# Patient Record
Sex: Female | Born: 1935 | Race: White | Hispanic: No | Smoking: Never smoker
Health system: Southern US, Community
[De-identification: ages and names within clinical notes are randomized; demographics above are authoritative.]

## PROBLEM LIST (undated history)

## (undated) DIAGNOSIS — K219 Gastro-esophageal reflux disease without esophagitis: Secondary | ICD-10-CM

## (undated) DIAGNOSIS — H35039 Hypertensive retinopathy, unspecified eye: Secondary | ICD-10-CM

## (undated) DIAGNOSIS — I639 Cerebral infarction, unspecified: Secondary | ICD-10-CM

## (undated) DIAGNOSIS — S329XXA Fracture of unspecified parts of lumbosacral spine and pelvis, initial encounter for closed fracture: Secondary | ICD-10-CM

## (undated) DIAGNOSIS — D649 Anemia, unspecified: Secondary | ICD-10-CM

## (undated) DIAGNOSIS — M419 Scoliosis, unspecified: Secondary | ICD-10-CM

## (undated) DIAGNOSIS — I341 Nonrheumatic mitral (valve) prolapse: Secondary | ICD-10-CM

## (undated) DIAGNOSIS — M810 Age-related osteoporosis without current pathological fracture: Secondary | ICD-10-CM

## (undated) DIAGNOSIS — E785 Hyperlipidemia, unspecified: Secondary | ICD-10-CM

## (undated) DIAGNOSIS — I10 Essential (primary) hypertension: Secondary | ICD-10-CM

## (undated) DIAGNOSIS — K589 Irritable bowel syndrome without diarrhea: Secondary | ICD-10-CM

## (undated) HISTORY — DX: Nonrheumatic mitral (valve) prolapse: I34.1

## (undated) HISTORY — DX: Hyperlipidemia, unspecified: E78.5

## (undated) HISTORY — DX: Gastro-esophageal reflux disease without esophagitis: K21.9

## (undated) HISTORY — PX: ROTATOR CUFF REPAIR: SHX139

## (undated) HISTORY — DX: Irritable bowel syndrome, unspecified: K58.9

## (undated) HISTORY — PX: EYE SURGERY: SHX253

## (undated) HISTORY — DX: Age-related osteoporosis without current pathological fracture: M81.0

## (undated) HISTORY — DX: Scoliosis, unspecified: M41.9

## (undated) HISTORY — PX: APPENDECTOMY: SHX54

---

## 1963-11-14 HISTORY — PX: ABDOMINAL HYSTERECTOMY: SHX81

## 1979-11-14 HISTORY — PX: BILATERAL OOPHORECTOMY: SHX1221

## 1999-08-29 ENCOUNTER — Other Ambulatory Visit: Admission: RE | Admit: 1999-08-29 | Discharge: 1999-08-29 | Payer: Self-pay | Admitting: Cardiology

## 2004-05-17 ENCOUNTER — Other Ambulatory Visit: Admission: RE | Admit: 2004-05-17 | Discharge: 2004-05-17 | Payer: Self-pay | Admitting: *Deleted

## 2010-09-14 ENCOUNTER — Ambulatory Visit: Payer: Self-pay | Admitting: Cardiology

## 2011-04-17 ENCOUNTER — Other Ambulatory Visit: Payer: Self-pay | Admitting: Cardiology

## 2011-04-17 DIAGNOSIS — E785 Hyperlipidemia, unspecified: Secondary | ICD-10-CM

## 2011-04-17 DIAGNOSIS — I119 Hypertensive heart disease without heart failure: Secondary | ICD-10-CM

## 2011-04-17 DIAGNOSIS — Z79899 Other long term (current) drug therapy: Secondary | ICD-10-CM

## 2011-04-17 MED ORDER — SIMVASTATIN 20 MG PO TABS: 20.0000 mg | ORAL_TABLET | Freq: Every day | ORAL | Status: DC

## 2011-04-17 MED ORDER — NADOLOL 80 MG PO TABS: 80.0000 mg | ORAL_TABLET | Freq: Every day | ORAL | Status: DC

## 2011-04-17 NOTE — Telephone Encounter (Signed)
NEEDS SCRIPT FOR ZORCOR AND CORGARD - GENERIC FOR BOTH- WANTS TO PICK UP SCRIPTS. CALL WHEN READY

## 2011-05-15 ENCOUNTER — Encounter: Payer: Self-pay | Admitting: Cardiology

## 2011-05-24 ENCOUNTER — Encounter: Payer: Self-pay | Admitting: Cardiology

## 2011-05-24 ENCOUNTER — Other Ambulatory Visit (INDEPENDENT_AMBULATORY_CARE_PROVIDER_SITE_OTHER): Payer: Medicare Other | Admitting: *Deleted

## 2011-05-24 ENCOUNTER — Ambulatory Visit (INDEPENDENT_AMBULATORY_CARE_PROVIDER_SITE_OTHER): Payer: Medicare Other | Admitting: Cardiology

## 2011-05-24 DIAGNOSIS — E785 Hyperlipidemia, unspecified: Secondary | ICD-10-CM

## 2011-05-24 DIAGNOSIS — R5381 Other malaise: Secondary | ICD-10-CM

## 2011-05-24 DIAGNOSIS — I059 Rheumatic mitral valve disease, unspecified: Secondary | ICD-10-CM

## 2011-05-24 DIAGNOSIS — I341 Nonrheumatic mitral (valve) prolapse: Secondary | ICD-10-CM | POA: Insufficient documentation

## 2011-05-24 DIAGNOSIS — Z79899 Other long term (current) drug therapy: Secondary | ICD-10-CM

## 2011-05-24 DIAGNOSIS — E7800 Pure hypercholesterolemia, unspecified: Secondary | ICD-10-CM

## 2011-05-24 LAB — BASIC METABOLIC PANEL
Calcium: 9.2 mg/dL (ref 8.4–10.5)
GFR: 107.68 mL/min (ref >60.00)
Glucose, Bld: 101 mg/dL — ABNORMAL HIGH (ref 70–99)
Sodium: 140 mEq/L (ref 135–145)

## 2011-05-24 LAB — LIPID PANEL
HDL: 87.5 mg/dL (ref >39.00)
Total CHOL/HDL Ratio: 2
VLDL: 11.2 mg/dL (ref 0.0–40.0)

## 2011-05-24 LAB — HEPATIC FUNCTION PANEL
Albumin: 4.6 g/dL (ref 3.5–5.2)
Alkaline Phosphatase: 61 U/L (ref 39–117)

## 2011-05-24 LAB — CBC WITH DIFFERENTIAL/PLATELET
Eosinophils Relative: 0.6 % (ref 0.0–5.0)
HCT: 39.6 % (ref 36.0–46.0)
Hemoglobin: 13.5 g/dL (ref 12.0–15.0)
Lymphs Abs: 2 10*3/uL (ref 0.7–4.0)
Monocytes Relative: 7.1 % (ref 3.0–12.0)
Neutro Abs: 4.6 10*3/uL (ref 1.4–7.7)
RDW: 14.6 % (ref 11.5–14.6)
WBC: 7.2 10*3/uL (ref 4.5–10.5)

## 2011-05-24 NOTE — Assessment & Plan Note (Signed)
The patient has a history of hypercholesterolemia.  She is on low dose simvastatin20 mg daily.  She is not having any problems or myalgias or other side effects from the statins.  She is chronically thin and does not over eat.

## 2011-05-24 NOTE — Progress Notes (Signed)
Akayla A Savin Date of Birth:  01/26/1934 [facility] [address]   [address] [place], [place]  [address] [phone]           Fax   [phone]  HPI: This pleasant 34 woman is seen for a followup office visit.  She has a history of mitral valve prolapse and history of hypercholesterolemia.  She also has a history of osteoporosis.  She previously took Fosamax but not recently.  She had a fall in December 2011 and unfortunately did not break any bones.  From a cardiac standpoint she's having no chest pain or shortness of breath or palpitations.  She does not have any history of ischemic heart disease.  She has not been experiencing any side effects from her statin therapy.  Current Outpatient Prescriptions  Medication Sig Dispense Refill  . aspirin 81 MG EC tablet Take 81 mg by mouth daily.        . Calcium Carb-Cholecalciferol (CALCIUM 500 +D PO) Take by mouth 2 (two) times daily.        . nadolol (CORGARD) 80 MG tablet Take 1 tablet (80 mg total) by mouth daily.  90 tablet  3  . simvastatin (ZOCOR) 20 MG tablet Take 1 tablet (20 mg total) by mouth daily.  90 tablet  3    Allergies  Allergen Reactions  . Corgard (Nadolol)   . Penicillins     Yeast infections  . Phenothiazines   . Tetracyclines & Related     Patient Active Problem List  Diagnoses  . Mitral valve prolapse  . Hypercholesterolemia    History  Smoking status  . Never Smoker   Smokeless tobacco  . Not on file    History  Alcohol Use No    Family History  Problem Relation Age of Onset  . Heart attack Mother   . Heart disease Father     Review of Systems: The patient denies any heat or cold intolerance.  No weight gain or weight loss.  The patient denies headaches or blurry vision.  There is no cough or sputum production.  The patient denies dizziness.  There is no hematuria or hematochezia.  The patient denies any muscle aches or arthritis.  The patient denies any rash.  The  patient denies frequent falling or instability.  There is no history of depression or anxiety.  All other systems were reviewed and are negative.   Physical Exam: Filed Vitals:   05/24/11 1025  BP: 110/72  Pulse: 64  The general appearance reveals a thin woman in no acute distress.  She does have scoliosis.  Pupils equal and reactive.   Extraocular Movements are full.  There is no scleral icterus.  The mouth and pharynx are normal.  The neck is supple.  The carotids reveal no bruits.  The jugular venous pressure is normal.  The thyroid is not enlarged.  There is no lymphadenopathy.The chest is clear to percussion and auscultation. There are no rales or rhonchi. Expansion of the chest is symmetrical.The heart reveals a sharp midsystolic click.The abdomen is soft and nontender. Bowel sounds are normal. The liver and spleen are not enlarged. There Are no abdominal masses. There are no bruits.  Normal extremity without phlebitis or edema.  Pedal pulses are good.Strength is normal and symmetrical in all extremities.  There is no lateralizing weakness.  There are no sensory deficits.The skin is warm and dry.  There is no rash.    Assessment /  Plan:  Blood work was drawn today, results pending.  Continue same medication.  Recheck in 6 months for followup office Visit and fasting lab work including a CBC

## 2011-05-24 NOTE — Assessment & Plan Note (Signed)
The patient has mitral valve prolapse with a loud click.  She is on Nadolol 80 mg daily.  She has not been expressing any chest pain or shortness of breath or palpitations.

## 2011-05-25 ENCOUNTER — Telehealth: Payer: Self-pay | Admitting: *Deleted

## 2011-05-25 NOTE — Telephone Encounter (Signed)
Advised patient of labs 

## 2011-12-06 ENCOUNTER — Encounter: Payer: Self-pay | Admitting: Cardiology

## 2011-12-06 ENCOUNTER — Other Ambulatory Visit (INDEPENDENT_AMBULATORY_CARE_PROVIDER_SITE_OTHER): Payer: Medicare Other | Admitting: *Deleted

## 2011-12-06 ENCOUNTER — Ambulatory Visit (INDEPENDENT_AMBULATORY_CARE_PROVIDER_SITE_OTHER): Payer: Medicare Other | Admitting: Cardiology

## 2011-12-06 VITALS — BP 120/70 | HR 50 | Ht 60.0 in | Wt 81.0 lb

## 2011-12-06 DIAGNOSIS — E7800 Pure hypercholesterolemia, unspecified: Secondary | ICD-10-CM

## 2011-12-06 DIAGNOSIS — I341 Nonrheumatic mitral (valve) prolapse: Secondary | ICD-10-CM

## 2011-12-06 DIAGNOSIS — I059 Rheumatic mitral valve disease, unspecified: Secondary | ICD-10-CM

## 2011-12-06 DIAGNOSIS — R5383 Other fatigue: Secondary | ICD-10-CM

## 2011-12-06 DIAGNOSIS — Z79899 Other long term (current) drug therapy: Secondary | ICD-10-CM

## 2011-12-06 DIAGNOSIS — R5381 Other malaise: Secondary | ICD-10-CM

## 2011-12-06 LAB — CBC WITH DIFFERENTIAL/PLATELET
Basophils Relative: 0.4 % (ref 0.0–3.0)
Eosinophils Relative: 1.9 % (ref 0.0–5.0)
HCT: 40.3 % (ref 36.0–46.0)
Hemoglobin: 13.5 g/dL (ref 12.0–15.0)
Lymphs Abs: 2.1 10*3/uL (ref 0.7–4.0)
MCV: 92.4 fl (ref 78.0–100.0)
Monocytes Absolute: 0.6 10*3/uL (ref 0.1–1.0)
Monocytes Relative: 9.3 % (ref 3.0–12.0)
Neutro Abs: 4 10*3/uL (ref 1.4–7.7)
Platelets: 215 10*3/uL (ref 150.0–400.0)
WBC: 6.9 10*3/uL (ref 4.5–10.5)

## 2011-12-06 LAB — BASIC METABOLIC PANEL
BUN: 21 mg/dL (ref 6–23)
CO2: 28 mEq/L (ref 19–32)
Calcium: 9.3 mg/dL (ref 8.4–10.5)
Creatinine, Ser: 0.7 mg/dL (ref 0.4–1.2)
GFR: 92.63 mL/min (ref >60.00)
Glucose, Bld: 90 mg/dL (ref 70–99)

## 2011-12-06 LAB — HEPATIC FUNCTION PANEL
ALT: 18 U/L (ref 0–35)
AST: 25 U/L (ref 0–37)
Bilirubin, Direct: 0 mg/dL (ref 0.0–0.3)
Total Bilirubin: 1 mg/dL (ref 0.3–1.2)

## 2011-12-06 LAB — LIPID PANEL: Total CHOL/HDL Ratio: 2

## 2011-12-06 NOTE — Progress Notes (Signed)
  Madison Odom Date of Birth:  07/13/1934 [facility] [address] [place], [place]  [address] [phone]  Fax   [phone]  HPI: This pleasant 78 year old woman is seen for a scheduled followup office visit.  His history of mitral valve prolapse and history of hypercholesterolemia and history of osteoporosis.  His last visit she's been doing well.  She's had no new cardiac symptoms.  She has developed a retinal problem and has seen her retinal specialist Dr. Allyne Gee.  He does not anticipate any need for surgery at this point.  Current Outpatient Prescriptions  Medication Sig Dispense Refill  . aspirin 81 MG EC tablet Take 81 mg by mouth daily.        . Calcium Carb-Cholecalciferol (CALCIUM 500 +D PO) Take by mouth daily.       . nadolol (CORGARD) 80 MG tablet Take 1 tablet (80 mg total) by mouth daily.  90 tablet  3  . simvastatin (ZOCOR) 20 MG tablet Take 1 tablet (20 mg total) by mouth daily.  90 tablet  3    Allergies  Allergen Reactions  . Corgard (Nadolol)   . Penicillins     Yeast infections  . Phenothiazines   . Tetracyclines & Related     Patient Active Problem List  Diagnoses  . Mitral valve prolapse  . Hypercholesterolemia    History  Smoking status  . Never Smoker   Smokeless tobacco  . Not on file    History  Alcohol Use No    Family History  Problem Relation Age of Onset  . Heart attack Mother   . Heart disease Father     Review of Systems: The patient denies any heat or cold intolerance.  No weight gain or weight loss.  The patient denies headaches or blurry vision.  There is no cough or sputum production.  The patient denies dizziness.  There is no hematuria or hematochezia.  The patient denies any muscle aches or arthritis.  The patient denies any rash.  The patient denies frequent falling or instability.  There is no history of depression or anxiety.  All other systems were reviewed and are negative.   Physical  Exam: Filed Vitals:   12/06/11 0901  BP: 120/70  Pulse: 50   the general appearance reveals a well-developed well-nourished woman in no distress.Pupils equal and reactive.   Extraocular Movements are full.  There is no scleral icterus.  The mouth and pharynx are normal.  The neck is supple.  The carotids reveal no bruits.  The jugular venous pressure is normal.  The thyroid is not enlarged.  There is no lymphadenopathy.  The chest is clear to percussion and auscultation. There are no rales or rhonchi. Expansion of the chest is symmetrical.  There is significant kyphoscoliosis from osteoporosis.  Cardiac exam reveals a sharp midsystolic click.The abdomen is soft and nontender. Bowel sounds are normal. The liver and spleen are not enlarged. There Are no abdominal masses. There are no bruits.  The pedal pulses are good.  There is no phlebitis or edema.  There is no cyanosis or clubbing. Strength is normal and symmetrical in all extremities.  There is no lateralizing weakness.  There are no sensory deficits.      Assessment / Plan: 8 today's lab.  Recheck in 6 months for followup office visit EKG and fasting lab work.  Continue same medications for now.

## 2011-12-06 NOTE — Patient Instructions (Signed)
Will obtain labs today and call you with the results (cbc,lp,bmet,hfp) Your physician recommends that you continue on your current medications as directed. Please refer to the Current Medication list given to you today. Your physician wants you to follow-up in: 6 months You will receive a reminder letter in the mail two months in advance. If you don't receive a letter, please call our office to schedule the follow-up appointment.

## 2011-12-06 NOTE — Assessment & Plan Note (Signed)
The patient is on simvastatin which she is tolerating without side effects.  Blood work today is pending.

## 2011-12-06 NOTE — Assessment & Plan Note (Signed)
No chest pain or shortness of breath.  No palpitations.   No chest pain or shortness of breath.  No palpitations

## 2011-12-08 ENCOUNTER — Telehealth: Payer: Self-pay | Admitting: *Deleted

## 2011-12-08 NOTE — Telephone Encounter (Signed)
Mailed copy of labs and left message to call if any questions  

## 2011-12-08 NOTE — Telephone Encounter (Signed)
Message copied by Burnell Blanks on Fri Dec 08, 2011  9:54 AM ------      Message from: Cassell Clement      Created: Thu Dec 07, 2011  3:09 PM       Please report.  The labs are stable.  Continue same meds.  Continue careful diet.

## 2012-04-16 ENCOUNTER — Other Ambulatory Visit: Payer: Self-pay | Admitting: Cardiology

## 2012-04-17 NOTE — Telephone Encounter (Signed)
Refilled nadolol

## 2012-04-30 DIAGNOSIS — H35379 Puckering of macula, unspecified eye: Secondary | ICD-10-CM | POA: Insufficient documentation

## 2012-04-30 DIAGNOSIS — H35359 Cystoid macular degeneration, unspecified eye: Secondary | ICD-10-CM | POA: Insufficient documentation

## 2012-05-20 ENCOUNTER — Other Ambulatory Visit: Payer: Self-pay | Admitting: Cardiology

## 2012-05-20 MED ORDER — SIMVASTATIN 20 MG PO TABS: 20.0000 mg | ORAL_TABLET | Freq: Every day | ORAL | Status: DC

## 2012-05-20 NOTE — Telephone Encounter (Signed)
Fax Received. Refill Completed. Madison Odom (R.M.A)   

## 2012-09-18 ENCOUNTER — Encounter: Payer: Self-pay | Admitting: Cardiology

## 2012-09-18 ENCOUNTER — Ambulatory Visit (INDEPENDENT_AMBULATORY_CARE_PROVIDER_SITE_OTHER): Payer: Medicare Other | Admitting: Cardiology

## 2012-09-18 ENCOUNTER — Telehealth: Payer: Self-pay | Admitting: *Deleted

## 2012-09-18 VITALS — BP 116/70 | HR 60 | Ht 60.0 in | Wt 78.0 lb

## 2012-09-18 DIAGNOSIS — I059 Rheumatic mitral valve disease, unspecified: Secondary | ICD-10-CM

## 2012-09-18 DIAGNOSIS — I341 Nonrheumatic mitral (valve) prolapse: Secondary | ICD-10-CM

## 2012-09-18 DIAGNOSIS — E7800 Pure hypercholesterolemia, unspecified: Secondary | ICD-10-CM

## 2012-09-18 LAB — LIPID PANEL
Cholesterol: 169 mg/dL (ref 0–200)
LDL Cholesterol: 83 mg/dL (ref 0–99)
Triglycerides: 72 mg/dL (ref 0.0–149.0)

## 2012-09-18 LAB — BASIC METABOLIC PANEL
BUN: 18 mg/dL (ref 6–23)
Chloride: 102 mEq/L (ref 96–112)
Creatinine, Ser: 0.6 mg/dL (ref 0.4–1.2)
Glucose, Bld: 89 mg/dL (ref 70–99)
Potassium: 4.5 mEq/L (ref 3.5–5.1)

## 2012-09-18 LAB — HEPATIC FUNCTION PANEL
ALT: 19 U/L (ref 0–35)
AST: 30 U/L (ref 0–37)
Albumin: 4 g/dL (ref 3.5–5.2)
Total Bilirubin: 0.6 mg/dL (ref 0.3–1.2)
Total Protein: 7.1 g/dL (ref 6.0–8.3)

## 2012-09-18 NOTE — Telephone Encounter (Signed)
Advised patient of lab results  

## 2012-09-18 NOTE — Telephone Encounter (Signed)
Message copied by Burnell Blanks on Wed Sep 18, 2012  5:21 PM ------      Message from: Cassell Clement      Created: Wed Sep 18, 2012  4:25 PM       Please report to patient.  The recent labs are stable. Continue same medication and careful diet.

## 2012-09-18 NOTE — Progress Notes (Signed)
Madison Odom Date of Birth:  04-18-1934 [facility] [address] [place], [place]  [address] [phone]  Fax   [phone]  HPI: This pleasant 78 year old woman is seen for a scheduled followup office visit.  She has a history of mitral valve prolapse and history of hypercholesterolemia and history of osteoporosis.  Since her last visit she's been doing well. She's had no new cardiac symptoms. She has developed a retinal problem and has seen her retinal specialist Dr. Allyne Gee. He does not anticipate any need for surgery at this point.   Current Outpatient Prescriptions  Medication Sig Dispense Refill  . aspirin 81 MG EC tablet Take 81 mg by mouth daily.        . Calcium Carb-Cholecalciferol (CALCIUM 500 +D PO) Take by mouth daily.       . nadolol (CORGARD) 80 MG tablet TAKE 1 TABLET DAILY  90 tablet  3  . simvastatin (ZOCOR) 20 MG tablet Take 1 tablet (20 mg total) by mouth daily.  90 tablet  3  . [DISCONTINUED] simvastatin (ZOCOR) 20 MG tablet Take 1 tablet (20 mg total) by mouth daily.  90 tablet  3    Allergies  Allergen Reactions  . Penicillins     Yeast infections  . Phenothiazines   . Tetracyclines & Related     Patient Active Problem List  Diagnosis  . Mitral valve prolapse  . Hypercholesterolemia    History  Smoking status  . Never Smoker   Smokeless tobacco  . Not on file    History  Alcohol Use No    Family History  Problem Relation Age of Onset  . Heart attack Mother   . Heart disease Father     Review of Systems: The patient denies any heat or cold intolerance.  No weight gain or weight loss.  The patient denies headaches or blurry vision.  There is no cough or sputum production.  The patient denies dizziness.  There is no hematuria or hematochezia.  The patient denies any muscle aches or arthritis.  The patient denies any rash.  The patient denies frequent falling or instability.  There is no history of depression or  anxiety.  All other systems were reviewed and are negative.   Physical Exam: Filed Vitals:   09/18/12 1102  BP: 116/70  Pulse: 60   general appearance reveals a thin elderly woman in no distress.  She has marked kyphoscoliosis.The head and neck exam reveals pupils equal and reactive.  Extraocular movements are full.  There is no scleral icterus.  The mouth and pharynx are normal.  The neck is supple.  The carotids reveal no bruits.  The jugular venous pressure is normal.  The  thyroid is not enlarged.  There is no lymphadenopathy.  The chest is clear to percussion and auscultation.  There are no rales or rhonchi.  Expansion of the chest is symmetrical.  The precordium is quiet.  The first heart sound is normal.  The second heart sound is physiologically split.  There is a prominent mid systolic apical click.  There is no abnormal lift or heave.  The abdomen is soft and nontender.  The bowel sounds are normal.  The liver and spleen are not enlarged.  There are no abdominal masses.  There are no abdominal bruits.  Extremities reveal good pedal pulses.  There is no phlebitis or edema.  There is no cyanosis or clubbing.  Strength is normal and symmetrical in all extremities.  There is no lateralizing weakness.  There are no sensory deficits.  The skin is warm and dry.  There is no rash.   EKG shows sinus bradycardia and no ischemic changes.  No old tracings to compare   Assessment / Plan: Continue same medication.  Blood work is pending.  Recheck in 6 months for followup office visit and fasting lab work

## 2012-09-18 NOTE — Patient Instructions (Addendum)
Will obtain labs today and call you with the results (lp/bmet/hfp)  Your physician recommends that you continue on your current medications as directed. Please refer to the Current Medication list given to you today.  Your physician wants you to follow-up in: 6 months with fasting labs (lp/bmet/hfp)  You will receive a reminder letter in the mail two months in advance. If you don't receive a letter, please call our office to schedule the follow-up appointment.  

## 2012-09-18 NOTE — Progress Notes (Signed)
Quick Note:  Please report to patient. The recent labs are stable. Continue same medication and careful diet. ______ 

## 2012-09-18 NOTE — Assessment & Plan Note (Signed)
She continues to tolerate her simvastatin without side effects.  She is on a careful diet.  We do not want her to lose any more weight.  Her weight today is down 3 pounds

## 2012-09-18 NOTE — Assessment & Plan Note (Signed)
Patient has not been having any chest pain or palpitations or dyspnea.  She walks on her treadmill at home for exercise.  She denies dizziness or syncope

## 2012-10-29 DIAGNOSIS — H26499 Other secondary cataract, unspecified eye: Secondary | ICD-10-CM | POA: Insufficient documentation

## 2012-12-28 ENCOUNTER — Other Ambulatory Visit: Payer: Self-pay

## 2013-01-22 DIAGNOSIS — Z961 Presence of intraocular lens: Secondary | ICD-10-CM | POA: Insufficient documentation

## 2013-02-19 DIAGNOSIS — H2640 Unspecified secondary cataract: Secondary | ICD-10-CM | POA: Insufficient documentation

## 2013-03-19 ENCOUNTER — Encounter: Payer: Self-pay | Admitting: Cardiology

## 2013-03-19 ENCOUNTER — Ambulatory Visit (INDEPENDENT_AMBULATORY_CARE_PROVIDER_SITE_OTHER): Payer: Medicare Other | Admitting: Cardiology

## 2013-03-19 VITALS — BP 140/54 | HR 67 | Ht <= 58 in | Wt 79.0 lb

## 2013-03-19 DIAGNOSIS — I341 Nonrheumatic mitral (valve) prolapse: Secondary | ICD-10-CM

## 2013-03-19 DIAGNOSIS — I059 Rheumatic mitral valve disease, unspecified: Secondary | ICD-10-CM

## 2013-03-19 DIAGNOSIS — E7800 Pure hypercholesterolemia, unspecified: Secondary | ICD-10-CM

## 2013-03-19 LAB — LIPID PANEL
HDL: 78.6 mg/dL (ref >39.00)
LDL Cholesterol: 72 mg/dL (ref 0–99)
Total CHOL/HDL Ratio: 2
Triglycerides: 57 mg/dL (ref 0.0–149.0)
VLDL: 11.4 mg/dL (ref 0.0–40.0)

## 2013-03-19 LAB — HEPATIC FUNCTION PANEL
AST: 29 U/L (ref 0–37)
Albumin: 4 g/dL (ref 3.5–5.2)
Total Bilirubin: 0.6 mg/dL (ref 0.3–1.2)

## 2013-03-19 LAB — BASIC METABOLIC PANEL
CO2: 29 mEq/L (ref 19–32)
Calcium: 9.3 mg/dL (ref 8.4–10.5)
Chloride: 103 mEq/L (ref 96–112)
Glucose, Bld: 96 mg/dL (ref 70–99)
Potassium: 5.3 mEq/L — ABNORMAL HIGH (ref 3.5–5.1)
Sodium: 138 mEq/L (ref 135–145)

## 2013-03-19 NOTE — Assessment & Plan Note (Signed)
The patient has a history of hypercholesterolemia.  She is on simvastatin 20 mg daily.  She is not having any myalgias

## 2013-03-19 NOTE — Assessment & Plan Note (Signed)
The patient has been under more stress because of her husband's recent death from a stroke .  Despite her recent situational problems her heart has been doing well with no new cardiac symptoms.  Specifically no chest pain or shortness of breath

## 2013-03-19 NOTE — Progress Notes (Signed)
Quick Note:  Please report to patient. The recent labs are stable. Continue same medication and careful diet. ______ 

## 2013-03-19 NOTE — Progress Notes (Signed)
Madison Odom Date of Birth:  22-Nov-1933 [facility] [address] [place], [place]  [address] [phone]  Fax   [phone]  HPI: This pleasant 79 year old woman is seen for a scheduled followup office visit. She has a history of mitral valve prolapse and history of hypercholesterolemia and history of osteoporosis. Since her last visit she's been doing well. She's had no new cardiac symptoms. She has developed a retinal problem and has seen her retinal specialist Dr. Allyne Gee. He does not anticipate any need for surgery at this point. And since we last saw her, her husband died of a stroke.   Current Outpatient Prescriptions  Medication Sig Dispense Refill  . aspirin 81 MG EC tablet Take 81 mg by mouth daily.        . Calcium Carb-Cholecalciferol (CALCIUM 500 +D PO) Take by mouth daily.       . nadolol (CORGARD) 80 MG tablet TAKE 1 TABLET DAILY  90 tablet  3  . simvastatin (ZOCOR) 20 MG tablet Take 1 tablet (20 mg total) by mouth daily.  90 tablet  3   No current facility-administered medications for this visit.    Allergies  Allergen Reactions  . Penicillins     Yeast infections  . Phenothiazines   . Tetracyclines & Related     Patient Active Problem List   Diagnosis Date Noted  . Mitral valve prolapse 05/24/2011  . Hypercholesterolemia 05/24/2011    History  Smoking status  . Never Smoker   Smokeless tobacco  . Not on file    History  Alcohol Use No    Family History  Problem Relation Age of Onset  . Heart attack Mother   . Heart disease Father     Review of Systems: The patient denies any heat or cold intolerance.  No weight gain or weight loss.  The patient denies headaches or blurry vision.  There is no cough or sputum production.  The patient denies dizziness.  There is no hematuria or hematochezia.  The patient denies any muscle aches or arthritis.  The patient denies any rash.  The patient denies frequent falling or instability.   There is no history of depression or anxiety.  All other systems were reviewed and are negative.   Physical Exam: Filed Vitals:   03/19/13 0956  BP: 140/54  Pulse: 67   the general appearance reveals a well-developed well-nourished woman in no distress.  Blood pressure slightly higher today because of recent stress.The head and neck exam reveals pupils equal and reactive.  Extraocular movements are full.  There is no scleral icterus.  The mouth and pharynx are normal.  The neck is supple.  The carotids reveal no bruits.  The jugular venous pressure is normal.  The  thyroid is not enlarged.  There is no lymphadenopathy.  The chest is clear to percussion and auscultation.  There are no rales or rhonchi.  Expansion of the chest is symmetrical.  The precordium is quiet.  The first heart sound is normal.  The second heart sound is physiologically split.  There is a late systolic click and murmur at apex.  There is no abnormal lift or heave.  The abdomen is soft and nontender.  The bowel sounds are normal.  The liver and spleen are not enlarged.  There are no abdominal masses.  There are no abdominal bruits.  Extremities reveal good pedal pulses.  There is no phlebitis or edema.  There is no cyanosis or clubbing.  Strength is normal and symmetrical in all extremities.  There is no lateralizing weakness.  There are no sensory deficits.  The skin is warm and dry.  There is no rash.      Assessment / Plan:  Continue same medication.  Recheck in 6 months for followup office visit lipid panel and hepatic function panel and basal metabolic panel. Fortunately both of her daughters live nearby and are able to help her during this period of bereavement.

## 2013-03-19 NOTE — Patient Instructions (Addendum)
Will obtain labs today and call you with the results (LP/BMET/HFP)  Your physician recommends that you continue on your current medications as directed. Please refer to the Current Medication list given to you today.  Your physician wants you to follow-up in: 6 months with fasting labs (lp/bmet/hfp)  You will receive a reminder letter in the mail two months in advance. If you don't receive a letter, please call our office to schedule the follow-up appointment.  

## 2013-05-06 ENCOUNTER — Other Ambulatory Visit: Payer: Self-pay | Admitting: Cardiology

## 2013-06-18 ENCOUNTER — Other Ambulatory Visit: Payer: Self-pay

## 2013-07-15 ENCOUNTER — Other Ambulatory Visit: Payer: Self-pay | Admitting: Cardiology

## 2013-09-16 ENCOUNTER — Encounter: Payer: Self-pay | Admitting: Cardiology

## 2013-09-16 ENCOUNTER — Ambulatory Visit (INDEPENDENT_AMBULATORY_CARE_PROVIDER_SITE_OTHER): Payer: Medicare Other | Admitting: Cardiology

## 2013-09-16 VITALS — BP 140/82 | HR 63 | Ht <= 58 in | Wt 81.0 lb

## 2013-09-16 DIAGNOSIS — E7800 Pure hypercholesterolemia, unspecified: Secondary | ICD-10-CM

## 2013-09-16 DIAGNOSIS — I059 Rheumatic mitral valve disease, unspecified: Secondary | ICD-10-CM

## 2013-09-16 DIAGNOSIS — M4120 Other idiopathic scoliosis, site unspecified: Secondary | ICD-10-CM

## 2013-09-16 DIAGNOSIS — M419 Scoliosis, unspecified: Secondary | ICD-10-CM

## 2013-09-16 DIAGNOSIS — I341 Nonrheumatic mitral (valve) prolapse: Secondary | ICD-10-CM

## 2013-09-16 LAB — BASIC METABOLIC PANEL
BUN: 19 mg/dL (ref 6–23)
Calcium: 9.3 mg/dL (ref 8.4–10.5)
Chloride: 101 mEq/L (ref 96–112)
GFR: 97.28 mL/min (ref >60.00)
Glucose, Bld: 95 mg/dL (ref 70–99)
Potassium: 4.7 mEq/L (ref 3.5–5.1)

## 2013-09-16 LAB — LIPID PANEL
Cholesterol: 172 mg/dL (ref 0–200)
LDL Cholesterol: 78 mg/dL (ref 0–99)
Triglycerides: 70 mg/dL (ref 0.0–149.0)

## 2013-09-16 LAB — HEPATIC FUNCTION PANEL
AST: 28 U/L (ref 0–37)
Albumin: 3.9 g/dL (ref 3.5–5.2)
Alkaline Phosphatase: 55 U/L (ref 39–117)
Bilirubin, Direct: 0.1 mg/dL (ref 0.0–0.3)
Total Bilirubin: 0.5 mg/dL (ref 0.3–1.2)
Total Protein: 6.7 g/dL (ref 6.0–8.3)

## 2013-09-16 NOTE — Progress Notes (Signed)
Madison Odom Date of Birth:  [birth year] [address] [place], [place]  [address] [phone]  Fax   [phone]  HPI: This pleasant 79 year old woman is seen for a scheduled followup office visit. She has a history of mitral valve prolapse and history of hypercholesterolemia and history of osteoporosis. Since her last visit she's been doing well. She's had no new cardiac symptoms.  She became dehydrated [place] and had a fainting spell.  The paramedics came to the home and checked her out.  She did not have to go to the hospital.  Since then she has made an effort to avoid dehydration and she is trying to drink more water.  Current Outpatient Prescriptions  Medication Sig Dispense Refill  . aspirin 81 MG EC tablet Take 81 mg by mouth daily.        . Calcium Carb-Cholecalciferol (CALCIUM 500 +D PO) Take by mouth daily.       . nadolol (CORGARD) 80 MG tablet TAKE 1 TABLET DAILY  90 tablet  3  . simvastatin (ZOCOR) 20 MG tablet TAKE 1 TABLET DAILY  90 tablet  3   No current facility-administered medications for this visit.    Allergies  Allergen Reactions  . Penicillins     Yeast infections  . Phenothiazines   . Tetracyclines & Related     Patient Active Problem List   Diagnosis Date Noted  . Mitral valve prolapse 05/24/2011  . Hypercholesterolemia 05/24/2011    History  Smoking status  . Never Smoker   Smokeless tobacco  . Not on file    History  Alcohol Use No    Family History  Problem Relation Age of Onset  . Heart attack Mother   . Heart disease Father     Review of Systems: The patient denies any heat or cold intolerance.  No weight gain or weight loss.  The patient denies headaches or blurry vision.  There is no cough or sputum production.  The patient denies dizziness.  There is no hematuria or hematochezia.  The patient denies any muscle aches or arthritis.  The patient denies any rash.   The patient denies frequent falling or instability.  There is no history of depression or anxiety.  All other systems were reviewed and are negative.   Physical Exam: Filed Vitals:   09/16/13 0856  BP: 140/82  Pulse: 63   the general appearance reveals a well-developed well-nourished woman in no distress.  Blood pressure slightly higher today because of recent stress.The head and neck exam reveals pupils equal and reactive.  Extraocular movements are full.  There is no scleral icterus.  The mouth and pharynx are normal.  The neck is supple.  The carotids reveal no bruits.  The jugular venous pressure is normal.  The  thyroid is not enlarged.  There is no lymphadenopathy.  The chest is clear to percussion and auscultation.  There are no rales or rhonchi.  Expansion of the chest is symmetrical.  The precordium is quiet.  The first heart sound is normal.  The second heart sound is physiologically split.  There is a late systolic click and murmur at apex.  There is no abnormal lift or heave.  The abdomen is soft and nontender.  The bowel sounds are normal.  The liver and spleen are not enlarged.  There are no abdominal masses.  There are no abdominal bruits.  Extremities reveal good pedal  pulses.  There is no phlebitis or edema.  There is no cyanosis or clubbing.  Strength is normal and symmetrical in all extremities.  There is no lateralizing weakness.  There are no sensory deficits.  The skin is warm and dry.  There is no rash.   EKG today shows normal sinus rhythm at 63 per minute and is within normal limits   Assessment / Plan:  Continue same medication.  Recheck in 6 months for followup office visit lipid panel and hepatic function panel and basal metabolic panel.

## 2013-09-16 NOTE — Progress Notes (Signed)
Quick Note:  Please report to patient. The recent labs are stable. Continue same medication and careful diet. ______ 

## 2013-09-16 NOTE — Assessment & Plan Note (Signed)
The patient is taking calcium and vitamin D.  She is trying to walk daily for exercise.

## 2013-09-16 NOTE — Patient Instructions (Signed)
Will obtain labs today and call you with the results (lp/bmet/hfp)  Your physician recommends that you continue on your current medications as directed. Please refer to the Current Medication list given to you today.  Your physician wants you to follow-up in: 6 months with fasting labs (lp/bmet/hfp)  You will receive a reminder letter in the mail two months in advance. If you don't receive a letter, please call our office to schedule the follow-up appointment.  

## 2013-09-16 NOTE — Assessment & Plan Note (Signed)
The patient has a history of hypercholesterolemia and is on simvastatin.  We are checking blood work today.  She is not having any myalgias.

## 2013-09-16 NOTE — Assessment & Plan Note (Signed)
The patient is not having any chest pain or shortness of breath with exertion.  No awareness of palpitations or tachycardia

## 2013-09-16 NOTE — Addendum Note (Signed)
Addended by: Williemae Area on: 09/16/2013 09:28 AM   Modules accepted: Orders

## 2013-09-17 ENCOUNTER — Telehealth: Payer: Self-pay | Admitting: *Deleted

## 2013-09-17 NOTE — Telephone Encounter (Signed)
Message copied by Burnell Blanks on Wed Sep 17, 2013 11:46 AM ------      Message from: Cassell Clement      Created: Tue Sep 16, 2013  4:12 PM       Please report to patient.  The recent labs are stable. Continue same medication and careful diet. ------

## 2013-09-17 NOTE — Telephone Encounter (Signed)
Advised patient of lab results  

## 2014-01-28 DIAGNOSIS — I10 Essential (primary) hypertension: Secondary | ICD-10-CM | POA: Insufficient documentation

## 2014-04-20 ENCOUNTER — Encounter: Payer: Self-pay | Admitting: Cardiology

## 2014-04-20 ENCOUNTER — Other Ambulatory Visit: Payer: Medicare Other

## 2014-04-20 ENCOUNTER — Ambulatory Visit (INDEPENDENT_AMBULATORY_CARE_PROVIDER_SITE_OTHER): Payer: Medicare Other | Admitting: Cardiology

## 2014-04-20 VITALS — BP 122/62 | HR 70 | Ht <= 58 in | Wt 86.0 lb

## 2014-04-20 DIAGNOSIS — M4120 Other idiopathic scoliosis, site unspecified: Secondary | ICD-10-CM

## 2014-04-20 DIAGNOSIS — M419 Scoliosis, unspecified: Secondary | ICD-10-CM

## 2014-04-20 DIAGNOSIS — I059 Rheumatic mitral valve disease, unspecified: Secondary | ICD-10-CM

## 2014-04-20 DIAGNOSIS — I341 Nonrheumatic mitral (valve) prolapse: Secondary | ICD-10-CM

## 2014-04-20 DIAGNOSIS — E7800 Pure hypercholesterolemia, unspecified: Secondary | ICD-10-CM

## 2014-04-20 LAB — LIPID PANEL
CHOL/HDL RATIO: 2
CHOLESTEROL: 148 mg/dL (ref 0–200)
HDL: 62.3 mg/dL (ref >39.00)
LDL Cholesterol: 76 mg/dL (ref 0–99)
NONHDL: 85.7
Triglycerides: 51 mg/dL (ref 0.0–149.0)
VLDL: 10.2 mg/dL (ref 0.0–40.0)

## 2014-04-20 LAB — HEPATIC FUNCTION PANEL
ALT: 15 U/L (ref 0–35)
AST: 23 U/L (ref 0–37)
Albumin: 3.5 g/dL (ref 3.5–5.2)
Alkaline Phosphatase: 53 U/L (ref 39–117)
BILIRUBIN DIRECT: 0.1 mg/dL (ref 0.0–0.3)
Total Bilirubin: 0.4 mg/dL (ref 0.2–1.2)
Total Protein: 5.8 g/dL — ABNORMAL LOW (ref 6.0–8.3)

## 2014-04-20 LAB — BASIC METABOLIC PANEL
BUN: 25 mg/dL — AB (ref 6–23)
CHLORIDE: 104 meq/L (ref 96–112)
CO2: 26 meq/L (ref 19–32)
Calcium: 9 mg/dL (ref 8.4–10.5)
Creatinine, Ser: 0.4 mg/dL (ref 0.4–1.2)
GFR: 164.06 mL/min (ref >60.00)
GLUCOSE: 84 mg/dL (ref 70–99)
POTASSIUM: 4.3 meq/L (ref 3.5–5.1)
Sodium: 136 mEq/L (ref 135–145)

## 2014-04-20 NOTE — Assessment & Plan Note (Signed)
She is on simvastatin.  She is not having any myalgias

## 2014-04-20 NOTE — Patient Instructions (Signed)
Will obtain labs today and call you with the results (LP/BMET/HFP)  Your physician recommends that you continue on your current medications as directed. Please refer to the Current Medication list given to you today.  Your physician wants you to follow-up in: 6 months with fasting labs (lp/bmet/hfp)  You will receive a reminder letter in the mail two months in advance. If you don't receive a letter, please call our office to schedule the follow-up appointment.  

## 2014-04-20 NOTE — Assessment & Plan Note (Signed)
She's not having a palpitations or chest pain

## 2014-04-20 NOTE — Progress Notes (Signed)
Madison Odom Date of Birth:  1934-03-25 [facility] [address] [place], [place]  [address] [phone]  Fax   [phone]  HPI: This pleasant 80 year old woman is seen for a scheduled followup office visit.  She has a history of mitral valve prolapse and history of hypercholesterolemia.  She has a history of scoliosis and osteoporosis.  Since last visit she has been doing well.  She has had no new cardiac symptoms.  As we last saw her she has had retinal surgery.  She got a pneumonia shot but had a bad reaction from it.  Current Outpatient Prescriptions  Medication Sig Dispense Refill  . aspirin 81 MG EC tablet Take 81 mg by mouth daily.        . Calcium Carb-Cholecalciferol (CALCIUM 500 +D PO) Take by mouth daily.       . nadolol (CORGARD) 80 MG tablet TAKE 1 TABLET DAILY  90 tablet  3  . simvastatin (ZOCOR) 20 MG tablet TAKE 1 TABLET DAILY  90 tablet  3   No current facility-administered medications for this visit.    Allergies  Allergen Reactions  . Penicillins     Yeast infections  . Phenothiazines   . Tetracyclines & Related     Patient Active Problem List   Diagnosis Date Noted  . Kyphoscoliosis 09/16/2013  . Mitral valve prolapse 05/24/2011  . Hypercholesterolemia 05/24/2011    History  Smoking status  . Never Smoker   Smokeless tobacco  . Not on file    History  Alcohol Use No    Family History  Problem Relation Age of Onset  . Heart attack Mother   . Heart disease Father     Review of Systems: The patient denies any heat or cold intolerance.  No weight gain or weight loss.  The patient denies headaches or blurry vision.  There is no cough or sputum production.  The patient denies dizziness.  There is no hematuria or hematochezia.  The patient denies any muscle aches or arthritis.  The patient denies any rash.  The patient denies frequent falling or instability.  There is no history of depression or anxiety.  All other  systems were reviewed and are negative.   Physical Exam: Filed Vitals:   04/20/14 1010  BP: 122/62  Pulse: 70   The general appearance reveals a well-developed thin woman in no acute distress.  She has significant scoliosis.The head and neck exam reveals pupils equal and reactive.  Extraocular movements are full.  There is no scleral icterus.  The mouth and pharynx are normal.  The neck is supple.  The carotids reveal no bruits.  The jugular venous pressure is normal.  The  thyroid is not enlarged.  There is no lymphadenopathy.  The chest is clear to percussion and auscultation.  There are no rales or rhonchi.  Expansion of the chest is symmetrical.  The precordium is quiet.  The first heart sound is normal.  The second heart sound is physiologically split.  There is a midsystolic click with late systolic murmur of mitral valve prolapse.  There is no abnormal lift or heave.  The abdomen is soft and nontender.  The bowel sounds are normal.  The liver and spleen are not enlarged.  There are no abdominal masses.  There are no abdominal bruits.  Extremities reveal good pedal pulses.  There is no phlebitis or edema.  There is no cyanosis or clubbing.  Strength is normal  and symmetrical in all extremities.  There is no lateralizing weakness.  There are no sensory deficits.  The skin is warm and dry.  There is no rash.     Assessment / Plan: 1.  Mitral valve prolapse 2.  Hypercholesterolemia 3.  Kyphoscoliosis  Plan: Lab work today pending.  Recheck in 6 months for followup office visit.  Since last visit she has gained 5 pounds which is good for her.  Recheck labs at next office visit and lipid panel hepatic function panel and basal metabolic panel.

## 2014-04-20 NOTE — Progress Notes (Signed)
Quick Note:  Please report to patient. The recent labs are stable. Continue same medication and careful diet. ______ 

## 2014-04-22 ENCOUNTER — Other Ambulatory Visit: Payer: Self-pay | Admitting: Cardiology

## 2014-07-03 ENCOUNTER — Other Ambulatory Visit: Payer: Self-pay | Admitting: *Deleted

## 2014-07-03 MED ORDER — SIMVASTATIN 20 MG PO TABS: ORAL_TABLET | ORAL | Status: DC

## 2014-10-04 ENCOUNTER — Other Ambulatory Visit: Payer: Self-pay

## 2014-10-04 MED ORDER — SIMVASTATIN 20 MG PO TABS: ORAL_TABLET | ORAL | Status: DC

## 2014-10-16 ENCOUNTER — Other Ambulatory Visit: Payer: Self-pay | Admitting: Cardiology

## 2014-10-17 NOTE — Telephone Encounter (Signed)
Rx was sent to pharmacy electronically. OV 11/19/2014

## 2014-10-20 ENCOUNTER — Ambulatory Visit: Payer: Medicare Other | Admitting: Cardiology

## 2014-10-21 ENCOUNTER — Ambulatory Visit: Payer: Medicare Other | Admitting: Cardiology

## 2014-11-19 ENCOUNTER — Ambulatory Visit (INDEPENDENT_AMBULATORY_CARE_PROVIDER_SITE_OTHER): Payer: Medicare Other | Admitting: Cardiology

## 2014-11-19 ENCOUNTER — Encounter: Payer: Self-pay | Admitting: Cardiology

## 2014-11-19 VITALS — BP 130/74 | HR 56 | Ht <= 58 in | Wt 82.8 lb

## 2014-11-19 DIAGNOSIS — E7800 Pure hypercholesterolemia, unspecified: Secondary | ICD-10-CM

## 2014-11-19 DIAGNOSIS — I341 Nonrheumatic mitral (valve) prolapse: Secondary | ICD-10-CM

## 2014-11-19 DIAGNOSIS — M419 Scoliosis, unspecified: Secondary | ICD-10-CM

## 2014-11-19 DIAGNOSIS — I059 Rheumatic mitral valve disease, unspecified: Secondary | ICD-10-CM

## 2014-11-19 LAB — BASIC METABOLIC PANEL WITH GFR
BUN: 27 mg/dL — ABNORMAL HIGH (ref 6–23)
CO2: 26 meq/L (ref 19–32)
Calcium: 9.2 mg/dL (ref 8.4–10.5)
Chloride: 104 meq/L (ref 96–112)
Creatinine, Ser: 0.6 mg/dL (ref 0.4–1.2)
GFR: 102.6 mL/min
Glucose, Bld: 94 mg/dL (ref 70–99)
Potassium: 4 meq/L (ref 3.5–5.1)
Sodium: 136 meq/L (ref 135–145)

## 2014-11-19 LAB — LIPID PANEL
Cholesterol: 167 mg/dL (ref 0–200)
HDL: 68.3 mg/dL
LDL Cholesterol: 86 mg/dL (ref 0–99)
NonHDL: 98.7
Total CHOL/HDL Ratio: 2
Triglycerides: 65 mg/dL (ref 0.0–149.0)
VLDL: 13 mg/dL (ref 0.0–40.0)

## 2014-11-19 LAB — HEPATIC FUNCTION PANEL
ALT: 16 U/L (ref 0–35)
AST: 24 U/L (ref 0–37)
Albumin: 4 g/dL (ref 3.5–5.2)
Alkaline Phosphatase: 83 U/L (ref 39–117)
Bilirubin, Direct: 0.1 mg/dL (ref 0.0–0.3)
Total Bilirubin: 0.7 mg/dL (ref 0.2–1.2)
Total Protein: 6.9 g/dL (ref 6.0–8.3)

## 2014-11-19 NOTE — Progress Notes (Signed)
Quick Note:  Please report to patient. The recent labs are stable. Continue same medication and careful diet. ______ 

## 2014-11-19 NOTE — Progress Notes (Signed)
Madison Odom Date of Birth:  [birth year] [facility] [address] [place], [place]  [address] [phone]  Fax   [phone]  HPI: This pleasant 81 year old woman is seen for a scheduled followup office visit.  She has a history of mitral valve prolapse and history of hypercholesterolemia.  She has a history of scoliosis and osteoporosis.  Since last visit she has been doing well.  She has had no new cardiac symptoms.  She has had some problems with allergies of her eyes over the Christmas season.  She thinks it may have been from the dust of the Christmas tree ornaments. Her family gave her a fit bit to use to count her steps each day.  She is trying to get plenty of exercise. She remains on statin therapy for her cholesterol.  She is not having any myalgias. She remains on a low-cholesterol diet.  Current Outpatient Prescriptions  Medication Sig Dispense Refill  . aspirin 81 MG EC tablet Take 81 mg by mouth daily.      . Calcium Carb-Cholecalciferol (CALCIUM 500 +D PO) Take by mouth daily.     . Multiple Vitamin (MULTIVITAMIN) tablet Take 1 tablet by mouth daily.    . nadolol (CORGARD) 80 MG tablet TAKE 1 TABLET BY MOUTH EVERY DAY 90 tablet 0  . simvastatin (ZOCOR) 20 MG tablet TAKE 1 TABLET DAILY 90 tablet 0   No current facility-administered medications for this visit.    Allergies  Allergen Reactions  . Penicillins     Yeast infections  . Phenothiazines   . Tetracyclines & Related     Patient Active Problem List   Diagnosis Date Noted  . Kyphoscoliosis 09/16/2013  . Mitral valve prolapse 05/24/2011  . Hypercholesterolemia 05/24/2011    History  Smoking status  . Never Smoker   Smokeless tobacco  . Not on file    History  Alcohol Use No    Family History  Problem Relation Age of Onset  . Heart attack Mother   . Heart disease Father     Review of Systems: The patient denies any heat or cold intolerance.  No weight gain or weight  loss.  The patient denies headaches or blurry vision.  There is no cough or sputum production.  The patient denies dizziness.  There is no hematuria or hematochezia.  The patient denies any muscle aches or arthritis.  The patient denies any rash.  The patient denies frequent falling or instability.  There is no history of depression or anxiety.  All other systems were reviewed and are negative.   Physical Exam: Filed Vitals:   11/19/14 0809  BP: 130/74  Pulse: 56   The general appearance reveals a well-developed thin woman in no acute distress.  She has significant scoliosis.The head and neck exam reveals pupils equal and reactive.  Extraocular movements are full.  There is no scleral icterus.  The mouth and pharynx are normal.  The neck is supple.  The carotids reveal no bruits.  The jugular venous pressure is normal.  The  thyroid is not enlarged.  There is no lymphadenopathy.  The chest is clear to percussion and auscultation.  There are no rales or rhonchi.  Expansion of the chest is symmetrical.  The precordium is quiet.  The first heart sound is normal.  The second heart sound is physiologically split.  There is a midsystolic click with late systolic murmur of mitral valve prolapse.  There is no abnormal  lift or heave.  The abdomen is soft and nontender.  The bowel sounds are normal.  The liver and spleen are not enlarged.  There are no abdominal masses.  There are no abdominal bruits.  Extremities reveal good pedal pulses.  There is no phlebitis or edema.  There is no cyanosis or clubbing.  Strength is normal and symmetrical in all extremities.  There is no lateralizing weakness.  There are no sensory deficits.  The skin is warm and dry.  There is no rash.  EKG today shows sinus bradycardia and first-degree AV block and is otherwise within normal limits.   Assessment / Plan: 1.  Mitral valve prolapse 2.  Hypercholesterolemia 3.  Kyphoscoliosis  Plan: Lab work today pending.  Continue  current medication.  I do not want her to lose any more weight.  Recheck in 6 months for office visit lipid panel panel and basal metabolic panel.

## 2014-11-19 NOTE — Patient Instructions (Signed)
Will obtain labs today and call you with the results (lp/bmet/hfp)  Your physician recommends that you continue on your current medications as directed. Please refer to the Current Medication list given to you today.  Your physician wants you to follow-up in: 6 months with fasting labs (lp/bmet/hfp)  You will receive a reminder letter in the mail two months in advance. If you don't receive a letter, please call our office to schedule the follow-up appointment.  

## 2014-12-30 ENCOUNTER — Other Ambulatory Visit: Payer: Self-pay | Admitting: Cardiology

## 2015-01-09 ENCOUNTER — Other Ambulatory Visit: Payer: Self-pay | Admitting: Cardiology

## 2015-02-23 DIAGNOSIS — H00019 Hordeolum externum unspecified eye, unspecified eyelid: Secondary | ICD-10-CM | POA: Insufficient documentation

## 2015-05-10 ENCOUNTER — Other Ambulatory Visit: Payer: Self-pay

## 2015-05-25 ENCOUNTER — Ambulatory Visit (INDEPENDENT_AMBULATORY_CARE_PROVIDER_SITE_OTHER): Payer: Medicare Other | Admitting: Cardiology

## 2015-05-25 ENCOUNTER — Other Ambulatory Visit (INDEPENDENT_AMBULATORY_CARE_PROVIDER_SITE_OTHER): Payer: Medicare Other | Admitting: *Deleted

## 2015-05-25 ENCOUNTER — Encounter: Payer: Self-pay | Admitting: Cardiology

## 2015-05-25 VITALS — BP 130/70 | HR 49 | Ht <= 58 in | Wt 82.8 lb

## 2015-05-25 DIAGNOSIS — E7800 Pure hypercholesterolemia, unspecified: Secondary | ICD-10-CM

## 2015-05-25 DIAGNOSIS — I341 Nonrheumatic mitral (valve) prolapse: Secondary | ICD-10-CM

## 2015-05-25 DIAGNOSIS — M419 Scoliosis, unspecified: Secondary | ICD-10-CM

## 2015-05-25 LAB — LIPID PANEL
CHOL/HDL RATIO: 2
Cholesterol: 166 mg/dL (ref 0–200)
HDL: 72 mg/dL (ref >39.00)
LDL CALC: 80 mg/dL (ref 0–99)
NONHDL: 94
Triglycerides: 72 mg/dL (ref 0.0–149.0)
VLDL: 14.4 mg/dL (ref 0.0–40.0)

## 2015-05-25 LAB — BASIC METABOLIC PANEL
BUN: 23 mg/dL (ref 6–23)
CO2: 30 mEq/L (ref 19–32)
Calcium: 9.6 mg/dL (ref 8.4–10.5)
Chloride: 103 mEq/L (ref 96–112)
Creatinine, Ser: 0.6 mg/dL (ref 0.40–1.20)
GFR: 102.46 mL/min (ref >60.00)
Glucose, Bld: 88 mg/dL (ref 70–99)
Potassium: 3.9 mEq/L (ref 3.5–5.1)
Sodium: 139 mEq/L (ref 135–145)

## 2015-05-25 LAB — HEPATIC FUNCTION PANEL
ALBUMIN: 4 g/dL (ref 3.5–5.2)
ALK PHOS: 60 U/L (ref 39–117)
ALT: 17 U/L (ref 0–35)
AST: 25 U/L (ref 0–37)
BILIRUBIN DIRECT: 0.1 mg/dL (ref 0.0–0.3)
TOTAL PROTEIN: 6.9 g/dL (ref 6.0–8.3)
Total Bilirubin: 0.5 mg/dL (ref 0.2–1.2)

## 2015-05-25 NOTE — Addendum Note (Signed)
Addended by: Tonita PhoenixBOWDEN, Beckett Hickmon K on: 05/25/2015 07:59 AM   Modules accepted: Orders

## 2015-05-25 NOTE — Patient Instructions (Signed)
Medication Instructions:  Your physician recommends that you continue on your current medications as directed. Please refer to the Current Medication list given to you today.  Labwork: Lp/bmet/hfp  Testing/Procedures: none  Follow-Up: Your physician wants you to follow-up in: 6 months with fasting labs (lp/bmet/hfp)  You will receive a reminder letter in the mail two months in advance. If you don't receive a letter, please call our office to schedule the follow-up appointment.    

## 2015-05-25 NOTE — Progress Notes (Signed)
Cardiology Office Note   Date:  05/25/2015   ID:  Madison Odom, DOB 29-Nov-1933, MRN [id number]  PCP:  No primary care provider on file.  Cardiologist: Cassell Clement MD  Chief Complaint  Patient presents with  . Mitral Valve Prolapse      History of Present Illness: Madison Odom is a 81 y.o. female who presents for 6 month follow-up office visit   She has a history of mitral valve prolapse and history of hypercholesterolemia. She has a history of scoliosis and osteoporosis. Since last visit she has been doing well. She has had no new cardiac symptoms. Since we last saw her she had some eye surgery for a wrinkle in her macula in the left eye.  Unfortunately the surgery was not successful.  It bothers her when she reads. Her family gave her a fit bit to use to count her steps each day. She is trying to get plenty of exercise.  She averages walking about 2 miles a day.  She has a treadmill at home. She remains on statin therapy for her cholesterol. She is not having any myalgias. She remains on a low-cholesterol diet.  We were concerned about weight loss.  Her weight is stable since last visit. Her granddaughter is in the healthcare field as a exercise physiologist I believe and just got a job with the [employer] in [place]  Past Medical History  Diagnosis Date  . MVP (mitral valve prolapse)   . Hyperlipidemia   . OP (osteoporosis)   . Spastic colon     Past Surgical History  Procedure Laterality Date  . Appendectomy    . Bilateral oophorectomy  1981     Current Outpatient Prescriptions  Medication Sig Dispense Refill  . aspirin 81 MG EC tablet Take 81 mg by mouth daily.      . Calcium Carb-Cholecalciferol (CALCIUM 500 +D PO) Take by mouth daily.     . Multiple Vitamin (MULTIVITAMIN) tablet Take 1 tablet by mouth daily.    . nadolol (CORGARD) 80 MG tablet TAKE 1 TABLET BY MOUTH EVERY DAY 90 tablet 1  . simvastatin (ZOCOR) 20 MG tablet TAKE 1 TABLET  DAILY 90 tablet 1   No current facility-administered medications for this visit.    Allergies:   Penicillins; Phenothiazines; and Tetracyclines & related    Social History:  The patient  reports that she has never smoked. She does not have any smokeless tobacco history on file. She reports that she does not drink alcohol or use illicit drugs.   Family History:  The patient's family history includes Heart attack in her mother; Heart disease in her father; Hypertension in her father and mother. There is no history of Stroke.    ROS:  Please see the history of present illness.   Otherwise, review of systems are positive for none.   All other systems are reviewed and negative.    PHYSICAL EXAM: VS:  BP 130/70 mmHg  Pulse 49  Ht  (1.448 m)  Wt 82 lb 12.8 oz (37.558 kg)  BMI 17.91 kg/m2  SpO2 99% , BMI Body mass index is 17.91 kg/(m^2). GEN: Well nourished, well developed, in no acute distress HEENT: normal Neck: no JVD, carotid bruits, or masses Cardiac: Regular sinus rhythm.  Midsystolic click.  No gallop. Respiratory:  clear to auscultation bilaterally, normal work of breathing GI: soft, nontender, nondistended, + BS MS: no deformity or atrophy.  There is marked kyphoscoliosis Skin: warm  and dry, no rash Neuro:  Strength and sensation are intact Psych: euthymic mood, full affect   EKG:  EKG is not ordered today.    Recent Labs: 11/19/2014: ALT 16; BUN 27*; Creatinine, Ser 0.6; Potassium 4.0; Sodium 136    Lipid Panel    Component Value Date/Time   CHOL 167 11/19/2014 0834   TRIG 65.0 11/19/2014 0834   HDL 68.30 11/19/2014 0834   CHOLHDL 2 11/19/2014 0834   VLDL 13.0 11/19/2014 0834   LDLCALC 86 11/19/2014 0834      Wt Readings from Last 3 Encounters:  05/25/15 82 lb 12.8 oz (37.558 kg)  11/19/14 82 lb 12.8 oz (37.558 kg)  04/20/14 86 lb (39.009 kg)         ASSESSMENT AND PLAN:  1. Mitral valve prolapse 2. Hypercholesterolemia 3.  Kyphoscoliosis  Plan: Lab work today pending. Continue current medication. I do not want her to lose any more weight. Recheck in 6 months for office visit lipid panel panel and basal metabolic panel.     The following changes have been made:  no change  Labs/ tests ordered today include:   Orders Placed This Encounter  Procedures  . Lipid panel  . Hepatic function panel  . Basic metabolic panel    Disposition: Continue on same medication.  Recheck in 6 months for office visit.  Get lipid panel hepatic function panel and basal metabolic panel in 6 months.  Lab work today is pending.  Karie SchwalbeSigned, Caleah Tortorelli MD 05/25/2015 8:35 AM    [facility] [address], [place], [place]  [address] Phone: [phone]) [phone]; Fax: [phone]

## 2015-05-25 NOTE — Progress Notes (Signed)
Quick Note:  Please report to patient. The recent labs are stable. Continue same medication and careful diet. ______ 

## 2015-06-23 ENCOUNTER — Other Ambulatory Visit: Payer: Self-pay | Admitting: Cardiology

## 2015-07-04 ENCOUNTER — Other Ambulatory Visit: Payer: Self-pay | Admitting: Cardiology

## 2015-08-16 DIAGNOSIS — H35033 Hypertensive retinopathy, bilateral: Secondary | ICD-10-CM | POA: Insufficient documentation

## 2015-09-14 DIAGNOSIS — S329XXA Fracture of unspecified parts of lumbosacral spine and pelvis, initial encounter for closed fracture: Secondary | ICD-10-CM

## 2015-09-14 HISTORY — DX: Fracture of unspecified parts of lumbosacral spine and pelvis, initial encounter for closed fracture: S32.9XXA

## 2015-09-21 ENCOUNTER — Encounter (HOSPITAL_COMMUNITY): Payer: Self-pay

## 2015-09-21 ENCOUNTER — Observation Stay (HOSPITAL_COMMUNITY)
Admission: EM | Admit: 2015-09-21 | Discharge: 2015-09-23 | Disposition: A | Discharge status: Skilled Nursing Facility | Payer: Medicare Other | Attending: Internal Medicine | Admitting: Internal Medicine

## 2015-09-21 ENCOUNTER — Emergency Department (HOSPITAL_COMMUNITY): Payer: Medicare Other

## 2015-09-21 DIAGNOSIS — Z8249 Family history of ischemic heart disease and other diseases of the circulatory system: Secondary | ICD-10-CM | POA: Insufficient documentation

## 2015-09-21 DIAGNOSIS — E785 Hyperlipidemia, unspecified: Secondary | ICD-10-CM | POA: Insufficient documentation

## 2015-09-21 DIAGNOSIS — R339 Retention of urine, unspecified: Secondary | ICD-10-CM | POA: Insufficient documentation

## 2015-09-21 DIAGNOSIS — M25551 Pain in right hip: Secondary | ICD-10-CM | POA: Insufficient documentation

## 2015-09-21 DIAGNOSIS — W010XXA Fall on same level from slipping, tripping and stumbling without subsequent striking against object, initial encounter: Secondary | ICD-10-CM | POA: Insufficient documentation

## 2015-09-21 DIAGNOSIS — R531 Weakness: Secondary | ICD-10-CM | POA: Insufficient documentation

## 2015-09-21 DIAGNOSIS — R262 Difficulty in walking, not elsewhere classified: Secondary | ICD-10-CM | POA: Insufficient documentation

## 2015-09-21 DIAGNOSIS — I341 Nonrheumatic mitral (valve) prolapse: Secondary | ICD-10-CM | POA: Diagnosis not present

## 2015-09-21 DIAGNOSIS — S32592A Other specified fracture of left pubis, initial encounter for closed fracture: Secondary | ICD-10-CM | POA: Insufficient documentation

## 2015-09-21 DIAGNOSIS — Z880 Allergy status to penicillin: Secondary | ICD-10-CM | POA: Diagnosis not present

## 2015-09-21 DIAGNOSIS — S32591A Other specified fracture of right pubis, initial encounter for closed fracture: Secondary | ICD-10-CM | POA: Insufficient documentation

## 2015-09-21 DIAGNOSIS — Z7982 Long term (current) use of aspirin: Secondary | ICD-10-CM | POA: Diagnosis not present

## 2015-09-21 DIAGNOSIS — D72829 Elevated white blood cell count, unspecified: Secondary | ICD-10-CM | POA: Diagnosis not present

## 2015-09-21 DIAGNOSIS — Z79899 Other long term (current) drug therapy: Secondary | ICD-10-CM | POA: Insufficient documentation

## 2015-09-21 DIAGNOSIS — S329XXA Fracture of unspecified parts of lumbosacral spine and pelvis, initial encounter for closed fracture: Secondary | ICD-10-CM | POA: Diagnosis not present

## 2015-09-21 DIAGNOSIS — M419 Scoliosis, unspecified: Secondary | ICD-10-CM | POA: Diagnosis not present

## 2015-09-21 DIAGNOSIS — E7800 Pure hypercholesterolemia, unspecified: Secondary | ICD-10-CM | POA: Diagnosis not present

## 2015-09-21 DIAGNOSIS — S3210XA Unspecified fracture of sacrum, initial encounter for closed fracture: Principal | ICD-10-CM | POA: Insufficient documentation

## 2015-09-21 HISTORY — DX: Fracture of unspecified parts of lumbosacral spine and pelvis, initial encounter for closed fracture: S32.9XXA

## 2015-09-21 LAB — I-STAT CHEM 8, ED
BUN: 27 mg/dL — AB (ref 6–20)
CALCIUM ION: 1.12 mmol/L — AB (ref 1.13–1.30)
Chloride: 102 mmol/L (ref 101–111)
Creatinine, Ser: 0.4 mg/dL — ABNORMAL LOW (ref 0.44–1.00)
Glucose, Bld: 110 mg/dL — ABNORMAL HIGH (ref 65–99)
HEMATOCRIT: 36 % (ref 36.0–46.0)
HEMOGLOBIN: 12.2 g/dL (ref 12.0–15.0)
Potassium: 3.8 mmol/L (ref 3.5–5.1)
SODIUM: 137 mmol/L (ref 135–145)
TCO2: 22 mmol/L (ref 0–100)

## 2015-09-21 LAB — CBC
HCT: 34.2 % — ABNORMAL LOW (ref 36.0–46.0)
Hemoglobin: 10.6 g/dL — ABNORMAL LOW (ref 12.0–15.0)
MCH: 24.7 pg — AB (ref 26.0–34.0)
MCHC: 31 g/dL (ref 30.0–36.0)
MCV: 79.5 fL (ref 78.0–100.0)
PLATELETS: 301 10*3/uL (ref 150–400)
RBC: 4.3 MIL/uL (ref 3.87–5.11)
RDW: 17.3 % — AB (ref 11.5–15.5)
WBC: 14.1 10*3/uL — ABNORMAL HIGH (ref 4.0–10.5)

## 2015-09-21 MED ORDER — ACETAMINOPHEN 650 MG RE SUPP: 650.0000 mg | Freq: Four times a day (QID) | RECTAL | Status: DC | PRN

## 2015-09-21 MED ORDER — OXYCODONE-ACETAMINOPHEN 5-325 MG PO TABS
ORAL_TABLET | ORAL | Status: AC
  Filled 2015-09-21: qty 1

## 2015-09-21 MED ORDER — ENOXAPARIN SODIUM 30 MG/0.3ML ~~LOC~~ SOLN
20.0000 mg | SUBCUTANEOUS | Status: DC
  Administered 2015-09-22 – 2015-09-23 (×2): 20 mg via SUBCUTANEOUS
  Filled 2015-09-21 (×2): qty 0.3

## 2015-09-21 MED ORDER — CALCIUM CARBONATE-VITAMIN D 500-200 MG-UNIT PO TABS
ORAL_TABLET | Freq: Every day | ORAL | Status: DC
  Administered 2015-09-22 – 2015-09-23 (×2): 1 via ORAL
  Filled 2015-09-21 (×2): qty 1

## 2015-09-21 MED ORDER — ASPIRIN EC 81 MG PO TBEC: 81.0000 mg | DELAYED_RELEASE_TABLET | Freq: Every day | ORAL | Status: DC

## 2015-09-21 MED ORDER — ASPIRIN EC 81 MG PO TBEC
81.0000 mg | DELAYED_RELEASE_TABLET | Freq: Every day | ORAL | Status: DC
  Administered 2015-09-22 – 2015-09-23 (×3): 81 mg via ORAL
  Filled 2015-09-21 (×3): qty 1

## 2015-09-21 MED ORDER — ONDANSETRON HCL 4 MG/2ML IJ SOLN: 4.0000 mg | Freq: Three times a day (TID) | INTRAMUSCULAR | Status: DC | PRN

## 2015-09-21 MED ORDER — OXYCODONE-ACETAMINOPHEN 5-325 MG PO TABS
1.0000 | ORAL_TABLET | Freq: Once | ORAL | Status: AC
  Administered 2015-09-21: 1 via ORAL

## 2015-09-21 MED ORDER — ACETAMINOPHEN 325 MG PO TABS
650.0000 mg | ORAL_TABLET | Freq: Four times a day (QID) | ORAL | Status: DC | PRN
  Administered 2015-09-23: 650 mg via ORAL
  Filled 2015-09-21: qty 2

## 2015-09-21 MED ORDER — SODIUM CHLORIDE 0.9 % IV SOLN
INTRAVENOUS | Status: DC
  Administered 2015-09-21 – 2015-09-22 (×2): via INTRAVENOUS

## 2015-09-21 MED ORDER — ONDANSETRON HCL 4 MG/2ML IJ SOLN: 4.0000 mg | Freq: Four times a day (QID) | INTRAMUSCULAR | Status: DC | PRN

## 2015-09-21 MED ORDER — SIMVASTATIN 20 MG PO TABS
20.0000 mg | ORAL_TABLET | Freq: Every day | ORAL | Status: DC
  Administered 2015-09-22 (×2): 20 mg via ORAL
  Filled 2015-09-21 (×3): qty 1

## 2015-09-21 MED ORDER — SIMVASTATIN 20 MG PO TABS: 20.0000 mg | ORAL_TABLET | Freq: Every day | ORAL | Status: DC

## 2015-09-21 MED ORDER — METHOCARBAMOL 1000 MG/10ML IJ SOLN
500.0000 mg | Freq: Three times a day (TID) | INTRAVENOUS | Status: DC | PRN
  Administered 2015-09-22 – 2015-09-23 (×4): 500 mg via INTRAVENOUS
  Filled 2015-09-21 (×9): qty 5

## 2015-09-21 MED ORDER — HYDROMORPHONE HCL 1 MG/ML IJ SOLN
0.5000 mg | INTRAMUSCULAR | Status: DC | PRN
  Administered 2015-09-22: 0.5 mg via INTRAVENOUS
  Filled 2015-09-21: qty 1

## 2015-09-21 MED ORDER — ONDANSETRON HCL 4 MG PO TABS: 4.0000 mg | ORAL_TABLET | Freq: Four times a day (QID) | ORAL | Status: DC | PRN

## 2015-09-21 MED ORDER — OXYCODONE HCL 5 MG PO TABS
5.0000 mg | ORAL_TABLET | ORAL | Status: DC | PRN
  Administered 2015-09-22 (×2): 5 mg via ORAL
  Filled 2015-09-21 (×3): qty 1

## 2015-09-21 MED ORDER — NADOLOL 80 MG PO TABS
80.0000 mg | ORAL_TABLET | Freq: Every day | ORAL | Status: DC
  Administered 2015-09-22 – 2015-09-23 (×3): 80 mg via ORAL
  Filled 2015-09-21 (×3): qty 1

## 2015-09-21 MED ORDER — NADOLOL 80 MG PO TABS: 80.0000 mg | ORAL_TABLET | Freq: Every day | ORAL | Status: DC

## 2015-09-21 MED ORDER — SODIUM CHLORIDE 0.9 % IJ SOLN: 3.0000 mL | Freq: Two times a day (BID) | INTRAMUSCULAR | Status: DC

## 2015-09-21 MED ORDER — ADULT MULTIVITAMIN W/MINERALS CH
1.0000 | ORAL_TABLET | Freq: Every day | ORAL | Status: DC
  Administered 2015-09-22 – 2015-09-23 (×2): 1 via ORAL
  Filled 2015-09-21 (×2): qty 1

## 2015-09-21 MED ORDER — LEVALBUTEROL HCL 0.63 MG/3ML IN NEBU: 0.6300 mg | INHALATION_SOLUTION | Freq: Four times a day (QID) | RESPIRATORY_TRACT | Status: DC | PRN

## 2015-09-21 NOTE — ED Notes (Signed)
Pt had fall from standing appx 2 weeks ago. Went to orthopedic clinic on Saturday, had xrays performed (which were negative) and gave her steroid shot. Pt still has continued pain to right posterior thigh, unable to lift it up when standing. Unable to bear weight. Denies new injury. AO x4.

## 2015-09-21 NOTE — ED Provider Notes (Signed)
CSN: [id number]     Arrival date & time 09/21/15  1809 History   First MD Initiated Contact with Patient 09/21/15 1908     Chief Complaint  Patient presents with  . Hip Pain     (Consider location/radiation/quality/duration/timing/severity/associated sxs/prior Treatment) HPI Comments: The patient is a 81 year old female, she has a history of fall in the last month - she was initially taken care of by family after she fell on her left hip, she then started to develop some pain in her right side and was seen at the orthopedic office is, plain film imaging of her bilateral hips and pelvis showed no abnormalities, she then developed yesterday some severe pain in her right buttock and leg and has had severe pain with walking. This is persistent, worse with walking, better with laying down and flexing the hip, not associated with numbness or weakness of the leg.  Patient is a 81 y.o. female presenting with hip pain. The history is provided by the patient.  Hip Pain    Past Medical History  Diagnosis Date  . MVP (mitral valve prolapse)   . Hyperlipidemia   . OP (osteoporosis)   . Spastic colon    Past Surgical History  Procedure Laterality Date  . Appendectomy    . Bilateral oophorectomy  1981   Family History  Problem Relation Age of Onset  . Heart attack Mother   . Heart disease Father   . Hypertension Mother   . Hypertension Father   . Stroke Neg Hx    Social History  Substance Use Topics  . Smoking status: Never Smoker   . Smokeless tobacco: None  . Alcohol Use: No   OB History    No data available     Review of Systems  All other systems reviewed and are negative.     Allergies  Penicillins; Phenothiazines; and Tetracyclines & related  Home Medications   Prior to Admission medications   Medication Sig Start Date End Date Taking? Authorizing Provider  aspirin 81 MG EC tablet Take 81 mg by mouth daily.     Yes Historical Provider, MD  Calcium  Carb-Cholecalciferol (CALCIUM 500 +D PO) Take 1 tablet by mouth daily.    Yes Historical Provider, MD  Multiple Vitamin (MULTIVITAMIN) tablet Take 1 tablet by mouth daily.   Yes Historical Provider, MD  nadolol (CORGARD) 80 MG tablet TAKE 1 TABLET BY MOUTH EVERY DAY 07/05/15  Yes [name], MD  simvastatin (ZOCOR) 20 MG tablet TAKE 1 TABLET DAILY 06/23/15  Yes [name], MD   BP 152/60 mmHg  Pulse 71  Temp(Src) 98 F (36.7 C)  Resp 18  SpO2 98% Physical Exam  Constitutional: She appears well-developed and well-nourished. No distress.  HENT:  Head: Normocephalic and atraumatic.  Mouth/Throat: Oropharynx is clear and moist. No oropharyngeal exudate.  Eyes: Conjunctivae and EOM are normal. Pupils are equal, round, and reactive to light. Right eye exhibits no discharge. Left eye exhibits no discharge. No scleral icterus.  Neck: Normal range of motion. Neck supple. No JVD present. No thyromegaly present.  Cardiovascular: Normal rate, regular rhythm, normal heart sounds and intact distal pulses.  Exam reveals no gallop and no friction rub.   No murmur heard. Pulmonary/Chest: Effort normal and breath sounds normal. No respiratory distress. She has no wheezes. She has no rales.  Abdominal: Soft. Bowel sounds are normal. She exhibits no distension and no mass. There is no tenderness.  Musculoskeletal: Normal range of motion. She exhibits tenderness (  Tenderness with palpation over the right sacrum). She exhibits no edema.  No edema, normal strength in all 4 extremities, pain with straight leg raise on the right, improved with flexion of the knee  Lymphadenopathy:    She has no cervical adenopathy.  Neurological: She is alert. Coordination normal.  Able to straight leg raise bilaterally, pain with raising the right leg. Normal sensation to the bilateral lower extremities  Skin: Skin is warm and dry. No rash noted. No erythema.  Psychiatric: She has a normal mood and affect. Her  behavior is normal.  Nursing note and vitals reviewed.   ED Course  Procedures (including critical care time) Labs Review Labs Reviewed  CBC - Abnormal; Notable for the following:    WBC 14.1 (*)    Hemoglobin 10.6 (*)    HCT 34.2 (*)    MCH 24.7 (*)    RDW 17.3 (*)    All other components within normal limits  I-STAT CHEM 8, ED - Abnormal; Notable for the following:    BUN 27 (*)    Creatinine, Ser 0.40 (*)    Glucose, Bld 110 (*)    Calcium, Ion 1.12 (*)    All other components within normal limits    Imaging Review Ct Hip Right Wo Contrast  09/21/2015  CLINICAL DATA:  Right hip pain after fall this weekend. EXAM: CT OF THE RIGHT HIP WITHOUT CONTRAST TECHNIQUE: Multidetector CT imaging of the right hip was performed according to the standard protocol. Multiplanar CT image reconstructions were also generated. COMPARISON:  None.  No radiographs available. FINDINGS: Mildly displaced fracture of the right sacrum, lateral to the sacral foramen. Comminuted minimally displaced fractures of the LEFT inferior and superior pubic rami, only partially included in the field of view. No extension of the pubic symphysis. Right pubic rami appear intact. Bony under mineralization. Right femoral head is well seated in the acetabulum, no definite right hip fracture. 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 IMPRESSION: 1. Minimally displaced right sacral fracture lateral to the sacral foramen. 2. Comminuted mildly displaced fractures of the LEFT inferior and superior pubic rami, partially included in the field of view. 3. Right hip joint appears intact in this patient with osteoporosis. Electronically Signed   By: Rubye Oaks M.D.   On: 09/21/2015 19:53   I have personally reviewed and evaluated these images and lab results as part of my medical decision-making.    MDM   Final diagnoses:  Closed fracture of pelvis, unspecified part of pelvis, initial  encounter [facility])    The patient has evidence of sciatic nerve pain, CT scan imaging confirms that the patient has displaced fractures of the inferior and superior pubic rami on the left as well as a right sacral fracture. The right hip joint appears normal. We'll discuss with orthopedics.  Dr. Dion Saucier recommends WB as tolerated - pt can't go home without appropriate assistance as she can't ambulate safely - requires eval for rehab placement.  D/w hospitalist who will admit  Labs normal  Eber Hong, MD 09/21/15 2231

## 2015-09-21 NOTE — H&P (Signed)
[facility] History and Physical  Cheryl Flashatsy A Prichard OZH:[id number] DOB: [birth year] DOA: 09/21/2015  Referring physician: PCP: Ronny FlurryBrackbill, Tom, MD   Chief Complaint: Pelvic pain   HPI:  81 year old female without any significant past medical history other than mitral valve prolapse, dyslipidemia, followed by [name], MD , who presents to the ER because of inability to walk and complains of right sided pelvic pain and groin pain. Patient had a mechanical fall 2 weeks ago when she tripped backwards and fell on cement, to not hit her head completely lose consciousness. Denied any prior chest pain dizziness or palpitations prior to the fall. Patient was seen at orthopedic urgent care on 11/5 and she was treated with a steroid injection in her right buttock after which the pain improved. Patient was able to ambulate over the weekend but started developing worsening pain with walking and unable to lift her right leg off the bed. Patient was brought in for pain control.CT scan imaging confirms that the patient has displaced fractures of the inferior and superior pubic rami on the left as well as a right sacral fracture. The right hip joint appears normal. EDP discussed with Dr. Dion SaucierLandau recommends WB as tolerated - pt can't go home without appropriate assistance as she can't ambulate safely - requires eval for rehab      Review of Systems: negative for the following  Constitutional: Denies fever, chills, diaphoresis, appetite change and fatigue.  HEENT: Denies photophobia, eye pain, redness, hearing loss, ear pain, congestion, sore throat, rhinorrhea, sneezing, mouth sores, trouble swallowing, neck pain, neck stiffness and tinnitus.  Respiratory: Denies SOB, DOE, cough, chest tightness, and wheezing.  Cardiovascular: Denies chest pain, palpitations and leg swelling.  Gastrointestinal: Denies nausea, vomiting, abdominal pain, diarrhea, constipation, blood in stool and abdominal distention.   Genitourinary: Denies dysuria, urgency, frequency, hematuria, flank pain and difficulty urinating.  Musculoskeletal: Right sided pelvic pain, back pain, joint swelling, arthralgias and gait problem.  Skin: Denies pallor, rash and wound.  Neurological: Denies dizziness, seizures, syncope, weakness, light-headedness, numbness and headaches.  Hematological: Denies adenopathy. Easy bruising, personal or family bleeding history  Psychiatric/Behavioral: Denies suicidal ideation, mood changes, confusion, nervousness, sleep disturbance and agitation       Past Medical History  Diagnosis Date  . MVP (mitral valve prolapse)   . Hyperlipidemia   . OP (osteoporosis)   . Spastic colon      Past Surgical History  Procedure Laterality Date  . Appendectomy    . Bilateral oophorectomy  1981      Social History:  reports that she has never smoked. She does not have any smokeless tobacco history on file. She reports that she does not drink alcohol or use illicit drugs.    Allergies  Allergen Reactions  . Penicillins     Yeast infections  . Phenothiazines   . Tetracyclines & Related     Family History  Problem Relation Age of Onset  . Heart attack Mother   . Heart disease Father   . Hypertension Mother   . Hypertension Father   . Stroke Neg Hx          Prior to Admission medications   Medication Sig Start Date End Date Taking? Authorizing Provider  aspirin 81 MG EC tablet Take 81 mg by mouth daily.     Yes Historical Provider, MD  Calcium Carb-Cholecalciferol (CALCIUM 500 +D PO) Take 1 tablet by mouth daily.    Yes Historical Provider, MD  Multiple Vitamin (MULTIVITAMIN) tablet Take  1 tablet by mouth daily.   Yes Historical Provider, MD  nadolol (CORGARD) 80 MG tablet TAKE 1 TABLET BY MOUTH EVERY DAY 07/05/15  Yes Cassell Clement, MD  simvastatin (ZOCOR) 20 MG tablet TAKE 1 TABLET DAILY 06/23/15  Yes Cassell Clement, MD     Physical Exam: Filed Vitals:   09/21/15 2230  09/21/15 2245 09/21/15 2300 09/21/15 2321  BP: 129/50 129/60 131/59 126/49  Pulse: 73 80 82 78  Temp:    98.6 F (37 C)  TempSrc:    Oral  Resp:    17  SpO2: 94%   92%     Constitutional: Vital signs reviewed. Patient is a well-developed and well-nourished in no acute distress and cooperative with exam. Alert and oriented x3.  Head: Normocephalic and atraumatic  Ear: TM normal bilaterally  Mouth: no erythema or exudates, MMM  Eyes: PERRL, EOMI, conjunctivae normal, No scleral icterus.  Neck: Supple, Trachea midline normal ROM, No JVD, mass, thyromegaly, or carotid bruit present.  Cardiovascular: Normal rate, regular rhythm, normal heart sounds and intact distal pulses. Exam reveals no gallop and no friction rub.  No murmur heard. Pulmonary/Chest: Effort normal and breath sounds normal. No respiratory distress. She has no wheezes. She has no rales.  Abdominal: Soft. Bowel sounds are normal. She exhibits no distension and no mass. There is no tenderness.  Musculoskeletal: Normal range of motion. She exhibits tenderness ( Tenderness with palpation over the right sacrum). She exhibits no edema.  No edema, normal strength in all 4 extremities, pain with straight leg raise on the right, improved with flexion of the knee  Lymphadenopathy:   She has no cervical adenopathy.  Neurological: She is alert. Coordination normal.  Able to straight leg raise bilaterally, pain with raising the right leg. Normal sensation to the bilateral lower extremities  Skin: Skin is warm and dry. No rash noted. No erythema.   Skin: Warm, dry and intact. No rash, cyanosis, or clubbing.  Psychiatric: Normal mood and affect. speech and behavior is normal. Judgment and thought content normal. Cognition and memory are normal.      Data Review   Micro Results No results found for this or any previous visit (from the past 240 hour(s)).  Radiology Reports Ct Hip Right Wo Contrast  09/21/2015  CLINICAL DATA:   Right hip pain after fall this weekend. EXAM: CT OF THE RIGHT HIP WITHOUT CONTRAST TECHNIQUE: Multidetector CT imaging of the right hip was performed according to the standard protocol. Multiplanar CT image reconstructions were also generated. COMPARISON:  None.  No radiographs available. FINDINGS: Mildly displaced fracture of the right sacrum, lateral to the sacral foramen. Comminuted minimally displaced fractures of the LEFT inferior and superior pubic rami, only partially included in the field of view. No extension of the pubic symphysis. Right pubic rami appear intact. Bony under mineralization. Right femoral head is well seated in the acetabulum, no definite right hip fracture. 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 IMPRESSION: 1. Minimally displaced right sacral fracture lateral to the sacral foramen. 2. Comminuted mildly displaced fractures of the LEFT inferior and superior pubic rami, partially included in the field of view. 3. Right hip joint appears intact in this patient with osteoporosis. Electronically Signed   By: Rubye Oaks M.D.   On: 09/21/2015 19:53     CBC  Recent Labs Lab 09/21/15 2140 09/21/15 2150  WBC 14.1*  --   HGB 10.6* 12.2  HCT 34.2* 36.0  PLT  301  --   MCV 79.5  --   MCH 24.7*  --   MCHC 31.0  --   RDW 17.3*  --     Chemistries   Recent Labs Lab 09/21/15 2150  NA 137  K 3.8  CL 102  GLUCOSE 110*  BUN 27*  CREATININE 0.40*   ------------------------------------------------------------------------------------------------------------------ CrCl cannot be calculated (Unknown ideal weight.). ------------------------------------------------------------------------------------------------------------------ No results for input(s): HGBA1C in the last 72 hours. ------------------------------------------------------------------------------------------------------------------ No results for input(s): CHOL, HDL,  LDLCALC, TRIG, CHOLHDL, LDLDIRECT in the last 72 hours. ------------------------------------------------------------------------------------------------------------------ No results for input(s): TSH, T4TOTAL, T3FREE, THYROIDAB in the last 72 hours.  Invalid input(s): FREET3 ------------------------------------------------------------------------------------------------------------------ No results for input(s): VITAMINB12, FOLATE, FERRITIN, TIBC, IRON, RETICCTPCT in the last 72 hours.  Coagulation profile No results for input(s): INR, PROTIME in the last 168 hours.  No results for input(s): DDIMER in the last 72 hours.  Cardiac Enzymes No results for input(s): CKMB, TROPONINI, MYOGLOBIN in the last 168 hours.  Invalid input(s): CK ------------------------------------------------------------------------------------------------------------------ Invalid input(s): POCBNP   CBG: No results for input(s): GLUCAP in the last 168 hours.     EKG: Independently reviewed.    Assessment/Plan Closed fracture of the pelvis-EDP discussed with Dr.Landau, he recommends WB as tolerated , will start patient on Robaxin and oxycodone with prn Dilaudid for pain control PT/OT evaluation Telemetry monitoring for 24 hours Patient currently under observation for pain control    Hypercholesterolemia-continue Zocor    Kyphoscoliosis   Mitral valve prolapse-continue nadolol   Code Status:   full Family Communication: bedside Disposition Plan: admit   Total time spent 55 minutes.Greater than 50% of this time was spent in counseling, explanation of diagnosis, planning of further management, and coordination of care  [facility] Pager [phone]  If 7PM-7AM, please contact night-coverage [email or web address] Password [facility] 09/21/2015, 11:28 PM

## 2015-09-22 ENCOUNTER — Observation Stay (HOSPITAL_BASED_OUTPATIENT_CLINIC_OR_DEPARTMENT_OTHER): Payer: Medicare Other

## 2015-09-22 ENCOUNTER — Observation Stay (HOSPITAL_COMMUNITY): Payer: Medicare Other

## 2015-09-22 DIAGNOSIS — S329XXA Fracture of unspecified parts of lumbosacral spine and pelvis, initial encounter for closed fracture: Secondary | ICD-10-CM | POA: Diagnosis not present

## 2015-09-22 DIAGNOSIS — E7800 Pure hypercholesterolemia, unspecified: Secondary | ICD-10-CM | POA: Diagnosis not present

## 2015-09-22 DIAGNOSIS — S3210XA Unspecified fracture of sacrum, initial encounter for closed fracture: Secondary | ICD-10-CM | POA: Diagnosis not present

## 2015-09-22 DIAGNOSIS — R0600 Dyspnea, unspecified: Secondary | ICD-10-CM | POA: Diagnosis not present

## 2015-09-22 DIAGNOSIS — M419 Scoliosis, unspecified: Secondary | ICD-10-CM | POA: Diagnosis not present

## 2015-09-22 LAB — COMPREHENSIVE METABOLIC PANEL
ALBUMIN: 2.5 g/dL — AB (ref 3.5–5.0)
ALT: 23 U/L (ref 14–54)
AST: 30 U/L (ref 15–41)
Alkaline Phosphatase: 108 U/L (ref 38–126)
Anion gap: 7 (ref 5–15)
BILIRUBIN TOTAL: 0.4 mg/dL (ref 0.3–1.2)
BUN: 20 mg/dL (ref 6–20)
CHLORIDE: 105 mmol/L (ref 101–111)
CO2: 26 mmol/L (ref 22–32)
CREATININE: 0.43 mg/dL — AB (ref 0.44–1.00)
Calcium: 8.3 mg/dL — ABNORMAL LOW (ref 8.9–10.3)
GFR calc Af Amer: >60 mL/min (ref >60)
GLUCOSE: 99 mg/dL (ref 65–99)
POTASSIUM: 3.7 mmol/L (ref 3.5–5.1)
Sodium: 138 mmol/L (ref 135–145)
Total Protein: 5.3 g/dL — ABNORMAL LOW (ref 6.5–8.1)

## 2015-09-22 LAB — CBC
HCT: 31.9 % — ABNORMAL LOW (ref 36.0–46.0)
HEMATOCRIT: 31.3 % — AB (ref 36.0–46.0)
Hemoglobin: 9.8 g/dL — ABNORMAL LOW (ref 12.0–15.0)
Hemoglobin: 9.5 g/dL — ABNORMAL LOW (ref 12.0–15.0)
MCH: 24.2 pg — ABNORMAL LOW (ref 26.0–34.0)
MCH: 24.6 pg — AB (ref 26.0–34.0)
MCHC: 30.4 g/dL (ref 30.0–36.0)
MCHC: 30.7 g/dL (ref 30.0–36.0)
MCV: 79.9 fL (ref 78.0–100.0)
MCV: 79.6 fL (ref 78.0–100.0)
PLATELETS: 281 10*3/uL (ref 150–400)
Platelets: 293 10*3/uL (ref 150–400)
RBC: 3.99 MIL/uL (ref 3.87–5.11)
RBC: 3.93 MIL/uL (ref 3.87–5.11)
RDW: 17.4 % — AB (ref 11.5–15.5)
RDW: 17.5 % — AB (ref 11.5–15.5)
WBC: 12.5 10*3/uL — ABNORMAL HIGH (ref 4.0–10.5)
WBC: 10.6 10*3/uL — AB (ref 4.0–10.5)

## 2015-09-22 LAB — URINALYSIS, ROUTINE W REFLEX MICROSCOPIC
Bilirubin Urine: NEGATIVE
GLUCOSE, UA: 100 mg/dL — AB
Hgb urine dipstick: NEGATIVE
Ketones, ur: 15 mg/dL — AB
LEUKOCYTES UA: NEGATIVE
NITRITE: NEGATIVE
PH: 6.5 (ref 5.0–8.0)
Protein, ur: NEGATIVE mg/dL
SPECIFIC GRAVITY, URINE: 1.024 (ref 1.005–1.030)
Urobilinogen, UA: 0.2 mg/dL (ref 0.0–1.0)

## 2015-09-22 LAB — CREATININE, SERUM
CREATININE: 0.47 mg/dL (ref 0.44–1.00)
GFR calc Af Amer: >60 mL/min (ref >60)

## 2015-09-22 LAB — MAGNESIUM: Magnesium: 1.8 mg/dL (ref 1.7–2.4)

## 2015-09-22 LAB — TROPONIN I
TROPONIN I: 0.07 ng/mL — AB (ref <0.031)
Troponin I: 0.06 ng/mL — ABNORMAL HIGH (ref <0.031)
Troponin I: 0.08 ng/mL — ABNORMAL HIGH (ref <0.031)

## 2015-09-22 LAB — TSH: TSH: 0.734 u[IU]/mL (ref 0.350–4.500)

## 2015-09-22 MED ORDER — ASPIRIN 81 MG PO CHEW: 81.0000 mg | CHEWABLE_TABLET | Freq: Every day | ORAL | Status: DC

## 2015-09-22 NOTE — Care Management Obs Status (Signed)
MEDICARE OBSERVATION STATUS NOTIFICATION   Patient Details  Name: Madison Odom MRN: [id number] Date of Birth: 09-11-1934   Medicare Observation Status Notification Given:  Yes    Elliot CousinShavis, Kayleeann Huxford Ellen, RN 09/22/2015, 12:15 PM

## 2015-09-22 NOTE — Evaluation (Signed)
Physical Therapy Evaluation Patient Details Name: KAVYA HAAG MRN: [id number] DOB: 10/07/1934 Today's Date: 09/22/2015   History of Present Illness  81 yo female with onset of fall, struggled to walk then discovered R sacral and L inferior and superior pubic rami fractures.  Also elevated troponin, leukocytosis, scoliosis.  Clinical Impression  Pt is working to move off bed and to chair with slow pace and resting once she gets there.  Safety is an issue as pt is unsteady on her feet and needs dense cues for avoiding obstacles and safe maneuvering.  Will continue with PT regularly until SNF is determined     Follow Up Recommendations SNF    Equipment Recommendations  Rolling walker with 5" wheels    Recommendations for Other Services       Precautions / Restrictions Precautions Precautions: Fall (telemetry) Precaution Comments: pulses normal up to 83 with mobility Restrictions Weight Bearing Restrictions: Yes Other Position/Activity Restrictions: WBAT      Mobility  Bed Mobility Overal bed mobility: Needs Assistance Bed Mobility: Supine to Sit     Supine to sit: Min assist;Mod assist     General bed mobility comments: assist mainly to move LE's and to support trunk  Transfers Overall transfer level: Needs assistance Equipment used: Rolling walker (2 wheeled);1 person hand held assist Transfers: Sit to/from [employer] Sit to Stand: Mod assist Stand pivot transfers: Min assist       General transfer comment: instructed pt to maneuver with chair moved close, fully cued hand placement  Ambulation/Gait Ambulation/Gait assistance: Min assist;Mod assist Ambulation Distance (Feet): 5 Feet Assistive device: Rolling walker (2 wheeled);1 person hand held assist Gait Pattern/deviations: Step-to pattern;Decreased stride length;Decreased dorsiflexion - right;Decreased dorsiflexion - left;Shuffle;Wide base of support;Trunk flexed Gait velocity: reduced Gait  velocity interpretation: Below normal speed for age/gender General Gait Details: halting hesitating steps with pain to WB on LLE  Stairs            Wheelchair Mobility    Modified Rankin (Stroke Patients Only)       Balance Overall balance assessment: Needs assistance   Sitting balance-Leahy Scale: Fair       Standing balance-Leahy Scale: Poor Standing balance comment: used cues and gait belt on RW to maintain balance                             Pertinent Vitals/Pain Pain Assessment: Faces Pain Score: 6  Faces Pain Scale: Hurts even more Pain Location: pain LLE with WBing Pain Descriptors / Indicators: Spasm Pain Intervention(s): Limited activity within patient's tolerance;Monitored during session;Premedicated before session;Repositioned    Home Living Family/patient expects to be discharged to:: Skilled nursing facility                      Prior Function Level of Independence: Independent         Comments: got a walker with wheels since her fall with RLE pain     Hand Dominance        Extremity/Trunk Assessment   Upper Extremity Assessment: Overall WFL for tasks assessed           Lower Extremity Assessment: Generalized weakness      Cervical / Trunk Assessment: Kyphotic  Communication   Communication: No difficulties  Cognition Arousal/Alertness: Awake/alert Behavior During Therapy: WFL for tasks assessed/performed Overall Cognitive Status: Within Functional Limits for tasks assessed  General Comments General comments (skin integrity, edema, etc.): Pt is weak but interested in trying to move.  Will continue to work toward her walking as her orders allow as she is permitted only short trips for now    Exercises        Assessment/Plan    PT Assessment Patient needs continued PT services  PT Diagnosis Difficulty walking;Generalized weakness   PT Problem List Decreased  strength;Decreased range of motion;Decreased activity tolerance;Decreased balance;Decreased mobility;Decreased coordination;Decreased knowledge of use of DME;Decreased safety awareness;Decreased knowledge of precautions;Cardiopulmonary status limiting activity;Pain  PT Treatment Interventions DME instruction;Gait training;Functional mobility training;Therapeutic activities;Therapeutic exercise;Balance training;Neuromuscular re-education;Patient/family education   PT Goals (Current goals can be found in the Care Plan section) Acute Rehab PT Goals Patient Stated Goal: to not fall again and hurt less PT Goal Formulation: With patient Time For Goal Achievement: 10/06/15 Potential to Achieve Goals: Good    Frequency Min 4X/week   Barriers to discharge Inaccessible home environment;Decreased caregiver support needs to be at SNF    Co-evaluation               End of Session Equipment Utilized During Treatment: Gait belt Activity Tolerance: Patient tolerated treatment well;Patient limited by pain Patient left: in chair;with call bell/phone within reach Nurse Communication: Mobility status;Weight bearing status         Time: 1310-1335 PT Time Calculation (min) (ACUTE ONLY): 25 min   Charges:   PT Evaluation $Initial PT Evaluation Tier I: 1 Procedure PT Treatments $Therapeutic Activity: 8-22 mins   PT G Codes:        Ivar DrapeStout, Marialuiza Car E 09/22/2015, 3:15 PM   Samul Dadauth Shelena Castelluccio, PT MS Acute Rehab Dept. Number: [facility] [phone] and [facility] [phone]

## 2015-09-22 NOTE — Care Management Note (Addendum)
Case Management Note  Patient Details  Name: Madison Odom MRN: [id number] Date of Birth: 01-Sep-1934  Subjective/Objective:    Closed fracture of pelvis                 Action/Plan: NCM spoke to pt and gave permission to speak to dtr, Vennie HomansLiz Joyce # [phone] and # [phone]. Spoke to pt about observation status vs IP. States she wants to go to SNF-rehab. Lives at home alone. Discussed payment of services maybe out of pocket due to 3 IP qualifying night stay needed for Medicare. Pt states she has a Long Term Care policy with [employer]. Dtrs will contact rep for LTC policy. Pt prefers [facility]. Dtrs will go over to [facility] to check out facility. Will also check on [facility] and [facility]. CSW referral for SNF. Left message for CSW, Irving Burtonmily to follow up with pt and family.   Expected Discharge Date:  09/23/2015               Expected Discharge Plan:  Skilled Nursing Facility  In-House Referral:  Clinical Social Work  Discharge planning Services  CM Consult   Status of Service:  Completed, signed off  Medicare Important Message Given:    Date Medicare IM Given:    Medicare IM give by:    Date Additional Medicare IM Given:    Additional Medicare Important Message give by:     If discussed at Long Length of Stay Meetings, dates discussed:    Additional Comments:  Elliot CousinShavis, Amethyst Gainer Ellen, RN 09/22/2015, 12:01 PM

## 2015-09-22 NOTE — Progress Notes (Signed)
*  PRELIMINARY RESULTS* Echocardiogram 2D Echocardiogram has been performed.  Jeryl Columbialliott, Aldo Sondgeroth 09/22/2015, 3:49 PM

## 2015-09-22 NOTE — Progress Notes (Addendum)
[facility] PROGRESS NOTE  Madison Odom QMV:[id number] DOB: August 29, 1934 DOA: 09/21/2015 PCP: Madison Odom, Tom, MD  Length of stay:    Assessment/Plan: Active Problems:   Hypercholesterolemia   Kyphoscoliosis   Pelvic fracture (HCC)   Closed fracture of pelvis (HCC)   Minimally displaced right sacral fracture -EDP discussed with Madison Odom, he recommends WB as tolerated , will continue patient on Robaxin and oxycodone with prn Dilaudid for pain control PT/OT evaluation pending Telemetry monitoring for 24 hours, will discontinue telemetry as the patient shows normal sinus rhythm after 2-D echo is completed and resulted Consult social work for possible placement  Abnormal troponin, not sure this is clinically significant in the setting of no chest pain and stable troponins 2 2-D echo to rule out wall motion abnormalities, given age and comorbidities, if 2-D echo is normal, no further cardiac workup indicated. Will start patient on aspirin 81 mg a day and continue nadolol  Leukocytosis-likely stress margination, will check chest x-ray, UA Patient afebrile overnight   Hypercholesterolemia-continue Zocor   Kyphoscoliosis  Mitral valve prolapse-continue nadolol  DVT prophylaxsis Lovenox  Code Status:      Code Status Orders        Start     Ordered   09/21/15 2323  Full code   Continuous     09/21/15 2323     Family Communication: family updated about patient's clinical progress Disposition Plan: PT/OT evaluation pending, social work consulted for possible placement  Brief narrative: 81 year old female without any significant past medical history other than mitral valve prolapse, dyslipidemia, followed by Madison Clementhomas Brackbill, MD , who presents to the ER because of inability to walk and complains of right sided pelvic pain and groin pain. Patient had a mechanical fall 2 weeks ago when she tripped backwards and fell on cement, to not hit her head completely lose  consciousness. Denied any prior chest pain dizziness or palpitations prior to the fall. Patient was seen at orthopedic urgent care on 11/5 and she was treated with a steroid injection in her right buttock after which the pain improved. Patient was able to ambulate over the weekend but started developing worsening pain with walking and unable to lift her right leg off the bed. Patient was brought in for pain control.CT scan imaging confirms that the patient has displaced fractures of the inferior and superior pubic rami on the left as well as a right sacral fracture. The right hip joint appears normal. EDP discussed with Madison Odom recommends WB as tolerated - pt can't go home without appropriate assistance as she can't ambulate safely - requires eval for rehab    Consultants:  None  Procedures:  None  Antibiotics: Anti-infectives    None         HPI/Subjective: Patient seems to be improved hemodynamically stable overnight, denies any chest pain or shortness of breath  Objective: Filed Vitals:   09/21/15 2230 09/21/15 2245 09/21/15 2300 09/21/15 2321  BP: 129/50 129/60 131/59 126/49  Pulse: 73 80 82 78  Temp:    98.6 F (37 C)  TempSrc:    Oral  Resp:    17  SpO2: 94%   92%    Intake/Output Summary (Last 24 hours) at 09/22/15 1050 Last data filed at 09/22/15 0600  Gross per 24 hour  Intake 300.83 ml  Output      0 ml  Net 300.83 ml    Exam:  General: No acute respiratory distress Lungs: Clear to  auscultation bilaterally without wheezes or crackles Cardiovascular: Regular rate and rhythm without murmur gallop or rub normal S1 and S2 Abdomen: Nontender, nondistended, soft, bowel sounds positive, no rebound, no ascites, no appreciable mass Extremities: No significant cyanosis, clubbing, or edema bilateral lower extremities     Data Review   Micro Results No results found for this or any previous visit (from the past 240 hour(s)).  Radiology Reports Ct Hip Right  Wo Contrast  09/21/2015  CLINICAL DATA:  Right hip pain after fall this weekend. EXAM: CT OF THE RIGHT HIP WITHOUT CONTRAST TECHNIQUE: Multidetector CT imaging of the right hip was performed according to the standard protocol. Multiplanar CT image reconstructions were also generated. COMPARISON:  None.  No radiographs available. FINDINGS: Mildly displaced fracture of the right sacrum, lateral to the sacral foramen. Comminuted minimally displaced fractures of the LEFT inferior and superior pubic rami, only partially included in the field of view. No extension of the pubic symphysis. Right pubic rami appear intact. Bony under mineralization. Right femoral head is well seated in the acetabulum, no definite right hip fracture. 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 IMPRESSION: 1. Minimally displaced right sacral fracture lateral to the sacral foramen. 2. Comminuted mildly displaced fractures of the LEFT inferior and superior pubic rami, partially included in the field of view. 3. Right hip joint appears intact in this patient with osteoporosis. Electronically Signed   By: Rubye Oaks M.D.   On: 09/21/2015 19:53     CBC  Recent Labs Lab 09/21/15 2140 09/21/15 2150 09/22/15 0031 09/22/15 0511  WBC 14.1*  --  12.5* 10.6*  HGB 10.6* 12.2 9.8* 9.5*  HCT 34.2* 36.0 31.9* 31.3*  PLT 301  --  293 281  MCV 79.5  --  79.9 79.6  MCH 24.7*  --  24.6* 24.2*  MCHC 31.0  --  30.7 30.4  RDW 17.3*  --  17.5* 17.4*    Chemistries   Recent Labs Lab 09/21/15 2150 09/22/15 0031 09/22/15 0511  NA 137  --  138  K 3.8  --  3.7  CL 102  --  105  CO2  --   --  26  GLUCOSE 110*  --  99  BUN 27*  --  20  CREATININE 0.40* 0.47 0.43*  CALCIUM  --   --  8.3*  MG  --  1.8  --   AST  --   --  30  ALT  --   --  23  ALKPHOS  --   --  108  BILITOT  --   --  0.4    ------------------------------------------------------------------------------------------------------------------ CrCl cannot be calculated (Unknown ideal weight.). ------------------------------------------------------------------------------------------------------------------ No results for input(s): HGBA1C in the last 72 hours. ------------------------------------------------------------------------------------------------------------------ No results for input(s): CHOL, HDL, LDLCALC, TRIG, CHOLHDL, LDLDIRECT in the last 72 hours. ------------------------------------------------------------------------------------------------------------------  Recent Labs  09/22/15 0031  TSH 0.734   ------------------------------------------------------------------------------------------------------------------ No results for input(s): VITAMINB12, FOLATE, FERRITIN, TIBC, IRON, RETICCTPCT in the last 72 hours.  Coagulation profile No results for input(s): INR, PROTIME in the last 168 hours.  No results for input(s): DDIMER in the last 72 hours.  Cardiac Enzymes  Recent Labs Lab 09/22/15 0031 09/22/15 0511  TROPONINI 0.06* 0.08*   ------------------------------------------------------------------------------------------------------------------ Invalid input(s): POCBNP   CBG: No results for input(s): GLUCAP in the last 168 hours.     Studies: Ct Hip Right Wo Contrast  09/21/2015  CLINICAL DATA:  Right hip pain after fall this weekend.  EXAM: CT OF THE RIGHT HIP WITHOUT CONTRAST TECHNIQUE: Multidetector CT imaging of the right hip was performed according to the standard protocol. Multiplanar CT image reconstructions were also generated. COMPARISON:  None.  No radiographs available. FINDINGS: Mildly displaced fracture of the right sacrum, lateral to the sacral foramen. Comminuted minimally displaced fractures of the LEFT inferior and superior pubic rami, only partially included in the  field of view. No extension of the pubic symphysis. Right pubic rami appear intact. Bony under mineralization. Right femoral head is well seated in the acetabulum, no definite right hip fracture. 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 IMPRESSION: 1. Minimally displaced right sacral fracture lateral to the sacral foramen. 2. Comminuted mildly displaced fractures of the LEFT inferior and superior pubic rami, partially included in the field of view. 3. Right hip joint appears intact in this patient with osteoporosis. Electronically Signed   By: Rubye Oaks M.D.   On: 09/21/2015 19:53      No results found for: HGBA1C Lab Results  Component Value Date   LDLCALC 80 05/25/2015   CREATININE 0.43* 09/22/2015       Scheduled Meds: . aspirin EC  81 mg Oral Daily  . calcium-vitamin D   Oral Daily  . enoxaparin (LOVENOX) injection  20 mg Subcutaneous Q24H  . multivitamin with minerals  1 tablet Oral Daily  . nadolol  80 mg Oral Daily  . simvastatin  20 mg Oral Daily  . sodium chloride  3 mL Intravenous Q12H   Continuous Infusions: . sodium chloride 50 mL/hr at 09/21/15 2359    Active Problems:   Hypercholesterolemia   Kyphoscoliosis   Pelvic fracture (HCC)   Closed fracture of pelvis (HCC)    Time spent: 45 minutes   [facility]  [facility] Pager [phone]. If 7PM-7AM, please contact night-coverage at [email or web address], password [facility] 09/22/2015, 10:50 AM          Leukocytosis

## 2015-09-23 ENCOUNTER — Encounter (HOSPITAL_COMMUNITY): Payer: Self-pay | Admitting: General Practice

## 2015-09-23 DIAGNOSIS — S329XXA Fracture of unspecified parts of lumbosacral spine and pelvis, initial encounter for closed fracture: Secondary | ICD-10-CM | POA: Diagnosis not present

## 2015-09-23 DIAGNOSIS — M419 Scoliosis, unspecified: Secondary | ICD-10-CM | POA: Diagnosis not present

## 2015-09-23 DIAGNOSIS — S3210XA Unspecified fracture of sacrum, initial encounter for closed fracture: Secondary | ICD-10-CM | POA: Diagnosis not present

## 2015-09-23 DIAGNOSIS — E7800 Pure hypercholesterolemia, unspecified: Secondary | ICD-10-CM | POA: Diagnosis not present

## 2015-09-23 MED ORDER — OXYCODONE HCL 5 MG PO TABS: 5.0000 mg | ORAL_TABLET | ORAL | Status: DC | PRN

## 2015-09-23 MED ORDER — DOCUSATE SODIUM 100 MG PO CAPS: 100.0000 mg | ORAL_CAPSULE | Freq: Two times a day (BID) | ORAL | Status: DC

## 2015-09-23 MED ORDER — METHOCARBAMOL 500 MG PO TABS: 500.0000 mg | ORAL_TABLET | Freq: Three times a day (TID) | ORAL | Status: DC | PRN

## 2015-09-23 MED ORDER — SENNA 15 MG PO TABS: 1.0000 | ORAL_TABLET | Freq: Every morning | ORAL | Status: DC

## 2015-09-23 NOTE — NC FL2 (Signed)
[place] MEDICAID FL2 LEVEL OF CARE SCREENING TOOL     IDENTIFICATION  Patient Name: Madison Odom Birthdate: 01-Jan-1934 Sex: female Admission Date (Current Location): 09/21/2015  [facility] and [place] Number:     Facility and Address:  [facility] [place], [address] [place], [place] [address]      Provider Number: [id number]  Attending Physician Name and Address:  Richarda Overlie, MD  Relative Name and Phone Number:  Vennie Homans, [phone]    Current Level of Care: Hospital Recommended Level of Care: Skilled Nursing Facility Prior Approval Number:    Date Approved/Denied:   PASRR Number:  [id number]   Discharge Plan: SNF    Current Diagnoses: Patient Active Problem List   Diagnosis Date Noted  . Pelvic fracture (HCC) 09/21/2015  . Closed fracture of pelvis (HCC)   . Kyphoscoliosis 09/16/2013  . Mitral valve prolapse 05/24/2011  . Hypercholesterolemia 05/24/2011    Orientation ACTIVITIES/SOCIAL BLADDER RESPIRATION    Self, Time, Situation, Place  Family supportive Incontinent ([place]) Normal  BEHAVIORAL SYMPTOMS/MOOD NEUROLOGICAL BOWEL NUTRITION STATUS  Other (Comment) (n/a)  (n/a) Continent Diet (Carb modified, please see discharge summary for any changes.)  PHYSICIAN VISITS COMMUNICATION OF NEEDS Height & Weight Skin    Verbally  (Not documented.) 82 lbs. Normal          AMBULATORY STATUS RESPIRATION    Assist independent Normal      Personal Care Assistance Level of Assistance  Feeding, Dressing, Bathing Bathing Assistance: Limited assistance Feeding assistance: Limited assistance Dressing Assistance: Limited assistance      Functional Limitations Info   (n/a)             SPECIAL CARE FACTORS FREQUENCY  PT (By licensed PT), OT (By licensed OT)     PT Frequency: 5 OT Frequency: 5           Additional Factors Info  Code Status, Allergies Code Status Info: FULL Code Allergies Info: Penicillins, Phenothiazines,  Tetracyclines & Related           Current Medications (09/23/2015): Current Facility-Administered Medications  Medication Dose Route Frequency Provider Last Rate Last Dose  . 0.9 %  sodium chloride infusion   Intravenous Continuous Richarda Overlie, MD 50 mL/hr at 09/22/15 2037    . acetaminophen (TYLENOL) tablet 650 mg  650 mg Oral Q6H PRN Richarda Overlie, MD       Or  . acetaminophen (TYLENOL) suppository 650 mg  650 mg Rectal Q6H PRN Richarda Overlie, MD      . aspirin EC tablet 81 mg  81 mg Oral Daily Richarda Overlie, MD   81 mg at 09/23/15 1022  . calcium-vitamin D (OSCAL WITH D) 500-200 MG-UNIT per tablet   Oral Daily Richarda Overlie, MD   1 tablet at 09/23/15 1022  . enoxaparin (LOVENOX) injection 20 mg  20 mg Subcutaneous Q24H Richarda Overlie, MD   20 mg at 09/23/15 1023  . HYDROmorphone (DILAUDID) injection 0.5 mg  0.5 mg Intravenous Q4H PRN Richarda Overlie, MD   0.5 mg at 09/22/15 1149  . levalbuterol (XOPENEX) nebulizer solution 0.63 mg  0.63 mg Nebulization Q6H PRN Richarda Overlie, MD      . methocarbamol (ROBAXIN) 500 mg in dextrose 5 % 50 mL IVPB  500 mg Intravenous Q8H PRN Richarda Overlie, MD   500 mg at 09/23/15 0024  . multivitamin with minerals tablet 1 tablet  1 tablet Oral Daily Richarda Overlie, MD   1 tablet at 09/23/15 1022  .  nadolol (CORGARD) tablet 80 mg  80 mg Oral Daily Richarda OverlieNayana Abrol, MD   80 mg at 09/23/15 1022  . ondansetron (ZOFRAN) tablet 4 mg  4 mg Oral Q6H PRN Richarda OverlieNayana Abrol, MD       Or  . ondansetron (ZOFRAN) injection 4 mg  4 mg Intravenous Q6H PRN Richarda OverlieNayana Abrol, MD      . oxyCODONE (Oxy IR/ROXICODONE) immediate release tablet 5 mg  5 mg Oral Q4H PRN Richarda OverlieNayana Abrol, MD   5 mg at 09/22/15 0658  . simvastatin (ZOCOR) tablet 20 mg  20 mg Oral Daily Richarda OverlieNayana Abrol, MD   20 mg at 09/22/15 1954  . sodium chloride 0.9 % injection 3 mL  3 mL Intravenous Q12H Richarda OverlieNayana Abrol, MD   3 mL at 09/21/15 2359   Do not use this list as official medication orders. Please verify with discharge  summary.  Discharge Medications:   Medication List    TAKE these medications        aspirin 81 MG EC tablet  Take 81 mg by mouth daily.     CALCIUM 500 +D PO  Take 1 tablet by mouth daily.     methocarbamol 500 MG tablet  Commonly known as:  ROBAXIN  Take 1 tablet (500 mg total) by mouth every 8 (eight) hours as needed for muscle spasms.     multivitamin tablet  Take 1 tablet by mouth daily.     nadolol 80 MG tablet  Commonly known as:  CORGARD  TAKE 1 TABLET BY MOUTH EVERY DAY     oxyCODONE 5 MG immediate release tablet  Commonly known as:  Oxy IR/ROXICODONE  Take 1 tablet (5 mg total) by mouth every 4 (four) hours as needed for moderate pain.     simvastatin 20 MG tablet  Commonly known as:  ZOCOR  TAKE 1 TABLET DAILY        Relevant Imaging Results:  Relevant Lab Results:  Recent Labs    Additional Information Social Security #: [id number]  Rojelio BrennerVaughn, Yarlin Breisch S, LCSW [phone]

## 2015-09-23 NOTE — Discharge Planning (Signed)
[facility] to [facility]. [name], Marisue IvanLiz, updated via phone.  [facility] RN report number: [phone] Transportation: EMS  Marcelline Deistmily Maddilynn Esperanza, [place] [phone] Orthopedics: [phone] Surgical: [phone]

## 2015-09-23 NOTE — Progress Notes (Signed)
Called report to [facility].  All belongings with patient's daughter.  IV removed prior to discharge.  PTAR transported to facility.  No complaints of pain at discharge.  Prescription and discharge instructions with patient discharge packet.

## 2015-09-23 NOTE — Evaluation (Signed)
Occupational Therapy Evaluation Patient Details Name: Madison Odom A [place] MRN: [id number] DOB: 18-Aug-1934 Today's Date: 09/23/2015    History of Present Illness 81 yo female with onset of fall, struggled to walk then discovered R sacral and L inferior and superior pubic rami fractures.  Also elevated troponin, leukocytosis, scoliosis.   Clinical Impression   Pt reports she was independent with ADLs PTA. Currently pt is set up for ADLs in sitting, mod A for LB ADLs, and mod +2 for safety with functional mobility. Pt reports that she feels weaker today and was unable to take a step once in standing. Recommending SNF at this time to maximize independence and safety with ADLs and mobility prior to return home. Pt would benefit from continued skilled OT in order to increase independence and safety with LB ADLs, toilet, and walk in shower transfers.     Follow Up Recommendations  SNF (If pt is unable to go SNF, will need HHOT )    Equipment Recommendations  Other (comment) (TBD)    Recommendations for Other Services       Precautions / Restrictions Precautions Precautions: Fall (telemetry) Restrictions Weight Bearing Restrictions: Yes Other Position/Activity Restrictions: WBAT      Mobility Bed Mobility Overal bed mobility: Needs Assistance Bed Mobility: Supine to Sit;Sit to Supine     Supine to sit: Min assist Sit to supine: Min assist   General bed mobility comments: Min A to mange LEs. Pt with good support of trunk. Good hand placement and technique.   Transfers Overall transfer level: Needs assistance Equipment used: Rolling walker (2 wheeled) Transfers: Sit to/from Stand Sit to Stand: Mod assist         General transfer comment: Mod A to boost up from EOB. VC for hand placement. Sit <> stand from EOB x 1. Pt reports she feels weak today and is unable to take a step.    Balance Overall balance assessment: Needs assistance Sitting-balance support: Feet  supported Sitting balance-Leahy Scale: Fair     Standing balance support: Bilateral upper extremity supported Standing balance-Leahy Scale: Poor Standing balance comment: RW for support                            ADL Overall ADL's : Needs assistance/impaired Eating/Feeding: Set up;Sitting   Grooming: Set up;Sitting       Lower Body Bathing: Moderate assistance;Sit to/from stand       Lower Body Dressing: Moderate assistance;Sit to/from stand   Toilet Transfer: Moderate assistance;Stand-pivot;BSC;RW;+2 for safety/equipment Toilet Transfer Details (indicate cue type and reason): Mod A to boost up, +2 would be helpful for safety. Pt reports she feels weaker than yesterday and is having difficutly taking steps this am.          Functional mobility during ADLs: Moderate assistance;+2 for safety/equipment;Rolling walker General ADL Comments: Pts daughter present for OT session. Pt reports she feels weaker than yesterday and is having difficutly taking steps this am.      Vision     Perception     Praxis      Pertinent Vitals/Pain Pain Assessment: Faces Faces Pain Scale: Hurts little more Pain Location: sciatic pain Pain Intervention(s): Limited activity within patient's tolerance;Monitored during session;Repositioned;Heat applied     Hand Dominance     Extremity/Trunk Assessment Upper Extremity Assessment Upper Extremity Assessment: Overall WFL for tasks assessed   Lower Extremity Assessment Lower Extremity Assessment: Defer to PT evaluation   Cervical / Trunk  Assessment Cervical / Trunk Assessment: Kyphotic   Communication Communication Communication: No difficulties   Cognition Arousal/Alertness: Awake/alert Behavior During Therapy: WFL for tasks assessed/performed Overall Cognitive Status: Within Functional Limits for tasks assessed                     General Comments       Exercises       Shoulder Instructions      Home  Living Family/patient expects to be discharged to:: Skilled nursing facility Living Arrangements: Alone Available Help at Discharge: Other (Comment) (none) Type of Home: House       Home Layout: Two level     Bathroom Shower/Tub: Producer, television/film/video: Standard     Home Equipment: Shower seat - built in;Walker - 2 wheels          Prior Functioning/Environment Level of Independence: Independent        Comments: got a walker with wheels since her fall with RLE pain    OT Diagnosis: Generalized weakness;Acute pain   OT Problem List: Decreased activity tolerance;Impaired balance (sitting and/or standing);Decreased knowledge of use of DME or AE;Decreased knowledge of precautions;Pain   OT Treatment/Interventions: Self-care/ADL training;DME and/or AE instruction;Patient/family education    OT Goals(Current goals can be found in the care plan section) Acute Rehab OT Goals Patient Stated Goal: to go to rehab before going home OT Goal Formulation: With patient Time For Goal Achievement: 10/07/15 Potential to Achieve Goals: Good ADL Goals Pt Will Perform Grooming: with min guard assist;standing Pt Will Perform Lower Body Bathing: with min guard assist;sit to/from stand (with or without AE) Pt Will Perform Lower Body Dressing: with min guard assist;sit to/from stand (with or without AE) Pt Will Transfer to Toilet: with min guard assist;ambulating;bedside commode (BSC over toilet) Pt Will Perform Toileting - Clothing Manipulation and hygiene: with min guard assist;sit to/from stand Pt Will Perform Tub/Shower Transfer: Shower transfer;with min guard assist;ambulating;shower seat;rolling walker  OT Frequency: Min 2X/week   Barriers to D/C: Inaccessible home environment;Decreased caregiver support          Co-evaluation              End of Session Equipment Utilized During Treatment: Gait belt;Rolling walker  Activity Tolerance: Patient tolerated treatment  well Patient left: in bed;with call bell/phone within reach;with bed alarm set   Time: 0850-0905 OT Time Calculation (min): 15 min Charges:  OT General Charges $OT Visit: 1 Procedure OT Evaluation $Initial OT Evaluation Tier I: 1 Procedure G-Codes: OT G-codes **NOT FOR INPATIENT CLASS** Functional Assessment Tool Used: Clinical judgement Functional Limitation: Self care Self Care Current Status (Y7829): At least 40 percent but less than 60 percent impaired, limited or restricted Self Care Goal Status (F6213): At least 20 percent but less than 40 percent impaired, limited or restricted   Gaye Alken M.S., OTR/L Pager: [phone]  09/23/2015, 9:27 AM

## 2015-09-23 NOTE — Discharge Summary (Addendum)
Physician Discharge Summary  Madison Odom MRN: [id number] DOB/AGE: 07-16-1934 81 y.o.  PCP: Warren Danes, MD   Admit date: 09/21/2015 Discharge date: 09/23/2015  Discharge Diagnoses:     Active Problems:   Hypercholesterolemia   Kyphoscoliosis   Pelvic fracture (HCC)   Closed fracture of pelvis (HCC)    Follow-up recommendations Follow-up with PCP in 3-5 days , including all  additional recommended appointments as below Follow-up CBC, CMP in 3-5 days Dc 'd with foley due to urinary retention in the setting of pelvic pain, this may be removed in 3-5 days       Medication List    TAKE these medications        aspirin 81 MG EC tablet  Take 81 mg by mouth daily.     CALCIUM 500 +D PO  Take 1 tablet by mouth daily.     docusate sodium 100 MG capsule  Commonly known as:  COLACE  Take 1 capsule (100 mg total) by mouth 2 (two) times daily.     methocarbamol 500 MG tablet  Commonly known as:  ROBAXIN  Take 1 tablet (500 mg total) by mouth every 8 (eight) hours as needed for muscle spasms.     multivitamin tablet  Take 1 tablet by mouth daily.     nadolol 80 MG tablet  Commonly known as:  CORGARD  TAKE 1 TABLET BY MOUTH EVERY DAY     Senna 15 MG Tabs  Take 1 tablet (15 mg total) by mouth every morning.     simvastatin 20 MG tablet  Commonly known as:  ZOCOR  TAKE 1 TABLET DAILY          Discharge Condition: Stable Discharge Instructions       Discharge Instructions    Diet - low sodium heart healthy    Complete by:  As directed      Increase activity slowly    Complete by:  As directed            Allergies  Allergen Reactions  . Penicillins     Yeast infections  . Phenothiazines   . Tetracyclines & Related       Disposition: SNF   Consults:  None     Significant Diagnostic Studies:  Ct Hip Right Wo Contrast  09/21/2015  CLINICAL DATA:  Right hip pain after fall this weekend. EXAM: CT OF THE RIGHT HIP WITHOUT CONTRAST  TECHNIQUE: Multidetector CT imaging of the right hip was performed according to the standard protocol. Multiplanar CT image reconstructions were also generated. COMPARISON:  None.  No radiographs available. FINDINGS: Mildly displaced fracture of the right sacrum, lateral to the sacral foramen. Comminuted minimally displaced fractures of the LEFT inferior and superior pubic rami, only partially included in the field of view. No extension of the pubic symphysis. Right pubic rami appear intact. Bony under mineralization. Right femoral head is well seated in the acetabulum, no definite right hip fracture. 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 IMPRESSION: 1. Minimally displaced right sacral fracture lateral to the sacral foramen. 2. Comminuted mildly displaced fractures of the LEFT inferior and superior pubic rami, partially included in the field of view. 3. Right hip joint appears intact in this patient with osteoporosis. Electronically Signed   By: Jeb Levering M.D.   On: 09/21/2015 19:53   Dg Chest Port 1 View  09/22/2015  CLINICAL DATA:  Leukocytosis EXAM: PORTABLE CHEST 1 VIEW COMPARISON:  None. FINDINGS: Significant  rotation. Allowing for this, lungs appeared clear except for mild right base opacity. Heart size mildly enlarged. Vascular pattern normal. Aortic calcification. IMPRESSION: Mildly increased markings right lower lobe likely exaggerated by patient rotation. Finding is likely related to atelectasis. Developing pneumonia difficult to exclude. Radiographic follow-up suggested. Electronically Signed   By: Skipper Cliche M.D.   On: 09/22/2015 13:04    2-D echo LV EF: 60% -  65%  ------------------------------------------------------------------- Indications:   Dyspnea 786.09.  ------------------------------------------------------------------- History:  PMH: Fall, Mitral Valve Prolapse. Risk  factors: Dyslipidemia.  ------------------------------------------------------------------- Study Conclusions  - Left ventricle: The cavity size was normal. Wall thickness was increased in a pattern of mild LVH. Systolic function was normal. The estimated ejection fraction was in the range of 60% to 65%. Wall motion was normal; there were no regional wall motion abnormalities. Doppler parameters are consistent with abnormal left ventricular relaxation (grade 1 diastolic dysfunction). - Aortic valve: There was trivial regurgitation. - Mitral valve: Prolapse cannot be excluded. - Tricuspid valve: There was moderate regurgitation. - Pulmonary arteries: PA peak pressure: 62 mm Hg (S).    There were no vitals filed for this visit.   Microbiology: No results found for this or any previous visit (from the past 240 hour(s)).     Blood Culture No results found for: SDES, [place], CULT, REPTSTATUS    Labs: Results for orders placed or performed during the hospital encounter of 09/21/15 (from the past 48 hour(s))  CBC     Status: Abnormal   Collection Time: 09/21/15  9:40 PM  Result Value Ref Range   WBC 14.1 (H) 4.0 - 10.5 K/uL   RBC 4.30 3.87 - 5.11 MIL/uL   Hemoglobin 10.6 (L) 12.0 - 15.0 g/dL   HCT 34.2 (L) 36.0 - 46.0 %   MCV 79.5 78.0 - 100.0 fL   MCH 24.7 (L) 26.0 - 34.0 pg   MCHC 31.0 30.0 - 36.0 g/dL   RDW 17.3 (H) 11.5 - 15.5 %   Platelets 301 150 - 400 K/uL  I-stat chem 8, ed     Status: Abnormal   Collection Time: 09/21/15  9:50 PM  Result Value Ref Range   Sodium 137 135 - 145 mmol/L   Potassium 3.8 3.5 - 5.1 mmol/L   Chloride 102 101 - 111 mmol/L   BUN 27 (H) 6 - 20 mg/dL   Creatinine, Ser 0.40 (L) 0.44 - 1.00 mg/dL   Glucose, Bld 110 (H) 65 - 99 mg/dL   Calcium, Ion 1.12 (L) 1.13 - 1.30 mmol/L   TCO2 22 0 - 100 mmol/L   Hemoglobin 12.2 12.0 - 15.0 g/dL   HCT 36.0 36.0 - 46.0 %  CBC     Status: Abnormal   Collection Time: 09/22/15 12:31 AM   Result Value Ref Range   WBC 12.5 (H) 4.0 - 10.5 K/uL   RBC 3.99 3.87 - 5.11 MIL/uL   Hemoglobin 9.8 (L) 12.0 - 15.0 g/dL   HCT 31.9 (L) 36.0 - 46.0 %   MCV 79.9 78.0 - 100.0 fL   MCH 24.6 (L) 26.0 - 34.0 pg   MCHC 30.7 30.0 - 36.0 g/dL   RDW 17.5 (H) 11.5 - 15.5 %   Platelets 293 150 - 400 K/uL  Creatinine, serum     Status: None   Collection Time: 09/22/15 12:31 AM  Result Value Ref Range   Creatinine, Ser 0.47 0.44 - 1.00 mg/dL   GFR calc non Af Amer >60 >60 mL/min   GFR  calc Af Amer >60 >60 mL/min    Comment: (NOTE) The eGFR has been calculated using the CKD EPI equation. This calculation has not been validated in all clinical situations. eGFR's persistently <60 mL/min signify possible Chronic Kidney Disease.   Magnesium     Status: None   Collection Time: 09/22/15 12:31 AM  Result Value Ref Range   Magnesium 1.8 1.7 - 2.4 mg/dL  TSH     Status: None   Collection Time: 09/22/15 12:31 AM  Result Value Ref Range   TSH 0.734 0.350 - 4.500 uIU/mL  Troponin I     Status: Abnormal   Collection Time: 09/22/15 12:31 AM  Result Value Ref Range   Troponin I 0.06 (H) <0.031 ng/mL    Comment:        PERSISTENTLY INCREASED TROPONIN VALUES IN THE RANGE OF 0.04-0.49 ng/mL CAN BE SEEN IN:       -UNSTABLE ANGINA       -CONGESTIVE HEART FAILURE       -MYOCARDITIS       -CHEST TRAUMA       -ARRYHTHMIAS       -LATE PRESENTING MYOCARDIAL INFARCTION       -COPD   CLINICAL FOLLOW-UP RECOMMENDED.   Troponin I     Status: Abnormal   Collection Time: 09/22/15  5:11 AM  Result Value Ref Range   Troponin I 0.08 (H) <0.031 ng/mL    Comment:        PERSISTENTLY INCREASED TROPONIN VALUES IN THE RANGE OF 0.04-0.49 ng/mL CAN BE SEEN IN:       -UNSTABLE ANGINA       -CONGESTIVE HEART FAILURE       -MYOCARDITIS       -CHEST TRAUMA       -ARRYHTHMIAS       -LATE PRESENTING MYOCARDIAL INFARCTION       -COPD   CLINICAL FOLLOW-UP RECOMMENDED.   CBC     Status: Abnormal   Collection  Time: 09/22/15  5:11 AM  Result Value Ref Range   WBC 10.6 (H) 4.0 - 10.5 K/uL   RBC 3.93 3.87 - 5.11 MIL/uL   Hemoglobin 9.5 (L) 12.0 - 15.0 g/dL   HCT 31.3 (L) 36.0 - 46.0 %   MCV 79.6 78.0 - 100.0 fL   MCH 24.2 (L) 26.0 - 34.0 pg   MCHC 30.4 30.0 - 36.0 g/dL   RDW 17.4 (H) 11.5 - 15.5 %   Platelets 281 150 - 400 K/uL  Comprehensive metabolic panel     Status: Abnormal   Collection Time: 09/22/15  5:11 AM  Result Value Ref Range   Sodium 138 135 - 145 mmol/L   Potassium 3.7 3.5 - 5.1 mmol/L   Chloride 105 101 - 111 mmol/L   CO2 26 22 - 32 mmol/L   Glucose, Bld 99 65 - 99 mg/dL   BUN 20 6 - 20 mg/dL   Creatinine, Ser 0.43 (L) 0.44 - 1.00 mg/dL   Calcium 8.3 (L) 8.9 - 10.3 mg/dL   Total Protein 5.3 (L) 6.5 - 8.1 g/dL   Albumin 2.5 (L) 3.5 - 5.0 g/dL   AST 30 15 - 41 U/L   ALT 23 14 - 54 U/L   Alkaline Phosphatase 108 38 - 126 U/L   Total Bilirubin 0.4 0.3 - 1.2 mg/dL   GFR calc non Af Amer >60 >60 mL/min   GFR calc Af Amer >60 >60 mL/min    Comment: (NOTE) The eGFR has  been calculated using the CKD EPI equation. This calculation has not been validated in all clinical situations. eGFR's persistently <60 mL/min signify possible Chronic Kidney Disease.    Anion gap 7 5 - 15  Troponin I     Status: Abnormal   Collection Time: 09/22/15 11:23 AM  Result Value Ref Range   Troponin I 0.07 (H) <0.031 ng/mL    Comment:        PERSISTENTLY INCREASED TROPONIN VALUES IN THE RANGE OF 0.04-0.49 ng/mL CAN BE SEEN IN:       -UNSTABLE ANGINA       -CONGESTIVE HEART FAILURE       -MYOCARDITIS       -CHEST TRAUMA       -ARRYHTHMIAS       -LATE PRESENTING MYOCARDIAL INFARCTION       -COPD   CLINICAL FOLLOW-UP RECOMMENDED.   Urinalysis, Routine w reflex microscopic (not at [facility])     Status: Abnormal   Collection Time: 09/22/15 11:53 AM  Result Value Ref Range   Color, Urine YELLOW YELLOW   APPearance CLEAR CLEAR   Specific Gravity, Urine 1.024 1.005 - 1.030   pH 6.5 5.0 - 8.0    Glucose, UA 100 (A) NEGATIVE mg/dL   Hgb urine dipstick NEGATIVE NEGATIVE   Bilirubin Urine NEGATIVE NEGATIVE   Ketones, ur 15 (A) NEGATIVE mg/dL   Protein, ur NEGATIVE NEGATIVE mg/dL   Urobilinogen, UA 0.2 0.0 - 1.0 mg/dL   Nitrite NEGATIVE NEGATIVE   Leukocytes, UA NEGATIVE NEGATIVE    Comment: MICROSCOPIC NOT DONE ON URINES WITH NEGATIVE PROTEIN, BLOOD, LEUKOCYTES, NITRITE, OR GLUCOSE <1000 mg/dL.     Lipid Panel     Component Value Date/Time   CHOL 166 05/25/2015 0759   TRIG 72.0 05/25/2015 0759   HDL 72.00 05/25/2015 0759   CHOLHDL 2 05/25/2015 0759   VLDL 14.4 05/25/2015 0759   LDLCALC 80 05/25/2015 0759     No results found for: HGBA1C   Lab Results  Component Value Date   LDLCALC 80 05/25/2015   CREATININE 0.43* 09/22/2015     HPI : 82 year old female without any significant past medical history other than mitral valve prolapse, dyslipidemia, followed by Darlin Coco, MD , who presents to the ER because of inability to walk and complains of right sided pelvic pain and groin pain. Patient had a mechanical fall 2 weeks ago when she tripped backwards and fell on cement, to not hit her head completely lose consciousness. Denied any prior chest pain dizziness or palpitations prior to the fall. Patient was seen at orthopedic urgent care on 11/5 and she was treated with a steroid injection in her right buttock after which the pain improved. Patient was able to ambulate over the weekend but started developing worsening pain with walking and unable to lift her right leg off the bed. Patient was brought in for pain control.CT scan imaging confirms that the patient has displaced fractures of the inferior and superior pubic rami on the left as well as a right sacral fracture. The right hip joint appears normal. EDP discussed with Dr. Mardelle Matte recommends WB as tolerated - pt can't go home without appropriate assistance as she can't ambulate safely - requires eval for rehab       HOSPITAL COURSE:  Minimally displaced right sacral fracture -EDP discussed with Dr.Landau, he recommends WB as tolerated , will continue patient on Robaxin and oxycodone, PT/OT evaluation recommended SNF Telemetry monitoring does not show any arrhythmia 2-D echo  with results as above, no regional wall motion abnormalities, EF is normal    Abnormal troponin, not sure this is clinically significant in the setting of no chest pain and stable troponins 3 2-D echo without wall motion abnormalities, given age and comorbidities, because 2-D echo is normal, no further cardiac workup indicated. Continue aspirin 81 mg a day and continue nadolol  Leukocytosis-likely stress margination, chest x-ray, UA negative Patient does not have any cardiopulmonary symptoms, no cough or fever     Hypercholesterolemia-continue Zocor   Kyphoscoliosis  Mitral valve prolapse-continue nadolol  Discharge Exam:    Blood pressure 152/75, pulse 61, temperature 98.2 F (36.8 C), temperature source Oral, resp. rate 16, SpO2 96 %.  General: No acute respiratory distress Lungs: Clear to auscultation bilaterally without wheezes or crackles Cardiovascular: Regular rate and rhythm without murmur gallop or rub normal S1 and S2 Abdomen: Nontender, nondistended, soft, bowel sounds positive, no rebound, no ascites, no appreciable mass Extremities: No significant cyanosis, clubbing, or edema bilateral lower extremities    Follow-up Information    Schedule an appointment as soon as possible for a visit with Warren Danes, MD.   Specialty:  Cardiology   Contact information:   [address] [place] [address] [phone]       Signed: Reyne Dumas 09/23/2015, 10:17 AM        Time spent >45 mins

## 2015-09-23 NOTE — Progress Notes (Signed)
[facility]nd [facility] unsuccessful.  Left message.  Will retry.

## 2015-09-23 NOTE — Clinical Social Work Placement (Signed)
   CLINICAL SOCIAL WORK PLACEMENT  NOTE  Date:  09/23/2015  Patient Details  Name: Madison Odom A [place] MRN: [id number] Date of Birth: Sep 03, 1934  Clinical Social Work is seeking post-discharge placement for this patient at the Skilled  Nursing Facility level of care (*CSW will initial, date and re-position this form in  chart as items are completed):  Yes   Patient/family provided with [facility] Clinical Social Work Department's list of facilities offering this level of care within the geographic area requested by the patient (or if unable, by the patient's family).  Yes   Patient/family informed of their freedom to choose among providers that offer the needed level of care, that participate in Medicare, Medicaid or managed care program needed by the patient, have an available bed and are willing to accept the patient.  Yes   Patient/family informed of [facility]'s ownership interest in [facility] and [facility], as well as of the fact that they are under no obligation to receive care at these facilities.  PASRR submitted to EDS on 09/23/15     PASRR number received on 09/23/15     Existing PASRR number confirmed on       FL2 transmitted to all facilities in geographic area requested by pt/family on 09/23/15     FL2 transmitted to all facilities within larger geographic area on       Patient informed that his/her managed care company has contracts with or will negotiate with certain facilities, including the following:        Yes   Patient/family informed of bed offers received.  [facility]     Physician recommends and patient chooses bed at      [facility].  Patient to be transferred to facility by [facility]     Patient family notified on 09/23/15 of transfer.  Name of family member notified:  Vennie HomansLiz Joyce     PHYSICIAN Please sign FL2     Additional Comment:     _______________________________________________ Rod MaeVaughn, Corrinne Benegas S, LCSW 09/23/2015, 11:09 AM

## 2015-09-23 NOTE — Clinical Social Work Note (Signed)
Clinical Social Work Assessment  Patient Details  Name: Madison Odom A [place] MRN: [id number] Date of Birth: 1934-06-02  Date of referral:  09/23/15               Reason for consult:  Facility Placement, Discharge Planning                Permission sought to share information with:  Facility Medical sales representativeContact Representative, Family Supports Permission granted to share information::  Yes, Verbal Permission Granted  Name::     Vennie HomansLiz Joyce  Agency::  [facility]  Relationship::  Daughter  Contact Information:  [phone]  Housing/Transportation Living arrangements for the past 2 months:  Single Family Home Source of Information:  Adult Children Patient Interpreter Needed:  None Criminal Activity/Legal Involvement Pertinent to Current Situation/Hospitalization:  No - Comment as needed Significant Relationships:  Adult Children Lives with:  Self Do you feel safe going back to the place where you live?  No (High fall risk.) Need for family participation in patient care:  Yes (Comment) ([name] active in patient's care.)  Care giving concerns:  [name] expressed no concerns at this time.   Social Worker assessment / plan:  CSW received referral for possible SNF placement at time of discharge. Patient with Medicare A/B which requires 3 midnight Inpatient status, however, patient is under Observation and does not qualify for SNF placement under Medicare guidelines. [name] informed of need to private pay for SNF placement if unable to have patient discharge home. [name] understanding and agreeable to privately pay for [facility] placement [facility]. CSW to continue to follow and assist with discharge planning needs.  Employment status:  Retired Health and safety inspectornsurance information:  Medicare PT Recommendations:  Skilled Nursing Facility Information / Referral to community resources:  Skilled Nursing Facility  Patient/Family's Response to care:  Patient's family understanding and  agreeable to CSW plan of care.  Patient/Family's Understanding of and Emotional Response to Diagnosis, Current Treatment, and Prognosis:  Patient's family understanding and agreeable to CSW plan of care.  Emotional Assessment Appearance:  Appears stated age Attitude/Demeanor/Rapport:  Other (Pleasant) Affect (typically observed):  Pleasant Orientation:  Oriented to Self, Oriented to Place, Oriented to  Time, Oriented to Situation Alcohol / Substance use:  Not Applicable Psych involvement (Current and /or in the community):  No (Comment) (Not appropriate on this admission.)  Discharge Needs  Concerns to be addressed:  No discharge needs identified Readmission within the last 30 days:  No Current discharge risk:  None Barriers to Discharge:  No Barriers Identified   Rod MaeVaughn, Ercell Perlman S, LCSW 09/23/2015, 11:06 AM [phone]

## 2015-09-27 ENCOUNTER — Encounter: Payer: Self-pay | Admitting: Adult Health

## 2015-09-27 ENCOUNTER — Non-Acute Institutional Stay (SKILLED_NURSING_FACILITY): Payer: Medicare Other | Admitting: Adult Health

## 2015-09-27 DIAGNOSIS — E7800 Pure hypercholesterolemia, unspecified: Secondary | ICD-10-CM | POA: Diagnosis not present

## 2015-09-27 DIAGNOSIS — D62 Acute posthemorrhagic anemia: Secondary | ICD-10-CM | POA: Insufficient documentation

## 2015-09-27 DIAGNOSIS — E43 Unspecified severe protein-calorie malnutrition: Secondary | ICD-10-CM | POA: Diagnosis not present

## 2015-09-27 DIAGNOSIS — I341 Nonrheumatic mitral (valve) prolapse: Secondary | ICD-10-CM | POA: Diagnosis not present

## 2015-09-27 DIAGNOSIS — S3210XS Unspecified fracture of sacrum, sequela: Secondary | ICD-10-CM

## 2015-09-27 NOTE — Progress Notes (Signed)
Patient ID: Madison Odom, female   DOB: [birth year], 81 y.o.   MRN: [id number]    DATE:  09/27/2015   MRN:  [id number]  BIRTHDAY: Dec 22, 1933  Facility:  Nursing Home Location:  [facility]  Nursing Home Room Number: 808-P  LEVEL OF CARE:  SNF (31)  Contact Information    Name Relation Home Work Mobile   [place] Daughter [phone]     [name],[name] Daughter   [phone]       Chief Complaint  Patient presents with  . Hospitalization Follow-up    Right sacral fracture, hypercholesterolemia, mitral valve prolapse, anemia and protein calorie malnutrition    HISTORY OF PRESENT ILLNESS:  This is a 81 year old female who has been admitted to [facility] on 09/22/17 from [facility]. She has PMH of mitral valve prolapse and dyslipidemia. She had a fall sustaining minimally displaced right sacral fracture. She has been admitted for a short-term rehabilitation.   PAST MEDICAL HISTORY:  Past Medical History  Diagnosis Date  . MVP (mitral valve prolapse)   . Hyperlipidemia   . OP (osteoporosis)   . Spastic colon   . Pelvis fracture (HCC) 09/2015    CLOSED      CURRENT MEDICATIONS: Reviewed  Patient's Medications  New Prescriptions   No medications on file  Previous Medications   ASPIRIN 81 MG EC TABLET    Take 81 mg by mouth daily.     CALCIUM CARB-CHOLECALCIFEROL (CALCIUM 500 +D PO)    Take 1 tablet by mouth daily.    DOCUSATE SODIUM (COLACE) 100 MG CAPSULE    Take 1 capsule (100 mg total) by mouth 2 (two) times daily.   METHOCARBAMOL (ROBAXIN) 500 MG TABLET    Take 1 tablet (500 mg total) by mouth every 8 (eight) hours as needed for muscle spasms.   MULTIPLE VITAMIN (MULTIVITAMIN) TABLET    Take 1 tablet by mouth daily.   NADOLOL (CORGARD) 80 MG TABLET    TAKE 1 TABLET BY MOUTH EVERY DAY   SENNOSIDES (SENNA) 15 MG TABS    Take 1 tablet (15 mg total) by mouth every morning.   SIMVASTATIN (ZOCOR) 20 MG TABLET    TAKE 1 TABLET DAILY    Modified Medications   No medications on file  Discontinued Medications   No medications on file     Allergies  Allergen Reactions  . Penicillins     Yeast infections  . Phenothiazines   . Tetracyclines & Related      REVIEW OF SYSTEMS:  GENERAL: no change in appetite, no fatigue, no weight changes, no fever, chills or weakness EYES: Denies change in vision, dry eyes, eye pain, itching or discharge EARS: Denies change in hearing, ringing in ears, or earache NOSE: Denies nasal congestion or epistaxis MOUTH and THROAT: Denies oral discomfort, gingival pain or bleeding, pain from teeth or hoarseness   RESPIRATORY: no cough, SOB, DOE, wheezing, hemoptysis CARDIAC: no chest pain, edema or palpitations GI: no abdominal pain, diarrhea, constipation, heart burn, nausea or vomiting GU: Denies dysuria, frequency, hematuria, incontinence, or discharge PSYCHIATRIC: Denies feeling of depression or anxiety. No report of hallucinations, insomnia, paranoia, or agitation   PHYSICAL EXAMINATION  GENERAL APPEARANCE: Well nourished. In no acute distress. Normal body habitus HEAD: Normal in size and contour. No evidence of trauma EYES: Lids open and close normally. No blepharitis, entropion or ectropion. PERRL. Conjunctivae are clear and sclerae are white. Lenses are without opacity EARS: Pinnae are normal. Patient hears  normal voice tunes of the examiner MOUTH and THROAT: Lips are without lesions. Oral mucosa is moist and without lesions. Tongue is normal in shape, size, and color and without lesions NECK: supple, trachea midline, no neck masses, no thyroid tenderness, no thyromegaly LYMPHATICS: no LAN in the neck, no supraclavicular LAN RESPIRATORY: breathing is even & unlabored, BS CTAB, +scoliosis CARDIAC: RRR, no murmur,no extra heart sounds, no edema GI: abdomen soft, normal BS, no masses, no tenderness, no hepatomegaly, no splenomegaly EXTREMITIES:  Able to move 4  extremities PSYCHIATRIC: Alert and oriented X 3. Affect and behavior are appropriate  LABS/RADIOLOGY: Labs reviewed: Basic Metabolic Panel:  Recent Labs  16/10/96 0834 05/25/15 0759 09/21/15 2150 09/22/15 0031 09/22/15 0511  NA 136 139 137  --  138  K 4.0 3.9 3.8  --  3.7  CL 104 103 102  --  105  CO2 26 30  --   --  26  GLUCOSE 94 88 110*  --  99  BUN 27* 23 27*  --  20  CREATININE 0.6 0.60 0.40* 0.47 0.43*  CALCIUM 9.2 9.6  --   --  8.3*  MG  --   --   --  1.8  --    Liver Function Tests:  Recent Labs  11/19/14 0834 05/25/15 0759 09/22/15 0511  AST ALT ALKPHOS 83 60 108  BILITOT 0.7 0.5 0.4  PROT 6.9 6.9 5.3*  ALBUMIN 4.0 4.0 2.5*   CBC:  Recent Labs  09/21/15 2140 09/21/15 2150 09/22/15 0031 09/22/15 0511  WBC 14.1*  --  12.5* 10.6*  HGB 10.6* 12.2 9.8* 9.5*  HCT 34.2* 36.0 31.9* 31.3*  MCV 79.5  --  79.9 79.6  PLT 301  --  293 281   Lipid Panel:  Recent Labs  11/19/14 0834 05/25/15 0759  HDL 68.30 72.00   Cardiac Enzymes:  Recent Labs  09/22/15 0031 09/22/15 0511 09/22/15 1123  TROPONINI 0.06* 0.08* 0.07*     Ct Hip Right Wo Contrast  09/21/2015  CLINICAL DATA:  Right hip pain after fall this weekend. EXAM: CT OF THE RIGHT HIP WITHOUT CONTRAST TECHNIQUE: Multidetector CT imaging of the right hip was performed according to the standard protocol. Multiplanar CT image reconstructions were also generated. COMPARISON:  None.  No radiographs available. FINDINGS: Mildly displaced fracture of the right sacrum, lateral to the sacral foramen. Comminuted minimally displaced fractures of the LEFT inferior and superior pubic rami, only partially included in the field of view. No extension of the pubic symphysis. Right pubic rami appear intact. Bony under mineralization. Right femoral head is well seated in the acetabulum, no definite right hip fracture. 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  IMPRESSION: 1. Minimally displaced right sacral fracture lateral to the sacral foramen. 2. Comminuted mildly displaced fractures of the LEFT inferior and superior pubic rami, partially included in the field of view. 3. Right hip joint appears intact in this patient with osteoporosis. Electronically Signed   By: Rubye Oaks M.D.   On: 09/21/2015 19:53   Dg Chest Port 1 View  09/22/2015  CLINICAL DATA:  Leukocytosis EXAM: PORTABLE CHEST 1 VIEW COMPARISON:  None. FINDINGS: Significant rotation. Allowing for this, lungs appeared clear except for mild right base opacity. Heart size mildly enlarged. Vascular pattern normal. Aortic calcification. IMPRESSION: Mildly increased markings right lower lobe likely exaggerated by patient rotation. Finding is likely related to atelectasis. Developing pneumonia difficult  to exclude. Radiographic follow-up suggested. Electronically Signed   By: Esperanza Heiraymond  Rubner M.D.   On: 09/22/2015 13:04    ASSESSMENT/PLAN:  Right sacral fracture - for rehabilitation; WBAT; continue Robaxin 500 mg 1 tab by mouth every 8 hours when necessary for muscle spasm; oxycodone 5 mg 1 tab by mouth every 4 hours when necessary for pain  Hypercholesterolemia - continue simvastatin 20 mg 1 tab by mouth daily  Mitral valve prolapse - continue Nadolol 80 mg 1 tab by mouth daily; follow-up with Dr. Patty SermonsBrackbill, cardiology, continue aspirin 81 mg by mouth daily; check BMP  Anemia, acute blood loss - hemoglobin 9.5; check CBC  Protein calorie malnutrition, severe - albumin 2.5; continue Procel 2 scoops by mouth twice a day     Goals of care:  Short-term rehabilitation     [facility]anti[facility] HospitalMEDINA-VARGAS,Starlina Lapre, NP [facility] [phone]

## 2015-09-28 ENCOUNTER — Non-Acute Institutional Stay (SKILLED_NURSING_FACILITY): Payer: Medicare Other | Admitting: Internal Medicine

## 2015-09-28 DIAGNOSIS — D72829 Elevated white blood cell count, unspecified: Secondary | ICD-10-CM

## 2015-09-28 DIAGNOSIS — S32501S Unspecified fracture of right pubis, sequela: Secondary | ICD-10-CM

## 2015-09-28 DIAGNOSIS — R2681 Unsteadiness on feet: Secondary | ICD-10-CM

## 2015-09-28 DIAGNOSIS — E785 Hyperlipidemia, unspecified: Secondary | ICD-10-CM

## 2015-09-28 DIAGNOSIS — E46 Unspecified protein-calorie malnutrition: Secondary | ICD-10-CM

## 2015-09-28 DIAGNOSIS — I341 Nonrheumatic mitral (valve) prolapse: Secondary | ICD-10-CM | POA: Diagnosis not present

## 2015-09-28 DIAGNOSIS — D62 Acute posthemorrhagic anemia: Secondary | ICD-10-CM

## 2015-09-28 DIAGNOSIS — K5900 Constipation, unspecified: Secondary | ICD-10-CM | POA: Diagnosis not present

## 2015-09-28 NOTE — Progress Notes (Signed)
Patient ID: Madison Odom, female   DOB: [birth year], 81 y.o.   MRN: [id number]      [facility]   PCP: Ronny Flurry, MD  Code Status: full code  Allergies  Allergen Reactions  . Penicillins     Yeast infections  . Phenothiazines   . Tetracyclines & Related     Chief Complaint  Patient presents with  . New Admit To SNF     HPI:  81 y.o. patient is here for short term rehabilitation post hospital admission from 09/21/15-09/23/15 post fall with pelvic fracture post fall. She was noted to have minimally displaced right sacral fracture. She was conservatively managed.   She has PMH of HLD, MVP and is seen in her room today. She is alert and oriented and denies any concerns this visit. Her pain is under control with current pain regimen. No falls in the facility. Her foley catheter was removed yesterday and she is voiding well.   Review of Systems:  Constitutional: Negative for fever, chills HENT: Negative for headache, congestion, nasal discharge Eyes: Negative for eye pain, blurred vision, double vision and discharge.  Respiratory: Negative for cough, shortness of breath and wheezing.   Cardiovascular: Negative for chest pain, palpitations, leg swelling.  Gastrointestinal: Negative for heartburn, nausea, vomiting, abdominal pain Genitourinary: Negative for dysuria, flank pain.  Skin: Negative for itching, rash.  Neurological: Negative for dizziness, tingling, focal weakness Psychiatric/Behavioral: Negative for depression.    Past Medical History  Diagnosis Date  . MVP (mitral valve prolapse)   . Hyperlipidemia   . OP (osteoporosis)   . Spastic colon   . Pelvis fracture (HCC) 09/2015    CLOSED    Past Surgical History  Procedure Laterality Date  . Appendectomy    . Bilateral oophorectomy  1981  . Rotator cuff repair Right   . Eye surgery     Social History:   reports that she has never smoked. She has never used smokeless  tobacco. She reports that she does not drink alcohol or use illicit drugs.  Family History  Problem Relation Age of Onset  . Heart attack Mother   . Heart disease Father   . Hypertension Mother   . Hypertension Father   . Stroke Neg Hx     Medications:   Medication List       This list is accurate as of: 09/28/15  5:09 PM.  Always use your most recent med list.               aspirin 81 MG EC tablet  Take 81 mg by mouth daily.     CALCIUM 500 +D PO  Take 1 tablet by mouth daily.     docusate sodium 100 MG capsule  Commonly known as:  COLACE  Take 1 capsule (100 mg total) by mouth 2 (two) times daily.     methocarbamol 500 MG tablet  Commonly known as:  ROBAXIN  Take 1 tablet (500 mg total) by mouth every 8 (eight) hours as needed for muscle spasms.     multivitamin tablet  Take 1 tablet by mouth daily.     nadolol 80 MG tablet  Commonly known as:  CORGARD  TAKE 1 TABLET BY MOUTH EVERY DAY     Senna 15 MG Tabs  Take 1 tablet (15 mg total) by mouth every morning.     simvastatin 20 MG tablet  Commonly known as:  ZOCOR  TAKE 1 TABLET DAILY  Physical Exam: Filed Vitals:   09/28/15 1706  BP: 109/71  Pulse: 61  Temp: 97.9 F (36.6 C)  Resp: 18  Weight: 84 lb 6.4 oz (38.284 kg)  SpO2: 96%    General- elderly female, thin built, in no acute distress Head- normocephalic, atraumatic Nose- normal nasal mucosa, no maxillary or frontal sinus tenderness, no nasal discharge Throat- moist mucus membrane Eyes- PERRLA, EOMI, no pallor, no icterus, no discharge, normal conjunctiva, normal sclera Neck- no cervical lymphadenopathy Cardiovascular- normal s1,s2, no murmurs, no leg edema Respiratory- bilateral clear to auscultation, no wheeze, no rhonchi, no crackles, no use of accessory muscles Abdomen- bowel sounds present, soft, non tender Musculoskeletal- able to move all 4 extremities, right leg ROM limited, on wheelchair, unsteady gait Neurological- no  focal deficit, alert and oriented to person, place and time Skin- warm and dry Psychiatry- normal mood and affect    Labs reviewed: Basic Metabolic Panel:  Recent Labs  40/98/11 0834 05/25/15 0759 09/21/15 2150 09/22/15 0031 09/22/15 0511  NA 136 139 137  --  138  K 4.0 3.9 3.8  --  3.7  CL 104 103 102  --  105  CO2 26 30  --   --  26  GLUCOSE 94 88 110*  --  99  BUN 27* 23 27*  --  20  CREATININE 0.6 0.60 0.40* 0.47 0.43*  CALCIUM 9.2 9.6  --   --  8.3*  MG  --   --   --  1.8  --    Liver Function Tests:  Recent Labs  11/19/14 0834 05/25/15 0759 09/22/15 0511  AST ALT ALKPHOS 83 60 108  BILITOT 0.7 0.5 0.4  PROT 6.9 6.9 5.3*  ALBUMIN 4.0 4.0 2.5*    CBC:  Recent Labs  09/21/15 2140 09/21/15 2150 09/22/15 0031 09/22/15 0511  WBC 14.1*  --  12.5* 10.6*  HGB 10.6* 12.2 9.8* 9.5*  HCT 34.2* 36.0 31.9* 31.3*  MCV 79.5  --  79.9 79.6  PLT 301  --  293 281   09/27/15 wbc 13, hb 10.2, hct 33.6, mcv 81.6, plt 349, na 136, k 4.6, bun 18, cr 0.42   Radiological Exams: Ct Hip Right Wo Contrast  09/21/2015  CLINICAL DATA:  Right hip pain after fall this weekend. EXAM: CT OF THE RIGHT HIP WITHOUT CONTRAST TECHNIQUE: Multidetector CT imaging of the right hip was performed according to the standard protocol. Multiplanar CT image reconstructions were also generated. COMPARISON:  None.  No radiographs available. FINDINGS: Mildly displaced fracture of the right sacrum, lateral to the sacral foramen. Comminuted minimally displaced fractures of the LEFT inferior and superior pubic rami, only partially included in the field of view. No extension of the pubic symphysis. Right pubic rami appear intact. Bony under mineralization. Right femoral head is well seated in the acetabulum, no definite right hip fracture. 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 IMPRESSION: 1. Minimally displaced right sacral fracture lateral to  the sacral foramen. 2. Comminuted mildly displaced fractures of the LEFT inferior and superior pubic rami, partially included in the field of view. 3. Right hip joint appears intact in this patient with osteoporosis. Electronically Signed   By: Rubye Oaks M.D.   On: 09/21/2015 19:53   Dg Chest Port 1 View  09/22/2015  CLINICAL DATA:  Leukocytosis EXAM: PORTABLE CHEST 1 VIEW COMPARISON:  None. FINDINGS: Significant rotation. Allowing for this, lungs appeared  clear except for mild right base opacity. Heart size mildly enlarged. Vascular pattern normal. Aortic calcification. IMPRESSION: Mildly increased markings right lower lobe likely exaggerated by patient rotation. Finding is likely related to atelectasis. Developing pneumonia difficult to exclude. Radiographic follow-up suggested. Electronically Signed   By: Esperanza Heiraymond  Rubner M.D.   On: 09/22/2015 13:04    Assessment/Plan   Right sacral fracture continue Robaxin 500 mg q8h prn muscle spasm and oxyIR 5 mg q4h prn pain for now. Will have patient work with PT/OT as tolerated to regain strength and restore function.  Fall precautions are in place. WBAT for now. Continue ca-vit d  Unsteady gait Post fall and fracture. Will have her work with physical therapy and occupational therapy team to help with gait training and muscle strengthening exercises.fall precautions. Skin care. Encourage to be out of bed. WBAT  Blood loss Anemia Monitor h&h, slightly improved  Protein calorie malnutrition Encourage po intake, monitor weight, continue procel supplement  Leukocytosis No signs of infection, likely reactive leukocytosis, monitor cbc with diff 10/04/15  Hypercholesterolemia continue simvastatin 20 mg daily  Mitral valve prolapse continue Nadolol 80 mg daily and baby aspirin  Constipation Stable, continue senna daily   Goals of care: short term rehabilitation   Labs/tests ordered: cbc with diff 10/04/15  Family/ staff Communication:  reviewed care plan with patient and nursing supervisor. Filled out insurance form for a flight that has to be cancelled given her health condition     Oneal GroutMAHIMA Krystalyn Kubota, MD  [facility] [phone] (Monday-Friday 8 am - 5 pm) [phone] (afterhours)

## 2015-10-05 NOTE — Progress Notes (Signed)
Late entry for missed G-code. Based on review of the evaluation and goals by Samul Dadauth Stout, PT.   09/22/15 1516  PT G-Codes **NOT FOR INPATIENT CLASS**  Functional Assessment Tool Used clinical judgement based on review of medical record  Functional Limitation Mobility: Walking and moving around  Mobility: Walking and Moving Around Current Status ([id number](G8978) CK  Mobility: Walking and Moving Around Goal Status [phone](G8979) CI  Lavona MoundMark Dede Dobesh, [place]PT  [phone] 10/05/2015

## 2015-10-14 ENCOUNTER — Non-Acute Institutional Stay (SKILLED_NURSING_FACILITY): Payer: Medicare Other | Admitting: Adult Health

## 2015-10-14 ENCOUNTER — Encounter: Payer: Self-pay | Admitting: Adult Health

## 2015-10-14 ENCOUNTER — Telehealth: Payer: Self-pay | Admitting: Cardiology

## 2015-10-14 DIAGNOSIS — S32501S Unspecified fracture of right pubis, sequela: Secondary | ICD-10-CM | POA: Diagnosis not present

## 2015-10-14 DIAGNOSIS — D62 Acute posthemorrhagic anemia: Secondary | ICD-10-CM

## 2015-10-14 DIAGNOSIS — E7800 Pure hypercholesterolemia, unspecified: Secondary | ICD-10-CM | POA: Diagnosis not present

## 2015-10-14 DIAGNOSIS — I341 Nonrheumatic mitral (valve) prolapse: Secondary | ICD-10-CM | POA: Diagnosis not present

## 2015-10-14 DIAGNOSIS — E43 Unspecified severe protein-calorie malnutrition: Secondary | ICD-10-CM

## 2015-10-14 DIAGNOSIS — D72829 Elevated white blood cell count, unspecified: Secondary | ICD-10-CM

## 2015-10-14 NOTE — Telephone Encounter (Signed)
Agree with plan, I will sign for PT and OT

## 2015-10-14 NOTE — Telephone Encounter (Signed)
Spoke with [name] and advised  Dr. Patty SermonsBrackbill would sign orders since he is her PCP Patient will be d/c tomorrow. Will call her next week and advise she needs to find a PCP since  Dr. Patty SermonsBrackbill is retiring

## 2015-10-14 NOTE — Telephone Encounter (Signed)
New Message    Office calling stating that they are wanting to know if Dr. Patty SermonsBrackbill with sign home health orders for PT and OT for the pt. Please call back and advise.

## 2015-10-14 NOTE — Progress Notes (Signed)
Patient ID: Madison Odom, female   DOB: 12-19-1933, 81 y.o.   MRN: [id number]    DATE:  10/14/2015   MRN:  [id number]  BIRTHDAY: 22-Nov-1933  Facility:  Nursing Home Location:  [facility]  Nursing Home Room Number: 808-P  LEVEL OF CARE:  SNF (31)  Contact Information    Name Relation Home Work Mobile   Drexel Daughter [phone]     [name],[name] Daughter   [phone]       Chief Complaint  Patient presents with  . Discharge Note    Closed displaced fracture of right pubis, hypercholesterolemia, mitral valve prolapse, anemia and protein calorie malnutrition    HISTORY OF PRESENT ILLNESS:  This is a 81 year old female who is for discharge home with home health PT for endurance and OT for ADLs. DME:  3-in-1 bedside commode. She has been admitted to [facility] on 09/22/17 from [facility]. She has PMH of mitral valve prolapse and dyslipidemia. She had a fall sustaining minimally displaced right sacral fracture.   Patient was admitted to this facility for short-term rehabilitation after the patient's recent hospitalization.  Patient has completed SNF rehabilitation and therapy has cleared the patient for discharge.   PAST MEDICAL HISTORY:  Past Medical History  Diagnosis Date  . MVP (mitral valve prolapse)   . Hyperlipidemia   . OP (osteoporosis)   . Spastic colon   . Pelvis fracture (HCC) 09/2015    CLOSED      CURRENT MEDICATIONS: Reviewed  Patient's Medications  New Prescriptions   No medications on file  Previous Medications   ASPIRIN 81 MG EC TABLET    Take 81 mg by mouth daily.     CALCIUM CARB-CHOLECALCIFEROL (CALCIUM 500 +D PO)    Take 1 tablet by mouth daily.    DOCUSATE SODIUM (COLACE) 100 MG CAPSULE    Take 1 capsule (100 mg total) by mouth 2 (two) times daily.   METHOCARBAMOL (ROBAXIN) 500 MG TABLET    Take 500 mg by mouth every 6 (six) hours as needed for muscle spasms.   MULTIPLE VITAMIN (MULTIVITAMIN) TABLET    Take 1  tablet by mouth daily.   NADOLOL (CORGARD) 80 MG TABLET    TAKE 1 TABLET BY MOUTH EVERY DAY   PROTEIN (PROCEL PO)    Take 2 scoop by mouth 2 (two) times daily.   SENNOSIDES (SENNA) 15 MG TABS    Take 1 tablet (15 mg total) by mouth every morning.   SIMVASTATIN (ZOCOR) 20 MG TABLET    TAKE 1 TABLET DAILY  Modified Medications   No medications on file  Discontinued Medications   METHOCARBAMOL (ROBAXIN) 500 MG TABLET    Take 1 tablet (500 mg total) by mouth every 8 (eight) hours as needed for muscle spasms.     Allergies  Allergen Reactions  . Penicillins     Yeast infections  . Phenothiazines   . Tetracyclines & Related      REVIEW OF SYSTEMS:  GENERAL: no change in appetite, no fatigue, no weight changes, no fever, chills or weakness EYES: Denies change in vision, dry eyes, eye pain, itching or discharge EARS: Denies change in hearing, ringing in ears, or earache NOSE: Denies nasal congestion or epistaxis MOUTH and THROAT: Denies oral discomfort, gingival pain or bleeding, pain from teeth or hoarseness   RESPIRATORY: no cough, SOB, DOE, wheezing, hemoptysis CARDIAC: no chest pain, edema or palpitations GI: no abdominal pain, diarrhea, constipation, heart burn, nausea or  vomiting GU: Denies dysuria, frequency, hematuria, incontinence, or discharge PSYCHIATRIC: Denies feeling of depression or anxiety. No report of hallucinations, insomnia, paranoia, or agitation   PHYSICAL EXAMINATION  GENERAL APPEARANCE: Well nourished. In no acute distress. Normal body habitus HEAD: Normal in size and contour. No evidence of trauma EYES: Lids open and close normally. No blepharitis, entropion or ectropion. PERRL. Conjunctivae are clear and sclerae are white. Lenses are without opacity EARS: Pinnae are normal. Patient hears normal voice tunes of the examiner MOUTH and THROAT: Lips are without lesions. Oral mucosa is moist and without lesions. Tongue is normal in shape, size, and color and  without lesions NECK: supple, trachea midline, no neck masses, no thyroid tenderness, no thyromegaly LYMPHATICS: no LAN in the neck, no supraclavicular LAN RESPIRATORY: breathing is even & unlabored, BS CTAB, +scoliosis CARDIAC: RRR, no murmur,no extra heart sounds, no edema GI: abdomen soft, normal BS, no masses, no tenderness, no hepatomegaly, no splenomegaly EXTREMITIES:  Able to move 4 extremities PSYCHIATRIC: Alert and oriented X 3. Affect and behavior are appropriate  LABS/RADIOLOGY: Labs reviewed: 10/04/15  WBC 12.9 hemoglobin 10.2 hematocrit 33.9 MCV 82.7 platelet 463 09/27/15  WBC 13.0 hemoglobin 10.2 hematocrit 33.6 MCV 81.6 platelet 349 sodium 136 potassium 4.6 glucose 81 BUN 18 creatinine 0.42 calcium 8.8 Basic Metabolic Panel:  Recent Labs  16/10/96 0834 05/25/15 0759 09/21/15 2150 09/22/15 0031 09/22/15 0511  NA 136 139 137  --  138  K 4.0 3.9 3.8  --  3.7  CL 104 103 102  --  105  CO2 26 30  --   --  26  GLUCOSE 94 88 110*  --  99  BUN 27* 23 27*  --  20  CREATININE 0.6 0.60 0.40* 0.47 0.43*  CALCIUM 9.2 9.6  --   --  8.3*  MG  --   --   --  1.8  --    Liver Function Tests:  Recent Labs  11/19/14 0834 05/25/15 0759 09/22/15 0511  AST ALT ALKPHOS 83 60 108  BILITOT 0.7 0.5 0.4  PROT 6.9 6.9 5.3*  ALBUMIN 4.0 4.0 2.5*   CBC:  Recent Labs  09/21/15 2140 09/21/15 2150 09/22/15 0031 09/22/15 0511  WBC 14.1*  --  12.5* 10.6*  HGB 10.6* 12.2 9.8* 9.5*  HCT 34.2* 36.0 31.9* 31.3*  MCV 79.5  --  79.9 79.6  PLT 301  --  293 281   Lipid Panel:  Recent Labs  11/19/14 0834 05/25/15 0759  HDL 68.30 72.00   Cardiac Enzymes:  Recent Labs  09/22/15 0031 09/22/15 0511 09/22/15 1123  TROPONINI 0.06* 0.08* 0.07*     Ct Hip Right Wo Contrast  09/21/2015  CLINICAL DATA:  Right hip pain after fall this weekend. EXAM: CT OF THE RIGHT HIP WITHOUT CONTRAST TECHNIQUE: Multidetector CT imaging of the right hip was performed  according to the standard protocol. Multiplanar CT image reconstructions were also generated. COMPARISON:  None.  No radiographs available. FINDINGS: Mildly displaced fracture of the right sacrum, lateral to the sacral foramen. Comminuted minimally displaced fractures of the LEFT inferior and superior pubic rami, only partially included in the field of view. No extension of the pubic symphysis. Right pubic rami appear intact. Bony under mineralization. Right femoral head is well seated in the acetabulum, no definite right hip fracture. 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 IMPRESSION: 1. Minimally displaced right sacral fracture lateral to  the sacral foramen. 2. Comminuted mildly displaced fractures of the LEFT inferior and superior pubic rami, partially included in the field of view. 3. Right hip joint appears intact in this patient with osteoporosis. Electronically Signed   By: Rubye OaksMelanie  Ehinger M.D.   On: 09/21/2015 19:53   Dg Chest Port 1 View  09/22/2015  CLINICAL DATA:  Leukocytosis EXAM: PORTABLE CHEST 1 VIEW COMPARISON:  None. FINDINGS: Significant rotation. Allowing for this, lungs appeared clear except for mild right base opacity. Heart size mildly enlarged. Vascular pattern normal. Aortic calcification. IMPRESSION: Mildly increased markings right lower lobe likely exaggerated by patient rotation. Finding is likely related to atelectasis. Developing pneumonia difficult to exclude. Radiographic follow-up suggested. Electronically Signed   By: Esperanza Heiraymond  Rubner M.D.   On: 09/22/2015 13:04    ASSESSMENT/PLAN:  Closed displaced fracture of right pubis, sequela  - for home health PT and OT; WBAT; continue Robaxin 500 mg 1 tab by mouth every 6 hours when necessary for muscle spasm; oxycodone 5 mg 1 tab by mouth every 4 hours when necessary for pain  Hypercholesterolemia - continue simvastatin 20 mg 1 tab by mouth daily  Mitral valve prolapse - continue Nadolol  80 mg 1 tab by mouth daily; follow-up with Dr. Patty SermonsBrackbill, cardiology, continue aspirin 81 mg by mouth daily  Anemia, acute blood loss - re-check hemoglobin 10.2; improved from 9.5  Protein calorie malnutrition, severe - albumin 2.5; continue Procel 2 scoops by mouth twice a day  Leukocytosis - recheck wbc 12.9 ; improved from 13.0; no signs of infection; check CBC in 1 week; follow-up with PCP    I have filled out patient's discharge paperwork and written prescriptions.  Patient will receive home health PT and OT.  DME provided: 3-in-1 bedside commode  Total discharge time: Greater than 30 minutes  Discharge time involved coordination of the discharge process with social worker, nursing staff and therapy department. Medical justification for home health services/DME verified.    [facility],Lamara Brecht, NP [facility] [phone]

## 2015-10-19 ENCOUNTER — Telehealth: Payer: Self-pay | Admitting: Cardiology

## 2015-10-19 NOTE — Telephone Encounter (Signed)
1) Patient would like Rx for Stair lift - discuss with  Dr. Patty SermonsBrackbill Monday when he is back in office  2) Does patient need follow up before January for insurance to cover OT and PT. Will call  [facility] [phone]     And follow up on appointments

## 2015-10-19 NOTE — Telephone Encounter (Signed)
Advised patient needs to get PCP, verbalized understanding

## 2015-10-19 NOTE — Telephone Encounter (Signed)
New Message   Pt calling to have rn return the call

## 2015-10-20 NOTE — Telephone Encounter (Signed)
New Message  Liji with [facility] Verbal orders for PT.  To work on Insurance account managergate and balance. Please call back to discuss

## 2015-10-20 NOTE — Telephone Encounter (Signed)
Verbal order given to Liji with [facility]. She will also confirm with home health agency no visit needed before January 18 ov She will call back if patient needs to be seen prior to that

## 2015-10-25 ENCOUNTER — Ambulatory Visit (INDEPENDENT_AMBULATORY_CARE_PROVIDER_SITE_OTHER): Payer: Medicare Other | Admitting: Physician Assistant

## 2015-10-25 ENCOUNTER — Encounter: Payer: Self-pay | Admitting: Physician Assistant

## 2015-10-25 VITALS — BP 140/78 | HR 74 | Ht 62.0 in | Wt 83.0 lb

## 2015-10-25 DIAGNOSIS — I341 Nonrheumatic mitral (valve) prolapse: Secondary | ICD-10-CM | POA: Diagnosis not present

## 2015-10-25 DIAGNOSIS — E7800 Pure hypercholesterolemia, unspecified: Secondary | ICD-10-CM | POA: Diagnosis not present

## 2015-10-25 NOTE — Patient Instructions (Signed)
Medication Instructions:  Your physician recommends that you continue on your current medications as directed. Please refer to the Current Medication list given to you today.    Labwork: NONE ORDERED  Testing/Procedures: NONE ORDERED  Follow-Up: Your physician recommends that you keep your scheduled follow-up appointment in January with Dr. Patty SermonsBrackbill   Any Other Special Instructions Will Be Listed Below (If Applicable).   If you need a refill on your cardiac medications before your next appointment, please call your pharmacy.

## 2015-10-25 NOTE — Assessment & Plan Note (Signed)
Stable without cardiac complaints. Follow-up with Dr. Patty SermonsBrackbill next month.

## 2015-10-25 NOTE — Assessment & Plan Note (Signed)
Have written prescriptions stating that the patient can perform physical therapy at home from a cardiac standpoint and also a prescription for home stair climber because she cannot walk stairs at this time.

## 2015-10-25 NOTE — Progress Notes (Signed)
Cardiology Office Note   Date:  10/25/2015   ID:  BEXLEY LAUBACH, DOB 1934/05/14, MRN [id number]  PCP:  No PCP Per Patient  Cardiologist: Dr. Patty Sermons  Chief Complaint: Clearance for PT    History of Present Illness: Madison Odom is a 81 y.o. female who presents for follow up and clearance for physical therapy in her home. She sees Dr. Patty Sermons for mitral valve prolapse and hyperlipidemia. She has terrible osteoarthritis and had a fall causing 2 fractures in her right hip that did not require surgery. She went to a rehabilitation facility but is now home. She needs a note stating she can perform physical therapy at home as well as a prescription for home stair climber. She has no primary M.D.  Patient has had no cardiac complaints. She denies chest pain, palpitations, dyspnea, dyspnea on exertion, dizziness or presyncope. She has mild edema of her left lower leg since all this happened. The fracture was on the right. It goes down when she keeps it elevated. 2-D echo in the hospital showed normal LV function EF 60-65% grade 1 diastolic dysfunction.    Past Medical History  Diagnosis Date  . MVP (mitral valve prolapse)   . Hyperlipidemia   . OP (osteoporosis)   . Spastic colon   . Pelvis fracture (HCC) 09/2015    CLOSED     Past Surgical History  Procedure Laterality Date  . Appendectomy    . Bilateral oophorectomy  1981  . Rotator cuff repair Right   . Eye surgery       Current Outpatient Prescriptions  Medication Sig Dispense Refill  . aspirin 81 MG EC tablet Take 81 mg by mouth daily.      . Calcium Carb-Cholecalciferol (CALCIUM 500 +D PO) Take 1 tablet by mouth daily.     Marland Kitchen FLUZONE HIGH-DOSE 0.5 ML SUSY TO BE ADMINISTERED BY PHARMACIST FOR IMMUNIZATION  0  . methocarbamol (ROBAXIN) 500 MG tablet Take 500 mg by mouth every 6 (six) hours as needed for muscle spasms.    . Multiple Vitamin (MULTIVITAMIN) tablet Take 1 tablet by mouth daily.    . nadolol (CORGARD) 80 MG  tablet TAKE 1 TABLET BY MOUTH EVERY DAY 90 tablet 1  . Protein (PROCEL PO) Take 2 scoop by mouth 2 (two) times daily.    . simvastatin (ZOCOR) 20 MG tablet TAKE 1 TABLET DAILY 90 tablet 1   No current facility-administered medications for this visit.    Allergies:   Penicillins; Phenothiazines; and Tetracyclines & related    Social History:  The patient  reports that she has never smoked. She has never used smokeless tobacco. She reports that she does not drink alcohol or use illicit drugs.   Family History:  The patient's    family history includes Heart attack in her mother; Heart disease in her father; Hypertension in her father and mother. There is no history of Stroke.    ROS:  Please see the history of present illness.   Otherwise, review of systems are positive for walks with a walker.   All other systems are reviewed and negative.    PHYSICAL EXAM: VS:  BP 140/78 mmHg  Pulse 74  Ht  (1.575 m)  Wt 83 lb (37.649 kg)  BMI 15.18 kg/m2  SpO2 95% , BMI Body mass index is 15.18 kg/(m^2). GEN: Well nourished, well developed, in no acute distress Neck: no JVD, HJR, carotid bruits, or masses Cardiac: RRR; mitral valve prolapse and 1/6  systolic murmur apex, no gallop, rubs, thrill or heave,  Respiratory:  clear to auscultation bilaterally, normal work of breathing GI: soft, nontender, nondistended, + BS MS: no deformity or atrophy Extremities: Mild left ankle edema without cyanosis, clubbing, good distal pulses bilaterally.  Skin: warm and dry, no rash Neuro:  Strength and sensation are intact    EKG:  EKG is not ordered today.    Recent Labs: 09/22/2015: ALT 23; BUN 20; Creatinine, Ser 0.43*; Hemoglobin 9.5*; Magnesium 1.8; Platelets 281; Potassium 3.7; Sodium 138; TSH 0.734    Lipid Panel    Component Value Date/Time   CHOL 166 05/25/2015 0759   TRIG 72.0 05/25/2015 0759   HDL 72.00 05/25/2015 0759   CHOLHDL 2 05/25/2015 0759   VLDL 14.4 05/25/2015 0759    LDLCALC 80 05/25/2015 0759      Wt Readings from Last 3 Encounters:  10/25/15 83 lb (37.649 kg)  10/14/15 84 lb 6.4 oz (38.284 kg)  09/28/15 84 lb 6.4 oz (38.284 kg)      Other studies Reviewed: Additional studies/ records that were reviewed today include and review of the records demonstrates: Echo 09/2015 Study Conclusions  - Left ventricle: The cavity size was normal. Wall thickness was   increased in a pattern of mild LVH. Systolic function was normal.   The estimated ejection fraction was in the range of 60% to 65%.   Wall motion was normal; there were no regional wall motion   abnormalities. Doppler parameters are consistent with abnormal   left ventricular relaxation (grade 1 diastolic dysfunction). - Aortic valve: There was trivial regurgitation. - Mitral valve: Prolapse cannot be excluded. - Tricuspid valve: There was moderate regurgitation. - Pulmonary arteries: PA peak pressure: 62 mm Hg (S).   ASSESSMENT AND PLAN:  Mitral valve prolapse Stable without cardiac complaints. Follow-up with Dr. Patty SermonsBrackbill next month.  Hypercholesterolemia [facility]  Closed fracture of pelvis [facility](HCC) Have written prescriptions stating that the patient can perform physical therapy at home from a cardiac standpoint and also a prescription for home stair climber because she cannot walk stairs at this time.    Elson ClanSigned, Daleyza Gadomski, PA-C  10/25/2015 2:33 PM    [facility] [address], [place], [place]  [address] Phone: [phone]; Fax: [phone]

## 2015-10-25 NOTE — Assessment & Plan Note (Signed)
[facility]

## 2015-12-01 ENCOUNTER — Ambulatory Visit (INDEPENDENT_AMBULATORY_CARE_PROVIDER_SITE_OTHER): Payer: Medicare Other | Admitting: Cardiology

## 2015-12-01 ENCOUNTER — Encounter: Payer: Self-pay | Admitting: Cardiology

## 2015-12-01 VITALS — BP 160/70 | HR 64 | Ht <= 58 in | Wt 82.8 lb

## 2015-12-01 DIAGNOSIS — D508 Other iron deficiency anemias: Secondary | ICD-10-CM | POA: Diagnosis not present

## 2015-12-01 DIAGNOSIS — E7800 Pure hypercholesterolemia, unspecified: Secondary | ICD-10-CM

## 2015-12-01 DIAGNOSIS — M419 Scoliosis, unspecified: Secondary | ICD-10-CM

## 2015-12-01 DIAGNOSIS — I341 Nonrheumatic mitral (valve) prolapse: Secondary | ICD-10-CM

## 2015-12-01 LAB — CBC WITH DIFFERENTIAL/PLATELET
Basophils Absolute: 0 10*3/uL (ref 0.0–0.1)
Basophils Relative: 0 % (ref 0–1)
EOS ABS: 0.1 10*3/uL (ref 0.0–0.7)
EOS PCT: 1 % (ref 0–5)
HEMATOCRIT: 31.7 % — AB (ref 36.0–46.0)
Hemoglobin: 9.5 g/dL — ABNORMAL LOW (ref 12.0–15.0)
LYMPHS ABS: 2 10*3/uL (ref 0.7–4.0)
LYMPHS PCT: 20 % (ref 12–46)
MCH: 23.2 pg — AB (ref 26.0–34.0)
MCHC: 30 g/dL (ref 30.0–36.0)
MCV: 77.3 fL — AB (ref 78.0–100.0)
MONOS PCT: 10 % (ref 3–12)
MPV: 9.4 fL (ref 8.6–12.4)
Monocytes Absolute: 1 10*3/uL (ref 0.1–1.0)
Neutro Abs: 6.8 10*3/uL (ref 1.7–7.7)
Neutrophils Relative %: 69 % (ref 43–77)
PLATELETS: 448 10*3/uL — AB (ref 150–400)
RBC: 4.1 MIL/uL (ref 3.87–5.11)
RDW: 16.2 % — AB (ref 11.5–15.5)
WBC: 9.8 10*3/uL (ref 4.0–10.5)

## 2015-12-01 LAB — BASIC METABOLIC PANEL
BUN: 24 mg/dL (ref 7–25)
CHLORIDE: 102 mmol/L (ref 98–110)
CO2: 26 mmol/L (ref 20–31)
CREATININE: 0.58 mg/dL — AB (ref 0.60–0.93)
Calcium: 9.5 mg/dL (ref 8.6–10.4)
Glucose, Bld: 103 mg/dL — ABNORMAL HIGH (ref 65–99)
Potassium: 4.1 mmol/L (ref 3.5–5.3)
Sodium: 136 mmol/L (ref 135–146)

## 2015-12-01 LAB — HEPATIC FUNCTION PANEL
ALK PHOS: 100 U/L (ref 33–130)
ALT: 14 U/L (ref 6–29)
AST: 21 U/L (ref 10–35)
Albumin: 3.7 g/dL (ref 3.6–5.1)
BILIRUBIN DIRECT: 0.1 mg/dL (ref <=0.2)
BILIRUBIN INDIRECT: 0.3 mg/dL (ref 0.2–1.2)
BILIRUBIN TOTAL: 0.4 mg/dL (ref 0.2–1.2)
Total Protein: 6.6 g/dL (ref 6.1–8.1)

## 2015-12-01 LAB — LIPID PANEL
Cholesterol: 153 mg/dL (ref 125–200)
HDL: 73 mg/dL (ref >=46)
LDL CALC: 60 mg/dL (ref <130)
Total CHOL/HDL Ratio: 2.1 Ratio (ref <=5.0)
Triglycerides: 101 mg/dL (ref <150)
VLDL: 20 mg/dL (ref <30)

## 2015-12-01 NOTE — Progress Notes (Signed)
Quick Note:  Please report to patient. The recent labs are stable. Continue same medication and careful diet. She is still anemic following her pelvic fracture. Start ferrous sulfate 325 mg once daily to help build up Hgb. Recheck CBC 1 month. ______

## 2015-12-01 NOTE — Progress Notes (Signed)
Cardiology Office Note   Date:  12/01/2015   ID:  Madison Odom, DOB [birth year], MRN [id number]  PCP:  No PCP Per Patient  Cardiologist: Cassell Clement MD  Chief Complaint  Patient presents with  . Follow-up    Patient denies chest pain, shortness of breath, or le edema      History of Present Illness: Madison Odom is a 82 y.o. female who presents for a six-month follow-up visit.   She has a history of mitral valve prolapse and history of hypercholesterolemia. She has a history of scoliosis and osteoporosis.She has terrible osteoarthritis and had a fall on 09/21/15 causing closed fracture of pelvis that did not require surgery. She went to a rehabilitation facility but is now home.  She had physical therapy at home.  She is doing well.  She is walking with a rolling walker.  She is not having any cardiac symptoms from her mitral valve prolapse.  She does complain of fatigue which may be from her anemia which was noted in the hospital.  It was felt to be acute blood loss anemia at that time.  We are rechecking a CBC today.  Past Medical History  Diagnosis Date  . MVP (mitral valve prolapse)   . Hyperlipidemia   . OP (osteoporosis)   . Spastic colon   . Pelvis fracture (HCC) 09/2015    CLOSED     Past Surgical History  Procedure Laterality Date  . Appendectomy    . Bilateral oophorectomy  1981  . Rotator cuff repair Right   . Eye surgery       Current Outpatient Prescriptions  Medication Sig Dispense Refill  . aspirin 81 MG EC tablet Take 81 mg by mouth daily.      . Calcium Carb-Cholecalciferol (CALCIUM 500 +D PO) Take 1 tablet by mouth daily.     . methocarbamol (ROBAXIN) 500 MG tablet Take 500 mg by mouth every 6 (six) hours as needed for muscle spasms.    . Multiple Vitamin (MULTIVITAMIN) tablet Take 1 tablet by mouth daily.    . nadolol (CORGARD) 80 MG tablet TAKE 1 TABLET BY MOUTH EVERY DAY 90 tablet 1  . Protein (PROCEL PO) Take 2 scoop by mouth 2 (two)  times daily.    . simvastatin (ZOCOR) 20 MG tablet Take 20 mg by mouth daily.     No current facility-administered medications for this visit.    Allergies:   Penicillins; Phenothiazines; and Tetracyclines & related    Social History:  The patient  reports that she has never smoked. She has never used smokeless tobacco. She reports that she does not drink alcohol or use illicit drugs.   Family History:  The patient's family history includes Heart attack in her mother; Heart disease in her father; Hypertension in her father and mother. There is no history of Stroke.    ROS:  Please see the history of present illness.   Otherwise, review of systems are positive for none.   All other systems are reviewed and negative.    PHYSICAL EXAM: VS:  BP 160/70 mmHg  Pulse 64  Ht  (1.473 m)  Wt 82 lb 12.8 oz (37.558 kg)  BMI 17.31 kg/m2 , BMI Body mass index is 17.31 kg/(m^2). GEN: Well nourished, well developed, in no acute distress HEENT: normal Neck: no JVD, carotid bruits, or masses Cardiac: RRR; no murmurs, rubs, or gallops,no edema .  There is a short midsystolic click Respiratory:  clear  to auscultation bilaterally, normal work of breathing GI: soft, nontender, nondistended, + BS MS: no deformity or atrophy.  There is scoliosis Skin: warm and dry, no rash Neuro:  Strength and sensation are intact Psych: euthymic mood, full affect   EKG:  EKG is ordered today. The ekg ordered today demonstrates normal sinus rhythm.  Within normal limits.   Recent Labs: 09/22/2015: ALT 23; BUN 20; Creatinine, Ser 0.43*; Hemoglobin 9.5*; Magnesium 1.8; Platelets 281; Potassium 3.7; Sodium 138; TSH 0.734    Lipid Panel    Component Value Date/Time   CHOL 166 05/25/2015 0759   TRIG 72.0 05/25/2015 0759   HDL 72.00 05/25/2015 0759   CHOLHDL 2 05/25/2015 0759   VLDL 14.4 05/25/2015 0759   LDLCALC 80 05/25/2015 0759      Wt Readings from Last 3 Encounters:  12/01/15 82 lb 12.8 oz (37.558  kg)  10/25/15 83 lb (37.649 kg)  10/14/15 84 lb 6.4 oz (38.284 kg)        ASSESSMENT AND PLAN:  1. Mitral valve prolapse 2. Hypercholesterolemia 3. Kyphoscoliosis 4. Closed fracture of pelvis from mechanical fall 09/21/15, conservative treatment. 5.  History of blood loss anemia   Current medicines are reviewed at length with the patient today.  The patient does not have concerns regarding medicines.  The following changes have been made:  no change  Labs/ tests ordered today include:  No orders of the defined types were placed in this encounter.    Disposition: Continue current medication.  Await today's lab work.  She will be getting a primary care provider.  She will call the office at [facility]. Return in 6 months for cardiology follow-up with Dr. Delton See regarding her mitral valve prolapse   Signed, Cassell Clement MD 12/01/2015 8:22 AM    [facility] [address] [place], [place], [place]  [address] Phone: [phone]; Fax: [phone]

## 2015-12-01 NOTE — Patient Instructions (Signed)
Medication Instructions:  Your physician recommends that you continue on your current medications as directed. Please refer to the Current Medication list given to you today.  Labwork: Lp/bmet/hfp/cbc  Testing/Procedures: none  Follow-Up: Your physician wants you to follow-up in: 6 month ov with Dr Johnell Comings will receive a reminder letter in the mail two months in advance. If you don't receive a letter, please call our office to schedule the follow-up appointment.  If you need a refill on your cardiac medications before your next appointment, please call your pharmacy.

## 2015-12-03 ENCOUNTER — Telehealth: Payer: Self-pay | Admitting: *Deleted

## 2015-12-03 DIAGNOSIS — D649 Anemia, unspecified: Secondary | ICD-10-CM

## 2015-12-03 MED ORDER — FERROUS SULFATE 325 (65 FE) MG PO TABS: 325.0000 mg | ORAL_TABLET | Freq: Every day | ORAL | Status: DC

## 2015-12-03 NOTE — Telephone Encounter (Signed)
Advised patient of lab results and scheduled follow up labs 

## 2015-12-03 NOTE — Telephone Encounter (Signed)
-----   Message from Cassell Clement, MD sent at 12/01/2015  8:41 PM EST ----- Please report to patient.  The recent labs are stable. Continue same medication and careful diet. She is still anemic following her pelvic fracture. Start ferrous sulfate 325 mg once daily to help build up Hgb.  Recheck CBC 1 month.

## 2015-12-10 ENCOUNTER — Telehealth: Payer: Self-pay

## 2015-12-10 NOTE — Telephone Encounter (Signed)
HH Certificate of Addendum faxed with updated start date [facility] Attn: Joni Reining [phone] fax [phone] phone

## 2015-12-14 ENCOUNTER — Other Ambulatory Visit: Payer: Self-pay | Admitting: Cardiology

## 2015-12-28 ENCOUNTER — Other Ambulatory Visit: Payer: Self-pay | Admitting: Cardiology

## 2016-01-04 ENCOUNTER — Other Ambulatory Visit (INDEPENDENT_AMBULATORY_CARE_PROVIDER_SITE_OTHER): Payer: Medicare Other | Admitting: *Deleted

## 2016-01-04 DIAGNOSIS — D649 Anemia, unspecified: Secondary | ICD-10-CM

## 2016-01-04 LAB — CBC WITH DIFFERENTIAL/PLATELET
BASOS PCT: 0 % (ref 0–1)
Basophils Absolute: 0 10*3/uL (ref 0.0–0.1)
EOS ABS: 0.1 10*3/uL (ref 0.0–0.7)
Eosinophils Relative: 1 % (ref 0–5)
HCT: 38.5 % (ref 36.0–46.0)
Hemoglobin: 11.9 g/dL — ABNORMAL LOW (ref 12.0–15.0)
Lymphocytes Relative: 18 % (ref 12–46)
Lymphs Abs: 1.7 10*3/uL (ref 0.7–4.0)
MCH: 25.8 pg — AB (ref 26.0–34.0)
MCHC: 30.9 g/dL (ref 30.0–36.0)
MCV: 83.3 fL (ref 78.0–100.0)
MONO ABS: 1.3 10*3/uL — AB (ref 0.1–1.0)
MONOS PCT: 14 % — AB (ref 3–12)
MPV: 10 fL (ref 8.6–12.4)
NEUTROS PCT: 67 % (ref 43–77)
Neutro Abs: 6.2 10*3/uL (ref 1.7–7.7)
PLATELETS: 318 10*3/uL (ref 150–400)
RBC: 4.62 MIL/uL (ref 3.87–5.11)
RDW: 22.7 % — ABNORMAL HIGH (ref 11.5–15.5)
WBC: 9.2 10*3/uL (ref 4.0–10.5)

## 2016-01-06 ENCOUNTER — Telehealth: Payer: Self-pay | Admitting: *Deleted

## 2016-01-06 NOTE — Telephone Encounter (Signed)
Advised patient   Notes Recorded by Cassell Clement, MD on 01/05/2016 at 6:14 PM Since the ferrous sulfate is making her feel bad, we can stop it now. She should try to eat plenty of iron rich food Notes Recorded by Burnell Blanks on 01/05/2016 at 2:09 PM Advised patient of lab results  Patient is c/o the Ferrous Sulfate making her feel really bad, makes her nauseated and shaky at times Patient is taking with food but does not seem to help.  Will forward to Dr. Patty Sermons for review Notes Recorded by Cassell Clement, MD on 01/05/2016 at 8:02 AM Please report. The hemoglobin has improved significantly since January. Continue current medication.

## 2016-04-20 ENCOUNTER — Encounter: Payer: Self-pay | Admitting: Adult Health

## 2016-04-20 ENCOUNTER — Ambulatory Visit (INDEPENDENT_AMBULATORY_CARE_PROVIDER_SITE_OTHER): Payer: Medicare Other | Admitting: Adult Health

## 2016-04-20 VITALS — BP 126/80 | Temp 97.6°F | Wt 85.0 lb

## 2016-04-20 DIAGNOSIS — R29818 Other symptoms and signs involving the nervous system: Secondary | ICD-10-CM | POA: Diagnosis not present

## 2016-04-20 DIAGNOSIS — D509 Iron deficiency anemia, unspecified: Secondary | ICD-10-CM

## 2016-04-20 DIAGNOSIS — Z7189 Other specified counseling: Secondary | ICD-10-CM | POA: Diagnosis not present

## 2016-04-20 DIAGNOSIS — Z7689 Persons encountering health services in other specified circumstances: Secondary | ICD-10-CM

## 2016-04-20 DIAGNOSIS — R2689 Other abnormalities of gait and mobility: Secondary | ICD-10-CM

## 2016-04-20 LAB — CBC WITH DIFFERENTIAL/PLATELET
BASOS PCT: 0.7 % (ref 0.0–3.0)
Basophils Absolute: 0.1 10*3/uL (ref 0.0–0.1)
EOS PCT: 0.9 % (ref 0.0–5.0)
Eosinophils Absolute: 0.1 10*3/uL (ref 0.0–0.7)
HCT: 36.4 % (ref 36.0–46.0)
HEMOGLOBIN: 11.6 g/dL — AB (ref 12.0–15.0)
Lymphocytes Relative: 20.5 % (ref 12.0–46.0)
Lymphs Abs: 2.3 10*3/uL (ref 0.7–4.0)
MCHC: 31.8 g/dL (ref 30.0–36.0)
MCV: 79.1 fl (ref 78.0–100.0)
MONO ABS: 1.1 10*3/uL — AB (ref 0.1–1.0)
MONOS PCT: 10 % (ref 3.0–12.0)
Neutro Abs: 7.8 10*3/uL — ABNORMAL HIGH (ref 1.4–7.7)
Neutrophils Relative %: 67.9 % (ref 43.0–77.0)
Platelets: 381 10*3/uL (ref 150.0–400.0)
RBC: 4.6 Mil/uL (ref 3.87–5.11)
RDW: 15.7 % — AB (ref 11.5–15.5)
WBC: 11.4 10*3/uL — AB (ref 4.0–10.5)

## 2016-04-20 NOTE — Patient Instructions (Addendum)
It was great meeting you today!  I will follow up with you regarding your blood work.   Someone will call you to schedule your physical therapy   Make an appointment with me in January for your next physical. If you need anything in the meantime, please let me know.

## 2016-04-20 NOTE — Progress Notes (Signed)
Patient presents to clinic today to establish care.She is a pleasant, independent and active 82 year old female who  has a past medical history of MVP (mitral valve prolapse); Hyperlipidemia; OP (osteoporosis); Spastic colon; Pelvis fracture (HCC) (09/2015); GERD (gastroesophageal reflux disease); and Scoliosis.   She has last had a physical in January 2017 with Dr. Patty Sermons.   Acute Concerns: Establish Care  Chronic Issues: Anemia - She was recently taking iron supplements but was unable to continue them due to nausea. She would like to make sure she is no longer anemic.   Mitral Valve Prolapse - Is followed by Cardiology for this issue  Balance Issues - She had a pelvic fracture in 2016 from a fall. She denies having a fall recently but feels off balance and is having to walk with a single prong cain when outside of her home.  She would like to see physical therapy for her balance issues.    Health Maintenance: Dental --Twice a year Vision -- Yearly  Immunizations -- Does not want pneumonia  Colonoscopy - Does not need Mammogram -- No longer needed PAP -- No longer needed Bone Density -- Unsure   Followed by: Cardiology - twice a year for MVP.     Past Medical History  Diagnosis Date  . MVP (mitral valve prolapse)   . Hyperlipidemia   . OP (osteoporosis)   . Spastic colon   . Pelvis fracture (HCC) 09/2015    CLOSED     Past Surgical History  Procedure Laterality Date  . Appendectomy    . Bilateral oophorectomy  1981  . Rotator cuff repair Right   . Eye surgery      Current Outpatient Prescriptions on File Prior to Visit  Medication Sig Dispense Refill  . aspirin 81 MG EC tablet Take 81 mg by mouth daily.      . Calcium Carb-Cholecalciferol (CALCIUM 500 +D PO) Take 1 tablet by mouth daily.     . Multiple Vitamin (MULTIVITAMIN) tablet Take 1 tablet by mouth daily.    . nadolol (CORGARD) 80 MG tablet TAKE 1 TABLET BY MOUTH EVERY DAY 90 tablet 1  . Protein  (PROCEL PO) Take 2 scoop by mouth 2 (two) times daily.    . simvastatin (ZOCOR) 20 MG tablet Take 20 mg by mouth daily.     No current facility-administered medications on file prior to visit.    Allergies  Allergen Reactions  . Penicillins     Yeast infections  . Phenothiazines   . Tetracyclines & Related     Family History  Problem Relation Age of Onset  . Heart attack Mother   . Heart disease Father   . Hypertension Mother   . Hypertension Father   . Stroke Neg Hx     Social History   Social History  . Marital Status: Married    Spouse Name: N/A  . Number of Children: N/A  . Years of Education: N/A   Occupational History  . Not on file.   Social History Main Topics  . Smoking status: Never Smoker   . Smokeless tobacco: Never Used  . Alcohol Use: No  . Drug Use: No  . Sexual Activity: Not on file   Other Topics Concern  . Not on file   Social History Narrative    Review of Systems  Constitutional: Negative.   HENT: Negative.   Eyes: Negative.   Respiratory: Negative.   Cardiovascular: Negative.   Gastrointestinal: Negative.  Genitourinary: Negative.   Musculoskeletal: Negative.   Skin: Negative.   Neurological: Negative.   Endo/Heme/Allergies: Negative.   Psychiatric/Behavioral: Negative.     BP 126/80 mmHg  Temp(Src) 97.6 F (36.4 C) (Oral)  Wt 85 lb (38.556 kg)  Physical Exam  Constitutional: She is oriented to person, place, and time and well-developed, well-nourished, and in no distress. No distress.  HENT:  Head: Normocephalic.  Right Ear: External ear normal.  Left Ear: External ear normal.  Nose: Nose normal.  Mouth/Throat: Oropharynx is clear and moist. No oropharyngeal exudate.  Eyes: Conjunctivae and EOM are normal. Pupils are equal, round, and reactive to light. Right eye exhibits no discharge. Left eye exhibits no discharge. No scleral icterus.  Neck: Normal range of motion. Neck supple.  Cardiovascular: Normal rate, regular  rhythm, normal heart sounds and intact distal pulses.  Exam reveals no gallop.   No murmur heard. Pulmonary/Chest: Effort normal and breath sounds normal. No respiratory distress. She has no wheezes. She has no rales. She exhibits no tenderness.  Abdominal: Soft. Bowel sounds are normal. She exhibits no distension and no mass. There is no tenderness. There is no rebound.  Musculoskeletal: Normal range of motion.  Scoliosis  Walks with a single prong cane   Lymphadenopathy:    She has no cervical adenopathy.  Neurological: She is alert and oriented to person, place, and time. She has normal reflexes. She displays normal reflexes. No cranial nerve deficit. She exhibits normal muscle tone. Gait normal. Coordination normal. GCS score is 15.  Skin: Skin is warm and dry. No rash noted. She is not diaphoretic. No erythema.  Psychiatric: Mood, memory, affect and judgment normal.  Nursing note and vitals reviewed.    Assessment/Plan: 1. Encounter to establish care - Follow up in January for CPE  - Follow up sooner if needed - Continue to eat healthy   2. Anemia, iron deficiency - CBC with Differential/Platelet  3. Balance disorder - Ambulatory referral to Physical Therapy - Continue to use cane with ambulation   Shirline Freesory Gean Laursen, NP

## 2016-04-24 ENCOUNTER — Encounter: Payer: Self-pay | Admitting: Physical Therapy

## 2016-04-24 ENCOUNTER — Ambulatory Visit: Payer: Medicare Other | Attending: Adult Health | Admitting: Physical Therapy

## 2016-04-24 DIAGNOSIS — R2689 Other abnormalities of gait and mobility: Secondary | ICD-10-CM

## 2016-04-24 DIAGNOSIS — M6281 Muscle weakness (generalized): Secondary | ICD-10-CM | POA: Insufficient documentation

## 2016-04-24 NOTE — Patient Instructions (Signed)
Fall Prevention and Home Safety Falls cause injuries and can affect all age groups. It is possible to use preventive measures to significantly decrease the likelihood of falls. There are many simple measures which can make your home safer and prevent falls. OUTDOORS  Repair cracks and edges of walkways and driveways.  Remove high doorway thresholds.  Trim shrubbery on the main path into your home.  Have good outside lighting.  Clear walkways of tools, rocks, debris, and clutter.  Check that handrails are not broken and are securely fastened. Both sides of steps should have handrails.  Have leaves, snow, and ice cleared regularly.  Use sand or salt on walkways during winter months.  In the garage, clean up grease or oil spills. BATHROOM  Install night lights.  Install grab bars by the toilet and in the tub and shower.  Use non-skid mats or decals in the tub or shower.  Place a plastic non-slip stool in the shower to sit on, if needed.  Keep floors dry and clean up all water on the floor immediately.  Remove soap buildup in the tub or shower on a regular basis.  Secure bath mats with non-slip, double-sided rug tape.  Remove throw rugs and tripping hazards from the floors. BEDROOMS  Install night lights.  Make sure a bedside light is easy to reach.  Do not use oversized bedding.  Keep a telephone by your bedside.  Have a firm chair with side arms to use for getting dressed.  Remove throw rugs and tripping hazards from the floor. KITCHEN  Keep handles on pots and pans turned toward the center of the stove. Use back burners when possible.  Clean up spills quickly and allow time for drying.  Avoid walking on wet floors.  Avoid hot utensils and knives.  Position shelves so they are not too high or low.  Place commonly used objects within easy reach.  If necessary, use a sturdy step stool with a grab bar when reaching.  Keep electrical cables out of the  way.  Do not use floor polish or wax that makes floors slippery. If you must use wax, use non-skid floor wax.  Remove throw rugs and tripping hazards from the floor. STAIRWAYS  Never leave objects on stairs.  Place handrails on both sides of stairways and use them. Fix any loose handrails. Make sure handrails on both sides of the stairways are as long as the stairs.  Check carpeting to make sure it is firmly attached along stairs. Make repairs to worn or loose carpet promptly.  Avoid placing throw rugs at the top or bottom of stairways, or properly secure the rug with carpet tape to prevent slippage. Get rid of throw rugs, if possible.  Have an electrician put in a light switch at the top and bottom of the stairs. OTHER FALL PREVENTION TIPS  Wear low-heel or rubber-soled shoes that are supportive and fit well. Wear closed toe shoes.  When using a stepladder, make sure it is fully opened and both spreaders are firmly locked. Do not climb a closed stepladder.  Add color or contrast paint or tape to grab bars and handrails in your home. Place contrasting color strips on first and last steps.  Learn and use mobility aids as needed. Install an electrical emergency response system.  Turn on lights to avoid dark areas. Replace light bulbs that burn out immediately. Get light switches that glow.  Arrange furniture to create clear pathways. Keep furniture in the same place.    Firmly attach carpet with non-skid or double-sided tape.  Eliminate uneven floor surfaces.  Select a carpet pattern that does not visually hide the edge of steps.  Be aware of all pets. OTHER HOME SAFETY TIPS  Set the water temperature for 120 F (48.8 C).  Keep emergency numbers on or near the telephone.  Keep smoke detectors on every level of the home and near sleeping areas. Document Released: 10/20/2002 Document Revised: 04/30/2012 Document Reviewed: 01/19/2012 [facility] Patient Information 2015  [employer] [place]. This information is not intended to replace advice given to you by your health care provider. Make sure you discuss any questions you have with your health care provider.   ABDUCTION: Standing (Active)    Stand, feet flat. Lift right leg out to side. Use _0__ lbs. Complete _2__ sets of _10__ repetitions. Perform _1__ sessions per day.  http://gtsc.exer.us/110   Copyright  VHI. All rights reserved.   EXTENSION: Standing (Active)    Stand, both feet flat. Draw right leg behind body as far as possible. Use _0__ lbs. Complete _2__ sets of _10__ repetitions. Perform _1__ sessions per day.  http://gtsc.exer.us/76   Copyright  VHI. All rights reserved.  [facility] [address], [address] [place], [place] [address] Phone # [phone] Fax [phone]

## 2016-04-24 NOTE — Therapy (Signed)
[facility] [address], [address] [place], [place], [address] Phone: [phone]   Fax:  [phone]  Physical Therapy Evaluation  Patient Details  Name: Madison Odom MRN: [id number] Date of Birth: 1934-07-01 Referring Provider: Dr. [name]  Encounter Date: 04/24/2016      PT End of Session - 04/24/16 1143    Visit Number 1   Number of Visits 10   Date for PT Re-Evaluation 06/19/16   Authorization Type medicare; g-soce on 10 visit   PT Start Time 1100   PT Stop Time 1140   PT Time Calculation (min) 40 min   Activity Tolerance Patient tolerated treatment well   Behavior During Therapy [facility] for tasks assessed/performed      Past Medical History  Diagnosis Date  . MVP (mitral valve prolapse)   . Hyperlipidemia   . OP (osteoporosis)   . Spastic colon   . Pelvis fracture (HCC) 09/2015    CLOSED   . GERD (gastroesophageal reflux disease)   . Scoliosis     Past Surgical History  Procedure Laterality Date  . Appendectomy    . Bilateral oophorectomy  1981  . Rotator cuff repair Right   . Eye surgery    . Abdominal hysterectomy  1965    There were no vitals filed for this visit.       Subjective Assessment - 04/24/16 1103    Subjective Patient reports she feels like she is going to fall when walking on different surfaces. Patient feels fine when in her home. No falls since 11/2015.  She did fall on 09/2015 when she broke her pelvis in 2 places.  When patient gets out of the chair she needs to get steady.  Patient does not use cane when in the house.    Patient Stated Goals walk and not feel like she is going to fall.   Currently in Pain? No/denies            [facility] PT Assessment - 04/24/16 0001    Assessment   Medical Diagnosis R29.818 Balance disorder   Referring Provider Dr. [name]   Onset Date/Surgical Date 11/14/15   Prior Therapy Yes, after last fall in 09/2015   Precautions   Precautions Other (comment)   Precaution Comments osteoporosis   Restrictions   Weight Bearing Restrictions No   Balance Screen   Has the patient fallen in the past 6 months No   Has the patient had a decrease in activity level because of a fear of falling?  Yes   Is the patient reluctant to leave their home because of a fear of falling?  No   Home Environment   Living Environment Private residence   Living Arrangements Alone   Type of Home --  townhouse   Home Access Stairs to enter   Entrance Stairs-Number of Steps 1   Entrance Stairs-Rails None   Home Layout Two level   Alternate Level Stairs-Number of Steps --  has a chair lift to go up steps   [employer] - single point;Shower seat - built in;Transport chair   Prior Function   Level of Independence Independent   Vocation Retired   Copy Status Within Functional Limits for tasks assessed   Posture/Postural Control   Posture/Postural Control Postural limitations   Postural Limitations Rounded Shoulders;Forward head;Increased thoracic kyphosis;Flexed trunk   Posture Comments hieght with  SECA system is 4 feet 8 inches   ROM /  Strength   AROM / PROM / Strength Strength   Strength   Right Hip Flexion 4/5   Right Hip Extension 3+/5   Right Hip ABduction 3+/5   Left Hip Flexion 4/5   Left Hip Extension 3+/5   Left Hip ABduction 3+/5   Right Knee Flexion 4-/5   Right Knee Extension 4/5   Left Knee Flexion 4-/5   Left Knee Extension 4/5   Right Ankle Plantar Flexion 3/5   Right Ankle Inversion 4/5   Right Ankle Eversion 4/5   Left Ankle Plantar Flexion 3/5   Left Ankle Inversion 4/5   Left Ankle Eversion 4/5   Ambulation/Gait   Ambulation/Gait Yes   Ambulation/Gait Assistance 6: Modified independent (Device/Increase time)   Assistive device Straight cane   Gait Pattern Decreased trunk rotation;Trunk flexed;Wide base of support   Standardized Balance Assessment   Standardized Balance  Assessment Berg Balance Test   Berg Balance Test   Sit to Stand Able to stand  independently using hands   Standing Unsupported Able to stand safely 2 minutes   Sitting with Back Unsupported but Feet Supported on Floor or Stool Able to sit safely and securely 2 minutes   Stand to Sit Sits safely with minimal use of hands   Transfers Able to transfer safely, definite need of hands   Standing Unsupported with Eyes Closed Able to stand 10 seconds safely   Standing Ubsupported with Feet Together Able to place feet together independently and stand 1 minute safely   From Standing, Reach Forward with Outstretched Arm Can reach forward >12 cm safely (5")   From Standing Position, Pick up Object from Floor Able to pick up shoe, needs supervision   From Standing Position, Turn to Look Behind Over each Shoulder Turn sideways only but maintains balance   Turn 360 Degrees Able to turn 360 degrees safely but slowly   Standing Unsupported, Alternately Place Feet on Step/Stool Able to stand independently and complete 8 steps >20 seconds   Standing Unsupported, One Foot in Front Able to take small step independently and hold 30 seconds   Standing on One Leg Unable to try or needs assist to prevent fall   Total Score 41   Berg comment: 80% chance of falling                           PT Education - 04/24/16 1143    Education provided Yes   Education Details tips to avoid falls, hip abduction and extension in standing   Person(s) Educated Patient   Methods Explanation;Demonstration;Verbal cues;Handout   Comprehension Returned demonstration;Verbalized understanding          PT Short Term Goals - 04/24/16 1504    PT SHORT TERM GOAL #1   Title indepedent with initial HEP   Time 4   Period Weeks   Status New   PT SHORT TERM GOAL #2   Title berg balance score >/= 45/56    Time 4   Period Weeks   Status New   PT SHORT TERM GOAL #3   Title improved confidence with walking outside  increased >/= 25%   Time 4   Period Weeks   Status New           PT Long Term Goals - 04/24/16 1130    PT LONG TERM GOAL #1   Title independent with HEP   Time 8   Period Weeks   Status New  PT LONG TERM GOAL #2   Title Berg balance score >/= 50/56   Time 8   Period Weeks   Status New   PT LONG TERM GOAL #3   Title improved confidence with walking outside >/= 75% due to increased lower extremity strength   Time 8   Period Weeks   Status New   PT LONG TERM GOAL #4   Title improved trunk strength  so she is able to stand >/= 4 feet 9 inches   Time 8   Period Weeks   Status New               Plan - 05-06-2016 1506    Clinical Impression Statement Patient is a 82 year old female with diagnosis of balance disorder for the past 6 months. Patient broke her pelvis last November and had physical therapy.  Patient reports she has been siting alot so her balance is off again.  Patient is scared to walk outside due to fear of falling.  Patient does not use her cane indoors but will use it outdoors.  Berg balance score is 41/56 indicating 80% chance of falling. Patient has weakness in bilateral lower extremites. Posture consists of kyphoscoliosis, flexed posture so her rib cage is sitting on her pelvis, forwared head and rounded shoulders.  Patient is able to striaghten her posture for 1 min.  Height with SECA system is 4 feet 8 inches, patient reports she used to be 5 foot 3 inches.  Patient is of moderate complexity due to having osteoporosis, pelvic flracture, and poor posture.   Patient has balance  deficits, weakness in trunk and legs, and poor posture.  Patient will benefit form physical therapy to improve trunk and lower extremity strength  to improve her balance.    Rehab Potential Good   Clinical Impairments Affecting Rehab Potential None   PT Frequency 2x / week   PT Duration 8 weeks   PT Treatment/Interventions Gait training;Functional mobility training;Therapeutic  activities;Therapeutic exercise;Balance training;Patient/family education;Neuromuscular re-education;Manual techniques;Electrical Stimulation;Moist Heat;Energy conservation;Passive range of motion   PT Next Visit Plan decompression for the spine, trunk strength, bil. LE strength, flexibility exericises   PT Home Exercise Plan stretches   Recommended Other Services None   Consulted and Agree with Plan of Care Patient      Patient will benefit from skilled therapeutic intervention in order to improve the following deficits and impairments:  Abnormal gait, Increased fascial restricitons, Decreased mobility, Postural dysfunction, Decreased activity tolerance, Decreased endurance, Decreased range of motion, Decreased strength, Decreased balance, Difficulty walking  Visit Diagnosis: Muscle weakness (generalized) - Plan: PT plan of care cert/re-cert  Other abnormalities of gait and mobility - Plan: PT plan of care cert/re-cert      G-Codes - 06-May-2016 1141    Functional Assessment Tool Used Berg balance score is 41/56  goal is 49/56   Functional Limitation Mobility: Walking and moving around   Mobility: Walking and Moving Around Current Status [phone]) At least 20 percent but less than 40 percent impaired, limited or restricted   Mobility: Walking and Moving Around Goal Status [phone]) At least 1 percent but less than 20 percent impaired, limited or restricted       Problem List Patient Active Problem List   Diagnosis Date Noted  . Acute blood loss anemia 09/27/2015  . Pelvic fracture (HCC) 09/21/2015  . Closed fracture of pelvis (HCC)   . Kyphoscoliosis 09/16/2013  . Mitral valve prolapse 05/24/2011  . Hypercholesterolemia 05/24/2011  Eulis FosterCheryl Lizette Pazos, PT 04/24/2016 3:24 PM    [facility] [address], [address] [place], [place], [address] Phone: [phone]   Fax:  [phone]  Name: Kristopher Attwood Flashatsy A Laurie MRN: [id number] Date of  Birth: [birth year]

## 2016-04-26 ENCOUNTER — Ambulatory Visit: Payer: Medicare Other | Admitting: Physical Therapy

## 2016-04-26 ENCOUNTER — Encounter: Payer: Self-pay | Admitting: Physical Therapy

## 2016-04-26 DIAGNOSIS — M6281 Muscle weakness (generalized): Secondary | ICD-10-CM

## 2016-04-26 DIAGNOSIS — R2689 Other abnormalities of gait and mobility: Secondary | ICD-10-CM

## 2016-04-26 NOTE — Patient Instructions (Signed)
Heel Raises    Stand with support. Tighten pelvic floor and hold. With knees straight, raise heels off ground. Hold __1-2_ seconds. Relax for _1-2__ seconds. Repeat 10___ times. Do _2__ times a day. If your legs are having a bad day: do this exercise seated.    #2: Lift the Heart exercise: Seated with back support: Inhale, exhale and lift your heart up, gazing to where the wall & ceiling meet. You should feel like you are getting longer, creating space between your ribs and pelvis.  Do 5x, try this throughout the day.   Copyright  VHI. All rights reserved.

## 2016-04-26 NOTE — Therapy (Signed)
[facility] [address] [place], [place], [address] Phone: [phone]   Fax:  [phone]  Physical Therapy Treatment  Patient Details  Name: Madison Odom MRN: [id number] Date of Birth: [birth year] Referring Provider: Dr. [name]  Encounter Date: 04/26/2016      PT End of Session - 04/26/16 1436    Visit Number 2   Number of Visits 10   Date for PT Re-Evaluation 06/19/16   Authorization Type medicare; g-soce on 10 visit   PT Start Time 1431   PT Stop Time 1516   PT Time Calculation (min) 45 min   Activity Tolerance Patient tolerated treatment well   Behavior During Therapy [facility] for tasks assessed/performed      Past Medical History  Diagnosis Date  . MVP (mitral valve prolapse)   . Hyperlipidemia   . OP (osteoporosis)   . Spastic colon   . Pelvis fracture (HCC) 09/2015    CLOSED   . GERD (gastroesophageal reflux disease)   . Scoliosis     Past Surgical History  Procedure Laterality Date  . Appendectomy    . Bilateral oophorectomy  1981  . Rotator cuff repair Right   . Eye surgery    . Abdominal hysterectomy  1965    There were no vitals filed for this visit.      Subjective Assessment - 04/26/16 1436    Subjective No pain, "i'm afraid to walk outside."   Currently in Pain? No/denies   Multiple Pain Sites No                         [facility] Adult PT Treatment/Exercise - 04/26/16 0001    Ambulation/Gait   Assistive device Straight cane   Ambulation Surface Level   Gait Comments Worked on increasing her BOS when she walked, tendancy was to scissor RTLE > LTLE   High Level Balance   High Level Balance Activities --  Standing on Air x 2 min: small weight shiting 10x CGA   Knee/Hip Exercises: Aerobic   Nustep L1 x 6 min   Knee/Hip Exercises: Standing   Heel Raises Both;1 set;10 reps   Hip Abduction Stengthening;Both;1 set;10 reps   Hip Extension Stengthening;1 set;10  reps   Rebounder 3 way weight shift 1 min each way   Knee/Hip Exercises: Seated   Sit to Sand --  Seated ball work to bottoms of feet to increase NM communica   Shoulder Exercises: Stretch   Other Shoulder Stretches Seated thoracic extension stretch 5x   "lift the heart ex"                PT Education - 04/26/16 1449    Education provided Yes   Education Details HEP progessions:  standing heel raises, seated thoracic ext stretches   Person(s) Educated Patient   Methods Explanation   Comprehension Verbalized understanding;Returned demonstration          PT Short Term Goals - 04/26/16 1437    PT SHORT TERM GOAL #1   Title indepedent with initial HEP   Time 4   Period Weeks   Status Achieved           PT Long Term Goals - 04/24/16 1130    PT LONG TERM GOAL #1   Title independent with HEP   Time 8   Period Weeks   Status New   PT LONG TERM GOAL #2   Title Berg balance  score >/= 50/56   Time 8   Period Weeks   Status New   PT LONG TERM GOAL #3   Title improved confidence with walking outside >/= 75% due to increased lower extremity strength   Time 8   Period Weeks   Status New   PT LONG TERM GOAL #4   Title improved trunk strength  so she is able to stand >/= 4 feet 9 inches   Time 8   Period Weeks   Status New               Plan - 04/26/16 1438    Clinical Impression Statement Pt did excellent throughout todays session. She was able to stand more erect and lessen her kyphosis significanlty. Still difficult holding it, but apprears to be improving. Added to HEP today to include standing heel raises and thoracic  extension stretching .  During ambulation pt  tends to walk with narrow BOS and scissor on the RT > LT. No pain with exercises.    Rehab Potential Good   Clinical Impairments Affecting Rehab Potential None   PT Frequency 2x / week   PT Duration 8 weeks   PT Treatment/Interventions Gait training;Functional mobility training;Therapeutic  activities;Therapeutic exercise;Balance training;Patient/family education;Neuromuscular re-education;Manual techniques;Electrical Stimulation;Moist Heat;Energy conservation;Passive range of motion   PT Next Visit Plan Nustep, hip strength, trunk strength, review new HEP.   Consulted and Agree with Plan of Care Patient      Patient will benefit from skilled therapeutic intervention in order to improve the following deficits and impairments:  Abnormal gait, Increased fascial restricitons, Decreased mobility, Postural dysfunction, Decreased activity tolerance, Decreased endurance, Decreased range of motion, Decreased strength, Decreased balance, Difficulty walking  Visit Diagnosis: Muscle weakness (generalized)  Other abnormalities of gait and mobility     Problem List Patient Active Problem List   Diagnosis Date Noted  . Acute blood loss anemia 09/27/2015  . Pelvic fracture (HCC) 09/21/2015  . Closed fracture of pelvis (HCC)   . Kyphoscoliosis 09/16/2013  . Mitral valve prolapse 05/24/2011  . Hypercholesterolemia 05/24/2011    Madison Odom, PTA 04/26/2016, 3:23 PM  [facility] [address], [address] [place], [place], [address] Phone: [phone]   Fax:  [phone]  Name: Madison Odom MRN: [id number] Date of Birth: [birth year]

## 2016-05-02 ENCOUNTER — Ambulatory Visit: Payer: Medicare Other | Admitting: Physical Therapy

## 2016-05-02 ENCOUNTER — Encounter: Payer: Self-pay | Admitting: Physical Therapy

## 2016-05-02 DIAGNOSIS — R2689 Other abnormalities of gait and mobility: Secondary | ICD-10-CM

## 2016-05-02 DIAGNOSIS — M6281 Muscle weakness (generalized): Secondary | ICD-10-CM | POA: Diagnosis not present

## 2016-05-02 NOTE — Therapy (Signed)
[facility] [address], [address] [place], [place], [address] Phone: [phone]   Fax:  [phone]  Physical Therapy Treatment  Patient Details  Name: Madison Odom MRN: [id number] Date of Birth: 1934-06-20 Referring Provider: Dr. [name]  Encounter Date: 05/02/2016      PT End of Session - 05/02/16 1114    Visit Number 3   Number of Visits 10   Date for PT Re-Evaluation 06/19/16   Authorization Type medicare; g-soce on 10 visit   PT Start Time 1058   PT Stop Time 1143   PT Time Calculation (min) 45 min   Activity Tolerance Patient tolerated treatment well   Behavior During Therapy [facility] for tasks assessed/performed      Past Medical History  Diagnosis Date  . MVP (mitral valve prolapse)   . Hyperlipidemia   . OP (osteoporosis)   . Spastic colon   . Pelvis fracture (HCC) 09/2015    CLOSED   . GERD (gastroesophageal reflux disease)   . Scoliosis     Past Surgical History  Procedure Laterality Date  . Appendectomy    . Bilateral oophorectomy  1981  . Rotator cuff repair Right   . Eye surgery    . Abdominal hysterectomy  1965    There were no vitals filed for this visit.      Subjective Assessment - 05/02/16 1111    Subjective No c/o of pain, pt is using single point cane for outside ambulation, still feels unsteady    Patient Stated Goals walk and not feel like she is going to fall.   Currently in Pain? No/denies                         [facility] Adult PT Treatment/Exercise - 05/02/16 0001    Ambulation/Gait   Ambulation/Gait Yes   Ambulation/Gait Assistance 6: Modified independent (Device/Increase time)   Ambulation Distance (Feet) 60 Feet  x 6   Assistive device Straight cane   Gait Pattern Decreased trunk rotation;Trunk flexed;Wide base of support   Ambulation Surface Level;Indoor   Gait Comments Worked on increasing her BOS when she walked, tendancy was to scissor RTLE > LTLE    High Level Balance   High Level Balance Activities Side stepping  standing x on Airex weightshift Lt/Rt & forward/backw    Knee/Hip Exercises: Aerobic   Nustep L1 x 8 min   Knee/Hip Exercises: Standing   Hip Abduction Stengthening;Both;1 set;10 reps   Hip Extension Stengthening;1 set;10 reps   Rebounder 3 way weight shift 1 min each way  using one UE only   Other Standing Knee Exercises leaning agains red ball with cane into available UE flexion 2 x 5   Shoulder Exercises: Seated   Other Seated Exercises Cane into bil UE flexion 2 x 5 with deep inhalation for trunk extension   Shoulder Exercises: Stretch   Other Shoulder Stretches Seated thoracic extension stretch 5x   "lift the heart ex"                  PT Short Term Goals - 05/02/16 1121    PT SHORT TERM GOAL #1   Title indepedent with initial HEP   Time 4   Period Weeks   Status Achieved   PT SHORT TERM GOAL #2   Title berg balance score >/= 45/56    Time 4   Period Weeks   Status On-going   PT  SHORT TERM GOAL #3   Title improved confidence with walking outside increased >/= 25%   Time 4   Period Weeks   Status On-going           PT Long Term Goals - 04/24/16 1130    PT LONG TERM GOAL #1   Title independent with HEP   Time 8   Period Weeks   Status New   PT LONG TERM GOAL #2   Title Berg balance score >/= 50/56   Time 8   Period Weeks   Status New   PT LONG TERM GOAL #3   Title improved confidence with walking outside >/= 75% due to increased lower extremity strength   Time 8   Period Weeks   Status New   PT LONG TERM GOAL #4   Title improved trunk strength  so she is able to stand >/= 4 feet 9 inches   Time 8   Period Weeks   Status New               Plan - 05/02/16 1114    Clinical Impression Statement Pt with improved trunk extension and able to perform trampoline exercises with only 1 UE support for balance. Pt will continue to benefit from skilled PT   Rehab  Potential Good   PT Frequency 2x / week   PT Duration 8 weeks   PT Treatment/Interventions Gait training;Functional mobility training;Therapeutic activities;Therapeutic exercise;Balance training;Patient/family education;Neuromuscular re-education;Manual techniques;Electrical Stimulation;Moist Heat;Energy conservation;Passive range of motion   PT Next Visit Plan Nustep, hip strength, trunk strength, review new HEP.   PT Home Exercise Plan stretches   Recommended Other Services none   Consulted and Agree with Plan of Care Patient      Patient will benefit from skilled therapeutic intervention in order to improve the following deficits and impairments:  Abnormal gait, Increased fascial restricitons, Decreased mobility, Postural dysfunction, Decreased activity tolerance, Decreased endurance, Decreased range of motion, Decreased strength, Decreased balance, Difficulty walking  Visit Diagnosis: Muscle weakness (generalized)  Other abnormalities of gait and mobility     Problem List Patient Active Problem List   Diagnosis Date Noted  . Acute blood loss anemia 09/27/2015  . Pelvic fracture (HCC) 09/21/2015  . Closed fracture of pelvis (HCC)   . Kyphoscoliosis 09/16/2013  . Mitral valve prolapse 05/24/2011  . Hypercholesterolemia 05/24/2011    NAUMANN-HOUEGNIFIO,Madison Odom PTA 05/02/2016, 11:46 AM  [facility] [address], [address] [place], [place], [address] Phone: [phone]   Fax:  [phone]  Name: Madison Odom MRN: [id number] Date of Birth: 09-Dec-1933

## 2016-05-04 ENCOUNTER — Ambulatory Visit: Payer: Medicare Other

## 2016-05-04 DIAGNOSIS — M6281 Muscle weakness (generalized): Secondary | ICD-10-CM | POA: Diagnosis not present

## 2016-05-04 DIAGNOSIS — R2689 Other abnormalities of gait and mobility: Secondary | ICD-10-CM

## 2016-05-04 NOTE — Therapy (Signed)
[facility] [address], [address] [place], [place], [address] Phone: [phone]   Fax:  [phone]  Physical Therapy Treatment  Patient Details  Name: Madison Odom MRN: [id number] Date of Birth: 1934/01/22 Referring Provider: Dr. Shirline Freesory Nafziger  Encounter Date: 05/04/2016      PT End of Session - 05/04/16 1134    Visit Number 4   Number of Visits 10   Date for PT Re-Evaluation 06/19/16   Authorization Type medicare; g-codes on 10 visit   PT Start Time 1055   PT Stop Time 1139   PT Time Calculation (min) 44 min   Activity Tolerance Patient tolerated treatment well   Behavior [facility] Therapy [facility] for tasks assessed/performed      Past Medical History  Diagnosis Date  . MVP (mitral valve prolapse)   . Hyperlipidemia   . OP (osteoporosis)   . Spastic colon   . Pelvis fracture (HCC) 09/2015    CLOSED   . GERD (gastroesophageal reflux disease)   . Scoliosis     Past Surgical History  Procedure Laterality Date  . Appendectomy    . Bilateral oophorectomy  1981  . Rotator cuff repair Right   . Eye surgery    . Abdominal hysterectomy  1965    There were no vitals filed for this visit.      Subjective Assessment - 05/04/16 1109    Subjective Doing OK today.  Mild LBP because of sleeping wrong.     Currently in Pain? No/denies                         [facility] Adult [facility] Treatment/Exercise - 05/04/16 0001    Knee/Hip Exercises: Aerobic   Nustep L1 x 8 min   Knee/Hip Exercises: Standing   Heel Raises Both;10 reps;2 sets   Hip Abduction Stengthening;Both;10 reps;2 sets   Hip Extension Stengthening;10 reps;2 sets   Forward Step Up Both;2 sets;10 reps;Hand Hold: 2;Step Height: 6"   Rocker Board 3 minutes   Rebounder 3 way weight shift 1 min each way  using one UE only   Shoulder Exercises: ROM/Strengthening   UBE (Upper Arm Bike) Level 0 forward x 5 minutes  seated on blue Airex pad, emphasis on  posture                  PT Short Term Goals - 05/02/16 1121    PT SHORT TERM GOAL #1   Title indepedent with initial HEP   Time 4   Period Weeks   Status Achieved   PT SHORT TERM GOAL #2   Title berg balance score >/= 45/56    Time 4   Period Weeks   Status On-going   PT SHORT TERM GOAL #3   Title improved confidence with walking outside increased >/= 25%   Time 4   Period Weeks   Status On-going           PT Long Term Goals - 04/24/16 1130    PT LONG TERM GOAL #1   Title independent with HEP   Time 8   Period Weeks   Status New   PT LONG TERM GOAL #2   Title Berg balance score >/= 50/56   Time 8   Period Weeks   Status New   PT LONG TERM GOAL #3   Title improved confidence with walking outside >/= 75% due to increased lower extremity strength   Time 8  Period Weeks   Status New   PT LONG TERM GOAL #4   Title improved trunk strength  so she is able to stand >/= 4 feet 9 inches   Time 8   Period Weeks   Status New               Plan - 05/04/16 1110    Clinical Impression Statement Pt with continued chronic balance and strength deficits.  Pt tolerated all exercise well today in the clinic.  Pt feels better since starting therapy.  Pt will continue to benefit from skilled PT for strength, balance and endurance to improve safety and independence in the community.     Rehab Potential Good   PT Frequency 2x / week   PT Duration 8 weeks   PT Treatment/Interventions Gait training;Functional mobility training;Therapeutic activities;Therapeutic exercise;Balance training;Patient/family education;Neuromuscular re-education;Manual techniques;Electrical Stimulation;Moist Heat;Energy conservation;Passive range of motion   PT Next Visit Plan Nustep, hip strength, trunk strength   Consulted and Agree with Plan of Care Patient      Patient will benefit from skilled therapeutic intervention in order to improve the following deficits and impairments:   Abnormal gait, Increased fascial restricitons, Decreased mobility, Postural dysfunction, Decreased activity tolerance, Decreased endurance, Decreased range of motion, Decreased strength, Decreased balance, Difficulty walking  Visit Diagnosis: Muscle weakness (generalized)  Other abnormalities of gait and mobility     Problem List Patient Active Problem List   Diagnosis Date Noted  . Acute blood loss anemia 09/27/2015  . Pelvic fracture (HCC) 09/21/2015  . Closed fracture of pelvis (HCC)   . Kyphoscoliosis 09/16/2013  . Mitral valve prolapse 05/24/2011  . Hypercholesterolemia 05/24/2011     Madison Odom, PT 05/04/2016 11:35 AM  [facility] [address], [address] [place], [place], [address] Phone: [phone]   Fax:  [phone]  Name: Madison Odom MRN: [id number] Date of Birth: [birth year]

## 2016-05-08 ENCOUNTER — Ambulatory Visit: Payer: Medicare Other | Admitting: Physical Therapy

## 2016-05-08 ENCOUNTER — Encounter: Payer: Self-pay | Admitting: Physical Therapy

## 2016-05-08 DIAGNOSIS — R2689 Other abnormalities of gait and mobility: Secondary | ICD-10-CM

## 2016-05-08 DIAGNOSIS — M6281 Muscle weakness (generalized): Secondary | ICD-10-CM

## 2016-05-08 NOTE — Therapy (Signed)
[facility] [address], [address] [place], [place], [address] Phone: [phone]   Fax:  [phone]  Physical Therapy Treatment  Patient Details  Name: Madison Odom MRN: [id number] Date of Birth: September 11, 1934 Referring Provider: Dr. Shirline Freesory Nafziger  Encounter Date: 05/08/2016      PT End of Session - 05/08/16 1106    Visit Number 5   Number of Visits 10   Date for PT Re-Evaluation 06/19/16   Authorization Type medicare; g-codes on 10 visit   PT Start Time 1050   PT Stop Time 1131   PT Time Calculation (min) 41 min   Activity Tolerance Patient tolerated treatment well   Behavior [facility] Therapy [facility] for tasks assessed/performed      Past Medical History  Diagnosis Date  . MVP (mitral valve prolapse)   . Hyperlipidemia   . OP (osteoporosis)   . Spastic colon   . Pelvis fracture (HCC) 09/2015    CLOSED   . GERD (gastroesophageal reflux disease)   . Scoliosis     Past Surgical History  Procedure Laterality Date  . Appendectomy    . Bilateral oophorectomy  1981  . Rotator cuff repair Right   . Eye surgery    . Abdominal hysterectomy  1965    There were no vitals filed for this visit.      Subjective Assessment - 05/08/16 1104    Subjective pt reports her shoulders hurt very much after the arm bike last session. She does not want to do that again. She does report her legs feel stronger.    Currently in Pain? No/denies                         [facility] Treatment/Exercise - 05/08/16 0001    Knee/Hip Exercises: Aerobic   Nustep L1 x 10 min   Knee/Hip Exercises: Standing   Heel Raises Both;20 reps   Hip Abduction Stengthening;Both  2 x15   Hip Extension Stengthening;Both  2 x15   Forward Step Up Both;2 sets;10 reps;Hand Hold: 2;Step Height: 6"   Rebounder 3 way weight shift 1 min each way  using one UE only   Knee/Hip Exercises: Seated   Sit to Sand --  Rockerboard for ankles 3 min                  PT Short Term Goals - 05/08/16 1125    PT SHORT TERM GOAL #2   Title berg balance score >/= 45/56    Time 4   Period Weeks   Status On-going  Will do at end of week.   PT SHORT TERM GOAL #3   Title improved confidence with walking outside increased >/= 25%   Time 4   Period Weeks   Status On-going           PT Long Term Goals - 04/24/16 1130    PT LONG TERM GOAL #1   Title independent with HEP   Time 8   Period Weeks   Status New   PT LONG TERM GOAL #2   Title Berg balance score >/= 50/56   Time 8   Period Weeks   Status New   PT LONG TERM GOAL #3   Title improved confidence with walking outside >/= 75% due to increased lower extremity strength   Time 8   Period Weeks   Status New   PT LONG TERM GOAL #4  Title improved trunk strength  so she is able to stand >/= 4 feet 9 inches   Time 8   Period Weeks   Status New               Plan - 05/08/16 1106    Clinical Impression Statement Pt had a lot of shoulder pain after doing the arm bike so she declined that exercise today. Pt reports her confidence is 25% improved and her legs feels stronger. Increased reps slightly today which she tolerated just fine.   Rehab Potential Good   Clinical Impairments Affecting Rehab Potential None   PT Frequency 2x / week   PT Duration 8 weeks   PT Treatment/Interventions Gait training;Functional mobility training;Therapeutic activities;Therapeutic exercise;Balance training;Patient/family education;Neuromuscular re-education;Manual techniques;Electrical Stimulation;Moist Heat;Energy conservation;Passive range of motion   PT Next Visit Plan BERG   Consulted and Agree with Plan of Care --      Patient will benefit from skilled therapeutic intervention in order to improve the following deficits and impairments:  Abnormal gait, Increased fascial restricitons, Decreased mobility, Postural dysfunction, Decreased activity tolerance, Decreased endurance,  Decreased range of motion, Decreased strength, Decreased balance, Difficulty walking  Visit Diagnosis: Muscle weakness (generalized)  Other abnormalities of gait and mobility     Problem List Patient Active Problem List   Diagnosis Date Noted  . Acute blood loss anemia 09/27/2015  . Pelvic fracture (HCC) 09/21/2015  . Closed fracture of pelvis (HCC)   . Kyphoscoliosis 09/16/2013  . Mitral valve prolapse 05/24/2011  . Hypercholesterolemia 05/24/2011    COCHRAN,JENNIFER, PTA 05/08/2016, 11:34 AM  [facility] [address], [address] [place], [place], [address] Phone: [phone]   Fax:  [phone]  Name: Madison Odom MRN: [id number] Date of Birth: November 03, 1934

## 2016-05-10 ENCOUNTER — Ambulatory Visit: Payer: Medicare Other | Admitting: Physical Therapy

## 2016-05-10 ENCOUNTER — Encounter: Payer: Self-pay | Admitting: Physical Therapy

## 2016-05-10 DIAGNOSIS — R2689 Other abnormalities of gait and mobility: Secondary | ICD-10-CM

## 2016-05-10 DIAGNOSIS — M6281 Muscle weakness (generalized): Secondary | ICD-10-CM | POA: Diagnosis not present

## 2016-05-10 NOTE — Therapy (Signed)
[facility] [address], [place], [place], [address] Phone: [phone]   Fax:  [phone]  Physical Therapy Treatment  Patient Details  Name: Madison Odom MRN: [id number] Date of Birth: 1934/05/01 Referring Provider: Dr. Dorothyann Peng  Encounter Date: 05/10/2016      PT End of Session - 05/10/16 1403    Visit Number 6   Number of Visits 10   Date for PT Re-Evaluation 06/19/16   Authorization Type medicare; g-codes on 10 visit   PT Start Time 1401   PT Stop Time 1440   PT Time Calculation (min) 39 min   Activity Tolerance Patient tolerated treatment well   Behavior During Therapy [facility] for tasks assessed/performed      Past Medical History  Diagnosis Date  . MVP (mitral valve prolapse)   . Hyperlipidemia   . OP (osteoporosis)   . Spastic colon   . Pelvis fracture ([place]) 09/2015    CLOSED   . GERD (gastroesophageal reflux disease)   . Scoliosis     Past Surgical History  Procedure Laterality Date  . Appendectomy    . Bilateral oophorectomy  1981  . Rotator cuff repair Right   . Eye surgery    . Abdominal hysterectomy  1965    There were no vitals filed for this visit.      Subjective Assessment - 05/10/16 1407    Subjective Shoulders feel much better. My confidence in my steadiness has improved.   Currently in Pain? No/denies            [facility] PT Assessment - 05/10/16 0001    Berg Balance Test   Sit to Stand Able to stand without using hands and stabilize independently   Standing Unsupported Able to stand safely 2 minutes   Sitting with Back Unsupported but Feet Supported on Floor or Stool Able to sit safely and securely 2 minutes   Stand to Sit Sits safely with minimal use of hands   Transfers Able to transfer safely, minor use of hands   Standing Unsupported with Eyes Closed Able to stand 10 seconds safely   Standing Ubsupported with Feet Together Able to place feet together  independently and stand 1 minute safely   From Standing, Reach Forward with Outstretched Arm Can reach confidently >25 cm (10")  10 inches   From Standing Position, Pick up Object from Floor Able to pick up shoe safely and easily   From Standing Position, Turn to Look Behind Over each Shoulder Looks behind from both sides and weight shifts well   Turn 360 Degrees Able to turn 360 degrees safely one side only in 4 seconds or less   Standing Unsupported, Alternately Place Feet on Step/Stool Able to stand independently and safely and complete 8 steps in 20 seconds   Standing Unsupported, One Foot in Front Able to place foot tandem independently and hold 30 seconds   Standing on One Leg Able to lift leg independently and hold > 10 seconds   Total Score 55   Berg comment: 25% chance of falling                     [facility] Adult PT Treatment/Exercise - 05/10/16 0001    Knee/Hip Exercises: Standing   Heel Raises Both;20 reps   Hip Abduction Stengthening;Both  2 x15   Hip Extension Stengthening;Both  2 x15   Forward Step Up Both;2 sets;10 reps;Hand Hold: 2;Step Height: 6"  PT Short Term Goals - 05/10/16 1427    PT SHORT TERM GOAL #2   Title berg balance score >/= 45/56    Time 4   Period Weeks   Status Achieved  55/56   PT SHORT TERM GOAL #3   Title improved confidence with walking outside increased >/= 25%   Time 4   Period Weeks   Status Achieved  25%           PT Long Term Goals - 04/24/16 1130    PT LONG TERM GOAL #1   Title independent with HEP   Time 8   Period Weeks   Status New   PT LONG TERM GOAL #2   Title Berg balance score >/= 50/56   Time 8   Period Weeks   Status New   PT LONG TERM GOAL #3   Title improved confidence with walking outside >/= 75% due to increased lower extremity strength   Time 8   Period Weeks   Status New   PT LONG TERM GOAL #4   Title improved trunk strength  so she is able to stand >/= 4 feet 9  inches   Time 8   Period Weeks   Status New               Plan - 05/10/16 1406    Clinical Impression Statement Pt scored 55/56 pts on BERG.meeting goals. All STG met , confidence 25% improved.   Rehab Potential Good   PT Frequency 2x / week   PT Duration 8 weeks   PT Treatment/Interventions Gait training;Functional mobility training;Therapeutic activities;Therapeutic exercise;Balance training;Patient/family education;Neuromuscular re-education;Manual techniques;Electrical Stimulation;Moist Heat;Energy conservation;Passive range of motion   PT Next Visit Plan Add 1# to standing exercises   Consulted and Agree with Plan of Care Patient      Patient will benefit from skilled therapeutic intervention in order to improve the following deficits and impairments:  Abnormal gait, Increased fascial restricitons, Decreased mobility, Postural dysfunction, Decreased activity tolerance, Decreased endurance, Decreased range of motion, Decreased strength, Decreased balance, Difficulty walking  Visit Diagnosis: Muscle weakness (generalized)  Other abnormalities of gait and mobility     Problem List Patient Active Problem List   Diagnosis Date Noted  . Acute blood loss anemia 09/27/2015  . Pelvic fracture ([place]) 09/21/2015  . Closed fracture of pelvis ([place])   . Kyphoscoliosis 09/16/2013  . Mitral valve prolapse 05/24/2011  . Hypercholesterolemia 05/24/2011    Madison Odom, PTA 05/10/2016, 2:39 PM  [facility] [address] [place], [place], [address] Phone: [phone]   Fax:  [phone]  Name: Madison Odom MRN: [id number] Date of Birth: 1934-03-22

## 2016-05-15 ENCOUNTER — Ambulatory Visit: Payer: Medicare Other | Attending: Adult Health | Admitting: Physical Therapy

## 2016-05-15 ENCOUNTER — Encounter: Payer: Self-pay | Admitting: Physical Therapy

## 2016-05-15 DIAGNOSIS — M6281 Muscle weakness (generalized): Secondary | ICD-10-CM

## 2016-05-15 DIAGNOSIS — R2689 Other abnormalities of gait and mobility: Secondary | ICD-10-CM | POA: Insufficient documentation

## 2016-05-15 NOTE — Therapy (Signed)
[facility] [address], [address] [place], [place], [address] Phone: [phone]   Fax:  [phone]  Physical Therapy Treatment  Patient Details  Name: Madison Odom MRN: [id number] Date of Birth: Mar 06, 1934 Referring Provider: Dr. Shirline Freesory Nafziger  Encounter Date: 05/15/2016      PT End of Session - 05/15/16 1128    Visit Number 7   Number of Visits 10   Date for PT Re-Evaluation 06/19/16   Authorization Type medicare; g-codes on 10 visit   PT Start Time 1052   PT Stop Time 1130   PT Time Calculation (min) 38 min   Activity Tolerance Patient tolerated treatment well   Behavior During Therapy [facility] for tasks assessed/performed      Past Medical History  Diagnosis Date  . MVP (mitral valve prolapse)   . Hyperlipidemia   . OP (osteoporosis)   . Spastic colon   . Pelvis fracture ([facility]) 09/2015    CLOSED   . GERD (gastroesophageal reflux disease)   . Scoliosis     Past Surgical History  Procedure Laterality Date  . Appendectomy    . Bilateral oophorectomy  1981  . Rotator cuff repair Right   . Eye surgery    . Abdominal hysterectomy  1965    There were no vitals filed for this visit.      Subjective Assessment - 05/15/16 1057    Subjective feels like balance is better, still a little nervous about walking outside   Currently in Pain? No/denies                         [facility] Adult PT Treatment/Exercise - 05/15/16 0001    Knee/Hip Exercises: Standing   Heel Raises Both;2 sets;10 reps   Hip Abduction Stengthening;Both;2 sets;15 reps  1#   Hip Extension Stengthening;Both;2 sets;15 reps  1#   Lateral Step Up Both;2 sets;10 reps   Forward Step Up Both;2 sets;10 reps;Hand Hold: 2   Other Standing Knee Exercises on blue foam: mini squats, wt shifts, heel raises all x 20   Other Standing Knee Exercises step ups x 10 each LE onto bosu with min A   Knee/Hip Exercises: Seated   Other Seated Knee/Hip  Exercises seated on theraball wt shifts lateral/ap/circles with focus on core strengthening and control   Shoulder Exercises: Seated   Retraction Both;20 reps  tactile cues   Horizontal ABduction Strengthening;20 reps   Other Seated Exercises D2 red theraband 2 x 10                PT Education - 05/15/16 1128    Education provided No          PT Short Term Goals - 05/10/16 1427    PT SHORT TERM GOAL #2   Title berg balance score >/= 45/56    Time 4   Period Weeks   Status Achieved  55/56   PT SHORT TERM GOAL #3   Title improved confidence with walking outside increased >/= 25%   Time 4   Period Weeks   Status Achieved  25%           PT Long Term Goals - 05/15/16 1131    PT LONG TERM GOAL #1   Title independent with HEP   Time 8   Period Weeks   Status On-going   PT LONG TERM GOAL #2   Title Berg balance score >/= 50/56   Status Achieved  PT LONG TERM GOAL #3   Title improved confidence with walking outside >/= 75% due to increased lower extremity strength   Time 8   Period Weeks   Status On-going   PT LONG TERM GOAL #4   Title improved trunk strength  so she is able to stand >/= 4 feet 9 inches   Time 8   Period Weeks   Status On-going               Plan - 05/15/16 1129    Clinical Impression Statement Pt states she still feels more confident with walking and does not fear falling as much, pt continues iwth core weakness and wishes to improve posture   Rehab Potential Good   PT Frequency 2x / week   PT Duration 8 weeks   PT Treatment/Interventions Gait training;Functional mobility training;Therapeutic activities;Therapeutic exercise;Balance training;Patient/family education;Neuromuscular re-education;Manual techniques;Electrical Stimulation;Moist Heat;Energy conservation;Passive range of motion   PT Next Visit Plan progress balance on oneven surfaces   Consulted and Agree with Plan of Care Patient      Patient will benefit from  skilled therapeutic intervention in order to improve the following deficits and impairments:  Abnormal gait, Increased fascial restricitons, Decreased mobility, Postural dysfunction, Decreased activity tolerance, Decreased endurance, Decreased range of motion, Decreased strength, Decreased balance, Difficulty walking  Visit Diagnosis: Muscle weakness (generalized)  Other abnormalities of gait and mobility     Problem List Patient Active Problem List   Diagnosis Date Noted  . Acute blood loss anemia 09/27/2015  . Pelvic fracture ([facility]) 09/21/2015  . Closed fracture of pelvis ([facility])   . Kyphoscoliosis 09/16/2013  . Mitral valve prolapse 05/24/2011  . Hypercholesterolemia 05/24/2011    Reggy EyeKaren Donawerth, PT, DPT  05/15/2016, 11:32 AM  [facility] [address], [address] [place], [place], [address] Phone: [phone]   Fax:  [phone]  Name: Madison Odom MRN: [id number] Date of Birth: September 15, 1934

## 2016-05-17 ENCOUNTER — Ambulatory Visit: Payer: Medicare Other | Admitting: Physical Therapy

## 2016-05-17 ENCOUNTER — Encounter: Payer: Self-pay | Admitting: Physical Therapy

## 2016-05-17 DIAGNOSIS — M6281 Muscle weakness (generalized): Secondary | ICD-10-CM | POA: Diagnosis not present

## 2016-05-17 DIAGNOSIS — R2689 Other abnormalities of gait and mobility: Secondary | ICD-10-CM

## 2016-05-17 NOTE — Therapy (Signed)
[facility] [address], [address] [place], [place], [address] Phone: [phone]   Fax:  [phone]  Physical Therapy Treatment  Patient Details  Name: Madison Odom MRN: [id number] Date of Birth: 08/19/1934 Referring Provider: Dr. Shirline Freesory Nafziger  Encounter Date: 05/17/2016      PT End of Session - 05/17/16 1104    Visit Number 8   Number of Visits 10   Date for PT Re-Evaluation 06/19/16   Authorization Type medicare; g-codes on 10 visit   PT Start Time 1053   PT Stop Time 1131   PT Time Calculation (min) 38 min   Activity Tolerance Patient tolerated treatment well   Behavior During Therapy [facility] for tasks assessed/performed      Past Medical History  Diagnosis Date  . MVP (mitral valve prolapse)   . Hyperlipidemia   . OP (osteoporosis)   . Spastic colon   . Pelvis fracture ([facility]) 09/2015    CLOSED   . GERD (gastroesophageal reflux disease)   . Scoliosis     Past Surgical History  Procedure Laterality Date  . Appendectomy    . Bilateral oophorectomy  1981  . Rotator cuff repair Right   . Eye surgery    . Abdominal hysterectomy  1965    There were no vitals filed for this visit.      Subjective Assessment - 05/17/16 1104    Subjective I am doing the step exercises at home on my stairs.    Currently in Pain? No/denies   Multiple Pain Sites No                         [facility] Adult PT Treatment/Exercise - 05/17/16 0001    Knee/Hip Exercises: Aerobic   Nustep L2 x 10 min   Knee/Hip Exercises: Standing   Heel Raises Both;2 sets;10 reps  Done on Airex   Hip Abduction Stengthening;Both;2 sets;20 reps;Knee straight  1#   Hip Extension Stengthening;Left;2 sets;20 reps;Knee straight  1#   Lateral Step Up Both;2 sets;10 reps  VC to contract at [employer]>   Forward Step Up Both;2 sets;10 reps;Hand Hold: 2   Rebounder 3 way weight shift 1 min each way  using one UE only and higer knees   Other  Standing Knee Exercises Mini squats on Airex 10x   Knee/Hip Exercises: Seated   Long Arc Quad Strengthening;Both;2 sets;10 reps;Weights   Long Arc Quad Weight 1 lbs.   Ball Squeeze 30x   Other Seated Knee/Hip Exercises seated on theraball wt shifts lateral  with focus on core strengthening and control   Shoulder Exercises: Seated   Horizontal ABduction --  stopped due to pain in Rt shoulder                  PT Short Term Goals - 05/10/16 1427    PT SHORT TERM GOAL #2   Title berg balance score >/= 45/56    Time 4   Period Weeks   Status Achieved  55/56   PT SHORT TERM GOAL #3   Title improved confidence with walking outside increased >/= 25%   Time 4   Period Weeks   Status Achieved  25%           PT Long Term Goals - 05/15/16 1131    PT LONG TERM GOAL #1   Title independent with HEP   Time 8   Period Weeks   Status On-going  PT LONG TERM GOAL #2   Title Berg balance score >/= 50/56   Status Achieved   PT LONG TERM GOAL #3   Title improved confidence with walking outside >/= 75% due to increased lower extremity strength   Time 8   Period Weeks   Status On-going   PT LONG TERM GOAL #4   Title improved trunk strength  so she is able to stand >/= 4 feet 9 inches   Time 8   Period Weeks   Status On-going               Plan - 05/17/16 1105    Clinical Impression Statement Pt doing well with more work during the PT session, still remains limited in her RT shoulder due to pain.    Rehab Potential Good   Clinical Impairments Affecting Rehab Potential None   PT Frequency 2x / week   PT Duration 8 weeks   PT Treatment/Interventions Gait training;Functional mobility training;Therapeutic activities;Therapeutic exercise;Balance training;Patient/family education;Neuromuscular re-education;Manual techniques;Electrical Stimulation;Moist Heat;Energy conservation;Passive range of motion   Consulted and Agree with Plan of Care Patient      Patient will  benefit from skilled therapeutic intervention in order to improve the following deficits and impairments:  Abnormal gait, Increased fascial restricitons, Decreased mobility, Postural dysfunction, Decreased activity tolerance, Decreased endurance, Decreased range of motion, Decreased strength, Decreased balance, Difficulty walking  Visit Diagnosis: Muscle weakness (generalized)  Other abnormalities of gait and mobility     Problem List Patient Active Problem List   Diagnosis Date Noted  . Acute blood loss anemia 09/27/2015  . Pelvic fracture ([facility]) 09/21/2015  . Closed fracture of pelvis ([facility])   . Kyphoscoliosis 09/16/2013  . Mitral valve prolapse 05/24/2011  . Hypercholesterolemia 05/24/2011    Azari Hasler, PTA 05/17/2016, 11:31 AM  [facility] [address], [address] [place], [place], [address] Phone: [phone]   Fax:  [phone]  Name: Madison Odom MRN: [id number] Date of Birth: Jun 27, 1934

## 2016-05-22 ENCOUNTER — Encounter: Payer: Self-pay | Admitting: Physical Therapy

## 2016-05-22 ENCOUNTER — Ambulatory Visit: Payer: Medicare Other | Admitting: Physical Therapy

## 2016-05-22 DIAGNOSIS — M6281 Muscle weakness (generalized): Secondary | ICD-10-CM | POA: Diagnosis not present

## 2016-05-22 DIAGNOSIS — R2689 Other abnormalities of gait and mobility: Secondary | ICD-10-CM

## 2016-05-22 NOTE — Therapy (Signed)
[facility] [address], [place], [place], [address] Phone: [phone]   Fax:  [phone]  Physical Therapy Treatment  Patient Details  Name: Madison Odom MRN: [id number] Date of Birth: 1934/04/22 Referring Provider: Dr. Dorothyann Peng  Encounter Date: 05/22/2016      PT End of Session - 05/22/16 1104    Visit Number 9   Number of Visits 10   Date for PT Re-Evaluation 06/19/16   Authorization Type medicare; g-codes on 10 visit   PT Start Time 1057   PT Stop Time 1135   PT Time Calculation (min) 38 min   Activity Tolerance Patient tolerated treatment well   Behavior During Therapy [facility] for tasks assessed/performed      Past Medical History  Diagnosis Date  . MVP (mitral valve prolapse)   . Hyperlipidemia   . OP (osteoporosis)   . Spastic colon   . Pelvis fracture ([place]) 09/2015    CLOSED   . GERD (gastroesophageal reflux disease)   . Scoliosis     Past Surgical History  Procedure Laterality Date  . Appendectomy    . Bilateral oophorectomy  1981  . Rotator cuff repair Right   . Eye surgery    . Abdominal hysterectomy  1965    There were no vitals filed for this visit.      Subjective Assessment - 05/22/16 1105    Subjective Continues to be pleased. Is planning on doing a lot of outings with her grand daughter this week.    Currently in Pain? No/denies            [facility] PT Assessment - 05/22/16 0001    Posture/Postural Control   Posture Comments 4.9 feet                     [facility] Adult PT Treatment/Exercise - 05/22/16 0001    Knee/Hip Exercises: Aerobic   Nustep L3 x 10 min   Knee/Hip Exercises: Standing   Heel Raises Both;2 sets;10 reps   Hip Abduction Stengthening;Both;2 sets;20 reps;Knee straight  2# added   Hip Extension Stengthening;Left;2 sets;20 reps;Knee straight  2#   Lateral Step Up Both;2 sets;10 reps  VC to contract at [employer];Hand Hold: 2;15 reps   Rebounder 3 way weight shift 1 min each way  using one UE only and higer knees   Knee/Hip Exercises: Seated   Long Arc Quad Strengthening;Both;3 sets;10 reps;Weights   Long Arc Quad Weight 2 lbs.   Ball Squeeze 30x                  PT Short Term Goals - 05/10/16 1427    PT SHORT TERM GOAL #2   Title berg balance score >/= 45/56    Time 4   Period Weeks   Status Achieved  55/56   PT SHORT TERM GOAL #3   Title improved confidence with walking outside increased >/= 25%   Time 4   Period Weeks   Status Achieved  25%           PT Long Term Goals - 05/22/16 1118    PT LONG TERM GOAL #1   Title independent with HEP   Time 8   Period Weeks   Status On-going               Plan - 05/22/16 1105    Clinical Impression Statement Continues to do well, confidence  increasing still, legs feeling stronger and stronger, able to do all LE strength exercises with 2# today. Pt met goal of being able to stand 4.9 feet tall .    Rehab Potential Good   Clinical Impairments Affecting Rehab Potential None   PT Frequency 2x / week   PT Treatment/Interventions Gait training;Functional mobility training;Therapeutic activities;Therapeutic exercise;Balance training;Patient/family education;Neuromuscular re-education;Manual techniques;Electrical Stimulation;Moist Heat;Energy conservation;Passive range of motion   PT Next Visit Plan  FOTO & G codes next, progress balance on oneven surfaces   Consulted and Agree with Plan of Care Patient      Patient will benefit from skilled therapeutic intervention in order to improve the following deficits and impairments:  Abnormal gait, Increased fascial restricitons, Decreased mobility, Postural dysfunction, Decreased activity tolerance, Decreased endurance, Decreased range of motion, Decreased strength, Decreased balance, Difficulty walking  Visit Diagnosis: Muscle weakness (generalized)  Other abnormalities of  gait and mobility     Problem List Patient Active Problem List   Diagnosis Date Noted  . Acute blood loss anemia 09/27/2015  . Pelvic fracture ([place]) 09/21/2015  . Closed fracture of pelvis ([place])   . Kyphoscoliosis 09/16/2013  . Mitral valve prolapse 05/24/2011  . Hypercholesterolemia 05/24/2011    COCHRAN,JENNIFER, PTA 05/22/2016, 11:31 AM  [facility] [address] [place], [place], [address] Phone: [phone]   Fax:  [phone]  Name: Madison Odom MRN: [id number] Date of Birth: [birth year]

## 2016-05-24 ENCOUNTER — Encounter: Payer: Self-pay | Admitting: Physical Therapy

## 2016-05-24 ENCOUNTER — Ambulatory Visit: Payer: Medicare Other | Admitting: Physical Therapy

## 2016-05-24 DIAGNOSIS — M6281 Muscle weakness (generalized): Secondary | ICD-10-CM | POA: Diagnosis not present

## 2016-05-24 DIAGNOSIS — R2689 Other abnormalities of gait and mobility: Secondary | ICD-10-CM

## 2016-05-24 NOTE — Therapy (Signed)
[facility] [address], [address] [place], [place], [address] Phone: [phone]   Fax:  [phone]  Physical Therapy Treatment  Patient Details  Name: Madison Odom MRN: [id number] Date of Birth: [birth year] Referring Provider: Dr. Shirline Freesory Nafziger  Encounter Date: 05/24/2016      PT End of Session - 05/24/16 1109    Visit Number 10   Number of Visits 10   Date for PT Re-Evaluation 06/19/16   Authorization Type medicare; g-codes on 10 visit   PT Start Time 1058   PT Stop Time 1136   PT Time Calculation (min) 38 min   Activity Tolerance Patient tolerated treatment well   Behavior [facility] for tasks assessed/performed      Past Medical History  Diagnosis Date  . MVP (mitral valve prolapse)   . Hyperlipidemia   . OP (osteoporosis)   . Spastic colon   . Pelvis fracture (HCC) 09/2015    CLOSED   . GERD (gastroesophageal reflux disease)   . Scoliosis     Past Surgical History  Procedure Laterality Date  . Appendectomy    . Bilateral oophorectomy  1981  . Rotator cuff repair Right   . Eye surgery    . Abdominal hysterectomy  1965    There were no vitals filed for this visit.      Subjective Assessment - 05/24/16 1109    Subjective No new complaints, felt ok after last tx, tolerating the wts just fine.   Currently in Pain? No/denies                         [facility] Adult PT Treatment/Exercise - 05/24/16 0001    Knee/Hip Exercises: Aerobic   Nustep L3 x 10 min   Knee/Hip Exercises: Standing   Heel Raises Both;2 sets;10 reps   Hip Abduction Stengthening;Both;2 sets;20 reps;Knee straight  2# added   Hip Extension Stengthening;Left;2 sets;20 reps;Knee straight  2#   Lateral Step Up Both;2 sets;10 reps  VC to contract at [employer]>   Forward Step Up [employer]Both;2 sets;Hand Hold: 2;15 reps   Rebounder 3 way weight shift 1 min each way  using one UE only and higer knees   Knee/Hip Exercises:  Seated   Long Arc Quad Strengthening;Both;3 sets;10 reps;Weights   Long Arc Quad Weight 2 lbs.   Ball Squeeze 30x                  PT Short Term Goals - 05/10/16 1427    PT SHORT TERM GOAL #2   Title berg balance score >/= 45/56    Time 4   Period Weeks   Status Achieved  55/56   PT SHORT TERM GOAL #3   Title improved confidence with walking outside increased >/= 25%   Time 4   Period Weeks   Status Achieved  25%           PT Long Term Goals - 05/22/16 1118    PT LONG TERM GOAL #1   Title independent with HEP   Time 8   Period Weeks   Status On-going               Plan - 05/24/16 1110    Clinical Impression Statement Pt doing excellent, reports confidence 50% improved. She feels good going to the store now and can self correct her posture.    Rehab Potential Good   Clinical Impairments Affecting Rehab Potential None  PT Frequency 2x / week   PT Duration 8 weeks   PT Treatment/Interventions Gait training;Functional mobility training;Therapeutic activities;Therapeutic exercise;Balance training;Patient/family education;Neuromuscular re-education;Manual techniques;Electrical Stimulation;Moist Heat;Energy conservation;Passive range of motion   PT Next Visit Plan Balance/curbs outside if not too hot.    Consulted and Agree with Plan of Care Patient      Patient will benefit from skilled therapeutic intervention in order to improve the following deficits and impairments:  Abnormal gait, Increased fascial restricitons, Decreased mobility, Postural dysfunction, Decreased activity tolerance, Decreased endurance, Decreased range of motion, Decreased strength, Decreased balance, Difficulty walking  Visit Diagnosis: Muscle weakness (generalized)  Other abnormalities of gait and mobility       G-Codes - 2016/06/17 1115    Functional Assessment Tool Used BERG balance is now 55/56: goal was 49/56.   Functional Limitation Mobility: Walking and moving around    Mobility: Walking and Moving Around Current Status [phone]) At least 1 percent but less than 20 percent impaired, limited or restricted   Mobility: Walking and Moving Around Goal Status [phone]) At least 1 percent but less than 20 percent impaired, limited or restricted      Problem List Patient Active Problem List   Diagnosis Date Noted  . Acute blood loss anemia 09/27/2015  . Pelvic fracture (HCC) 09/21/2015  . Closed fracture of pelvis (HCC)   . Kyphoscoliosis 09/16/2013  . Mitral valve prolapse 05/24/2011  . Hypercholesterolemia 05/24/2011    Ane Payment, PTA 2016/06/17 11:38 AM   [facility] [address] [place], [place], [address] Phone: [phone]   Fax:  [phone]  Name: Madison Odom MRN: [id number] Date of Birth: 1934-02-16

## 2016-05-29 ENCOUNTER — Ambulatory Visit: Payer: Medicare Other

## 2016-05-29 ENCOUNTER — Ambulatory Visit (INDEPENDENT_AMBULATORY_CARE_PROVIDER_SITE_OTHER): Payer: Medicare Other | Admitting: Cardiology

## 2016-05-29 VITALS — BP 138/80 | HR 76 | Ht <= 58 in | Wt 85.4 lb

## 2016-05-29 DIAGNOSIS — D649 Anemia, unspecified: Secondary | ICD-10-CM | POA: Diagnosis not present

## 2016-05-29 DIAGNOSIS — E7800 Pure hypercholesterolemia, unspecified: Secondary | ICD-10-CM | POA: Diagnosis not present

## 2016-05-29 DIAGNOSIS — R2689 Other abnormalities of gait and mobility: Secondary | ICD-10-CM

## 2016-05-29 DIAGNOSIS — D508 Other iron deficiency anemias: Secondary | ICD-10-CM

## 2016-05-29 DIAGNOSIS — M419 Scoliosis, unspecified: Secondary | ICD-10-CM | POA: Diagnosis not present

## 2016-05-29 DIAGNOSIS — E785 Hyperlipidemia, unspecified: Secondary | ICD-10-CM

## 2016-05-29 DIAGNOSIS — M6281 Muscle weakness (generalized): Secondary | ICD-10-CM

## 2016-05-29 DIAGNOSIS — I10 Essential (primary) hypertension: Secondary | ICD-10-CM

## 2016-05-29 DIAGNOSIS — I341 Nonrheumatic mitral (valve) prolapse: Secondary | ICD-10-CM

## 2016-05-29 MED ORDER — OMEPRAZOLE 20 MG PO CPDR: 20.0000 mg | DELAYED_RELEASE_CAPSULE | Freq: Every day | ORAL | Status: DC

## 2016-05-29 MED ORDER — NADOLOL 80 MG PO TABS: 80.0000 mg | ORAL_TABLET | Freq: Every day | ORAL | Status: DC

## 2016-05-29 NOTE — Patient Instructions (Addendum)
Medication Instructions:  Your physician recommends that you continue on your current medications as directed. Please refer to the Current Medication list given to you today.   Labwork:NONE   Testing/Procedures: NONE Follow-Up: Your physician recommends that you schedule a follow-up appointment in:  3 MONTHS  WITH  DR Delton SeeNELSON  Any Other Special Instructions Will Be Listed Below (If Applicable).     If you need a refill on your cardiac medications before your next appointment, please call your pharmacy.

## 2016-05-29 NOTE — Therapy (Signed)
[facility] [address], [address] [place], [place], [address] Phone: [phone]   Fax:  [phone]  Physical Therapy Treatment  Patient Details  Name: Madison Odom MRN: [id number] Date of Birth: Jan 28, 1934 Referring Provider: Dr. Shirline Freesory Nafziger  Encounter Date: 05/29/2016      PT End of Session - 05/29/16 1431    Visit Number 11   Number of Visits 20   Date for PT Re-Evaluation 06/19/16   Authorization Type medicare; g-codes at visit 20   PT Start Time 1400   PT Stop Time 1440   PT Time Calculation (min) 40 min   Activity Tolerance Patient tolerated treatment well   Behavior During Therapy [facility] for tasks assessed/performed      Past Medical History  Diagnosis Date  . MVP (mitral valve prolapse)   . Hyperlipidemia   . OP (osteoporosis)   . Spastic colon   . Pelvis fracture (HCC) 09/2015    CLOSED   . GERD (gastroesophageal reflux disease)   . Scoliosis     Past Surgical History  Procedure Laterality Date  . Appendectomy    . Bilateral oophorectomy  1981  . Rotator cuff repair Right   . Eye surgery    . Abdominal hysterectomy  1965    There were no vitals filed for this visit.      Subjective Assessment - 05/29/16 1403    Subjective I've been busy today.     Currently in Pain? No/denies                         [facility] Adult PT Treatment/Exercise - 05/29/16 0001    Knee/Hip Exercises: Aerobic   Nustep L3 x 10 min  arms only   Knee/Hip Exercises: Standing   Heel Raises Both;2 sets;10 reps   Hip Abduction Stengthening;Both;2 sets;20 reps;Knee straight  2# added   Hip Extension Stengthening;Left;2 sets;20 reps;Knee straight  2#   Lateral Step Up Both;2 sets;10 reps  VC to contract at [employer]>   Forward Step Up [employer]Both;2 sets;Hand Hold: 2;15 reps   Rebounder 3 way weight shift 1 min each way  using one UE only and higer knees   Knee/Hip Exercises: Seated   Long Arc Quad  Strengthening;Both;3 sets;10 reps;Weights   Long Arc Quad Weight 2 lbs.   Ball Squeeze 30x                  PT Short Term Goals - 05/10/16 1427    PT SHORT TERM GOAL #2   Title berg balance score >/= 45/56    Time 4   Period Weeks   Status Achieved  55/56   PT SHORT TERM GOAL #3   Title improved confidence with walking outside increased >/= 25%   Time 4   Period Weeks   Status Achieved  25%           PT Long Term Goals - 05/29/16 1405    PT LONG TERM GOAL #1   Title independent with HEP   Time 8   Period Weeks   Status On-going   PT LONG TERM GOAL #2   Title Berg balance score >/= 50/56   Status Achieved   PT LONG TERM GOAL #3   Title improved confidence with walking outside >/= 75% due to increased lower extremity strength   Time 8   Period Weeks   Status On-going   PT LONG TERM GOAL #4  Title improved trunk strength  so she is able to stand >/= 4 feet 9 inches   Time 8   Period Weeks   Status On-going               Plan - 05/29/16 1406    Clinical Impression Statement Pt reports 50% overall improved confidence with gait since the start of care.  Berg balance score is improved to 55/56 which indicated reduced falls risk.  Pt is working to correct her posture with all standing and is limited by scoliotic/kyhpotic curve.  Pt will continue to benefit from skilled PT for endurance and strength progression.     Rehab Potential Good   PT Frequency 2x / week   PT Duration 8 weeks   PT Treatment/Interventions Gait training;Functional mobility training;Therapeutic activities;Therapeutic exercise;Balance training;Patient/family education;Neuromuscular re-education;Manual techniques;Electrical Stimulation;Moist Heat;Energy conservation;Passive range of motion   PT Next Visit Plan Balance, endurance, strength progression   Consulted and Agree with Plan of Care Patient      Patient will benefit from skilled therapeutic intervention in order to improve  the following deficits and impairments:  Abnormal gait, Increased fascial restricitons, Decreased mobility, Postural dysfunction, Decreased activity tolerance, Decreased endurance, Decreased range of motion, Decreased strength, Decreased balance, Difficulty walking  Visit Diagnosis: Muscle weakness (generalized)  Other abnormalities of gait and mobility     Problem List Patient Active Problem List   Diagnosis Date Noted  . Acute blood loss anemia 09/27/2015  . Pelvic fracture (HCC) 09/21/2015  . Closed fracture of pelvis (HCC)   . Kyphoscoliosis 09/16/2013  . Mitral valve prolapse 05/24/2011  . Hypercholesterolemia 05/24/2011     Lorrene Reid, PT 05/29/2016 2:41 PM  [facility] [address], [address] [place], [place], [address] Phone: [phone]   Fax:  [phone]  Name: Madison Odom MRN: [id number] Date of Birth: 11/23/1933

## 2016-05-29 NOTE — Progress Notes (Signed)
Cardiology Office Note    Date:  05/30/2016   ID:  Madison Odom, DOB [birth year], MRN [id number]  PCP:  Shirline Freesory Nafziger, NP  Cardiologist:   Tobias AlexanderKatarina Zaylen Susman, MD   Chief complain: 6 months follow-up  History of Present Illness:  Madison Odom is a 82 y.o. femalewith h/o mitral valve prolapse and history of hypercholesterolemia. She has a history of scoliosis and osteoporosis.She has terrible osteoarthritis and had a fall on 09/21/15 causing closed fracture of pelvis that did not require surgery. She went to a rehabilitation facility but is now home. She had physical therapy at home. She is doing well. She is walking with a rolling walker. She is not having any cardiac symptoms from her mitral valve prolapse.   This is a first-time visit after transitioning from Dr. Patty SermonsBrackbill to me, she denies any chest pain or shortness of breath no orthopnea, paroxysmal nocturnal dyspnea, no lower extremity edema palpitations or syncope. She is compliant with her meds and has no side effects. She developed anemia after her fracture and was taking iron pills that she didn't tolerate well.   Past Medical History  Diagnosis Date  . MVP (mitral valve prolapse)   . Hyperlipidemia   . OP (osteoporosis)   . Spastic colon   . Pelvis fracture (HCC) 09/2015    CLOSED   . GERD (gastroesophageal reflux disease)   . Scoliosis     Past Surgical History  Procedure Laterality Date  . Appendectomy    . Bilateral oophorectomy  1981  . Rotator cuff repair Right   . Eye surgery    . Abdominal hysterectomy  1965   Current Medications: Outpatient Prescriptions Prior to Visit  Medication Sig Dispense Refill  . aspirin 81 MG EC tablet Take 81 mg by mouth daily.      . Calcium Carb-Cholecalciferol (CALCIUM 500 +D PO) Take 1 tablet by mouth daily.     . Multiple Vitamin (MULTIVITAMIN) tablet Take 1 tablet by mouth daily.    . simvastatin (ZOCOR) 20 MG tablet Take 20 mg by mouth daily.    . nadolol (CORGARD)  80 MG tablet TAKE 1 TABLET BY MOUTH EVERY DAY 90 tablet 1  . Protein (PROCEL PO) Take 2 scoop by mouth 2 (two) times daily. Reported on 04/24/2016     No facility-administered medications prior to visit.   Allergies:   Penicillins; Phenothiazines; and Tetracyclines & related   Social History   Social History  . Marital Status: Married    Spouse Name: N/A  . Number of Children: N/A  . Years of Education: N/A   Social History Main Topics  . Smoking status: Never Smoker   . Smokeless tobacco: Never Used  . Alcohol Use: No  . Drug Use: No  . Sexual Activity: Not on file   Other Topics Concern  . Not on file   Social History Narrative   1 year of college    Widow   Two daughters        Family History:  The patient's family history includes Heart attack in her mother; Heart disease in her father; Hypertension in her father and mother. There is no history of Stroke.   ROS:   Please see the history of present illness.    ROS All other systems reviewed and are negative.  PHYSICAL EXAM:   VS:  BP 138/80 mmHg  Pulse 76  Ht 4\' 10"  (1.473 m)  Wt 85 lb 6.4 oz (38.737 kg)  BMI  17.85 kg/m2   GEN: Well nourished, well developed, in no acute distress HEENT: normal Neck: no JVD, carotid bruits, or masses Cardiac: RRR; no murmurs, rubs, or gallops,no edema  Respiratory:  clear to auscultation bilaterally, normal work of breathing GI: soft, nontender, nondistended, + BS MS: no deformity or atrophy Skin: warm and dry, no rash Neuro:  Alert and Oriented x 3, Strength and sensation are intact Psych: euthymic mood, full affect  Wt Readings from Last 3 Encounters:  05/29/16 85 lb 6.4 oz (38.737 kg)  04/20/16 85 lb (38.556 kg)  12/01/15 82 lb 12.8 oz (37.558 kg)    Studies/Labs Reviewed:   Recent Labs: 09/22/2015: Magnesium 1.8; TSH 0.734 12/01/2015: ALT 14; BUN 24; Creat 0.58*; Potassium 4.1; Sodium 136 04/20/2016: Hemoglobin 11.6*; Platelets 381.0   Lipid Panel    Component  Value Date/Time   CHOL 153 12/01/2015 0838   TRIG 101 12/01/2015 0838   HDL 73 12/01/2015 0838   CHOLHDL 2.1 12/01/2015 0838   VLDL 20 12/01/2015 0838   LDLCALC 60 12/01/2015 0838   TTE: 09/2015 - Left ventricle: The cavity size was normal. Wall thickness was  increased in a pattern of mild LVH. Systolic function was normal.  The estimated ejection fraction was in the range of 60% to 65%.  Wall motion was normal; there were no regional wall motion  abnormalities. Doppler parameters are consistent with abnormal  left ventricular relaxation (grade 1 diastolic dysfunction). - Aortic valve: There was trivial regurgitation. - Mitral valve: Prolapse cannot be excluded. - Tricuspid valve: There was moderate regurgitation. - Pulmonary arteries: PA peak pressure: 62 mm Hg (S).    ASSESSMENT:    1. Low hemoglobin   2. Hypercholesterolemia   3. Kyphoscoliosis   4. Other iron deficiency anemias   5. Mitral valve prolapse   6. HLD (hyperlipidemia)   7. Essential hypertension      PLAN:  In order of problems listed above:  1. The patient is doing well, her most recent echo shows mitral valve prolapse and moderate regurgitation, she also has severe pulmonary hypertension however appears to be minimally symptomatic. No need to repeat echocardiogram at this point. 2. Her Hb is now improved 9.5 --> 11.6.  3. Lipids controlled well on simvastatin 20 and is well tolerated. 4. Her blood pressures well controlled.  Medication Adjustments/Labs and Tests Ordered: Current medicines are reviewed at length with the patient today.  Concerns regarding medicines are outlined above.  Medication changes, Labs and Tests ordered today are listed in the Patient Instructions below. Patient Instructions  Medication Instructions:  Your physician recommends that you continue on your current medications as directed. Please refer to the Current Medication list given to you  today.   Labwork:NONE   Testing/Procedures: NONE Follow-Up: Your physician recommends that you schedule a follow-up appointment in:  3 MONTHS  WITH  DR Delton See  Any Other Special Instructions Will Be Listed Below (If Applicable).     If you need a refill on your cardiac medications before your next appointment, please call your pharmacy.     Signed, Tobias Alexander, MD  05/30/2016 8:09 AM    [facility] [address] [place], [place], [place]  [address] Phone: [phone]; Fax: [phone]

## 2016-05-31 ENCOUNTER — Ambulatory Visit: Payer: Medicare Other

## 2016-05-31 DIAGNOSIS — R2689 Other abnormalities of gait and mobility: Secondary | ICD-10-CM

## 2016-05-31 DIAGNOSIS — M6281 Muscle weakness (generalized): Secondary | ICD-10-CM

## 2016-05-31 NOTE — Therapy (Signed)
[facility] [address], [address] [place], [place], [address] Phone: [phone]   Fax:  [phone]  Physical Therapy Treatment  Patient Details  Name: Madison Odom MRN: [id number] Date of Birth: 04/05/1934 Referring Provider: Dr. [name]  Encounter Date: 05/31/2016      PT End of Session - 05/31/16 1435    Visit Number 12   Number of Visits 20   Date for PT Re-Evaluation 06/19/16   Authorization Type medicare; g-codes at visit 20, KX at 15   PT Start Time 1400   PT Stop Time 1440   PT Time Calculation (min) 40 min   Activity Tolerance Patient tolerated treatment well   Behavior During Therapy [facility] for tasks assessed/performed      Past Medical History  Diagnosis Date  . MVP (mitral valve prolapse)   . Hyperlipidemia   . OP (osteoporosis)   . Spastic colon   . Pelvis fracture (HCC) 09/2015    CLOSED   . GERD (gastroesophageal reflux disease)   . Scoliosis     Past Surgical History  Procedure Laterality Date  . Appendectomy    . Bilateral oophorectomy  1981  . Rotator cuff repair Right   . Eye surgery    . Abdominal hysterectomy  1965    There were no vitals filed for this visit.      Subjective Assessment - 05/31/16 1410    Subjective Felt good after last session.     Currently in Pain? No/denies                         [facility] Adult PT Treatment/Exercise - 05/31/16 0001    Knee/Hip Exercises: Aerobic   Nustep L3 x 10 min  legs only   Knee/Hip Exercises: Standing   Heel Raises Both;2 sets;10 reps   Hip Abduction Stengthening;Both;2 sets;20 reps;Knee straight  2# added   Hip Extension Stengthening;Left;2 sets;20 reps;Knee straight  2#   Lateral Step Up --   Forward Step Up Both;2 sets;20 reps;Hand Hold: 2   Rebounder 3 way weight shift 1 min each way  using one UE only and higer knees   Other Standing Knee Exercises heel raises 2x10   Knee/Hip Exercises: Seated   Long  Arc Quad Strengthening;Both;3 sets;10 reps;Weights   Long Arc Quad Weight 2 lbs.   Ball Squeeze 40x                  PT Short Term Goals - 05/10/16 1427    PT SHORT TERM GOAL #2   Title berg balance score >/= 45/56    Time 4   Period Weeks   Status Achieved  55/56   PT SHORT TERM GOAL #3   Title improved confidence with walking outside increased >/= 25%   Time 4   Period Weeks   Status Achieved  25%           PT Long Term Goals - 05/29/16 1405    PT LONG TERM GOAL #1   Title independent with HEP   Time 8   Period Weeks   Status On-going   PT LONG TERM GOAL #2   Title Berg balance score >/= 50/56   Status Achieved   PT LONG TERM GOAL #3   Title improved confidence with walking outside >/= 75% due to increased lower extremity strength   Time 8   Period Weeks   Status On-going   PT LONG  TERM GOAL #4   Title improved trunk strength  so she is able to stand >/= 4 feet 9 inches   Time 8   Period Weeks   Status On-going               Plan - 05/31/16 1410    Clinical Impression Statement Pt reports 50% overall improved confidence with gait since the start of care.  Berg Balance score is improved to 55/56 which indicates reduced falls risk.  Pt with chronic weakness and balance deficits and is working to correct her posture with all standing and is limited by scoliotic/kyphotic curve.  Pt will continue to benefit from skilled PT for endurance and strength progression.     Rehab Potential Good   PT Frequency 2x / week   PT Duration 8 weeks   PT Treatment/Interventions Gait training;Functional mobility training;Therapeutic activities;Therapeutic exercise;Balance training;Patient/family education;Neuromuscular re-education;Manual techniques;Electrical Stimulation;Moist Heat;Energy conservation;Passive range of motion   PT Next Visit Plan Balance, endurance, strength progression   Consulted and Agree with Plan of Care Patient      Patient will benefit  from skilled therapeutic intervention in order to improve the following deficits and impairments:  Abnormal gait, Increased fascial restricitons, Decreased mobility, Postural dysfunction, Decreased activity tolerance, Decreased endurance, Decreased range of motion, Decreased strength, Decreased balance, Difficulty walking  Visit Diagnosis: Muscle weakness (generalized)  Other abnormalities of gait and mobility     Problem List Patient Active Problem List   Diagnosis Date Noted  . Acute blood loss anemia 09/27/2015  . Pelvic fracture (HCC) 09/21/2015  . Closed fracture of pelvis (HCC)   . Kyphoscoliosis 09/16/2013  . Mitral valve prolapse 05/24/2011  . Hypercholesterolemia 05/24/2011   Lorrene ReidKelly Takacs, PT 05/31/2016 2:47 PM  [facility] [address], [address] [place], [place], [address] Phone: [phone]   Fax:  [phone]  Name: Cheryl Flashatsy A Watchman MRN: [id number] Date of Birth: Jan 15, 1934

## 2016-06-05 ENCOUNTER — Encounter: Payer: Self-pay | Admitting: Physical Therapy

## 2016-06-05 ENCOUNTER — Ambulatory Visit: Payer: Medicare Other | Admitting: Physical Therapy

## 2016-06-05 DIAGNOSIS — R2689 Other abnormalities of gait and mobility: Secondary | ICD-10-CM

## 2016-06-05 DIAGNOSIS — M6281 Muscle weakness (generalized): Secondary | ICD-10-CM | POA: Diagnosis not present

## 2016-06-05 NOTE — Therapy (Signed)
[facility] [address], [address] [place], [place], [address] Phone: [phone]   Fax:  [phone]  Physical Therapy Treatment  Patient Details  Name: Madison Odom MRN: [id number] Date of Birth: 1934-09-06 Referring Provider: Dr. [name]  Encounter Date: 06/05/2016      PT End of Session - 06/05/16 1407    Visit Number 13   Number of Visits 20   Date for PT Re-Evaluation 06/19/16   Authorization Type medicare; g-codes at visit 20, KX at 15   PT Start Time 1401   PT Stop Time 1440   PT Time Calculation (min) 39 min   Activity Tolerance Patient tolerated treatment well   Behavior During Therapy [facility] for tasks assessed/performed      Past Medical History:  Diagnosis Date  . GERD (gastroesophageal reflux disease)   . Hyperlipidemia   . MVP (mitral valve prolapse)   . OP (osteoporosis)   . Pelvis fracture (HCC) 09/2015   CLOSED   . Scoliosis   . Spastic colon     Past Surgical History:  Procedure Laterality Date  . ABDOMINAL HYSTERECTOMY  1965  . APPENDECTOMY    . BILATERAL OOPHORECTOMY  1981  . EYE SURGERY    . ROTATOR CUFF REPAIR Right     There were no vitals filed for this visit.      Subjective Assessment - 06/05/16 1406    Subjective No new complaints.   Currently in Pain? No/denies   Multiple Pain Sites No                         [facility] Adult PT Treatment/Exercise - 06/05/16 0001      Knee/Hip Exercises: Aerobic   Nustep L3 x 10 min  legs only     Knee/Hip Exercises: Standing   Heel Raises Both;2 sets;10 reps   Hip Abduction Stengthening;Both;2 sets;20 reps;Knee straight  2# added   Hip Extension Stengthening;Left;2 sets;20 reps;Knee straight  2#   Forward Step Up Both;2 sets;20 reps;Hand Hold: 2   Rebounder 3 way weight shift 1 min each way  using one UE only and higer knees   Other Standing Knee Exercises heel raises 2x10   Other Standing Knee Exercises Side  stepping at th countertop 2x   VC for posture and LE alignment     Knee/Hip Exercises: Seated   Long Arc Quad Strengthening;Both;3 sets;10 reps;Weights   Long Arc Quad Weight 2 lbs.   Ball Squeeze 40x                PT Education - 06/05/16 1437    Education provided Yes   Education Details HEP progression: side stepping at the counter top   Person(s) Educated Patient   Methods Explanation;Demonstration;Verbal cues   Comprehension Verbalized understanding;Returned demonstration          PT Short Term Goals - 05/10/16 1427      PT SHORT TERM GOAL #2   Title berg balance score >/= 45/56    Time 4   Period Weeks   Status Achieved  55/56     PT SHORT TERM GOAL #3   Title improved confidence with walking outside increased >/= 25%   Time 4   Period Weeks   Status Achieved  25%           PT Long Term Goals - 06/05/16 1415      PT LONG TERM GOAL #1   Title  independent with HEP   Time 8   Period Weeks   Status On-going     PT LONG TERM GOAL #3   Title improved confidence with walking outside >/= 75% due to increased lower extremity strength   Time 8   Period Weeks   Status Achieved               Plan - 06/05/16 1408    Clinical Impression Statement Today pt reports her confidence level has grown to 85%-90%, meeting LTG. Added side stepping for HEP advancement today.    Rehab Potential Good   Clinical Impairments Affecting Rehab Potential None   PT Frequency 2x / week   PT Duration 8 weeks   PT Treatment/Interventions Gait training;Functional mobility training;Therapeutic activities;Therapeutic exercise;Balance training;Patient/family education;Neuromuscular re-education;Manual techniques;Electrical Stimulation;Moist Heat;Energy conservation;Passive range of motion   PT Next Visit Plan Balance, endurance, strength progression   Consulted and Agree with Plan of Care --      Patient will benefit from skilled therapeutic intervention in order to  improve the following deficits and impairments:  Abnormal gait, Increased fascial restricitons, Decreased mobility, Postural dysfunction, Decreased activity tolerance, Decreased endurance, Decreased range of motion, Decreased strength, Decreased balance, Difficulty walking  Visit Diagnosis: Muscle weakness (generalized)  Other abnormalities of gait and mobility     Problem List Patient Active Problem List   Diagnosis Date Noted  . Acute blood loss anemia 09/27/2015  . Pelvic fracture (HCC) 09/21/2015  . Closed fracture of pelvis (HCC)   . Kyphoscoliosis 09/16/2013  . Mitral valve prolapse 05/24/2011  . Hypercholesterolemia 05/24/2011    COCHRAN,JENNIFER, PTA 06/05/2016, 2:41 PM  [facility] [address], [address] [place], [place], [address] Phone: [phone]   Fax:  [phone]  Name: Madison Odom MRN: [id number] Date of Birth: August 17, 1934

## 2016-06-07 ENCOUNTER — Ambulatory Visit: Payer: Medicare Other | Admitting: Physical Therapy

## 2016-06-07 ENCOUNTER — Encounter: Payer: Self-pay | Admitting: Physical Therapy

## 2016-06-07 DIAGNOSIS — M6281 Muscle weakness (generalized): Secondary | ICD-10-CM

## 2016-06-07 DIAGNOSIS — R2689 Other abnormalities of gait and mobility: Secondary | ICD-10-CM

## 2016-06-07 NOTE — Therapy (Signed)
[facility] [address] [place], [place], [address] Phone: [phone]   Fax:  [phone]  Physical Therapy Treatment  Patient Details  Name: Madison Odom MRN: [id number] Date of Birth: [birth year] Referring Provider: Dr. [name]  Encounter Date: 06/07/2016      PT End of Session - 06/07/16 1117    Visit Number 14   Number of Visits 20   Date for PT Re-Evaluation 06/19/16   Authorization Type medicare; g-codes at visit 20, KX at 15   PT Start Time 1055   PT Stop Time 1138   PT Time Calculation (min) 43 min   Activity Tolerance Patient tolerated treatment well   Behavior During Therapy [facility] for tasks assessed/performed      Past Medical History:  Diagnosis Date  . GERD (gastroesophageal reflux disease)   . Hyperlipidemia   . MVP (mitral valve prolapse)   . OP (osteoporosis)   . Pelvis fracture (HCC) 09/2015   CLOSED   . Scoliosis   . Spastic colon     Past Surgical History:  Procedure Laterality Date  . ABDOMINAL HYSTERECTOMY  1965  . APPENDECTOMY    . BILATERAL OOPHORECTOMY  1981  . EYE SURGERY    . ROTATOR CUFF REPAIR Right     There were no vitals filed for this visit.      Subjective Assessment - 06/07/16 1119    Subjective I felt fine after last session. Was able to incorporate side stepping exercise at home.    Currently in Pain? No/denies   Multiple Pain Sites No                         [facility] Adult PT Treatment/Exercise - 06/07/16 0001      Knee/Hip Exercises: Aerobic   Nustep L3 x 10 min  legs only     Knee/Hip Exercises: Standing   Heel Raises Both;2 sets;10 reps   Hip Abduction Stengthening;Both;2 sets;20 reps;Knee straight  2# added   Hip Extension Stengthening;Left;2 sets;20 reps;Knee straight  2#   Forward Step Up Both;2 sets;20 reps;Hand Hold: 2   Rebounder 3 way weight shift 1 min each way  using one UE only and higer knees   Other Standing  Knee Exercises marching with walking along the countertop4x   Other Standing Knee Exercises Side stepping at th countertop 4x   VC for posture and LE alignment     Knee/Hip Exercises: Seated   Long Arc Quad Strengthening;Both;3 sets;10 reps;Weights   Long Arc Quad Weight 2 lbs.   Ball Squeeze 50x                  PT Short Term Goals - 05/10/16 1427      PT SHORT TERM GOAL #2   Title berg balance score >/= 45/56    Time 4   Period Weeks   Status Achieved  55/56     PT SHORT TERM GOAL #3   Title improved confidence with walking outside increased >/= 25%   Time 4   Period Weeks   Status Achieved  25%           PT Long Term Goals - 06/05/16 1415      PT LONG TERM GOAL #1   Title independent with HEP   Time 8   Period Weeks   Status On-going     PT LONG TERM GOAL #3   Title improved confidence with  walking outside >/= 75% due to increased lower extremity strength   Time 8   Period Weeks   Status Achieved               Plan - 06/07/16 1118    Clinical Impression Statement Able to do more balance exercises at home this week, advancing HEP. Pt on track for DC next week.    Rehab Potential Good   Clinical Impairments Affecting Rehab Potential None   PT Duration 8 weeks   PT Treatment/Interventions Gait training;Functional mobility training;Therapeutic activities;Therapeutic exercise;Balance training;Patient/family education;Neuromuscular re-education;Manual techniques;Electrical Stimulation;Moist Heat;Energy conservation;Passive range of motion   PT Next Visit Plan KX on next visit. Probable DC next week to HEP. Measure height next week.    Consulted and Agree with Plan of Care Patient      Patient will benefit from skilled therapeutic intervention in order to improve the following deficits and impairments:  Abnormal gait, Increased fascial restricitons, Decreased mobility, Postural dysfunction, Decreased activity tolerance, Decreased endurance,  Decreased range of motion, Decreased strength, Decreased balance, Difficulty walking  Visit Diagnosis: Muscle weakness (generalized)  Other abnormalities of gait and mobility     Problem List Patient Active Problem List   Diagnosis Date Noted  . Acute blood loss anemia 09/27/2015  . Pelvic fracture (HCC) 09/21/2015  . Closed fracture of pelvis (HCC)   . Kyphoscoliosis 09/16/2013  . Mitral valve prolapse 05/24/2011  . Hypercholesterolemia 05/24/2011    Madison Odom, PTA 06/07/2016, 11:41 AM  [facility] [address] [place], [place], [address] Phone: [phone]   Fax:  [phone]  Name: Madison Odom MRN: [id number] Date of Birth: [birth year]

## 2016-06-12 ENCOUNTER — Ambulatory Visit: Payer: Medicare Other | Admitting: Physical Therapy

## 2016-06-12 ENCOUNTER — Encounter: Payer: Self-pay | Admitting: Physical Therapy

## 2016-06-12 DIAGNOSIS — R2689 Other abnormalities of gait and mobility: Secondary | ICD-10-CM

## 2016-06-12 DIAGNOSIS — M6281 Muscle weakness (generalized): Secondary | ICD-10-CM | POA: Diagnosis not present

## 2016-06-12 NOTE — Therapy (Signed)
[facility] [address] [place], [place], [address] Phone: [phone]   Fax:  [phone]  Physical Therapy Treatment  Patient Details  Name: Madison Odom MRN: [id number] Date of Birth: [birth year] Referring Provider: Dr. [name]  Encounter Date: 06/12/2016      PT End of Session - 06/12/16 1142    Visit Number 15   Number of Visits 20   Date for PT Re-Evaluation 06/19/16   Authorization Type medicare; g-codes at visit 20, KX at 15   PT Start Time 1100   PT Stop Time 1142   PT Time Calculation (min) 42 min   Activity Tolerance Patient tolerated treatment well   Behavior During Therapy [facility] for tasks assessed/performed      Past Medical History:  Diagnosis Date  . GERD (gastroesophageal reflux disease)   . Hyperlipidemia   . MVP (mitral valve prolapse)   . OP (osteoporosis)   . Pelvis fracture (HCC) 09/2015   CLOSED   . Scoliosis   . Spastic colon     Past Surgical History:  Procedure Laterality Date  . ABDOMINAL HYSTERECTOMY  1965  . APPENDECTOMY    . BILATERAL OOPHORECTOMY  1981  . EYE SURGERY    . ROTATOR CUFF REPAIR Right     There were no vitals filed for this visit.      Subjective Assessment - 06/12/16 1114    Subjective Everything is getting easier. My walking is getting better.  My daughter says I walk too fast. I feel like I need to be careful when walking outside and fear of falling.    Patient Stated Goals walk and not feel like she is going to fall.   Currently in Pain? No/denies   Multiple Pain Sites No                         [facility] Adult PT Treatment/Exercise - 06/12/16 0001      Ambulation/Gait   Ambulation/Gait Yes   Ambulation/Gait Assistance 6: Modified independent (Device/Increase time);5: Supervision   Ambulation Distance (Feet) 100 Feet   Assistive device Straight cane   Ambulation Surface Outdoor   Gait Comments patient needs verbal cues to  pick up her feet     Knee/Hip Exercises: Aerobic   Nustep L3 x 10 min  legs only     Knee/Hip Exercises: Standing   Heel Raises Both;2 sets;10 reps   Hip Abduction Stengthening;Both;2 sets;20 reps;Knee straight  2# added   Hip Extension Stengthening;Left;2 sets;20 reps;Knee straight  2#   Forward Step Up Both;2 sets;20 reps;Hand Hold: 2   Rebounder 3 way weight shift 1 min each way  only use finger tips   Other Standing Knee Exercises tandem walk along counter 10x holding on with 1 hand.    Other Standing Knee Exercises Side stepping at th countertop 4x   VC for posture and LE alignment; no hands     Knee/Hip Exercises: Seated   Long Arc Quad Strengthening;Both;10 reps;1 set   [employer] Weight 2 lbs.   Ball Squeeze 50x                PT Education - 06/12/16 1142    Education provided No          PT Short Term Goals - 05/10/16 1427      PT SHORT TERM GOAL #2   Title berg balance score >/= 45/56    Time 4  Period Weeks   Status Achieved  55/56     PT SHORT TERM GOAL #3   Title improved confidence with walking outside increased >/= 25%   Time 4   Period Weeks   Status Achieved  25%           PT Long Term Goals - 06/12/16 1116      PT LONG TERM GOAL #1   Title independent with HEP   Time 8   Period Weeks   Status On-going  still learning     PT LONG TERM GOAL #2   Title Berg balance score >/= 50/56   Time 8   Period Weeks   Status Achieved     PT LONG TERM GOAL #3   Title improved confidence with walking outside >/= 75% due to increased lower extremity strength   Time 8   Period Weeks   Status Achieved     PT LONG TERM GOAL #4   Title improved trunk strength  so she is able to stand >/= 4 feet 9 inches   Time 8   Period Weeks   Status On-going               Plan - 06/12/16 1142    Clinical Impression Statement Patient is having difficulty with confidence when she is walking outside.  Patient is standing taller.   Patient has improve balance.  Patient will benefit from skilled therapy on strength and balance.    Rehab Potential Good   Clinical Impairments Affecting Rehab Potential None   PT Frequency 2x / week   PT Duration 8 weeks   PT Treatment/Interventions Gait training;Functional mobility training;Therapeutic activities;Therapeutic exercise;Balance training;Patient/family education;Neuromuscular re-education;Manual techniques;Electrical Stimulation;Moist Heat;Energy conservation;Passive range of motion   PT Next Visit Plan KX on next visit. . Measure height next week. renewal to 1 time per week. new goals   PT Home Exercise Plan progress as needed   Consulted and Agree with Plan of Care Patient      Patient will benefit from skilled therapeutic intervention in order to improve the following deficits and impairments:  Abnormal gait, Increased fascial restricitons, Decreased mobility, Postural dysfunction, Decreased activity tolerance, Decreased endurance, Decreased range of motion, Decreased strength, Decreased balance, Difficulty walking  Visit Diagnosis: Muscle weakness (generalized)  Other abnormalities of gait and mobility     Problem List Patient Active Problem List   Diagnosis Date Noted  . Acute blood loss anemia 09/27/2015  . Pelvic fracture (HCC) 09/21/2015  . Closed fracture of pelvis (HCC)   . Kyphoscoliosis 09/16/2013  . Mitral valve prolapse 05/24/2011  . Hypercholesterolemia 05/24/2011    Eulis Foster, PT 06/12/16 11:45 AM    [facility] [address] [place], [place], [address] Phone: [phone]   Fax:  [phone]  Name: Madison Odom MRN: [id number] Date of Birth: February 01, 1934

## 2016-06-14 ENCOUNTER — Encounter: Payer: Self-pay | Admitting: Physical Therapy

## 2016-06-14 ENCOUNTER — Ambulatory Visit: Payer: Medicare Other | Attending: Adult Health | Admitting: Physical Therapy

## 2016-06-14 DIAGNOSIS — R2689 Other abnormalities of gait and mobility: Secondary | ICD-10-CM | POA: Insufficient documentation

## 2016-06-14 DIAGNOSIS — M6281 Muscle weakness (generalized): Secondary | ICD-10-CM | POA: Diagnosis present

## 2016-06-14 NOTE — Therapy (Signed)
[facility] [address], [address] [place], [place], [address] Phone: [phone]   Fax:  [phone]  Physical Therapy Treatment  Patient Details  Name: Madison Odom MRN: [id number] Date of Birth: 03/27/1934 Referring Provider: Dr. Cory Nafziger  Encounter Date: 06/14/2016      PT End of Session - 06/14/16 1215    Visit Number 16   Number of Visits 20   Date for PT Re-Evaluation 08/14/16   Authorization Type medicare; g-codes at visit 20, KX at 15   PT Start Time 1145   PT Stop Time 1235   PT Time Calculation (min) 50 min   Activity Tolerance Patient tolerated treatment well   Behavior During Therapy [facility] for tasks assessed/performed      Past Medical History:  Diagnosis Date  . GERD (gastroesophageal reflux disease)   . Hyperlipidemia   . MVP (mitral valve prolapse)   . OP (osteoporosis)   . Pelvis fracture (HCC) 09/2015   CLOSED   . Scoliosis   . Spastic colon     Past Surgical History:  Procedure Laterality Date  . ABDOMINAL HYSTERECTOMY  1965  . APPENDECTOMY    . BILATERAL OOPHORECTOMY  1981  . EYE SURGERY    . ROTATOR CUFF REPAIR Right     There were no vitals filed for this visit.      Subjective Assessment - 06/14/16 1157    Subjective I need to be careful to walk outside.  I do not have enough energy that I need to have.    Patient Stated Goals walk and not feel like she is going to fall.   Currently in Pain? No/denies            [facility] PT Assessment - 06/14/16 0001      Assessment   Medical Diagnosis R29.818 Balance disorder   Referring Provider Dr. Cory Nafziger   Onset Date/Surgical Date 11/14/15   Prior Therapy Yes, after last fall in 09/2015     Precautions   Precautions Other (comment)   Precaution Comments osteoporosis     Restrictions   Weight Bearing Restrictions No     Balance Screen   Has the patient fallen in the past 6 months No   Has the patient had a decrease in  activity level because of a fear of falling?  No   Is the patient reluctant to leave their home because of a fear of falling?  No     Home Environment   Living Environment Private residence     Prior Function   Level of Independence Independent     Cognition   Overall Cognitive Status Within Functional Limits for tasks assessed     Observation/Other Assessments   Focus on Therapeutic Outcomes (FOTO)  Berg balance score is 52/56 indicating  25% limitation     Posture/Postural Control   Posture Comments 4feet 9.5 inches     Strength   Right Hip Flexion 4/5   Right Hip Extension 3+/5   Right Hip ABduction 3+/5   Left Hip Flexion 4/5   Left Hip Extension 3+/5   Left Hip ABduction 3+/5   Right Knee Flexion 4/5   Right Knee Extension 5/5   Left Knee Flexion 4/5   Left Knee Extension 5/5   Right Ankle Plantar Flexion 4-/5   Right Ankle Inversion 4+/5   Right Ankle Eversion 4+/5   Left Ankle Plantar Flexion 4-/5   Left Ankle Inversion 4+/5   Left   Ankle Eversion 4+/5     Berg Balance Test   Sit to Stand Able to stand without using hands and stabilize independently   Standing Unsupported Able to stand safely 2 minutes   Sitting with Back Unsupported but Feet Supported on Floor or Stool Able to sit safely and securely 2 minutes   Stand to Sit Sits safely with minimal use of hands   Transfers Able to transfer safely, minor use of hands   Standing Unsupported with Eyes Closed Able to stand 10 seconds safely   Standing Ubsupported with Feet Together Able to place feet together independently and stand 1 minute safely   From Standing, Reach Forward with Outstretched Arm Can reach confidently >25 cm (10")   From Standing Position, Pick up Object from Floor Able to pick up shoe safely and easily   From Standing Position, Turn to Look Behind Over each Shoulder Looks behind from both sides and weight shifts well   Turn 360 Degrees Able to turn 360 degrees safely one side only in 4 seconds  or less   Standing Unsupported, Alternately Place Feet on Step/Stool Able to stand independently and safely and complete 8 steps in 20 seconds   Standing Unsupported, One Foot in Front Able to place foot tandem independently and hold 30 seconds   Standing on One Leg Able to lift leg independently and hold equal to or more than 3 seconds   Total Score 53   Berg comment: 25% chance of falling                     [facility] Adult PT Treatment/Exercise - 06/14/16 0001      Ambulation/Gait   Ambulation/Gait Yes   Ambulation/Gait Assistance 6: Modified independent (Device/Increase time);5: Supervision   Ambulation Distance (Feet) 100 Feet   Assistive device Straight cane   Ambulation Surface Outdoor     Knee/Hip Exercises: Aerobic   Nustep L3 x 10 min  legs only     Knee/Hip Exercises: Standing   Hip Abduction Stengthening;Both;20 reps;Knee straight;1 set  2# added   Hip Extension Stengthening;Left;20 reps;Knee straight;1 set  2#   Rebounder 3 way weight shift 1 min each way  only use finger tips     Knee/Hip Exercises: Seated   Long Arc Quad Strengthening;Both;10 reps;1 set   Long Arc Quad Weight 2 lbs.                PT Education - 06/14/16 1211    Education provided No          PT Short Term Goals - 06/14/16 1158      PT SHORT TERM GOAL #1   Title indepedent with initial HEP   Time 4   Period Weeks   Status Achieved     PT SHORT TERM GOAL #2   Title berg balance score >/= 45/56    Time 4   Period Weeks   Status Achieved     PT SHORT TERM GOAL #3   Title improved confidence with walking outside increased >/= 25%   Time 4   Period Weeks   Status Achieved           PT Long Term Goals - 06/14/16 1158      PT LONG TERM GOAL #1   Title independent with HEP   Time 8   Period Weeks   Status On-going     PT LONG TERM GOAL #2   Title Berg balance score >/= 50/56     Time 8   Period Weeks   Status Achieved     PT LONG TERM GOAL #3    Title improved confidence with walking outside >/= 75% due to increased lower extremity strength   Time 8   Period Weeks   Status Achieved     PT LONG TERM GOAL #4   Title improved trunk strength  so she is able to stand >/= 4 feet 9 inches   Time 8   Period Weeks   Status Achieved     PT LONG TERM GOAL #5   Title go to the grocery store and shop while holding onto a basket by herself due to [employer] >/= 55/56   Time 8   Period Weeks   Status New     Additional Long Term Goals   Additional Long Term Goals Yes     PT LONG TERM GOAL #6   Title pick up a grocery bag and not feel like she is going to loose her balance due to increased strength in legs   Time 8   Period Weeks   Status New     PT LONG TERM GOAL #7   Title walk outside on unlevel surface with spc with increased confidence >/= 90% due to improved balance and strength    Time 8   Period Weeks               Plan - 06/14/16 1215    Clinical Impression Statement Patient has met her prior goals but is not safe with walking on unlevel surfaces outside due to fear of falling.  Patient does not go to the grocery store by herself due to fear of falling.  Patient berg balance score is 52/56 indicating 25% chance of falling. Patient feels like she is going to fall when she picks up a grocery bag due to hip and trunk weakness.  Patient will benefit from physical therapy to improve strength and balance.    Rehab Potential Good   Clinical Impairments Affecting Rehab Potential None   PT Frequency 1x / week   PT Duration 8 weeks   PT Treatment/Interventions Gait training;Functional mobility training;Therapeutic activities;Therapeutic exercise;Balance training;Patient/family education;Neuromuscular re-education;Manual techniques;Electrical Stimulation;Moist Heat;Energy conservation;Passive range of motion   PT Next Visit Plan KX on next visit. work on walking on unlevel surface, ankle and hip strength, walking and changing  directions. standing and lifting things to not lose balance   PT Home Exercise Plan progress as needed   Consulted and Agree with Plan of Care Patient      Patient will benefit from skilled therapeutic intervention in order to improve the following deficits and impairments:  Abnormal gait, Increased fascial restricitons, Decreased mobility, Postural dysfunction, Decreased activity tolerance, Decreased endurance, Decreased range of motion, Decreased strength, Decreased balance, Difficulty walking  Visit Diagnosis: Muscle weakness (generalized) - Plan: PT plan of care cert/re-cert     Problem List Patient Active Problem List   Diagnosis Date Noted  . Acute blood loss anemia 09/27/2015  . Pelvic fracture ([place]) 09/21/2015  . Closed fracture of pelvis ([place])   . Kyphoscoliosis 09/16/2013  . Mitral valve prolapse 05/24/2011  . Hypercholesterolemia 05/24/2011    Earlie Counts, PT 06/14/16 12:29 PM   [facility] [address] [place], [place], [address] Phone: [phone]   Fax:  [phone]  Name: LAKEITHA BASQUES MRN: [id number] Date of Birth: 1934/01/19

## 2016-06-22 ENCOUNTER — Encounter: Payer: Self-pay | Admitting: Physical Therapy

## 2016-06-22 ENCOUNTER — Ambulatory Visit: Payer: Medicare Other | Admitting: Physical Therapy

## 2016-06-22 DIAGNOSIS — R2689 Other abnormalities of gait and mobility: Secondary | ICD-10-CM

## 2016-06-22 DIAGNOSIS — M6281 Muscle weakness (generalized): Secondary | ICD-10-CM

## 2016-06-22 NOTE — Therapy (Signed)
[facility] [address] [place], [place], [address] Phone: [phone]   Fax:  [phone]  Physical Therapy Treatment  Patient Details  Name: Madison Odom MRN: [id number] Date of Birth: 06/19/1934 Referring Provider: Dr. [name]  Encounter Date: 06/22/2016      PT End of Session - 06/22/16 1151    Visit Number 17   Number of Visits 20   Date for PT Re-Evaluation 08/14/16   Authorization Type medicare; g-codes at visit 20, KX at 15   PT Start Time 1143   PT Stop Time 1223   PT Time Calculation (min) 40 min   Activity Tolerance Patient tolerated treatment well   Behavior During Therapy [facility] for tasks assessed/performed      Past Medical History:  Diagnosis Date  . GERD (gastroesophageal reflux disease)   . Hyperlipidemia   . MVP (mitral valve prolapse)   . OP (osteoporosis)   . Pelvis fracture (HCC) 09/2015   CLOSED   . Scoliosis   . Spastic colon     Past Surgical History:  Procedure Laterality Date  . ABDOMINAL HYSTERECTOMY  1965  . APPENDECTOMY    . BILATERAL OOPHORECTOMY  1981  . EYE SURGERY    . ROTATOR CUFF REPAIR Right     There were no vitals filed for this visit.      Subjective Assessment - 06/22/16 1151    Subjective I have trouble opening a door with my cane.    Patient Stated Goals walk and not feel like she is going to fall.   Currently in Pain? No/denies                         [facility] Adult PT Treatment/Exercise - 06/22/16 0001      Ambulation/Gait   Ambulation/Gait Yes   Ambulation/Gait Assistance 6: Modified independent (Device/Increase time);5: Supervision   Ambulation Distance (Feet) 250 Feet   Assistive device Straight cane   Ambulation Surface Outdoor;Paved   Curb 5: Supervision  6x; vc to clear feet   Gait Comments patient needed someone close by     Knee/Hip Exercises: Aerobic   Nustep L3 x 10 min  legs only     Knee/Hip Exercises:  Standing   Hip Abduction Stengthening;Both;20 reps;Knee straight;1 set  2# added   Hip Extension Stengthening;Left;20 reps;Knee straight;1 set  2#   Functional Squat 5 reps  lifting up yellow plyoball off mat   Functional Squat Limitations vc to bend at knees not at waist     Knee/Hip Exercises: Seated   Long Arc Quad Strengthening;Both;20 reps   Long Arc Quad Weight 2 lbs.   Ball Squeeze 50x     Shoulder Exercises: [employer] consultant stand and push pulley forward on 10# 10x                PT Education - 06/22/16 1224    Education provided No          PT Short Term Goals - 06/14/16 1158      PT SHORT TERM GOAL #1   Title indepedent with initial HEP   Time 4   Period Weeks   Status Achieved     PT SHORT TERM GOAL #2   Title berg balance score >/= 45/56    Time 4   Period Weeks   Status Achieved     PT SHORT TERM GOAL #3  Title improved confidence with walking outside increased >/= 25%   Time 4   Period Weeks   Status Achieved           PT Long Term Goals - 06/22/16 1159      PT LONG TERM GOAL #1   Title independent with HEP   Time 8   Status On-going     PT LONG TERM GOAL #2   Title Berg balance score >/= 50/56   Time 8   Period Weeks   Status Achieved     PT LONG TERM GOAL #3   Title improved confidence with walking outside >/= 75% due to increased lower extremity strength   Time 8   Period Weeks   Status Achieved     PT LONG TERM GOAL #4   Title improved trunk strength  so she is able to stand >/= 4 feet 9 inches   Time 8   Period Weeks   Status Achieved     PT LONG TERM GOAL #5   Title go to the grocery store and shop while holding onto a basket by herself due to [employer] >/= 55/56   Time 8   Period Weeks   Status On-going     PT LONG TERM GOAL #6   Title pick up a grocery bag and not feel like she is going to loose her balance due to increased strength in legs   Time 8   Period Weeks   Status  On-going     PT LONG TERM GOAL #7   Title walk outside on unlevel surface with spc with increased confidence >/= 90% due to improved balance and strength    Period Weeks   Status On-going  walked to mailbox on wet surface with increased confidence               Plan - 06/22/16 1201    Clinical Impression Statement Patient was able to walk to the mailbox on wet surface with greater confidence.  Patient is tired today.  Patient has difficulty with pushing a weight forward facilitating opening a door in standing due to trunk weakness.  Patient needs verbal cues to squat with her hips and knees to pick an item up intstead of bending at her back. Patient exercises with increased endurance.    Rehab Potential Good   PT Frequency 1x / week   PT Duration 8 weeks   PT Treatment/Interventions Gait training;Functional mobility training;Therapeutic activities;Therapeutic exercise;Balance training;Patient/family education;Neuromuscular re-education;Manual techniques;Electrical Stimulation;Moist Heat;Energy conservation;Passive range of motion   PT Next Visit Plan KX on  visit. work on walking on unlevel surface, ankle and hip strength, walking and changing directions. standing and lifting things to not lose balance   PT Home Exercise Plan progress as needed   Consulted and Agree with Plan of Care Patient      Patient will benefit from skilled therapeutic intervention in order to improve the following deficits and impairments:  Abnormal gait, Increased fascial restricitons, Decreased mobility, Postural dysfunction, Decreased activity tolerance, Decreased endurance, Decreased range of motion, Decreased strength, Decreased balance, Difficulty walking  Visit Diagnosis: Muscle weakness (generalized)  Other abnormalities of gait and mobility     Problem List Patient Active Problem List   Diagnosis Date Noted  . Acute blood loss anemia 09/27/2015  . Pelvic fracture (HCC) 09/21/2015  . Closed  fracture of pelvis (HCC)   . Kyphoscoliosis 09/16/2013  . Mitral valve prolapse 05/24/2011  . Hypercholesterolemia 05/24/2011  Eulis FosterCheryl Nilda Keathley, PT 06/22/16 12:27 PM    [facility] [address], [address] [place], [place], [address] Phone: [phone]   Fax:  [phone]  Name: Madison Odom MRN: [id number] Date of Birth: [birth year]

## 2016-06-28 ENCOUNTER — Ambulatory Visit: Payer: Medicare Other | Admitting: Physical Therapy

## 2016-06-28 ENCOUNTER — Encounter: Payer: Self-pay | Admitting: Physical Therapy

## 2016-06-28 DIAGNOSIS — M6281 Muscle weakness (generalized): Secondary | ICD-10-CM

## 2016-06-28 NOTE — Therapy (Signed)
[facility] [address], [address] [place], [place], [address] Phone: [phone]   Fax:  [phone]  Physical Therapy Treatment  Patient Details  Name: Madison Odom MRN: [id number] Date of Birth: 03-18-1934 Referring Provider: Dr. Shirline Freesory Nafziger  Encounter Date: 06/28/2016      PT End of Session - 06/28/16 1152    Visit Number 18   Number of Visits 20   Date for PT Re-Evaluation 08/14/16   Authorization Type medicare; g-codes at visit 20, KX at 15   PT Start Time 1146   PT Stop Time 1220   PT Time Calculation (min) 34 min   Activity Tolerance Patient tolerated treatment well   Behavior [facility] for tasks assessed/performed      Past Medical History:  Diagnosis Date  . GERD (gastroesophageal reflux disease)   . Hyperlipidemia   . MVP (mitral valve prolapse)   . OP (osteoporosis)   . Pelvis fracture (HCC) 09/2015   CLOSED   . Scoliosis   . Spastic colon     Past Surgical History:  Procedure Laterality Date  . ABDOMINAL HYSTERECTOMY  1965  . APPENDECTOMY    . BILATERAL OOPHORECTOMY  1981  . EYE SURGERY    . ROTATOR CUFF REPAIR Right     There were no vitals filed for this visit.      Subjective Assessment - 06/28/16 1151    Subjective I bought the 2# weights and sometimes have a hard time gettin gthem on. I went to the bank & [employer] independently this week. Felt confident.    Currently in Pain? No/denies                         [facility] Adult PT Treatment/Exercise - 06/28/16 0001      High Level Balance   High Level Balance Activities Side stepping;Head turns;Marching forwards  Carried cup of water around the clinic 80 feet with cane     Self-Care   Self-Care --  Practiced getting the weights on her ankles and off 5x indep     Knee/Hip Exercises: Aerobic   Nustep L3 x 12 min     Knee/Hip Exercises: Seated   Long Arc Quad Strengthening;Both;20 reps   Long Arc Quad Weight 2  lbs.   Knee/Hip Flexion 2# bil 30x                  PT Short Term Goals - 06/14/16 1158      PT SHORT TERM GOAL #1   Title indepedent with initial HEP   Time 4   Period Weeks   Status Achieved     PT SHORT TERM GOAL #2   Title berg balance score >/= 45/56    Time 4   Period Weeks   Status Achieved     PT SHORT TERM GOAL #3   Title improved confidence with walking outside increased >/= 25%   Time 4   Period Weeks   Status Achieved           PT Long Term Goals - 06/28/16 1211      PT LONG TERM GOAL #1   Title independent with HEP   Time 8   Period Weeks   Status On-going  Purchased weights for home to do the standing & sittin gexercises     PT LONG TERM GOAL #5   Title go to the grocery store and shop while holding onto  a basket by herself due to Berg balance >/= 55/56   Time 8   Period Weeks   Status Achieved               Plan - 06/28/16 1152    Clinical Impression Statement Pt reports being able to go to [employer] and the bank independently this week with reported high levels of confidence. We simulated balance exercises that ivloved scanning around and other functional actiites she may do duriing th e day. Increased time on the Nustep to work on leg endurance.   Rehab Potential Good   Clinical Impairments Affecting Rehab Potential None   PT Frequency 1x / week   PT Duration 8 weeks   PT Treatment/Interventions Gait training;Functional mobility training;Therapeutic activities;Therapeutic exercise;Balance training;Patient/family education;Neuromuscular re-education;Manual techniques;Electrical Stimulation;Moist Heat;Energy conservation;Passive range of motion   PT Next Visit Plan Lifting light items, walk outside if not too hot, balance with head turns   Consulted and Agree with Plan of Care --      Patient will benefit from skilled therapeutic intervention in order to improve the following deficits and impairments:  Abnormal gait, Increased  fascial restricitons, Decreased mobility, Postural dysfunction, Decreased activity tolerance, Decreased endurance, Decreased range of motion, Decreased strength, Decreased balance, Difficulty walking  Visit Diagnosis: Muscle weakness (generalized)     Problem List Patient Active Problem List   Diagnosis Date Noted  . Acute blood loss anemia 09/27/2015  . Pelvic fracture (HCC) 09/21/2015  . Closed fracture of pelvis (HCC)   . Kyphoscoliosis 09/16/2013  . Mitral valve prolapse 05/24/2011  . Hypercholesterolemia 05/24/2011    COCHRAN,JENNIFER, PTA 06/28/2016, 12:15 PM  [facility] [address], [address] [place], [place], [address] Phone: [phone]   Fax:  [phone]  Name: Madison Odom MRN: [id number] Date of Birth: May 04, 1934

## 2016-07-05 ENCOUNTER — Ambulatory Visit: Payer: Medicare Other | Admitting: Physical Therapy

## 2016-07-05 ENCOUNTER — Encounter: Payer: Self-pay | Admitting: Physical Therapy

## 2016-07-05 DIAGNOSIS — R2689 Other abnormalities of gait and mobility: Secondary | ICD-10-CM

## 2016-07-05 DIAGNOSIS — M6281 Muscle weakness (generalized): Secondary | ICD-10-CM

## 2016-07-05 NOTE — Therapy (Signed)
[facility] [address] [place], [place], [address] Phone: [phone]   Fax:  [phone]  Physical Therapy Treatment  Patient Details  Name: Madison Odom MRN: [id number] Date of Birth: 05/12/1934 Referring Provider: Dr. Shirline Freesory Nafziger  Encounter Date: 07/05/2016      PT End of Session - 07/05/16 1104    Visit Number 19   Number of Visits 20   Date for PT Re-Evaluation 08/14/16   Authorization Type medicare; g-codes at visit 20, KX at 15   PT Start Time 1100   PT Stop Time 1138   PT Time Calculation (min) 38 min   Activity Tolerance Patient tolerated treatment well   Behavior During Therapy [facility] for tasks assessed/performed      Past Medical History:  Diagnosis Date  . GERD (gastroesophageal reflux disease)   . Hyperlipidemia   . MVP (mitral valve prolapse)   . OP (osteoporosis)   . Pelvis fracture (HCC) 09/2015   CLOSED   . Scoliosis   . Spastic colon     Past Surgical History:  Procedure Laterality Date  . ABDOMINAL HYSTERECTOMY  1965  . APPENDECTOMY    . BILATERAL OOPHORECTOMY  1981  . EYE SURGERY    . ROTATOR CUFF REPAIR Right     There were no vitals filed for this visit.      Subjective Assessment - 07/05/16 1103    Subjective I am getting my weights on better now, thicker socks are helping. Pt also reports it is easier to stand up straighter and with less sidebend.    Currently in Pain? No/denies   Multiple Pain Sites No                         [facility] Adult PT Treatment/Exercise - 07/05/16 0001      Ambulation/Gait   Ambulation/Gait Yes   Ambulation/Gait Assistance 5: Supervision   Ambulation Distance (Feet) 250 Feet   Assistive device Straight cane   Ambulation Surface Unlevel;Outdoor;Paved;Grass   Curb 5: Supervision     High Level Balance   High Level Balance Activities Sudden stops;Head turns;Tandem walking;Marching forwards   High Level Balance Comments 4  lengths of each with SBA     Knee/Hip Exercises: Aerobic   Nustep L3 x 13 min                  PT Short Term Goals - 06/14/16 1158      PT SHORT TERM GOAL #1   Title indepedent with initial HEP   Time 4   Period Weeks   Status Achieved     PT SHORT TERM GOAL #2   Title berg balance score >/= 45/56    Time 4   Period Weeks   Status Achieved     PT SHORT TERM GOAL #3   Title improved confidence with walking outside increased >/= 25%   Time 4   Period Weeks   Status Achieved           PT Long Term Goals - 07/05/16 1106      PT LONG TERM GOAL #1   Title independent with HEP   Time 8   Period Weeks   Status On-going     PT LONG TERM GOAL #6   Title pick up a grocery bag and not feel like she is going to loose her balance due to increased strength in legs   Time 8  Period Weeks   Status On-going  Pt reports that her daughter typically helps with this so she has not done this.      PT LONG TERM GOAL #7   Title walk outside on unlevel surface with spc with increased confidence >/= 90% due to improved balance and strength    Time 8   Period Weeks   Status On-going  75%-80%               Plan - 07/05/16 1105    Clinical Impression Statement Pt standing more erect and more in her midline. She feels almost 90% more confidence ambulating outdoors per report today.   Rehab Potential Good   Clinical Impairments Affecting Rehab Potential None   PT Frequency 1x / week   PT Duration 8 weeks   PT Treatment/Interventions Gait training;Functional mobility training;Therapeutic activities;Therapeutic exercise;Balance training;Patient/family education;Neuromuscular re-education;Manual techniques;Electrical Stimulation;Moist Heat;Energy conservation;Passive range of motion   PT Next Visit Plan Lifting light items   Consulted and Agree with Plan of Care Patient      Patient will benefit from skilled therapeutic intervention in order to improve the following  deficits and impairments:  Abnormal gait, Increased fascial restricitons, Decreased mobility, Postural dysfunction, Decreased activity tolerance, Decreased endurance, Decreased range of motion, Decreased strength, Decreased balance, Difficulty walking  Visit Diagnosis: Muscle weakness (generalized)  Other abnormalities of gait and mobility     Problem List Patient Active Problem List   Diagnosis Date Noted  . Acute blood loss anemia 09/27/2015  . Pelvic fracture (HCC) 09/21/2015  . Closed fracture of pelvis (HCC)   . Kyphoscoliosis 09/16/2013  . Mitral valve prolapse 05/24/2011  . Hypercholesterolemia 05/24/2011    Ferris Fielden, PTA 07/05/2016, 11:41 AM   [facility] [address] [place], [place], [address] Phone: [phone]   Fax:  [phone]  Name: Madison Odom MRN: [id number] Date of Birth: 06/19/1934

## 2016-07-12 ENCOUNTER — Ambulatory Visit: Payer: Medicare Other | Admitting: Physical Therapy

## 2016-07-12 ENCOUNTER — Encounter: Payer: Self-pay | Admitting: Physical Therapy

## 2016-07-12 DIAGNOSIS — R2689 Other abnormalities of gait and mobility: Secondary | ICD-10-CM

## 2016-07-12 DIAGNOSIS — M6281 Muscle weakness (generalized): Secondary | ICD-10-CM

## 2016-07-12 NOTE — Therapy (Signed)
[facility] [address], [address] [place], [place], [address] Phone: [phone]   Fax:  [phone]  Physical Therapy Treatment  Patient Details  Name: Madison Odom A Gut MRN: [id number] Date of Birth: 10/14/1934 Referring Provider: Dr. Shirline Freesory Nafziger  Encounter Date: 07/12/2016    Past Medical History:  Diagnosis Date  . GERD (gastroesophageal reflux disease)   . Hyperlipidemia   . MVP (mitral valve prolapse)   . OP (osteoporosis)   . Pelvis fracture (HCC) 09/2015   CLOSED   . Scoliosis   . Spastic colon     Past Surgical History:  Procedure Laterality Date  . ABDOMINAL HYSTERECTOMY  1965  . APPENDECTOMY    . BILATERAL OOPHORECTOMY  1981  . EYE SURGERY    . ROTATOR CUFF REPAIR Right     There were no vitals filed for this visit.      Subjective Assessment - 07/12/16 1154    Subjective Getting easier to stand up straight in the last week. Walking outside is easier. When pick up grocery bag I feel steadier. Patient does not use the cane in the house.    Patient Stated Goals walk and not feel like she is going to fall.   Currently in Pain? No/denies            [facility] PT Assessment - 07/12/16 0001      Assessment   Medical Diagnosis R29.818 Balance disorder   Referring Provider Dr. Shirline Freesory Nafziger   Onset Date/Surgical Date 11/14/15     Precautions   Precautions Other (comment)   Precaution Comments osteoporosis     Restrictions   Weight Bearing Restrictions No     Balance Screen   Has the patient fallen in the past 6 months No   Has the patient had a decrease in activity level because of a fear of falling?  No   Is the patient reluctant to leave their home because of a fear of falling?  No     Home [employer]ist information centre managernvironment   Living Environment Private residence     Cognition   Overall Cognitive Status Within Functional Limits for tasks assessed     Strength   Right Hip Flexion 4+/5   Right Hip Extension 4-/5    Right Hip ABduction 4/5   Left Hip Flexion 4/5   Left Hip Extension 4-/5   Left Hip ABduction 4/5   Right Knee Flexion 5/5   Right Knee Extension 5/5   Left Knee Flexion 4+/5   Left Knee Extension 5/5   Right Ankle Plantar Flexion 4-/5   Right Ankle Inversion 4+/5   Right Ankle Eversion 4+/5   Left Ankle Plantar Flexion 4-/5   Left Ankle Inversion 5/5   Left Ankle Eversion 5/5     High Level Balance   High Level Balance Activities Sudden stops;Head turns;Tandem walking;Marching forwards     Berg Balance Test   Sit to Stand Able to stand without using hands and stabilize independently   Standing Unsupported Able to stand safely 2 minutes   Sitting with Back Unsupported but Feet Supported on Floor or Stool Able to sit safely and securely 2 minutes   Stand to Sit Sits safely with minimal use of hands   Transfers Able to transfer safely, minor use of hands   Standing Unsupported with Eyes Closed Able to stand 10 seconds safely   Standing Ubsupported with Feet Together Able to place feet together independently and stand 1 minute safely  From Standing, Reach Forward with Outstretched Arm Can reach confidently >25 cm (10")   From Standing Position, Pick up Object from Floor Able to pick up shoe safely and easily   From Standing Position, Turn to Look Behind Over each Shoulder Looks behind from both sides and weight shifts well   Turn 360 Degrees Able to turn 360 degrees safely in 4 seconds or less   Standing Unsupported, Alternately Place Feet on Step/Stool Able to stand independently and safely and complete 8 steps in 20 seconds   Standing Unsupported, One Foot in Front Able to place foot tandem independently and hold 30 seconds   Standing on One Leg Able to lift leg independently and hold equal to or more than 3 seconds   Total Score 54   Berg comment: 25% chance of falling, trouble with 1 legged stance                     [facility] Adult PT Treatment/Exercise - 07/12/16  0001      Ambulation/Gait   Ambulation/Gait Yes   Ambulation/Gait Assistance 5: Supervision   Ambulation/Gait Assistance Details walked slowy, supervision on grass, fatique at end;   Ambulation Distance (Feet) 250 Feet   Assistive device Straight cane   Ambulation Surface Unlevel;Outdoor;Paved;Other (comment)  walked on the lawn; walked up and down incline                  PT Short Term Goals - 06/14/16 1158      PT SHORT TERM GOAL #1   Title indepedent with initial HEP   Time 4   Period Weeks   Status Achieved     PT SHORT TERM GOAL #2   Title berg balance score >/= 45/56    Time 4   Period Weeks   Status Achieved     PT SHORT TERM GOAL #3   Title improved confidence with walking outside increased >/= 25%   Time 4   Period Weeks   Status Achieved           PT Long Term Goals - 07/12/16 1156      PT LONG TERM GOAL #1   Title independent with HEP   Time 8   Period Weeks   Status On-going     PT LONG TERM GOAL #2   Title Berg balance score >/= 50/56   Time 8   Period Weeks   Status Achieved     PT LONG TERM GOAL #3   Title improved confidence with walking outside >/= 75% due to increased lower extremity strength   Time 8   Period Weeks   Status Achieved     PT LONG TERM GOAL #4   Title improved trunk strength  so she is able to stand >/= 4 feet 9 inches   Time 8   Period Weeks   Status Achieved     PT LONG TERM GOAL #5   Title go to the grocery store and shop while holding onto a basket by herself due to [employer] >/= 55/56   Time 8   Period Weeks   Status Achieved     PT LONG TERM GOAL #6   Title pick up a grocery bag and not feel like she is going to loose her balance due to increased strength in legs   Time 8   Period Weeks   Status On-going  daughter has helped her so has not done it.  PT LONG TERM GOAL #7   Title walk outside on unlevel surface with spc with increased confidence >/= 90% due to improved balance and  strength    Time 8   Period Weeks   Status On-going  80% better               Plan - 06-Aug-2016 1220    Clinical Impression Statement Patient has Sharlene Motts balance score is 54/56 with difficulty on standing on one leg. Patient confidence of walking on unlevel ground is 80% better.  During Tandem walk she has steadier hips.  Patient needs to ick up her feet high when walking on the grass.  Patient was fatiqued after walking outside 250 feet.  Patient has increased bil. lower extremity strength. Patient will benefit from skilled therapy to improve balance on unlevel surface.    Rehab Potential Good   Clinical Impairments Affecting Rehab Potential None   PT Frequency 1x / week   PT Duration 8 weeks   PT Treatment/Interventions Gait training;Functional mobility training;Therapeutic activities;Therapeutic exercise;Balance training;Patient/family education;Neuromuscular re-education;Manual techniques;Electrical Stimulation;Moist Heat;Energy conservation;Passive range of motion   PT Next Visit Plan Lifting light items; patient has 2 more visit then will be discharged; walking on the lawn; go over HEP to be ready for discharge   PT Home Exercise Plan progress as needed   Consulted and Agree with Plan of Care Patient      Patient will benefit from skilled therapeutic intervention in order to improve the following deficits and impairments:  Abnormal gait, Increased fascial restricitons, Decreased mobility, Postural dysfunction, Decreased activity tolerance, Decreased endurance, Decreased range of motion, Decreased strength, Decreased balance, Difficulty walking  Visit Diagnosis: Muscle weakness (generalized)  Other abnormalities of gait and mobility       G-Codes - 2016/08/06 1218    Functional Assessment Tool Used BERG balance is now 54/56: goal was 49/56.   Functional Limitation Mobility: Walking and moving around   Mobility: Walking and Moving Around Current Status [phone]) At least 1 percent  but less than 20 percent impaired, limited or restricted   Mobility: Walking and Moving Around Goal Status [phone]) At least 1 percent but less than 20 percent impaired, limited or restricted      Problem List Patient Active Problem List   Diagnosis Date Noted  . Acute blood loss anemia 09/27/2015  . Pelvic fracture (HCC) 09/21/2015  . Closed fracture of pelvis (HCC)   . Kyphoscoliosis 09/16/2013  . Mitral valve prolapse 05/24/2011  . Hypercholesterolemia 05/24/2011    Eulis Foster, PT 08/06/16 12:27 PM   [facility] [address] [place], [place], [address] Phone: [phone]   Fax:  [phone]  Name: RAMONICA GRIGG MRN: [id number] Date of Birth: 23-Aug-1934

## 2016-07-19 ENCOUNTER — Encounter: Payer: Self-pay | Admitting: Physical Therapy

## 2016-07-19 ENCOUNTER — Ambulatory Visit: Payer: Medicare Other | Attending: Adult Health | Admitting: Physical Therapy

## 2016-07-19 DIAGNOSIS — M6281 Muscle weakness (generalized): Secondary | ICD-10-CM | POA: Diagnosis not present

## 2016-07-19 DIAGNOSIS — R2689 Other abnormalities of gait and mobility: Secondary | ICD-10-CM

## 2016-07-19 NOTE — Therapy (Signed)
[facility] [address] [place], [place], [address] Phone: [phone]   Fax:  [phone]  Physical Therapy Treatment  Patient Details  Name: PALAK TERCERO MRN: [id number] Date of Birth: 1934-08-08 Referring Provider: Dr. [name]  Encounter Date: 07/19/2016      PT End of Session - 07/19/16 1059    Visit Number 20   Number of Visits 20   Date for PT Re-Evaluation 08/14/16   Authorization Type medicare; g-codes at visit 20, KX at 15   PT Start Time 1059   PT Stop Time 1135   PT Time Calculation (min) 36 min   Activity Tolerance Patient tolerated treatment well   Behavior During Therapy [facility] for tasks assessed/performed      Past Medical History:  Diagnosis Date  . GERD (gastroesophageal reflux disease)   . Hyperlipidemia   . MVP (mitral valve prolapse)   . OP (osteoporosis)   . Pelvis fracture (HCC) 09/2015   CLOSED   . Scoliosis   . Spastic colon     Past Surgical History:  Procedure Laterality Date  . ABDOMINAL HYSTERECTOMY  1965  . APPENDECTOMY    . BILATERAL OOPHORECTOMY  1981  . EYE SURGERY    . ROTATOR CUFF REPAIR Right     There were no vitals filed for this visit.      Subjective Assessment - 07/19/16 1108    Subjective It is raining out today so I might not want to walk outside today.   Currently in Pain? No/denies                         [facility] Adult PT Treatment/Exercise - 07/19/16 0001      Ambulation/Gait   Ambulation/Gait Yes   Ambulation/Gait Assistance 5: Supervision   Assistive device Straight cane   Ambulation Surface --  Lawn     Therapeutic Activites    Therapeutic Activities --  Lift and carry grocery bag with 1# in it. Carry 4x     Knee/Hip Exercises: Aerobic   Nustep L3 x 13 min                  PT Short Term Goals - 06/14/16 1158      PT SHORT TERM GOAL #1   Title indepedent with initial HEP   Time 4   Period Weeks   Status Achieved     PT SHORT TERM GOAL #2   Title berg balance score >/= 45/56    Time 4   Period Weeks   Status Achieved     PT SHORT TERM GOAL #3   Title improved confidence with walking outside increased >/= 25%   Time 4   Period Weeks   Status Achieved           PT Long Term Goals - 07/19/16 1109      PT LONG TERM GOAL #1   Title independent with HEP   Time 8   Period Weeks   Status On-going               Plan - 07/19/16 1109    Clinical Impression Statement Pt demonstrated good balance outside in the grass today.  Grass was fairly thick today. Pt was able to demonstrate the ability to lift a grocery bag with 1# in it and carry it 80 feet 4x self correcting her posture each time.    Rehab Potential Good  Clinical Impairments Affecting Rehab Potential None   PT Frequency 1x / week   PT Duration 8 weeks   PT Treatment/Interventions Gait training;Functional mobility training;Therapeutic activities;Therapeutic exercise;Balance training;Patient/family education;Neuromuscular re-education;Manual techniques;Electrical Stimulation;Moist Heat;Energy conservation;Passive range of motion   PT Next Visit Plan Discharge next   Consulted and Agree with Plan of Care Patient      Patient will benefit from skilled therapeutic intervention in order to improve the following deficits and impairments:  Abnormal gait, Increased fascial restricitons, Decreased mobility, Postural dysfunction, Decreased activity tolerance, Decreased endurance, Decreased range of motion, Decreased strength, Decreased balance, Difficulty walking  Visit Diagnosis: Muscle weakness (generalized)  Other abnormalities of gait and mobility     Problem List Patient Active Problem List   Diagnosis Date Noted  . Acute blood loss anemia 09/27/2015  . Pelvic fracture (HCC) 09/21/2015  . Closed fracture of pelvis (HCC)   . Kyphoscoliosis 09/16/2013  . Mitral valve prolapse 05/24/2011  .  Hypercholesterolemia 05/24/2011    Britany Callicott, PTA 07/19/2016, 11:34 AM  [facility] [address], [address] [place], [place], [address] Phone: [phone]   Fax:  [phone]  Name: Cheryl Flashatsy A Cajamarca MRN: [id number] Date of Birth: 02-17-1934

## 2016-07-26 ENCOUNTER — Ambulatory Visit: Payer: Medicare Other | Admitting: Physical Therapy

## 2016-07-26 DIAGNOSIS — R2689 Other abnormalities of gait and mobility: Secondary | ICD-10-CM

## 2016-07-26 DIAGNOSIS — M6281 Muscle weakness (generalized): Secondary | ICD-10-CM | POA: Diagnosis not present

## 2016-07-26 NOTE — Therapy (Signed)
[facility] [address] [place], [place], [address] Phone: [phone]   Fax:  [phone]  Physical Therapy Treatment  Patient Details  Name: Madison Odom MRN: [id number] Date of Birth: 11-22-1933 Referring Provider: Dr. Dorothyann Peng  Encounter Date: 07/26/2016      PT End of Session - 07/26/16 1144    Visit Number 21   Number of Visits 20   Date for PT Re-Evaluation 08/14/16   Authorization Type medicare; g-codes at visit 45, KX at 92   PT Start Time 1100   PT Stop Time 1140   PT Time Calculation (min) 40 min   Activity Tolerance Patient tolerated treatment well   Behavior During Therapy [facility] for tasks assessed/performed      Past Medical History:  Diagnosis Date  . GERD (gastroesophageal reflux disease)   . Hyperlipidemia   . MVP (mitral valve prolapse)   . OP (osteoporosis)   . Pelvis fracture ([place]) 09/2015   CLOSED   . Scoliosis   . Spastic colon     Past Surgical History:  Procedure Laterality Date  . ABDOMINAL HYSTERECTOMY  1965  . APPENDECTOMY    . BILATERAL OOPHORECTOMY  1981  . EYE SURGERY    . ROTATOR CUFF REPAIR Right     There were no vitals filed for this visit.          [facility] PT Assessment - 07/26/16 0001      Assessment   Medical Diagnosis R29.818 Balance disorder   Referring Provider Dr. Dorothyann Peng   Onset Date/Surgical Date 11/14/15     Precautions   Precautions Other (comment)   Precaution Comments osteoporosis     Restrictions   Weight Bearing Restrictions No     Balance Screen   Has the patient fallen in the past 6 months No   Has the patient had a decrease in activity level because of a fear of falling?  No   Is the patient reluctant to leave their home because of a fear of falling?  No     Home Ecologist residence     Cognition   Overall Cognitive Status Within Functional Limits for tasks assessed     Strength    Right Hip Flexion 4+/5   Right Hip ABduction 4+/5   Left Hip Flexion 4+/5   Left Hip Extension 4/5   Left Hip ABduction 4+/5   Right Knee Flexion 5/5   Right Knee Extension 5/5   Left Knee Flexion 5/5   Left Knee Extension 5/5   Right Ankle Inversion 5/5     Berg Balance Test   Sit to Stand Able to stand without using hands and stabilize independently   Standing Unsupported Able to stand safely 2 minutes   Sitting with Back Unsupported but Feet Supported on Floor or Stool Able to sit safely and securely 2 minutes   Stand to Sit Sits safely with minimal use of hands   Transfers Able to transfer safely, minor use of hands   Standing Unsupported with Eyes Closed Able to stand 10 seconds safely   Standing Ubsupported with Feet Together Able to place feet together independently and stand 1 minute safely   From Standing, Reach Forward with Outstretched Arm Can reach confidently >25 cm (10")   From Standing Position, Pick up Object from Floor Able to pick up shoe safely and easily   From Standing Position, Turn to Look Behind Over each Shoulder  Looks behind from both sides and weight shifts well   Turn 360 Degrees Able to turn 360 degrees safely in 4 seconds or less   Standing Unsupported, Alternately Place Feet on Step/Stool Able to stand independently and safely and complete 8 steps in 20 seconds   Standing Unsupported, One Foot in Front Able to place foot tandem independently and hold 30 seconds   Standing on One Leg Able to lift leg independently and hold 5-10 seconds   Total Score 55                     [facility] Adult PT Treatment/Exercise - 07/26/16 0001      Ambulation/Gait   Ambulation/Gait Assistance 5: Supervision   Ambulation Distance (Feet) 250 Feet   Assistive device Straight cane   Ambulation Surface Level;Unlevel;Paved   Curb 5: Supervision     Knee/Hip Exercises: Standing   Hip ADduction Right;Left;Strengthening;10 reps;1 set   Hip Abduction  Stengthening;Right;Left;1 set;10 reps   Hip Extension Stengthening;Right;Left;1 set;10 reps   Other Standing Knee Exercises heel/toe raises 30 times     Knee/Hip Exercises: Seated   Long Arc Quad Strengthening;Right;Left;1 set;10 reps                PT Education - 07/26/16 1132    Education provided Yes   Education Details reviewed past HEP and gave her new copies   Person(s) Educated Patient   Methods Explanation   Comprehension Verbalized understanding          PT Short Term Goals - 06/14/16 1158      PT SHORT TERM GOAL #1   Title indepedent with initial HEP   Time 4   Period Weeks   Status Achieved     PT SHORT TERM GOAL #2   Title berg balance score >/= 45/56    Time 4   Period Weeks   Status Achieved     PT SHORT TERM GOAL #3   Title improved confidence with walking outside increased >/= 25%   Time 4   Period Weeks   Status Achieved           PT Long Term Goals - 07/26/16 1112      PT LONG TERM GOAL #1   Title independent with HEP   Time 8   Period Weeks   Status Achieved     PT LONG TERM GOAL #2   Title Berg balance score >/= 50/56   Time 8   Period Weeks   Status Achieved     PT LONG TERM GOAL #4   Title improved trunk strength  so she is able to stand >/= 4 feet 9 inches   Time 8   Period Weeks   Status Achieved     PT LONG TERM GOAL #6   Title pick up a grocery bag and not feel like she is going to loose her balance due to increased strength in legs   Time 8   Period Weeks   Status Achieved     PT LONG TERM GOAL #7   Title walk outside on unlevel surface with spc with increased confidence >/= 90% due to improved balance and strength    Time 8   Period Weeks   Status Achieved               Plan - 07/26/16 1135    Clinical Impression Statement Patient has met all of her goals.  She is 95% confident with walking  outside using a SPC.  Patient report she has less energy since her fall. Berg balance score has increased  to 55/56.  Bilateral lower extremity strength has improved. Patient is independent with HEP. Patient agrees to discharge.    Rehab Potential Good   Clinical Impairments Affecting Rehab Potential None   PT Treatment/Interventions Gait training;Functional mobility training;Therapeutic activities;Therapeutic exercise;Balance training;Patient/family education;Neuromuscular re-education;Manual techniques;Electrical Stimulation;Moist Heat;Energy conservation;Passive range of motion   PT Next Visit Plan Discharge to HEP   PT Home Exercise Plan Current HEP   Consulted and Agree with Plan of Care Patient      Patient will benefit from skilled therapeutic intervention in order to improve the following deficits and impairments:  Abnormal gait, Increased fascial restricitons, Decreased mobility, Postural dysfunction, Decreased activity tolerance, Decreased endurance, Decreased range of motion, Decreased strength, Decreased balance, Difficulty walking  Visit Diagnosis: Muscle weakness (generalized)  Other abnormalities of gait and mobility       G-Codes - 2016-08-23 1137    Functional Assessment Tool Used BERG balance is now 55/56: goal was 49/56.   Functional Limitation Mobility: Walking and moving around   Mobility: Walking and Moving Around Goal Status [phone]) At least 1 percent but less than 20 percent impaired, limited or restricted   Mobility: Walking and Moving Around Discharge Status [phone]) At least 1 percent but less than 20 percent impaired, limited or restricted      Problem List Patient Active Problem List   Diagnosis Date Noted  . Acute blood loss anemia 09/27/2015  . Pelvic fracture ([facility]) 09/21/2015  . Closed fracture of pelvis ([place])   . Kyphoscoliosis 09/16/2013  . Mitral valve prolapse 05/24/2011  . Hypercholesterolemia 05/24/2011    Earlie Counts, PT 23-Aug-2016 11:45 AM   [facility] [address] [place], [place], [address] Phone: [phone]   Fax:  [phone]  Name: ALESANDRA SMART MRN: [id number] Date of Birth: 1934-05-25  PHYSICAL THERAPY DISCHARGE SUMMARY  Visits from Start of Care: 21  Current functional level related to goals / functional outcomes: See above.    Remaining deficits: See above.    Education / Equipment: HEP Plan: Patient agrees to discharge.  Patient goals were met. Patient is being discharged due to meeting the stated rehab goals.  Thank you for the referral. Earlie Counts, PT 08-23-16 11:41 AM  ?????

## 2016-08-16 ENCOUNTER — Encounter: Payer: Self-pay | Admitting: Cardiology

## 2016-08-30 ENCOUNTER — Ambulatory Visit (INDEPENDENT_AMBULATORY_CARE_PROVIDER_SITE_OTHER): Payer: Medicare Other | Admitting: Cardiology

## 2016-08-30 ENCOUNTER — Encounter: Payer: Self-pay | Admitting: Cardiology

## 2016-08-30 VITALS — BP 152/78 | HR 65 | Ht <= 58 in | Wt 85.2 lb

## 2016-08-30 DIAGNOSIS — I10 Essential (primary) hypertension: Secondary | ICD-10-CM

## 2016-08-30 DIAGNOSIS — I341 Nonrheumatic mitral (valve) prolapse: Secondary | ICD-10-CM

## 2016-08-30 DIAGNOSIS — E7800 Pure hypercholesterolemia, unspecified: Secondary | ICD-10-CM | POA: Diagnosis not present

## 2016-08-30 DIAGNOSIS — I272 Pulmonary hypertension, unspecified: Secondary | ICD-10-CM

## 2016-08-30 NOTE — Progress Notes (Addendum)
Cardiology Office Note    Date:  08/31/2016   ID:  Madison Odom, DOB 1933/12/29, MRN [id number]  PCP:  [name], NP  Cardiologist:  Dr Patty Sermons --> Tobias Alexander, MD   Chief complain: 6 months follow-up  History of Present Illness:  Madison Odom is a 82 y.o. femalewith h/o mitral valve prolapse and history of hypercholesterolemia. She has a history of scoliosis and osteoporosis.She has terrible osteoarthritis and had a fall on 09/21/15 causing closed fracture of pelvis that did not require surgery. She went to a rehabilitation facility but is now home. She had physical therapy at home. She is doing well. She is walking with a rolling walker. She is not having any cardiac symptoms from her mitral valve prolapse.   This is a first-time visit after transitioning from Dr. Patty Sermons to me, she denies any chest pain or shortness of breath no orthopnea, paroxysmal nocturnal dyspnea, no lower extremity edema palpitations or syncope. She is compliant with her meds and has no side effects. She walks up to 4 hours in a mall with her daughter, no falls. She appears very frail. She walks with a cane now, but without a cane at home.   Past Medical History:  Diagnosis Date  . GERD (gastroesophageal reflux disease)   . Hyperlipidemia   . MVP (mitral valve prolapse)   . OP (osteoporosis)   . Pelvis fracture (HCC) 09/2015   CLOSED   . Scoliosis   . Spastic colon     Past Surgical History:  Procedure Laterality Date  . ABDOMINAL HYSTERECTOMY  1965  . APPENDECTOMY    . BILATERAL OOPHORECTOMY  1981  . EYE SURGERY    . ROTATOR CUFF REPAIR Right    Current Medications: Outpatient Medications Prior to Visit  Medication Sig Dispense Refill  . aspirin 81 MG EC tablet Take 81 mg by mouth daily.      . Calcium Carb-Cholecalciferol (CALCIUM 500 +D PO) Take 1 tablet by mouth daily.     . Multiple Vitamin (MULTIVITAMIN) tablet Take 1 tablet by mouth daily.    . nadolol (CORGARD) 80 MG  tablet Take 1 tablet (80 mg total) by mouth daily. 90 tablet 3  . omeprazole (PRILOSEC) 20 MG capsule Take 1 capsule (20 mg total) by mouth daily. 30 capsule 11  . simvastatin (ZOCOR) 20 MG tablet Take 20 mg by mouth daily.     No facility-administered medications prior to visit.    Allergies:   Penicillins; Phenothiazines; and Tetracyclines & related   Social History   Social History  . Marital status: Married    Spouse name: N/A  . Number of children: N/A  . Years of education: N/A   Social History Main Topics  . Smoking status: Never Smoker  . Smokeless tobacco: Never Used  . Alcohol use No  . Drug use: No  . Sexual activity: Not Asked   Other Topics Concern  . None   Social History Narrative   1 year of college    Widow   Two daughters        Family History:  The patient's family history includes Heart attack in her mother; Heart disease in her father; Hypertension in her father and mother.   ROS:   Please see the history of present illness.    ROS All other systems reviewed and are negative.  PHYSICAL EXAM:   VS:  BP (!) 152/78   Pulse 65   Ht 4\' 10"  (1.473 m)  Wt 85 lb 3.2 oz (38.6 kg)   SpO2 94%   BMI 17.81 kg/m    GEN: Well nourished, well developed, in no acute distress  HEENT: normal  Neck: no JVD, carotid bruits, or masses Cardiac: RRR; no murmurs, rubs, or gallops,no edema  Respiratory:  clear to auscultation bilaterally, normal work of breathing GI: soft, nontender, nondistended, + BS MS: no deformity or atrophy  Skin: warm and dry, no rash Neuro:  Alert and Oriented x 3, Strength and sensation are intact Psych: euthymic mood, full affect  Wt Readings from Last 3 Encounters:  08/30/16 85 lb 3.2 oz (38.6 kg)  05/29/16 85 lb 6.4 oz (38.7 kg)  04/20/16 85 lb (38.6 kg)    Studies/Labs Reviewed:   Recent Labs: 09/22/2015: Magnesium 1.8; TSH 0.734 12/01/2015: ALT 14; BUN 24; Creat 0.58; Potassium 4.1; Sodium 136 04/20/2016: Hemoglobin 11.6;  Platelets 381.0   Lipid Panel    Component Value Date/Time   CHOL 153 12/01/2015 0838   TRIG 101 12/01/2015 0838   HDL 73 12/01/2015 0838   CHOLHDL 2.1 12/01/2015 0838   VLDL 20 12/01/2015 0838   LDLCALC 60 12/01/2015 0838   TTE: 09/2015 - Left ventricle: The cavity size was normal. Wall thickness was  increased in a pattern of mild LVH. Systolic function was normal.  The estimated ejection fraction was in the range of 60% to 65%.  Wall motion was normal; there were no regional wall motion  abnormalities. Doppler parameters are consistent with abnormal  left ventricular relaxation (grade 1 diastolic dysfunction). - Aortic valve: There was trivial regurgitation. - Mitral valve: Prolapse cannot be excluded. No MR. - Tricuspid valve: There was moderate regurgitation. - Pulmonary arteries: PA peak pressure: 62 mm Hg (S).    ASSESSMENT:    1. Hypercholesterolemia   2. Mitral valve prolapse   3. Essential hypertension   4. Pulmonary hypertension     PLAN:  In order of problems listed above:  1. Pulmonary HTN - no DOE, remains active 2. Hypertension - white coat syndrome, repeat BP normal 3. HLP - on simvastatin - all lipids at goal in Jan 2017   Medication Adjustments/Labs and Tests Ordered: Current medicines are reviewed at length with the patient today.  Concerns regarding medicines are outlined above.  Medication changes, Labs and Tests ordered today are listed in the Patient Instructions below. Patient Instructions  Medication Instructions:   Your physician recommends that you continue on your current medications as directed. Please refer to the Current Medication list given to you today.   Follow-Up:  Your physician wants you to follow-up in: 6 MONTHS WITH DR Johnell ComingsNELSON You will receive a reminder letter in the mail two months in advance. If you don't receive a letter, please call our office to schedule the follow-up appointment.      If you need a refill on  your cardiac medications before your next appointment, please call your pharmacy.  '  Signed, Tobias AlexanderKatarina Makai Dumond, MD  08/31/2016 7:14 AM    [facility] [address], [place], [place]  [address] Phone: [phone]; Fax: [phone]

## 2016-08-30 NOTE — Patient Instructions (Signed)

## 2016-11-16 ENCOUNTER — Other Ambulatory Visit: Payer: Self-pay | Admitting: Adult Health

## 2016-11-16 ENCOUNTER — Other Ambulatory Visit (INDEPENDENT_AMBULATORY_CARE_PROVIDER_SITE_OTHER): Payer: Medicare Other

## 2016-11-16 DIAGNOSIS — Z0000 Encounter for general adult medical examination without abnormal findings: Secondary | ICD-10-CM | POA: Diagnosis not present

## 2016-11-16 LAB — LIPID PANEL
CHOLESTEROL: 178 mg/dL (ref 0–200)
HDL: 69.2 mg/dL (ref >39.00)
LDL Cholesterol: 89 mg/dL (ref 0–99)
NonHDL: 108.45
TRIGLYCERIDES: 96 mg/dL (ref 0.0–149.0)
Total CHOL/HDL Ratio: 3
VLDL: 19.2 mg/dL (ref 0.0–40.0)

## 2016-11-16 LAB — HEPATIC FUNCTION PANEL
ALBUMIN: 4 g/dL (ref 3.5–5.2)
ALT: 16 U/L (ref 0–35)
AST: 22 U/L (ref 0–37)
Alkaline Phosphatase: 69 U/L (ref 39–117)
Bilirubin, Direct: 0.1 mg/dL (ref 0.0–0.3)
Total Bilirubin: 0.4 mg/dL (ref 0.2–1.2)
Total Protein: 6.5 g/dL (ref 6.0–8.3)

## 2016-11-16 LAB — BASIC METABOLIC PANEL
BUN: 25 mg/dL — ABNORMAL HIGH (ref 6–23)
CALCIUM: 9.4 mg/dL (ref 8.4–10.5)
CO2: 30 meq/L (ref 19–32)
Chloride: 102 mEq/L (ref 96–112)
Creatinine, Ser: 0.53 mg/dL (ref 0.40–1.20)
GFR: 117.79 mL/min (ref >60.00)
Glucose, Bld: 108 mg/dL — ABNORMAL HIGH (ref 70–99)
Potassium: 4.4 mEq/L (ref 3.5–5.1)
SODIUM: 140 meq/L (ref 135–145)

## 2016-11-16 LAB — CBC WITH DIFFERENTIAL/PLATELET
BASOS ABS: 0 10*3/uL (ref 0.0–0.1)
Basophils Relative: 0.5 % (ref 0.0–3.0)
Eosinophils Absolute: 0.1 10*3/uL (ref 0.0–0.7)
Eosinophils Relative: 0.8 % (ref 0.0–5.0)
HCT: 40.3 % (ref 36.0–46.0)
Hemoglobin: 13.5 g/dL (ref 12.0–15.0)
LYMPHS ABS: 2.1 10*3/uL (ref 0.7–4.0)
Lymphocytes Relative: 20.6 % (ref 12.0–46.0)
MCHC: 33.4 g/dL (ref 30.0–36.0)
MCV: 84.1 fl (ref 78.0–100.0)
MONOS PCT: 8 % (ref 3.0–12.0)
Monocytes Absolute: 0.8 10*3/uL (ref 0.1–1.0)
NEUTROS ABS: 7.1 10*3/uL (ref 1.4–7.7)
NEUTROS PCT: 70.1 % (ref 43.0–77.0)
PLATELETS: 268 10*3/uL (ref 150.0–400.0)
RBC: 4.8 Mil/uL (ref 3.87–5.11)
RDW: 23.2 % — AB (ref 11.5–15.5)
WBC: 10.2 10*3/uL (ref 4.0–10.5)

## 2016-11-16 LAB — TSH: TSH: 0.9 u[IU]/mL (ref 0.35–4.50)

## 2016-11-16 LAB — POC URINALSYSI DIPSTICK (AUTOMATED)
BILIRUBIN UA: NEGATIVE
Blood, UA: NEGATIVE
Glucose, UA: NEGATIVE
Ketones, UA: NEGATIVE
NITRITE UA: POSITIVE
Spec Grav, UA: 1.02
Urobilinogen, UA: 0.2
pH, UA: 5.5

## 2016-11-18 LAB — URINE CULTURE

## 2016-11-21 ENCOUNTER — Encounter: Payer: Medicare Other | Admitting: Adult Health

## 2016-11-21 ENCOUNTER — Encounter: Payer: Self-pay | Admitting: Adult Health

## 2016-11-21 ENCOUNTER — Ambulatory Visit (INDEPENDENT_AMBULATORY_CARE_PROVIDER_SITE_OTHER): Payer: Medicare Other | Admitting: Adult Health

## 2016-11-21 VITALS — BP 136/72 | Temp 98.0°F | Ht <= 58 in | Wt 84.6 lb

## 2016-11-21 DIAGNOSIS — Z0000 Encounter for general adult medical examination without abnormal findings: Secondary | ICD-10-CM

## 2016-11-21 DIAGNOSIS — E7800 Pure hypercholesterolemia, unspecified: Secondary | ICD-10-CM

## 2016-11-21 DIAGNOSIS — N3000 Acute cystitis without hematuria: Secondary | ICD-10-CM

## 2016-11-21 MED ORDER — CIPROFLOXACIN HCL 500 MG PO TABS: 500.0000 mg | ORAL_TABLET | Freq: Two times a day (BID) | ORAL | 0 refills | Status: DC

## 2016-11-21 NOTE — Progress Notes (Signed)
Subjective:    Patient ID: Madison Odom, female    DOB: October 09, 1934, 83 y.o.   MRN: [id number]  HPI  Patient presents for yearly preventative medicine examination. She is a pleasant 83 year old female who  has a past medical history of GERD (gastroesophageal reflux disease); Hyperlipidemia; MVP (mitral valve prolapse); OP (osteoporosis); Pelvis fracture (HCC) (09/2015); Scoliosis; and Spastic colon.  All immunizations and health maintenance protocols were reviewed with the patient and needed orders were placed.  Medication reconciliation,  past medical history, social history, problem list and allergies were reviewed in detail with the patient  Goals were established with regard to weight loss, exercise, and  diet in compliance with medications  End of life planning was discussed. She has an advanced directive and living will.   She is complaint with her medications and sees Dr. Delton See with Cardiology twice a year.   She has recovered well s/p hip surgery and is walking with a single prong cane.   She does not have any acute complaints.    Review of Systems  Constitutional: Negative.   HENT: Negative.   Eyes: Negative.   Respiratory: Negative.   Cardiovascular: Negative.   Gastrointestinal: Negative.   Endocrine: Negative.   Genitourinary: Negative.   Musculoskeletal: Positive for arthralgias, back pain and gait problem.  Skin: Negative.   Allergic/Immunologic: Negative.   Psychiatric/Behavioral: Negative.   All other systems reviewed and are negative.  Past Medical History:  Diagnosis Date  . GERD (gastroesophageal reflux disease)   . Hyperlipidemia   . MVP (mitral valve prolapse)   . OP (osteoporosis)   . Pelvis fracture (HCC) 09/2015   CLOSED   . Scoliosis   . Spastic colon     Social History   Social History  . Marital status: Married    Spouse name: N/A  . Number of children: N/A  . Years of education: N/A   Occupational History  . Not on file.    Social History Main Topics  . Smoking status: Never Smoker  . Smokeless tobacco: Never Used  . Alcohol use No  . Drug use: No  . Sexual activity: Not on file   Other Topics Concern  . Not on file   Social History Narrative   1 year of college    Widow   Two daughters        Past Surgical History:  Procedure Laterality Date  . ABDOMINAL HYSTERECTOMY  1965  . APPENDECTOMY    . BILATERAL OOPHORECTOMY  1981  . EYE SURGERY    . ROTATOR CUFF REPAIR Right     Family History  Problem Relation Age of Onset  . Heart attack Mother   . Hypertension Mother   . Heart disease Father   . Hypertension Father   . Stroke Neg Hx     Allergies  Allergen Reactions  . Penicillins     Yeast infections  . Phenothiazines Other (See Comments)    Unknown reaction  . Tetracyclines & Related Other (See Comments)    Unknown reaction    Current Outpatient Prescriptions on File Prior to Visit  Medication Sig Dispense Refill  . aspirin 81 MG EC tablet Take 81 mg by mouth daily.      . Calcium Carb-Cholecalciferol (CALCIUM 500 +D PO) Take 1 tablet by mouth daily.     . Multiple Vitamin (MULTIVITAMIN) tablet Take 1 tablet by mouth daily.    . nadolol (CORGARD) 80 MG tablet Take 1 tablet (80  mg total) by mouth daily. 90 tablet 3  . omeprazole (PRILOSEC) 20 MG capsule Take 1 capsule (20 mg total) by mouth daily. 30 capsule 11  . simvastatin (ZOCOR) 20 MG tablet Take 20 mg by mouth daily.     No current facility-administered medications on file prior to visit.     BP 136/72   Temp 98 F (36.7 C) (Oral)   Ht 4\' 10"  (1.473 m)   Wt 84 lb 9.6 oz (38.4 kg)   BMI 17.68 kg/m       Objective:   Physical Exam  Constitutional: She is oriented to person, place, and time. She appears well-developed and well-nourished. No distress.  HENT:  Head: Normocephalic and atraumatic.  Right Ear: External ear normal.  Left Ear: External ear normal.  Nose: Nose normal.  Mouth/Throat: Oropharynx is  clear and moist. No oropharyngeal exudate.  Eyes: Conjunctivae and EOM are normal. Pupils are equal, round, and reactive to light. Right eye exhibits no discharge. Left eye exhibits no discharge. No scleral icterus.  Neck: Normal range of motion. Neck supple. No JVD present. No tracheal deviation present. No thyromegaly present.  Cardiovascular: Normal rate, regular rhythm, normal heart sounds and intact distal pulses.  Exam reveals no gallop.   No murmur heard. Pulmonary/Chest: Effort normal and breath sounds normal. No stridor. No respiratory distress. She has no wheezes. She has no rales. She exhibits no tenderness.  Abdominal: Soft. Bowel sounds are normal. She exhibits no distension and no mass. There is no tenderness. There is no rebound and no guarding.  Musculoskeletal: Normal range of motion. She exhibits no edema, tenderness or deformity.  Kyphosis   Lymphadenopathy:    She has no cervical adenopathy.  Neurological: She is alert and oriented to person, place, and time. She has normal reflexes. She displays normal reflexes. No cranial nerve deficit. She exhibits normal muscle tone. Coordination normal.  Skin: Skin is warm and dry. No rash noted. No erythema. No pallor.  Psychiatric: She has a normal mood and affect. Her behavior is normal. Judgment and thought content normal.  Nursing note and vitals reviewed.     Assessment & Plan:  1. Routine general medical examination at a health care facility - Reviewed labs in detail with patient. All questions answered - Continue to exercise and eat healthy   2. Hypercholesterolemia - Controlled with Zocor  - No changes at this time   3. Acute cystitis without hematuria - Asymptomatic  - Labs showed UTI  - ciprofloxacin (CIPRO) 500 MG tablet; Take 1 tablet (500 mg total) by mouth 2 (two) times daily.  Dispense: 6 tablet; Refill: 0   Shirline Freesory Shadow Schedler, NP

## 2016-11-21 NOTE — Patient Instructions (Signed)
It was great seeing you today!  Your labs look great.   You do have a urinary tract infection. I have sent in a prescription for Cipro, take this twice a day for three days.   Follow up with me in one year or sooner if needed

## 2016-12-01 ENCOUNTER — Other Ambulatory Visit: Payer: Self-pay | Admitting: *Deleted

## 2016-12-01 MED ORDER — SIMVASTATIN 20 MG PO TABS: 20.0000 mg | ORAL_TABLET | Freq: Every day | ORAL | 6 refills | Status: DC

## 2017-01-01 ENCOUNTER — Telehealth: Payer: Self-pay | Admitting: Adult Health

## 2017-01-01 NOTE — Telephone Encounter (Signed)
Pt fell on 12/28/16 and thinks that she has possible rib fracture and refuse to make appointment with another provider or go to the ER

## 2017-01-02 ENCOUNTER — Ambulatory Visit (INDEPENDENT_AMBULATORY_CARE_PROVIDER_SITE_OTHER)
Admission: RE | Admit: 2017-01-02 | Discharge: 2017-01-02 | Disposition: A | Discharge status: Home / Self Care | Payer: Medicare Other | Source: Ambulatory Visit | Attending: Adult Health | Admitting: Adult Health

## 2017-01-02 ENCOUNTER — Encounter: Payer: Self-pay | Admitting: Adult Health

## 2017-01-02 ENCOUNTER — Ambulatory Visit (INDEPENDENT_AMBULATORY_CARE_PROVIDER_SITE_OTHER): Payer: Medicare Other | Admitting: Adult Health

## 2017-01-02 VITALS — BP 160/78 | Temp 97.6°F | Ht <= 58 in | Wt 85.0 lb

## 2017-01-02 DIAGNOSIS — R109 Unspecified abdominal pain: Secondary | ICD-10-CM

## 2017-01-02 MED ORDER — MELOXICAM 15 MG PO TABS: 15.0000 mg | ORAL_TABLET | Freq: Every day | ORAL | 0 refills | Status: DC

## 2017-01-02 NOTE — Progress Notes (Signed)
Subjective:    Patient ID: Cheryl Flash, female    DOB: [birth year], 83 y.o.   MRN: [id number]  HPI  83 year old female who presents to the office today for pain in her left flank and left mid back. She reports that 5 days ago she was bent over to pick something up off the floor when she lost her balance and fell onto her left side. She denies any bruising but her pain has been getting worse as the days pass. She has been taking Advil without relief. She is worried that she may have a broken rib.   Pain is worse with movement and palpation. She denies any pain with deep breathing.   She did not hit her head and has not had any issues with ambulation.   Review of Systems  Constitutional: Negative.   Respiratory: Negative.   Cardiovascular: Negative.   Musculoskeletal: Positive for arthralgias and back pain. Negative for gait problem and joint swelling.  Skin: Negative.   All other systems reviewed and are negative.  Past Medical History:  Diagnosis Date  . GERD (gastroesophageal reflux disease)   . Hyperlipidemia   . MVP (mitral valve prolapse)   . OP (osteoporosis)   . Pelvis fracture (HCC) 09/2015   CLOSED   . Scoliosis   . Spastic colon     Social History   Social History  . Marital status: Married    Spouse name: N/A  . Number of children: N/A  . Years of education: N/A   Occupational History  . Not on file.   Social History Main Topics  . Smoking status: Never Smoker  . Smokeless tobacco: Never Used  . Alcohol use No  . Drug use: No  . Sexual activity: Not on file   Other Topics Concern  . Not on file   Social History Narrative   1 year of college    Widow   Two daughters        Past Surgical History:  Procedure Laterality Date  . ABDOMINAL HYSTERECTOMY  1965  . APPENDECTOMY    . BILATERAL OOPHORECTOMY  1981  . EYE SURGERY    . ROTATOR CUFF REPAIR Right     Family History  Problem Relation Age of Onset  . Heart attack Mother   .  Hypertension Mother   . Heart disease Father   . Hypertension Father   . Stroke Neg Hx     Allergies  Allergen Reactions  . Penicillins     Yeast infections  . Phenothiazines Other (See Comments)    Unknown reaction  . Tetracyclines & Related Other (See Comments)    Unknown reaction    Current Outpatient Prescriptions on File Prior to Visit  Medication Sig Dispense Refill  . aspirin 81 MG EC tablet Take 81 mg by mouth daily.      . Calcium Carb-Cholecalciferol (CALCIUM 500 +D PO) Take 1 tablet by mouth daily.     . ciprofloxacin (CIPRO) 500 MG tablet Take 1 tablet (500 mg total) by mouth 2 (two) times daily. 6 tablet 0  . Multiple Vitamin (MULTIVITAMIN) tablet Take 1 tablet by mouth daily.    . nadolol (CORGARD) 80 MG tablet Take 1 tablet (80 mg total) by mouth daily. 90 tablet 3  . omeprazole (PRILOSEC) 20 MG capsule Take 1 capsule (20 mg total) by mouth daily. 30 capsule 11  . simvastatin (ZOCOR) 20 MG tablet Take 1 tablet (20 mg total) by mouth daily. 30  tablet 6   No current facility-administered medications on file prior to visit.     BP (!) 160/78   Temp 97.6 F (36.4 C) (Oral)   Ht 4\' 10"  (1.473 m)   Wt 85 lb (38.6 kg)   BMI 17.77 kg/m       Objective:   Physical Exam  Constitutional: She is oriented to person, place, and time. She appears well-developed and well-nourished. No distress.  Cardiovascular: Normal rate, regular rhythm, normal heart sounds and intact distal pulses.  Exam reveals no gallop and no friction rub.   No murmur heard. Pulmonary/Chest: Effort normal and breath sounds normal. No respiratory distress. She has no wheezes. She has no rales. She exhibits tenderness.  Musculoskeletal: She exhibits tenderness.  Tenderness along left flank and left mid back. No bruising noted.   Neurological: She is alert and oriented to person, place, and time.  Skin: Skin is warm and dry. No rash noted. She is not diaphoretic. No erythema. No pallor.    Psychiatric: She has a normal mood and affect. Her behavior is normal. Judgment and thought content normal.  Nursing note and vitals reviewed.      Assessment & Plan:  1. Left flank pain - possible rib fracture. She would like to have an x ray - meloxicam (MOBIC) 15 MG tablet; Take 1 tablet (15 mg total) by mouth daily.  Dispense: 30 tablet; Refill: 0 - DG Chest 2 View; Future - DG Thoracic Spine 2 View; Future - Take Mobic as needed  - Heating pad  - Deep breathing exercises  Shirline Freesory Margareth Kanner, NP

## 2017-01-02 NOTE — Telephone Encounter (Signed)
Spoke with Sheena who [facility] to [facility]. appt has been scheduled.  Nothing further needed.

## 2017-01-03 ENCOUNTER — Other Ambulatory Visit: Payer: Self-pay

## 2017-01-03 ENCOUNTER — Telehealth: Payer: Self-pay | Admitting: Adult Health

## 2017-01-03 MED ORDER — TRAMADOL HCL 50 MG PO TABS: 50.0000 mg | ORAL_TABLET | Freq: Every evening | ORAL | 0 refills | Status: DC | PRN

## 2017-01-03 NOTE — Telephone Encounter (Signed)
Her x rays do not show any rib fractures. She does have some compression fractures in her lower back. It is unknown how long they have been present but do to osteopenia/osteoporis, these are not likely new.   We can try a few days of Tramadol but it can make her sleepy

## 2017-01-03 NOTE — Telephone Encounter (Signed)
Rx has been called in as directed.  

## 2017-01-03 NOTE — Telephone Encounter (Signed)
Pt is calling to check the status of the previous message and state that [facility] really [facility] end of the day b/c she is in a lot of pain.

## 2017-01-03 NOTE — Telephone Encounter (Signed)
Patient notified of Cory's recommendations. Patient states that she would like to try Tramadol.  Please advise on dosing and I will send in. Thanks!

## 2017-01-03 NOTE — Telephone Encounter (Signed)
Pt states she is still in a lot of pain.  Woke up about 5 am this morning and has not been able to rest yet. [facility] to advise on what else she can do.

## 2017-01-03 NOTE — Telephone Encounter (Signed)
50 mg QHS PRN, 10 pills, 0 refills.

## 2017-01-03 NOTE — Telephone Encounter (Signed)
See below

## 2017-01-16 ENCOUNTER — Telehealth: Payer: Self-pay | Admitting: Adult Health

## 2017-01-16 NOTE — Telephone Encounter (Signed)
Pt would like a call back to discuss a med [facility] put her in. Pt states she thinks the  traMADol (ULTRAM) 50 MG tablet  is causing her feet to swell and bp to go up. Pt would like to discuss a possible alternative.

## 2017-01-16 NOTE — Telephone Encounter (Signed)
See below and advise

## 2017-01-16 NOTE — Telephone Encounter (Signed)
In a small population of people, less than 5 %. Tramadol can cause both of her symptoms.   We may need to send her to orthopedics for further evaluation if she continues to have pain.   I would not feel comfortable going up to a narcotic pain medication

## 2017-01-16 NOTE — Telephone Encounter (Signed)
I notified patient of Cory's comments. She states that she has since stopped taking the tramadol and has been propping her feet up and the swelling has went down. She also states that her BP is now regulated and is running about 130/70. She states that she is feeling a little bit better today, and she will call back about orthopedic referral if it does not continue to get better.

## 2017-02-06 ENCOUNTER — Ambulatory Visit (INDEPENDENT_AMBULATORY_CARE_PROVIDER_SITE_OTHER): Payer: Medicare Other | Admitting: Adult Health

## 2017-02-06 ENCOUNTER — Encounter: Payer: Self-pay | Admitting: Adult Health

## 2017-02-06 VITALS — BP 118/72 | Temp 97.4°F

## 2017-02-06 DIAGNOSIS — S8011XA Contusion of right lower leg, initial encounter: Secondary | ICD-10-CM | POA: Diagnosis not present

## 2017-02-06 DIAGNOSIS — W19XXXA Unspecified fall, initial encounter: Secondary | ICD-10-CM | POA: Diagnosis not present

## 2017-02-06 NOTE — Progress Notes (Signed)
Subjective:    Patient ID: Madison Odom, female    DOB: [birth year], 83 y.o.   MRN: [id number]  HPI  83 year old female who  has a past medical history of GERD (gastroesophageal reflux disease); Hyperlipidemia; MVP (mitral valve prolapse); OP (osteoporosis); Pelvis fracture (HCC) (09/2015); Scoliosis; and Spastic colon. She presents to the clinic today for an acute fall three days ago. Her daughters are with her at this visit to help supplement history .   Her daughters reports that on Saturday she was at the grocery store and she felt like " I couldn't lift up my feet." Her daughtter got her a wheelchair to use during the rest of her grocery shopping. When they got out to the car , Madison Odom felt as though she could not get into the car, her daughter went to help her and both lost their balance, resulting in a fall onto pavement. [name] denies hitting her head. She has some mid back pain and a bruise on her right leg. She is walking with a rolling walker without difficulty but feels as though she is off balance.   She has been taking Motrin without any relief of pain   She denies dizziness, lightheadedness, or syncopal episodes   Review of Systems Negative unless mentioned in HPI   Past Medical History:  Diagnosis Date  . GERD (gastroesophageal reflux disease)   . Hyperlipidemia   . MVP (mitral valve prolapse)   . OP (osteoporosis)   . Pelvis fracture (HCC) 09/2015   CLOSED   . Scoliosis   . Spastic colon     Social History   Social History  . Marital status: Married    Spouse name: N/A  . Number of children: N/A  . Years of education: N/A   Occupational History  . Not on file.   Social History Main Topics  . Smoking status: Never Smoker  . Smokeless tobacco: Never Used  . Alcohol use No  . Drug use: No  . Sexual activity: Not on file   Other Topics Concern  . Not on file   Social History Narrative   1 year of college    Widow   Two daughters        Past  Surgical History:  Procedure Laterality Date  . ABDOMINAL HYSTERECTOMY  1965  . APPENDECTOMY    . BILATERAL OOPHORECTOMY  1981  . EYE SURGERY    . ROTATOR CUFF REPAIR Right     Family History  Problem Relation Age of Onset  . Heart attack Mother   . Hypertension Mother   . Heart disease Father   . Hypertension Father   . Stroke Neg Hx     Allergies  Allergen Reactions  . Penicillins     Yeast infections  . Phenothiazines Other (See Comments)    Unknown reaction  . Tetracyclines & Related Other (See Comments)    Unknown reaction    Current Outpatient Prescriptions on File Prior to Visit  Medication Sig Dispense Refill  . aspirin 81 MG EC tablet Take 81 mg by mouth daily.      . Calcium Carb-Cholecalciferol (CALCIUM 500 +D PO) Take 1 tablet by mouth daily.     . Multiple Vitamin (MULTIVITAMIN) tablet Take 1 tablet by mouth daily.    . nadolol (CORGARD) 80 MG tablet Take 1 tablet (80 mg total) by mouth daily. 90 tablet 3  . omeprazole (PRILOSEC) 20 MG capsule Take 1 capsule (20 mg total)  by mouth daily. 30 capsule 11  . simvastatin (ZOCOR) 20 MG tablet Take 1 tablet (20 mg total) by mouth daily. 30 tablet 6   No current facility-administered medications on file prior to visit.     BP 118/72 (BP Location: Left Arm, Patient Position: Sitting, Cuff Size: Normal)   Temp 97.4 F (36.3 C) (Oral)       Objective:   Physical Exam  Constitutional: She is oriented to person, place, and time. She appears well-developed and well-nourished. No distress.  Cardiovascular: Normal rate, regular rhythm, normal heart sounds and intact distal pulses.  Exam reveals no gallop and no friction rub.   No murmur heard. Pulmonary/Chest: Effort normal and breath sounds normal. No respiratory distress. She has no wheezes. She has no rales. She exhibits no tenderness.  Musculoskeletal: Normal range of motion. She exhibits tenderness (right mid back ). She exhibits no edema or deformity.    Neurological: She is alert and oriented to person, place, and time.  Skin: Skin is warm and dry. No rash noted. She is not diaphoretic. No erythema. No pallor.  Large bruise in various stages of healing on right leg. No signs of trauma on back or head  Psychiatric: She has a normal mood and affect. Her behavior is normal. Judgment and thought content normal.  Nursing note and vitals reviewed.     Assessment & Plan:  1. Fall, initial encounter - Safety precautions given to patient and family.  - Encouraged to use her walker wherever she goes  - Ambulatory referral to Home Health - Follow up as needed  [name], NP

## 2017-02-08 ENCOUNTER — Telehealth: Payer: Self-pay | Admitting: Adult Health

## 2017-02-08 NOTE — Telephone Encounter (Signed)
Patient Name: Glenna Graver  DOB: July 29, 1934    Initial Comment Caller's grandmother did at home urinalysis test, may have UTI, number listed is patients    Nurse Assessment  Nurse: Hammonds, RN, Epifanio Lesches Date/Time (Eastern Time): 02/08/2017 3:33:24 PM  Confirm and document reason for call. If symptomatic, describe symptoms. ---Caller's grandmother did at home urinalysis test, may have UTI, number listed is patients. Caller states she has no urninary symptoms. They thought is may have caused my last fall last saturday. I didn't hit my head but my legs where sore. I had follow up visit after fall and xrays performed and all was OK.  Does the patient have any new or worsening symptoms? ---Yes  Will a triage be completed? ---Yes  Related visit to physician within the last 2 weeks? ---Yes  Does the PT have any chronic conditions? (i.e. diabetes, asthma, etc.) ---No  Is this a behavioral health or substance abuse call? ---No     Guidelines    Guideline Title Affirmed Question Affirmed Notes  Urinalysis Results Follow-up Call NEGATIVE urine test (i.e., NI - and LE - and WBC < 10) (all triage questions negative)    Final Disposition User   Home Care Hammonds, RN, Epifanio Lesches    Comments  I use [facility] [phone], No known allergies Was on antibiotic in January 2018 for UTI infection doesn't know name of antibiotic. She took OTC urinary tract infection kit and results was in "the middle" so it could be positive or negative. Caller is wanting an antibiotic called into her pharmacy.   Disagree/Comply: Comply

## 2017-02-08 NOTE — Telephone Encounter (Signed)
I cannot treat her due to home test readings. If she has no symptoms that is a good thing. If she feels like she has symptoms coming on over the weekend then I would advise her to follow up at urgent care or see me Tuesday .

## 2017-02-08 NOTE — Telephone Encounter (Signed)
Pt took home urinalysis with indeterminate results. She does not have any urinary symptoms. She would like abx sent in anyway.   Cory - Please advise. Thanks!

## 2017-02-12 ENCOUNTER — Telehealth: Payer: Self-pay | Admitting: Adult Health

## 2017-02-12 NOTE — Telephone Encounter (Signed)
° °  Kyla Balzarine with [facility] home health call to ask for verbal orders physical 1 time a week for 1 week and 2 times a week for 4 weeks effective 02/09/17  Ot for evalue and treat   [phone]

## 2017-02-13 NOTE — Telephone Encounter (Signed)
I received verbal order from Madison Odom that this is okay. Tatiana notified and verbalized understanding.

## 2017-02-14 NOTE — Telephone Encounter (Signed)
I left message for patient to return phone call.   

## 2017-02-23 DIAGNOSIS — M6281 Muscle weakness (generalized): Secondary | ICD-10-CM | POA: Diagnosis not present

## 2017-03-12 ENCOUNTER — Ambulatory Visit: Payer: Medicare Other | Admitting: Cardiology

## 2017-03-13 ENCOUNTER — Telehealth: Payer: Self-pay | Admitting: Adult Health

## 2017-03-13 ENCOUNTER — Other Ambulatory Visit: Payer: Self-pay | Admitting: Adult Health

## 2017-03-13 DIAGNOSIS — M549 Dorsalgia, unspecified: Principal | ICD-10-CM

## 2017-03-13 DIAGNOSIS — G8929 Other chronic pain: Secondary | ICD-10-CM

## 2017-03-13 NOTE — Telephone Encounter (Signed)
That's fine

## 2017-03-13 NOTE — Telephone Encounter (Signed)
Pt would like cory to return he call concerning her physical therapy. Pt is unable to come in due transportation

## 2017-03-13 NOTE — Telephone Encounter (Signed)
Referral has been placed and patient has been notified 

## 2017-03-13 NOTE — Telephone Encounter (Signed)
Patient states she is not satisfied with [facility] and would like new referral placed for [facility]. Is this okay?

## 2017-03-16 ENCOUNTER — Telehealth: Payer: Self-pay | Admitting: Adult Health

## 2017-03-16 NOTE — Telephone Encounter (Signed)
Madison [facility]dom [facility].  Per Madison Odom requirements, patient needs a face to face visit with Madison Odom in within 30 days to continue with PT.  Madison Odom, would you mind scheduling patient for 30 min follow up visit? Appointment needs to be before 04/16/17. Thank you!

## 2017-03-16 NOTE — Telephone Encounter (Signed)
Madison [facility]ungMonique from [facility] [phone] Needs a verbal okay to begin PT. 2 x week for 4 weeks  strengthening, gate balance training and home safety.

## 2017-03-16 NOTE — Telephone Encounter (Signed)
Noted! Thank you

## 2017-03-16 NOTE — Telephone Encounter (Signed)
Pt has no one to bring her during the day.  Had to schedule on a Tuesday, at 6 pm. (5/29)  I know this is not the ideal time, but pt states her daughter has to work until 5 pm. Please tell Kandee KeenCory I will NOT make a habit of this, and hope this is ok this one time   Thank you!!

## 2017-04-10 ENCOUNTER — Encounter: Payer: Self-pay | Admitting: Adult Health

## 2017-04-10 ENCOUNTER — Ambulatory Visit (INDEPENDENT_AMBULATORY_CARE_PROVIDER_SITE_OTHER): Payer: Medicare Other | Admitting: Adult Health

## 2017-04-10 VITALS — BP 122/72 | Temp 98.3°F | Ht <= 58 in | Wt 83.4 lb

## 2017-04-10 DIAGNOSIS — S81809A Unspecified open wound, unspecified lower leg, initial encounter: Secondary | ICD-10-CM | POA: Diagnosis not present

## 2017-04-10 DIAGNOSIS — R269 Unspecified abnormalities of gait and mobility: Secondary | ICD-10-CM

## 2017-04-10 MED ORDER — FLUCONAZOLE 150 MG PO TABS: 150.0000 mg | ORAL_TABLET | Freq: Once | ORAL | 1 refills | Status: AC

## 2017-04-10 MED ORDER — CEPHALEXIN 500 MG PO CAPS: 500.0000 mg | ORAL_CAPSULE | Freq: Two times a day (BID) | ORAL | 0 refills | Status: DC

## 2017-04-10 NOTE — Progress Notes (Signed)
Subjective:    Patient ID: Madison Odom, female    DOB: Oct 29, 1934, 83 y.o.   MRN: [id number]  HPI  83 year old female who presents to the clinic today for non healing wound on her right mid leg. The wound was sustained about 8 weeks ago during a mechanical fall. This was her only injury. The wound has yellow drainage. She denies any redness or pain. She has been using Neosporin and a bandage to keep the wound clean.   After the fall she started going to physical therapy. She reports that she feels as though PT is helping but continues to feel unsteady when not using her rolling walker. She has two weeks of PT left and is wondering if she can get some more PT      Review of Systems See HPI   Past Medical History:  Diagnosis Date  . GERD (gastroesophageal reflux disease)   . Hyperlipidemia   . MVP (mitral valve prolapse)   . OP (osteoporosis)   . Pelvis fracture (HCC) 09/2015   CLOSED   . Scoliosis   . Spastic colon     Social History   Social History  . Marital status: Married    Spouse name: N/A  . Number of children: N/A  . Years of education: N/A   Occupational History  . Not on file.   Social History Main Topics  . Smoking status: Never Smoker  . Smokeless tobacco: Never Used  . Alcohol use No  . Drug use: No  . Sexual activity: Not on file   Other Topics Concern  . Not on file   Social History Narrative   1 year of college    Widow   Two daughters        Past Surgical History:  Procedure Laterality Date  . ABDOMINAL HYSTERECTOMY  1965  . APPENDECTOMY    . BILATERAL OOPHORECTOMY  1981  . EYE SURGERY    . ROTATOR CUFF REPAIR Right     Family History  Problem Relation Age of Onset  . Heart attack Mother   . Hypertension Mother   . Heart disease Father   . Hypertension Father   . Stroke Neg Hx     Allergies  Allergen Reactions  . Penicillins     Yeast infections  . Phenothiazines Other (See Comments)    Unknown reaction  .  Tetracyclines & Related Other (See Comments)    Unknown reaction    Current Outpatient Prescriptions on File Prior to Visit  Medication Sig Dispense Refill  . aspirin 81 MG EC tablet Take 81 mg by mouth daily.      . Calcium Carb-Cholecalciferol (CALCIUM 500 +D PO) Take 1 tablet by mouth daily.     . Multiple Vitamin (MULTIVITAMIN) tablet Take 1 tablet by mouth daily.    . nadolol (CORGARD) 80 MG tablet Take 1 tablet (80 mg total) by mouth daily. 90 tablet 3  . omeprazole (PRILOSEC) 20 MG capsule Take 1 capsule (20 mg total) by mouth daily. 30 capsule 11  . simvastatin (ZOCOR) 20 MG tablet Take 1 tablet (20 mg total) by mouth daily. 30 tablet 6   No current facility-administered medications on file prior to visit.     BP 122/72 (BP Location: Left Arm, Patient Position: Sitting, Cuff Size: Small)   Temp 98.3 F (36.8 C) (Oral)   Ht 4\' 10"  (1.473 m)   Wt 83 lb 6.4 oz (37.8 kg)   BMI 17.43 kg/m  Objective:   Physical Exam  Constitutional: She is oriented to person, place, and time. She appears well-developed and well-nourished. No distress.  Cardiovascular: Normal rate, regular rhythm, normal heart sounds and intact distal pulses.  Exam reveals no friction rub.   No murmur heard. Pulmonary/Chest: Effort normal and breath sounds normal. No respiratory distress. She has no wheezes. She has no rales. She exhibits no tenderness.  Musculoskeletal:  Walks with a rolling walker  Neurological: She is alert and oriented to person, place, and time.  Skin: Skin is warm and dry. She is not diaphoretic.  Quarter sized non healing wound on right shin. No localized redness. Trace amount of yellow drainage on bandage.   Nursing note and vitals reviewed.      Assessment & Plan:  1. Non-healing wound of lower extremity, initial encounter - Will prescribe Keflex to help with healing process. She is to keep the area clean and covered during the day. She can remove bandage at night  -  cephALEXin (KEFLEX) 500 MG capsule; Take 1 capsule (500 mg total) by mouth 2 (two) times daily.  Dispense: 20 capsule; Refill: 0 - fluconazole (DIFLUCAN) 150 MG tablet; Take 1 tablet (150 mg total) by mouth once.  Dispense: 1 tablet; Refill: 1 - Follow up if no improvement in one week or sooner with any signs of infection   2. Gait disturbance - I will have her complete current PT and if she needs more I will send her back   [name], NP

## 2017-04-10 NOTE — Patient Instructions (Signed)
It was great seeing you today   I have sent in a prescription for Keflex to help your wound heal. Please keep the wound uncovered at night. Cover with a bandage during the day

## 2017-04-12 ENCOUNTER — Telehealth: Payer: Self-pay | Admitting: Adult Health

## 2017-04-12 NOTE — Telephone Encounter (Signed)
[facility] that [facility], this is okay. Thanks!

## 2017-04-12 NOTE — Telephone Encounter (Signed)
Liji would like to extend pt for physical therapy  twice a wk for 2 wks and then once a wk for 1 wk .

## 2017-04-18 ENCOUNTER — Ambulatory Visit (INDEPENDENT_AMBULATORY_CARE_PROVIDER_SITE_OTHER): Payer: Medicare Other | Admitting: Cardiology

## 2017-04-18 ENCOUNTER — Encounter: Payer: Self-pay | Admitting: *Deleted

## 2017-04-18 ENCOUNTER — Encounter: Payer: Self-pay | Admitting: Cardiology

## 2017-04-18 VITALS — BP 126/72 | HR 67 | Ht <= 58 in | Wt 89.0 lb

## 2017-04-18 DIAGNOSIS — I10 Essential (primary) hypertension: Secondary | ICD-10-CM

## 2017-04-18 DIAGNOSIS — E782 Mixed hyperlipidemia: Secondary | ICD-10-CM

## 2017-04-18 DIAGNOSIS — I272 Pulmonary hypertension, unspecified: Secondary | ICD-10-CM

## 2017-04-18 DIAGNOSIS — I951 Orthostatic hypotension: Secondary | ICD-10-CM | POA: Diagnosis not present

## 2017-04-18 DIAGNOSIS — H35372 Puckering of macula, left eye: Secondary | ICD-10-CM | POA: Insufficient documentation

## 2017-04-18 MED ORDER — NADOLOL 40 MG PO TABS: 40.0000 mg | ORAL_TABLET | Freq: Every day | ORAL | 3 refills | Status: DC

## 2017-04-18 NOTE — Progress Notes (Signed)
Cardiology Office Note    Date:  04/18/2017   ID:  Madison Odom, DOB December 19, 1933, MRN [id number]  PCP:  [name], NP  Cardiologist:  Dr Patty Sermons --> Tobias Alexander, MD   Chief complain: 6 months follow-up  History of Present Illness:  Madison Odom is a 83 y.o. femalewith h/o mitral valve prolapse and history of hypercholesterolemia. She has a history of scoliosis and osteoporosis.She has terrible osteoarthritis and had a fall on 09/21/15 causing closed fracture of pelvis that did not require surgery. She went to a rehabilitation facility but is now home. She had physical therapy at home. She is doing well. She is walking with a rolling walker. She is not having any cardiac symptoms from her mitral valve prolapse.   This is a first-time visit after transitioning from Dr. Patty Sermons to me, she denies any chest pain or shortness of breath no orthopnea, paroxysmal nocturnal dyspnea, no lower extremity edema palpitations or syncope. She is compliant with her meds and has no side effects. She walks up to 4 hours in a mall with her daughter, no falls. She appears very frail. She walks with a cane now, but without a cane at home.   04/18/2017 - this is a 6 months follow-up, the patient continues to have shortness of breath on mild distances, and has fallen twice in the last 6 months. On one occasion she bent down to pick up a newspaper and when she was standing up she fell down. On the other occasion she tripped over curb side. No loss of consciousness. She walks with a walker and completed physical therapy that she enjoyed very much but was advised not to drive that lead to dependence on her relatives and depression as she is unable to do things independently. Her functional capacity slowly decline over last couple years but she still lives independently and would like to do more. She denies any chest pain, no lower extremity edema orthopnea or proximal nocturnal dyspnea. No  palpitations.  Past Medical History:  Diagnosis Date  . GERD (gastroesophageal reflux disease)   . Hyperlipidemia   . MVP (mitral valve prolapse)   . OP (osteoporosis)   . Pelvis fracture (HCC) 09/2015   CLOSED   . Scoliosis   . Spastic colon     Past Surgical History:  Procedure Laterality Date  . ABDOMINAL HYSTERECTOMY  1965  . APPENDECTOMY    . BILATERAL OOPHORECTOMY  1981  . EYE SURGERY    . ROTATOR CUFF REPAIR Right    Current Medications: Outpatient Medications Prior to Visit  Medication Sig Dispense Refill  . aspirin 81 MG EC tablet Take 81 mg by mouth daily.      . Calcium Carb-Cholecalciferol (CALCIUM 500 +D PO) Take 1 tablet by mouth daily.     . cephALEXin (KEFLEX) 500 MG capsule Take 1 capsule (500 mg total) by mouth 2 (two) times daily. 20 capsule 0  . Multiple Vitamin (MULTIVITAMIN) tablet Take 1 tablet by mouth daily.    Marland Kitchen omeprazole (PRILOSEC) 20 MG capsule Take 1 capsule (20 mg total) by mouth daily. 30 capsule 11  . simvastatin (ZOCOR) 20 MG tablet Take 1 tablet (20 mg total) by mouth daily. 30 tablet 6  . nadolol (CORGARD) 80 MG tablet Take 1 tablet (80 mg total) by mouth daily. 90 tablet 3   No facility-administered medications prior to visit.    Allergies:   Phenothiazines; Prochlorperazine; Penicillin v; Penicillins; Tetracycline; and Tetracyclines & related  Social History   Social History  . Marital status: Married    Spouse name: N/A  . Number of children: N/A  . Years of education: N/A   Social History Main Topics  . Smoking status: Never Smoker  . Smokeless tobacco: Never Used  . Alcohol use No  . Drug use: No  . Sexual activity: Not Asked   Other Topics Concern  . None   Social History Narrative   1 year of college    Widow   Two daughters        Family History:  The patient's family history includes Heart attack in her mother; Heart disease in her father; Hypertension in her father and mother.   ROS:   Please see the  history of present illness.    ROS All other systems reviewed and are negative.  PHYSICAL EXAM:   VS:  BP 126/72   Pulse 67   Ht 4\' 10"  (1.473 m)   Wt 89 lb (40.4 kg)   SpO2 97%   BMI 18.60 kg/m    GEN: Well nourished, well developed, in no acute distress  HEENT: normal  Neck: JVD + 6 cm  B/L , carotid bruits, or masses Cardiac: RRR; no murmurs, rubs, or gallops,no edema  Respiratory:  clear to auscultation bilaterally, normal work of breathing GI: soft, nontender, nondistended, + BS MS: no deformity or atrophy  Skin: warm and dry, no rash Neuro:  Alert and Oriented x 3, Strength and sensation are intact Psych: euthymic mood, full affect  Wt Readings from Last 3 Encounters:  04/18/17 89 lb (40.4 kg)  04/10/17 83 lb 6.4 oz (37.8 kg)  01/02/17 85 lb (38.6 kg)    Studies/Labs Reviewed:   Recent Labs: 11/16/2016: ALT 16; BUN 25; Creatinine, Ser 0.53; Hemoglobin 13.5; Platelets 268.0; Potassium 4.4; Sodium 140; TSH 0.90   Lipid Panel    Component Value Date/Time   CHOL 178 11/16/2016 0854   TRIG 96.0 11/16/2016 0854   HDL 69.20 11/16/2016 0854   CHOLHDL 3 11/16/2016 0854   VLDL 19.2 11/16/2016 0854   LDLCALC 89 11/16/2016 0854   TTE: 09/2015 - Left ventricle: The cavity size was normal. Wall thickness was  increased in a pattern of mild LVH. Systolic function was normal.  The estimated ejection fraction was in the range of 60% to 65%.  Wall motion was normal; there were no regional wall motion  abnormalities. Doppler parameters are consistent with abnormal  left ventricular relaxation (grade 1 diastolic dysfunction). - Aortic valve: There was trivial regurgitation. - Mitral valve: Prolapse cannot be excluded. No MR. - Tricuspid valve: There was moderate regurgitation. - Pulmonary arteries: PA peak pressure: 62 mm Hg (S).  EKG performed today 04/18/2017 shows sinus rhythm with sinus arrhythmia and first-degree AV block that is new otherwise unchanged  EKG.   ASSESSMENT:    1. Pulmonary hypertension (HCC)   2. Essential hypertension   3. Orthostatic hypotension   4. Mixed hyperlipidemia     PLAN:  In order of problems listed above:  1. Pulmonary HTN - DOE that is worsening, a year ago she is able to walk with a cane and drive now she walks with a walker and is unable to drive. She also feels more tired. We'll repeat echocardiogram to evaluate LV EF and degree of pulmonary hypertension and potentially refer her to heart failure clinic for management of pulmonary hypertension.  2. Hypertension - now with orthostatic hypotension and falls, her falls could also be  attribute it to severe pulmonary hypertension  3. Hyperlipidemia - on simvastatin - all lipids at goal in Jan 2018   Medication Adjustments/Labs and Tests Ordered: Current medicines are reviewed at length with the patient today.  Concerns regarding medicines are outlined above.  Medication changes, Labs and Tests ordered today are listed in the Patient Instructions below. There are no Patient Instructions on file for this visit. Signed, Tobias AlexanderKatarina Ruba Outen, MD  04/18/2017 10:59 AM    [facility] [address], [place], [place]  [address] Phone: [phone]; Fax: [phone]) [phone]

## 2017-04-18 NOTE — Patient Instructions (Signed)
Medication Instructions:   DECREASE YOUR NADOLOL TO 40 MG ONCE DAILY     Testing/Procedures:  Your physician has requested that you have an echocardiogram. Echocardiography is a painless test that uses sound waves to create images of your heart. It provides your doctor with information about the size and shape of your heart and how well your heart's chambers and valves are working. This procedure takes approximately one hour. There are no restrictions for this procedure.     Follow-Up:  Your physician wants you to follow-up in: 6 MONTHS WITH DR Johnell ComingsNELSON You will receive a reminder letter in the mail two months in advance. If you don't receive a letter, please call our office to schedule the follow-up appointment.        If you need a refill on your cardiac medications before your next appointment, please call your pharmacy.

## 2017-04-19 ENCOUNTER — Telehealth: Payer: Self-pay | Admitting: *Deleted

## 2017-04-19 NOTE — Telephone Encounter (Signed)
04/19/17 Bintou Lafata, MRS.Viele SAID SHE WILL CALL BACK LATER TO SCHEDULE HER ECHO.

## 2017-05-03 ENCOUNTER — Telehealth: Payer: Self-pay | Admitting: Adult Health

## 2017-05-03 NOTE — Telephone Encounter (Signed)
Pt's home health PT ended today.  Pt states the nurse advised she needed further PT.  [facility] Pt states she has been there before and would like Cory to place an order please.

## 2017-05-03 NOTE — Telephone Encounter (Signed)
Is this ok?

## 2017-05-03 NOTE — Telephone Encounter (Signed)
That is fine 

## 2017-05-04 ENCOUNTER — Other Ambulatory Visit: Payer: Self-pay | Admitting: Adult Health

## 2017-05-04 DIAGNOSIS — M549 Dorsalgia, unspecified: Principal | ICD-10-CM

## 2017-05-04 DIAGNOSIS — G8929 Other chronic pain: Secondary | ICD-10-CM

## 2017-05-04 NOTE — Telephone Encounter (Signed)
Referral has been placed. Thanks!

## 2017-05-07 ENCOUNTER — Other Ambulatory Visit: Payer: Self-pay | Admitting: Cardiology

## 2017-05-21 ENCOUNTER — Encounter: Payer: Self-pay | Admitting: Rehabilitative and Restorative Service Providers"

## 2017-05-21 ENCOUNTER — Ambulatory Visit: Payer: Medicare Other | Attending: Adult Health | Admitting: Rehabilitative and Restorative Service Providers"

## 2017-05-21 DIAGNOSIS — M545 Low back pain, unspecified: Secondary | ICD-10-CM

## 2017-05-21 DIAGNOSIS — R293 Abnormal posture: Secondary | ICD-10-CM

## 2017-05-21 DIAGNOSIS — R2689 Other abnormalities of gait and mobility: Secondary | ICD-10-CM | POA: Diagnosis present

## 2017-05-21 DIAGNOSIS — G8929 Other chronic pain: Secondary | ICD-10-CM

## 2017-05-21 DIAGNOSIS — M6281 Muscle weakness (generalized): Secondary | ICD-10-CM | POA: Diagnosis present

## 2017-05-21 NOTE — Therapy (Signed)
[facility] [address] [place], [place], [address] Phone: [phone]   Fax:  [phone]  Physical Therapy Evaluation  Patient Details  Name: Madison Odom MRN: [id number] Date of Birth: February 09, 1934 Referring Provider: [name], NP  Encounter Date: 05/21/2017      PT End of Session - 05/21/17 1211    Visit Number 1   Number of Visits 12   Date for PT Re-Evaluation 07/02/17   PT Start Time 1100   PT Stop Time 1151   PT Time Calculation (min) 51 min   Activity Tolerance Patient tolerated treatment well;No increased pain   Behavior During Therapy WFL for tasks assessed/performed      Past Medical History:  Diagnosis Date  . GERD (gastroesophageal reflux disease)   . Hyperlipidemia   . MVP (mitral valve prolapse)   . OP (osteoporosis)   . Pelvis fracture (HCC) 09/2015   CLOSED   . Scoliosis   . Spastic colon     Past Surgical History:  Procedure Laterality Date  . ABDOMINAL HYSTERECTOMY  1965  . APPENDECTOMY    . BILATERAL OOPHORECTOMY  1981  . EYE SURGERY    . ROTATOR CUFF REPAIR Right     There were no vitals filed for this visit.       Subjective Assessment - 05/21/17 1105    Subjective I have back pain when I stand, walk, and sit for too long; it is mainly in the morning when I get out of bed. I have had 2 recent falls in March 2018.    Pertinent History LBP for several years especially standing in the kitchen. Most recent fall March 2018 picking up paper off of the kitchen floor. Felt like she couldn't walk around grocery store after the fall and then fell again onto daughter amb up curb from store. Denies vertigo. Uses the rollator at home. Lives alone. Has driver.    Limitations Sitting;Lifting;Standing;Walking   How long can you sit comfortably? 30 min   How long can you stand comfortably? 30 min   How long can you walk comfortably? 30 min   Diagnostic tests severe scoliosis;  diagnostic testing March 2018   Patient Stated Goals to be able to walk in the house without any help and use the cane outside   Currently in Pain? Yes   Pain Score 5    Pain Location Back   Pain Orientation Mid   Pain Descriptors / Indicators Aching;Tiring   Pain Type Chronic pain   Pain Onset More than a month ago   Pain Frequency Intermittent   Aggravating Factors  standing in one place, walking longer distance   Pain Relieving Factors rest   Effect of Pain on Daily Activities moderate depending on length of activity   Multiple Pain Sites No            [facility] PT Assessment - 05/21/17 0001      Assessment   Medical Diagnosis LBP, balance, ataxic gait   Referring Provider [name], NP   Onset Date/Surgical Date 01/11/17   Hand Dominance Right   Next MD Visit TBD   Prior Therapy Home Health terminated 2 weeks ago     Precautions   Precautions None     Restrictions   Weight Bearing Restrictions No     Balance Screen   Has the patient fallen in the past 6 months Yes   How many times? --  2x   Has  the patient had a decrease in activity level because of a fear of falling?  Yes   Is the patient reluctant to leave their home because of a fear of falling?  Yes     Home Environment   Living Environment Private residence   Living Arrangements --  has chair lift and walk in tub     Prior Function   Level of Independence Independent with basic ADLs     Cognition   Overall Cognitive Status Within Functional Limits for tasks assessed     Coordination   Gross Motor Movements are Fluid and Coordinated Yes   Fine Motor Movements are Fluid and Coordinated Yes     Posture/Postural Control   Posture/Postural Control Postural limitations   Posture Comments scoliosis, kyphosis, static standing discomfort at L4-5 junction     ROM / Strength   AROM / PROM / Strength AROM;Strength     AROM   Overall AROM Comments Lumbar AROM limited: ext 100%, bil rot 75-80%, bil  sidebending 75%; knee flex/ext WNL     Strength   Overall Strength Comments bil hip flex 3+, knee ext 4+ bil, bil knee flex 3+, bil ankle DF 4-;  hip abdct 3+, hip addct 4-/5, bil hip ext 4-/5; lumbar/core strength poor+     Flexibility   Soft Tissue Assessment /Muscle Length yes   Quadriceps --  hip flexor tightness bil present     Palpation   Spinal mobility hypomobile due to fixed posture     Special Tests    Special Tests --  Sharlene Motts 36/56; tinetti 7/12     Transfers   Five time sit to stand comments  able to perform 6 STS in 10 sec without arm rests     Ambulation/Gait   Ambulation/Gait Yes   Ambulation/Gait Assistance 6: Modified independent (Device/Increase time)   Assistive device 4-wheeled walker   Ambulation Surface Level;Unlevel;Indoor;Outdoor;Other (comment)  straw   Gait Comments pt amb with tandem gait with rollator     Balance   Balance Assessed Yes            Objective measurements completed on examination: See above findings.                  PT Education - 05/21/17 1156    Education provided Yes   Education Details HEP: quad sets x 15, tilt x 15, tilt with march x 15 all 2x/day   Person(s) Educated Patient   Methods Explanation;Demonstration;Handout   Comprehension Verbalized understanding;Returned demonstration          PT Short Term Goals - 05/21/17 1200      PT SHORT TERM GOAL #1   Title indepedent with initial HEP   Baseline issued at eval   Time 3   Period Weeks   Status New     PT SHORT TERM GOAL #2   Title berg balance score >/= 45/56    Baseline 35/56   Time 3   Period Weeks   Status New     PT SHORT TERM GOAL #3   Title Pt will report improved lumbar fatigue/LBP with static standing in kitchen x 25% to prepare a meal   Baseline unmet   Time 3   Period Weeks   Status New           PT Long Term Goals - 05/21/17 1202      PT LONG TERM GOAL #1   Title Pt will demo improved bil LE strength >/=  4+/5 all  major muscle groups to assist with decreased falls risk   Baseline bil hip flex 3+, bil knee ext 4+, bil knee flex 3+, bil DF 4-, bilhip abdct 3+/5, bil hip ext 4-/5   Time 6   Period Weeks   Status New     PT LONG TERM GOAL #2   Title Berg balance score >/= 50/56   Baseline 36/56   Time 6   Period Weeks   Status New     PT LONG TERM GOAL #3   Title Pt will report no LBP/lumbar fatigue with WB activities >/= 45 min   Baseline 30 min   Time 6   Period Weeks   Status New     PT LONG TERM GOAL #4   Title pt will demo improved lumbar/core strength to good + to assist with decreased LBP with amb in grocery store   Baseline poor +   Time 6   Period Weeks   Status New                Plan - May 26, 2017 1157    Clinical Impression Statement Pt presents to PT with complaints of LBP/lumbar fatigue with static standing and long duration ambulation, bil hip flexor tightness, decreased balance, decreased bil LE strength all resulting in difficulty in walking and activities in WB. Pt would benefit from PT for lumbar/core/LE strengthening, balance training, and GT to increase function and decrease risk of falling (pt has had 2 recent falls).   History and Personal Factors relevant to plan of care: osteoporosis, R RCR   Clinical Presentation Stable   Clinical Decision Making Low   Rehab Potential Excellent   Clinical Impairments Affecting Rehab Potential osteoporosis, R RCR   PT Frequency 2x / week   PT Duration 6 weeks   PT Treatment/Interventions ADLs/Self Care Home Management;Functional mobility training;Stair training;Gait training;DME Instruction;Moist Heat;Therapeutic activities;Therapeutic exercise;Balance training;Neuromuscular re-education;Patient/family education;Manual techniques   PT Next Visit Plan review HEP, continue to progress core/lumbar strengthening, balance deficit   PT Home Exercise Plan see pt education section   Consulted and Agree with Plan of Care Patient       Patient will benefit from skilled therapeutic intervention in order to improve the following deficits and impairments:  Abnormal gait, Decreased activity tolerance, Decreased balance, Decreased mobility, Decreased endurance, Decreased range of motion, Decreased strength, Difficulty walking, Impaired flexibility, Postural dysfunction, Improper body mechanics, Pain  Visit Diagnosis: Muscle weakness (generalized)  Abnormal posture  Other abnormalities of gait and mobility  Chronic midline low back pain without sciatica      G-Codes - May 26, 2017 1213    Functional Assessment Tool Used (Outpatient Only) Berg 36/56   Functional Limitation Mobility: Walking and moving around   Mobility: Walking and Moving Around Current Status (Z6109) At least 20 percent but less than 40 percent impaired, limited or restricted   Mobility: Walking and Moving Around Goal Status (U0454) At least 1 percent but less than 20 percent impaired, limited or restricted       Problem List Patient Active Problem List   Diagnosis Date Noted  . Epiretinal membrane, left eye 04/18/2017  . Acute blood loss anemia 09/27/2015  . Pelvic fracture (HCC) 09/21/2015  . Closed fracture of pelvis (HCC)   . Hypertensive retinopathy of both eyes 08/16/2015  . Stye external 02/23/2015  . Hypertension 01/28/2014  . Kyphoscoliosis 09/16/2013  . After cataract 02/19/2013  . Pseudophakia of both eyes 01/22/2013  . Posterior capsular opacification 10/29/2012  .  Cystoid macular edema 04/30/2012  . Epiretinal membrane 04/30/2012  . MVP (mitral valve prolapse) 05/24/2011  . Hypercholesterolemia 05/24/2011    ARTIS,Shamari Trostel, PT 05/21/2017, 12:17 PM  [facility] [address] [place], [place], [address] Phone: [phone]   Fax:  [phone]  Name: Madison Odom MRN: [id number] Date of Birth: 1934/11/02

## 2017-05-22 ENCOUNTER — Other Ambulatory Visit: Payer: Self-pay | Admitting: Cardiology

## 2017-05-24 ENCOUNTER — Ambulatory Visit: Payer: Medicare Other | Admitting: Rehabilitative and Restorative Service Providers"

## 2017-05-24 DIAGNOSIS — M6281 Muscle weakness (generalized): Secondary | ICD-10-CM

## 2017-05-24 DIAGNOSIS — R2689 Other abnormalities of gait and mobility: Secondary | ICD-10-CM

## 2017-05-24 DIAGNOSIS — M545 Low back pain, unspecified: Secondary | ICD-10-CM

## 2017-05-24 DIAGNOSIS — G8929 Other chronic pain: Secondary | ICD-10-CM

## 2017-05-24 DIAGNOSIS — R293 Abnormal posture: Secondary | ICD-10-CM

## 2017-05-24 NOTE — Therapy (Signed)
[facility] [address] [place], [place], [address] Phone: [phone]   Fax:  [phone]  Physical Therapy Treatment  Patient Details  Name: Madison Odom MRN: [id number] Date of Birth: July 06, 1934 Referring Provider: [name], NP  Encounter Date: 05/24/2017      PT End of Session - 05/24/17 1424    Visit Number 2   Number of Visits 12   Date for PT Re-Evaluation 07/02/17   PT Start Time 0202   PT Stop Time 0248   PT Time Calculation (min) 46 min   Activity Tolerance Patient tolerated treatment well;No increased pain   Behavior During Therapy WFL for tasks assessed/performed      Past Medical History:  Diagnosis Date  . GERD (gastroesophageal reflux disease)   . Hyperlipidemia   . MVP (mitral valve prolapse)   . OP (osteoporosis)   . Pelvis fracture (HCC) 09/2015   CLOSED   . Scoliosis   . Spastic colon     Past Surgical History:  Procedure Laterality Date  . ABDOMINAL HYSTERECTOMY  1965  . APPENDECTOMY    . BILATERAL OOPHORECTOMY  1981  . EYE SURGERY    . ROTATOR CUFF REPAIR Right     There were no vitals filed for this visit.      Subjective Assessment - 05/24/17 1404    Subjective I walked 1.5 miles yesterday and stood last night for a while in the kitchen. My back didn't hurt. It was just tired.    Pertinent History LBP for several years especially standing in the kitchen. Most recent fall March 2018 picking up paper off of the kitchen floor. Felt like she couldn't walk around grocery store after the fall and then fell again onto daughter amb up curb from store. Denies vertigo. Uses the rollator at home. Lives alone. Has driver.    Limitations Sitting;Lifting;Standing;Walking   How long can you sit comfortably? 30 min   How long can you stand comfortably? 30 min   How long can you walk comfortably? 30 min   Diagnostic tests severe scoliosis; diagnostic testing March 2018   Patient  Stated Goals to be able to walk in the house without any help and use the cane outside   Currently in Pain? No/denies                         [facility] Adult PT Treatment/Exercise - 05/24/17 0001      Lumbar Exercises: Aerobic   Stationary Bike NuStep seat 7 level 1 x 5 min with PT verbal cues for quad contraction.     Lumbar Exercises: Standing   Other Standing Lumbar Exercises at countertop:mini squat x 10, glute set with hip abdt x 15; glute set with hip ext x 15; glute set with hip abdct/circles combo clockwise/counterclockwise x 15 each; sit to stands from edge of mat with and without UEs x 10 with concentration on eccentric descent; standing reaches various directions x 2 min with PT CGA for safety with 2 LOBs; hip flex/abdct combo without UE A x 15 each direction with pt significant difficulty with R SLS and needing CGA to min A to remain standing     Lumbar Exercises: Supine   Other Supine Lumbar Exercises tilt x 15, tilt with march x 15; tilt with clam shell x 15, tilt with ball squeeze x 20;      Knee/Hip Exercises: Stretches   Other Knee/Hip Stretches quad sets  x 15, SLR x 15, SLR/hip abdct combo x 15                  PT Short Term Goals - 05/21/17 1200      PT SHORT TERM GOAL #1   Title indepedent with initial HEP   Baseline issued at eval   Time 3   Period Weeks   Status New     PT SHORT TERM GOAL #2   Title berg balance score >/= 45/56    Baseline 35/56   Time 3   Period Weeks   Status New     PT SHORT TERM GOAL #3   Title Pt will report improved lumbar fatigue/LBP with static standing in kitchen x 25% to prepare a meal   Baseline unmet   Time 3   Period Weeks   Status New           PT Long Term Goals - 05/21/17 1202      PT LONG TERM GOAL #1   Title Pt will demo improved bil LE strength >/= 4+/5 all major muscle groups to assist with decreased falls risk   Baseline bil hip flex 3+, bil knee ext 4+, bil knee flex 3+, bil DF 4-,  bilhip abdct 3+/5, bil hip ext 4-/5   Time 6   Period Weeks   Status New     PT LONG TERM GOAL #2   Title Berg balance score >/= 50/56   Baseline 36/56   Time 6   Period Weeks   Status New     PT LONG TERM GOAL #3   Title Pt will report no LBP/lumbar fatigue with WB activities >/= 45 min   Baseline 30 min   Time 6   Period Weeks   Status New     PT LONG TERM GOAL #4   Title pt will demo improved lumbar/core strength to good + to assist with decreased LBP with amb in grocery store   Baseline poor +   Time 6   Period Weeks   Status New               Plan - 05/24/17 1407    Clinical Impression Statement Pt reports no LBP, only lumbar fatigue since evaluation with long duration ambulation and standing. Pt would continue to benefit from lumbar/core/LE strengthening, balance training, and GT to improve mobility and decrease falls risk.. Pt needs max verbal cues to decrease cadence and control quality of movement with minimal carryover for said instruction.   Rehab Potential Excellent   Clinical Impairments Affecting Rehab Potential osteoporosis, R RCR   PT Frequency 2x / week   PT Duration 6 weeks   PT Treatment/Interventions ADLs/Self Care Home Management;Functional mobility training;Stair training;Gait training;DME Instruction;Moist Heat;Therapeutic activities;Therapeutic exercise;Balance training;Neuromuscular re-education;Patient/family education;Manual techniques   PT Next Visit Plan continue to progress core/lumbar/LE strengthening and balance exercises   Consulted and Agree with Plan of Care Patient      Patient will benefit from skilled therapeutic intervention in order to improve the following deficits and impairments:  Abnormal gait, Decreased activity tolerance, Decreased balance, Decreased mobility, Decreased endurance, Decreased range of motion, Decreased strength, Difficulty walking, Impaired flexibility, Postural dysfunction, Improper body mechanics,  Pain  Visit Diagnosis: Abnormal posture  Muscle weakness (generalized)  Other abnormalities of gait and mobility  Chronic midline low back pain without sciatica     Problem List Patient Active Problem List   Diagnosis Date Noted  . Epiretinal membrane, left  eye 04/18/2017  . Acute blood loss anemia 09/27/2015  . Pelvic fracture (HCC) 09/21/2015  . Closed fracture of pelvis (HCC)   . Hypertensive retinopathy of both eyes 08/16/2015  . Stye external 02/23/2015  . Hypertension 01/28/2014  . Kyphoscoliosis 09/16/2013  . After cataract 02/19/2013  . Pseudophakia of both eyes 01/22/2013  . Posterior capsular opacification 10/29/2012  . Cystoid macular edema 04/30/2012  . Epiretinal membrane 04/30/2012  . MVP (mitral valve prolapse) 05/24/2011  . Hypercholesterolemia 05/24/2011    ARTIS,Rissa Turley, PT 05/24/2017, 2:53 PM  [facility] [address], [address] [place], [place], [address] Phone: [phone]   Fax:  [phone]  Name: Madison Odom MRN: [id number] Date of Birth: 12-25-1933

## 2017-05-29 ENCOUNTER — Encounter: Payer: Self-pay | Admitting: Cardiology

## 2017-05-30 ENCOUNTER — Ambulatory Visit: Payer: Medicare Other

## 2017-05-30 DIAGNOSIS — R293 Abnormal posture: Secondary | ICD-10-CM

## 2017-05-30 DIAGNOSIS — G8929 Other chronic pain: Secondary | ICD-10-CM

## 2017-05-30 DIAGNOSIS — M545 Low back pain, unspecified: Secondary | ICD-10-CM

## 2017-05-30 DIAGNOSIS — M6281 Muscle weakness (generalized): Secondary | ICD-10-CM | POA: Diagnosis not present

## 2017-05-30 DIAGNOSIS — R2689 Other abnormalities of gait and mobility: Secondary | ICD-10-CM

## 2017-05-30 NOTE — Therapy (Signed)
[facility] [address], [address] [place], [place], [address] Phone: [phone]   Fax:  [phone]  Physical Therapy Treatment  Patient Details  Name: Madison Odom MRN: [id number] Date of Birth: Jun 20, 1934 Referring Provider: [name], NP  Encounter Date: 05/30/2017      PT End of Session - 05/30/17 1152    Visit Number 3   Number of Visits 12   Date for PT Re-Evaluation 07/02/17   PT Start Time 1145   PT Stop Time 1230   PT Time Calculation (min) 45 min   Activity Tolerance Patient tolerated treatment well;No increased pain   Behavior During Therapy WFL for tasks assessed/performed      Past Medical History:  Diagnosis Date  . GERD (gastroesophageal reflux disease)   . Hyperlipidemia   . MVP (mitral valve prolapse)   . OP (osteoporosis)   . Pelvis fracture (HCC) 09/2015   CLOSED   . Scoliosis   . Spastic colon     Past Surgical History:  Procedure Laterality Date  . ABDOMINAL HYSTERECTOMY  1965  . APPENDECTOMY    . BILATERAL OOPHORECTOMY  1981  . EYE SURGERY    . ROTATOR CUFF REPAIR Right     There were no vitals filed for this visit.      Subjective Assessment - 05/30/17 1353    Subjective Pt. doing well today noting HEP going well.     Patient Stated Goals to be able to walk in the house without any help and use the cane outside   Currently in Pain? No/denies   Pain Score 0-No pain   Multiple Pain Sites No                         [facility] Adult PT Treatment/Exercise - 05/30/17 1342      Transfers   Transfers Sit to Stand;Stand to [employer] Requiring cueing for hand placement and walker brakes x 1     Neuro Re-ed    Neuro Re-ed Details  side-stepping with therapist 3 x 40 ft     Lumbar Exercises: Supine   Clam 15 reps   Clam Limitations with red TB around knees; cueing for abdom. bracing    Bridge 10 reps;3 seconds   Bridge Limitations with sustained red  TB hip abd/ER    Other Supine Lumbar Exercises Bridge with adduction ball squeeze x 10 reps     Knee/Hip Exercises: Aerobic   Nustep NuStep: lvl 2 , 8 min      Knee/Hip Exercises: Standing   Heel Raises 2 sets;15 reps;3 seconds   Heel Raises Limitations at counter    Hip Abduction Right;Left;10 reps;Knee straight   Functional Squat 15 reps   Functional Squat Limitations mini squats at counter    Other Standing Knee Exercises Alternating forward, lateral toe-clears to 8" step x 15 reps each way; 2 HH support from therapist     Knee/Hip Exercises: Seated   Sit to Sand 10 reps;with UE support                PT Education - 05/30/17 1355    Education provided Yes   Education Details marching with red TB issued to pt., clam shell with red TB, birdge with hip abd/ER iwth red TB, bridge with adduction ball squeeze    Person(s) Educated Patient   Methods Explanation;Demonstration;Verbal cues;Handout   Comprehension Verbalized understanding;Returned demonstration;Verbal cues required;Need further instruction  PT Short Term Goals - 05/30/17 1153      PT SHORT TERM GOAL #1   Title indepedent with initial HEP   Baseline issued at eval   Time 3   Period Weeks   Status On-going     PT SHORT TERM GOAL #2   Title berg balance score >/= 45/56    Baseline 35/56   Time 3   Period Weeks   Status On-going     PT SHORT TERM GOAL #3   Title Pt will report improved lumbar fatigue/LBP with static standing in kitchen x 25% to prepare a meal   Baseline unmet   Time 3   Period Weeks   Status On-going           PT Long Term Goals - 05/30/17 1153      PT LONG TERM GOAL #1   Title Pt will demo improved bil LE strength >/= 4+/5 all major muscle groups to assist with decreased falls risk   Baseline bil hip flex 3+, bil knee ext 4+, bil knee flex 3+, bil DF 4-, bilhip abdct 3+/5, bil hip ext 4-/5   Time 6   Period Weeks   Status On-going     PT LONG TERM GOAL #2    Title Berg balance score >/= 50/56   Baseline 36/56   Time 6   Period Weeks   Status On-going     PT LONG TERM GOAL #3   Title Pt will report no LBP/lumbar fatigue with WB activities >/= 45 min   Baseline 30 min   Time 6   Period Weeks   Status On-going     PT LONG TERM GOAL #4   Title pt will demo improved lumbar/core strength to good + to assist with decreased LBP with amb in grocery store   Baseline poor +   Time 6   Period Weeks   Status On-going               Plan - 05/30/17 1156    Clinical Impression Statement Madison Odom doing well today.  Some cueing for proper hand placement and walker brakes for safe transfers with cueing required for proper technique today.  Focused on standing balance and LE strengthening today upon pt. request.  Pt. stating, "I don't know why we have been working on my back, I need to work on my balance".  HEP updated with hip strengthening activities.  Tolerated all activities today well.     PT Treatment/Interventions ADLs/Self Care Home Management;Functional mobility training;Stair training;Gait training;DME Instruction;Moist Heat;Therapeutic activities;Therapeutic exercise;Balance training;Neuromuscular re-education;Patient/family education;Manual techniques   PT Next Visit Plan continue to progress core/lumbar/LE strengthening and balance exercises      Patient will benefit from skilled therapeutic intervention in order to improve the following deficits and impairments:  Abnormal gait, Decreased activity tolerance, Decreased balance, Decreased mobility, Decreased endurance, Decreased range of motion, Decreased strength, Difficulty walking, Impaired flexibility, Postural dysfunction, Improper body mechanics, Pain  Visit Diagnosis: Abnormal posture  Muscle weakness (generalized)  Other abnormalities of gait and mobility  Chronic midline low back pain without sciatica     Problem List Patient Active Problem List   Diagnosis Date Noted  .  Epiretinal membrane, left eye 04/18/2017  . Acute blood loss anemia 09/27/2015  . Pelvic fracture (HCC) 09/21/2015  . Closed fracture of pelvis (HCC)   . Hypertensive retinopathy of both eyes 08/16/2015  . Stye external 02/23/2015  . Hypertension 01/28/2014  . Kyphoscoliosis 09/16/2013  .  After cataract 02/19/2013  . Pseudophakia of both eyes 01/22/2013  . Posterior capsular opacification 10/29/2012  . Cystoid macular edema 04/30/2012  . Epiretinal membrane 04/30/2012  . MVP (mitral valve prolapse) 05/24/2011  . Hypercholesterolemia 05/24/2011    Kermit BaloMicah Terez Freimark, PTA 05/30/17 2:01 PM  [facility] [address], [address] [place], [place], [address] Phone: [phone]   Fax:  [phone]  Name: Madison Odom MRN: [id number] Date of Birth: 29-Feb-1934

## 2017-06-04 ENCOUNTER — Encounter: Payer: Self-pay | Admitting: Physical Therapy

## 2017-06-04 ENCOUNTER — Ambulatory Visit: Payer: Medicare Other | Admitting: Physical Therapy

## 2017-06-04 DIAGNOSIS — M6281 Muscle weakness (generalized): Secondary | ICD-10-CM | POA: Diagnosis not present

## 2017-06-04 DIAGNOSIS — G8929 Other chronic pain: Secondary | ICD-10-CM

## 2017-06-04 DIAGNOSIS — M545 Low back pain: Secondary | ICD-10-CM

## 2017-06-04 DIAGNOSIS — R293 Abnormal posture: Secondary | ICD-10-CM

## 2017-06-04 DIAGNOSIS — R2689 Other abnormalities of gait and mobility: Secondary | ICD-10-CM

## 2017-06-04 NOTE — Therapy (Signed)
[facility] [address] [place], [place], [address] Phone: [phone]   Fax:  [phone]  Physical Therapy Treatment  Patient Details  Name: Madison Odom MRN: [id number] Date of Birth: 1933-12-17 Referring Provider: [name], NP  Encounter Date: 06/04/2017      PT End of Session - 06/04/17 1235    Visit Number 4   Number of Visits 12   Date for PT Re-Evaluation 07/02/17   PT Start Time 1230   PT Stop Time 1315   PT Time Calculation (min) 45 min   Activity Tolerance Patient tolerated treatment well;No increased pain   Behavior During Therapy WFL for tasks assessed/performed      Past Medical History:  Diagnosis Date  . GERD (gastroesophageal reflux disease)   . Hyperlipidemia   . MVP (mitral valve prolapse)   . OP (osteoporosis)   . Pelvis fracture (HCC) 09/2015   CLOSED   . Scoliosis   . Spastic colon     Past Surgical History:  Procedure Laterality Date  . ABDOMINAL HYSTERECTOMY  1965  . APPENDECTOMY    . BILATERAL OOPHORECTOMY  1981  . EYE SURGERY    . ROTATOR CUFF REPAIR Right     There were no vitals filed for this visit.      Subjective Assessment - 06/04/17 1234    Subjective Pt denies pain.  Pt reports maybe one time that she has lost her balance but didn't fall since previous visit.   Limitations Sitting;Lifting;Standing;Walking   Diagnostic tests severe scoliosis; diagnostic testing March 2018   Patient Stated Goals to be able to walk in the house without any help and use the cane outside   Currently in Pain? No/denies                         [facility] Adult PT Treatment/Exercise - 06/04/17 0001      Neuro Re-ed    Neuro Re-ed Details  standing tandem and narrow base of support with head turns, side stepping at counter 3x down and back     Lumbar Exercises: Supine   Clam 20 reps  bilateral   Clam Limitations with red TB around knees; cueing for abdom.  bracing    Bent Knee Raise 20 reps  red TB aorund knees   Bridge 10 reps;3 seconds   Bridge Limitations with sustained red TB hip abd/ER    Other Supine Lumbar Exercises adduction ball squeeze x 10 reps 3 sec holds     Knee/Hip Exercises: Aerobic   Nustep NuStep: lvl 1 , 6 min      Knee/Hip Exercises: Standing   Heel Raises 2 sets;15 reps;3 seconds   Hip Abduction Right;Left;Knee straight;15 reps   Hip Extension Right;Left;15 reps;Knee straight   Lateral Step Up Right;Left;10 reps;Hand Hold: 1;Step Height: 6"   Functional Squat 15 reps   Functional Squat Limitations mini squats at counter    Other Standing Knee Exercises Alternating forward, lateral toe-clears to 8" step x 15 reps each way; 2 HH support from therapist     Knee/Hip Exercises: Seated   Sit to Sand 3 sets;5 reps  with yellow band abduction                  PT Short Term Goals - 06/04/17 1235      PT SHORT TERM GOAL #1   Title indepedent with initial HEP   Baseline issued at eval  Time 3   Period Weeks   Status Achieved     PT SHORT TERM GOAL #2   Title berg balance score >/= 45/56    Time 3   Period Weeks   Status On-going     PT SHORT TERM GOAL #3   Title Pt will report improved lumbar fatigue/LBP with static standing in kitchen x 25% to prepare a meal   Time 3   Period Weeks   Status Achieved           PT Long Term Goals - 05/30/17 1153      PT LONG TERM GOAL #1   Title Pt will demo improved bil LE strength >/= 4+/5 all major muscle groups to assist with decreased falls risk   Baseline bil hip flex 3+, bil knee ext 4+, bil knee flex 3+, bil DF 4-, bilhip abdct 3+/5, bil hip ext 4-/5   Time 6   Period Weeks   Status On-going     PT LONG TERM GOAL #2   Title Berg balance score >/= 50/56   Baseline 36/56   Time 6   Period Weeks   Status On-going     PT LONG TERM GOAL #3   Title Pt will report no LBP/lumbar fatigue with WB activities >/= 45 min   Baseline 30 min   Time 6    Period Weeks   Status On-going     PT LONG TERM GOAL #4   Title pt will demo improved lumbar/core strength to good + to assist with decreased LBP with amb in grocery store   Baseline poor +   Time 6   Period Weeks   Status On-going               Plan - 06/04/17 1238    Clinical Impression Statement Patient did well without any increased pain today.  She was asble to progress balance exercises with head turns and had more difficulty with that.  Pt needs skilled PT for improved strength and balance.   Clinical Impairments Affecting Rehab Potential osteoporosis, R RCR   PT Treatment/Interventions ADLs/Self Care Home Management;Functional mobility training;Stair training;Gait training;DME Instruction;Moist Heat;Therapeutic activities;Therapeutic exercise;Balance training;Neuromuscular re-education;Patient/family education;Manual techniques   PT Next Visit Plan continue to progress core/lumbar/LE strengthening and balance exercises   Consulted and Agree with Plan of Care Patient      Patient will benefit from skilled therapeutic intervention in order to improve the following deficits and impairments:  Abnormal gait, Decreased activity tolerance, Decreased balance, Decreased mobility, Decreased endurance, Decreased range of motion, Decreased strength, Difficulty walking, Impaired flexibility, Postural dysfunction, Improper body mechanics, Pain  Visit Diagnosis: Abnormal posture  Muscle weakness (generalized)  Other abnormalities of gait and mobility  Chronic midline low back pain without sciatica     Problem List Patient Active Problem List   Diagnosis Date Noted  . Epiretinal membrane, left eye 04/18/2017  . Acute blood loss anemia 09/27/2015  . Pelvic fracture (HCC) 09/21/2015  . Closed fracture of pelvis (HCC)   . Hypertensive retinopathy of both eyes 08/16/2015  . Stye external 02/23/2015  . Hypertension 01/28/2014  . Kyphoscoliosis 09/16/2013  . After cataract  02/19/2013  . Pseudophakia of both eyes 01/22/2013  . Posterior capsular opacification 10/29/2012  . Cystoid macular edema 04/30/2012  . Epiretinal membrane 04/30/2012  . MVP (mitral valve prolapse) 05/24/2011  . Hypercholesterolemia 05/24/2011    Vincente PoliJakki Crosser, PT 06/04/2017, 1:18 PM  [facility] [address], [address] [place], [place], [address] Phone: [phone]   Fax:  [phone]  Name: Madison Odom MRN: [id number] Date of Birth: 1934-01-21

## 2017-06-06 ENCOUNTER — Ambulatory Visit: Payer: Medicare Other

## 2017-06-06 DIAGNOSIS — M545 Low back pain: Secondary | ICD-10-CM

## 2017-06-06 DIAGNOSIS — R2689 Other abnormalities of gait and mobility: Secondary | ICD-10-CM

## 2017-06-06 DIAGNOSIS — M6281 Muscle weakness (generalized): Secondary | ICD-10-CM

## 2017-06-06 DIAGNOSIS — R293 Abnormal posture: Secondary | ICD-10-CM

## 2017-06-06 DIAGNOSIS — G8929 Other chronic pain: Secondary | ICD-10-CM

## 2017-06-06 NOTE — Therapy (Signed)
[facility] [address] [place], [place], [address] Phone: [phone]   Fax:  [phone]  Physical Therapy Treatment  Patient Details  Name: Madison Odom MRN: [id number] Date of Birth: 08/29/1934 Referring Provider: Shirline Freesory Nafziger, NP  Encounter Date: 06/06/2017      PT End of Session - 06/06/17 1146    Visit Number 5   Number of Visits 12   Date for PT Re-Evaluation 07/02/17   PT Start Time 1142   PT Stop Time 1232   PT Time Calculation (min) 50 min   Activity Tolerance Patient tolerated treatment well;No increased pain   Behavior During Therapy WFL for tasks assessed/performed      Past Medical History:  Diagnosis Date  . GERD (gastroesophageal reflux disease)   . Hyperlipidemia   . MVP (mitral valve prolapse)   . OP (osteoporosis)   . Pelvis fracture (HCC) 09/2015   CLOSED   . Scoliosis   . Spastic colon     Past Surgical History:  Procedure Laterality Date  . ABDOMINAL HYSTERECTOMY  1965  . APPENDECTOMY    . BILATERAL OOPHORECTOMY  1981  . EYE SURGERY    . ROTATOR CUFF REPAIR Right     There were no vitals filed for this visit.      Subjective Assessment - 06/06/17 1144    Subjective Pt. doing well with HEP still challanging.     Patient Stated Goals to be able to walk in the house without any help and use the cane outside   Currently in Pain? Yes   Pain Score 3    Pain Location Back   Pain Orientation Mid   Pain Descriptors / Indicators Aching   Pain Type Chronic pain   Multiple Pain Sites No                         [facility] Adult PT Treatment/Exercise - 06/06/17 1157      Ambulation/Gait   Ambulation/Gait Yes   Ambulation/Gait Assistance 6: Modified independent (Device/Increase time)   Ambulation Distance (Feet) 200 Feet   Assistive device 4-wheeled walker   Gait Comments Working on increased step length with red TB on rollator legs for cueing, working on  upright posture and walking spacing from BOS; Some carryover fol     Lumbar Exercises: Standing   Row Strengthening;20 reps;Theraband   Theraband Level (Row) Level 2 (Red)   Row Limitations B staggered stance  CGA from therapist      Knee/Hip Exercises: Aerobic   Nustep NuStep: lvl 3 , 6 min      Knee/Hip Exercises: Standing   Knee Flexion Right;Left;15 reps   Knee Flexion Limitations 1#; at counter    Hip Flexion Right;Left;20 reps;Knee bent   Hip Flexion Limitations standing at counter    Hip Abduction Right;Left;Knee straight;15 reps   Hip Extension Right;Left;15 reps;Knee straight   Functional Squat 15 reps   Functional Squat Limitations mini squats at counter                 PT Education - 06/06/17 1229    Education provided Yes   Education Details row with red band issued to pt., squat at [employer]   Person(s) Educated Patient   Methods Explanation;Demonstration;Verbal cues;Handout   Comprehension Verbalized understanding;Returned demonstration;Verbal cues required;Need further instruction          PT Short Term Goals - 06/04/17 1235      PT  SHORT TERM GOAL #1   Title indepedent with initial HEP   Baseline issued at eval   Time 3   Period Weeks   Status Achieved     PT SHORT TERM GOAL #2   Title berg balance score >/= 45/56    Time 3   Period Weeks   Status On-going     PT SHORT TERM GOAL #3   Title Pt will report improved lumbar fatigue/LBP with static standing in kitchen x 25% to prepare a meal   Time 3   Period Weeks   Status Achieved           PT Long Term Goals - 05/30/17 1153      PT LONG TERM GOAL #1   Title Pt will demo improved bil LE strength >/= 4+/5 all major muscle groups to assist with decreased falls risk   Baseline bil hip flex 3+, bil knee ext 4+, bil knee flex 3+, bil DF 4-, bilhip abdct 3+/5, bil hip ext 4-/5   Time 6   Period Weeks   Status On-going     PT LONG TERM GOAL #2   Title Berg balance score >/= 50/56    Baseline 36/56   Time 6   Period Weeks   Status On-going     PT LONG TERM GOAL #3   Title Pt will report no LBP/lumbar fatigue with WB activities >/= 45 min   Baseline 30 min   Time 6   Period Weeks   Status On-going     PT LONG TERM GOAL #4   Title pt will demo improved lumbar/core strength to good + to assist with decreased LBP with amb in grocery store   Baseline poor +   Time 6   Period Weeks   Status On-going               Plan - 06/06/17 1147    Clinical Impression Statement Pt. requesting to work on more postural activities today thus treatment focusing on scapular strengthening and continued LE strengthening and balance.  Some time spent to improve posture and increase step length with rolator.  Pt. tolerated all activities well.  HEP updated to include functional squat and band row.  Pt. seems to be progressing well at this point.   PT Treatment/Interventions ADLs/Self Care Home Management;Functional mobility training;Stair training;Gait training;DME Instruction;Moist Heat;Therapeutic activities;Therapeutic exercise;Balance training;Neuromuscular re-education;Patient/family education;Manual techniques   PT Next Visit Plan continue to progress core/lumbar/LE strengthening and balance exercises        Patient will benefit from skilled therapeutic intervention in order to improve the following deficits and impairments:  Abnormal gait, Decreased activity tolerance, Decreased balance, Decreased mobility, Decreased endurance, Decreased range of motion, Decreased strength, Difficulty walking, Impaired flexibility, Postural dysfunction, Improper body mechanics, Pain  Visit Diagnosis: Abnormal posture  Muscle weakness (generalized)  Other abnormalities of gait and mobility  Chronic midline low back pain without sciatica     Problem List Patient Active Problem List   Diagnosis Date Noted  . Epiretinal membrane, left eye 04/18/2017  . Acute blood loss anemia  09/27/2015  . Pelvic fracture (HCC) 09/21/2015  . Closed fracture of pelvis (HCC)   . Hypertensive retinopathy of both eyes 08/16/2015  . Stye external 02/23/2015  . Hypertension 01/28/2014  . Kyphoscoliosis 09/16/2013  . After cataract 02/19/2013  . Pseudophakia of both eyes 01/22/2013  . Posterior capsular opacification 10/29/2012  . Cystoid macular edema 04/30/2012  . Epiretinal membrane 04/30/2012  . MVP (mitral  valve prolapse) 05/24/2011  . Hypercholesterolemia 05/24/2011    Kermit Balo, PTA 06/06/17 1:37 PM   [facility] [address], [address] [place], [place], [address] Phone: [phone]   Fax:  [phone]  Name: Madison Odom MRN: [id number] Date of Birth: 1934-07-21

## 2017-06-11 ENCOUNTER — Ambulatory Visit: Payer: Medicare Other | Admitting: Physical Therapy

## 2017-06-11 ENCOUNTER — Encounter: Payer: Self-pay | Admitting: Physical Therapy

## 2017-06-11 DIAGNOSIS — M545 Low back pain, unspecified: Secondary | ICD-10-CM

## 2017-06-11 DIAGNOSIS — G8929 Other chronic pain: Secondary | ICD-10-CM

## 2017-06-11 DIAGNOSIS — M6281 Muscle weakness (generalized): Secondary | ICD-10-CM

## 2017-06-11 DIAGNOSIS — R293 Abnormal posture: Secondary | ICD-10-CM

## 2017-06-11 DIAGNOSIS — R2689 Other abnormalities of gait and mobility: Secondary | ICD-10-CM

## 2017-06-11 NOTE — Therapy (Signed)
[facility] [address], [address] [place], [place], [address] Phone: [phone]   Fax:  [phone]  Physical Therapy Treatment  Patient Details  Name: Madison Odom MRN: [id number] Date of Birth: July 26, 1934 Referring Provider: Shirline Freesory Nafziger, NP  Encounter Date: 06/11/2017      PT End of Session - 06/11/17 1143    Visit Number 6   Number of Visits 12   Date for PT Re-Evaluation 07/02/17   PT Start Time 1143   PT Stop Time 1225   PT Time Calculation (min) 42 min   Activity Tolerance Patient tolerated treatment well;No increased pain   Behavior During Therapy WFL for tasks assessed/performed      Past Medical History:  Diagnosis Date  . GERD (gastroesophageal reflux disease)   . Hyperlipidemia   . MVP (mitral valve prolapse)   . OP (osteoporosis)   . Pelvis fracture (HCC) 09/2015   CLOSED   . Scoliosis   . Spastic colon     Past Surgical History:  Procedure Laterality Date  . ABDOMINAL HYSTERECTOMY  1965  . APPENDECTOMY    . BILATERAL OOPHORECTOMY  1981  . EYE SURGERY    . ROTATOR CUFF REPAIR Right     There were no vitals filed for this visit.      Subjective Assessment - 06/11/17 1146    Subjective I am feeling a little weak and shake this morning.   Pertinent History LBP for several years especially standing in the kitchen. Most recent fall March 2018 picking up paper off of the kitchen floor. Felt like she couldn't walk around grocery store after the fall and then fell again onto daughter amb up curb from store. Denies vertigo. Uses the rollator at home. Lives alone. Has driver.    Limitations Sitting;Lifting;Standing;Walking   Patient Stated Goals to be able to walk in the house without any help and use the cane outside   Currently in Pain? Yes   Pain Location Arm   Pain Orientation Left;Lower   Pain Descriptors / Indicators Sore   Pain Type Acute pain   Pain Onset In the past 7 days   Pain  Frequency Intermittent   Aggravating Factors  nothing, just scraped it reaching into a box   Multiple Pain Sites No                         [facility] Adult PT Treatment/Exercise - 06/11/17 0001      Knee/Hip Exercises: Aerobic   Nustep NuStep: lvl 1-2 , 6 min      Knee/Hip Exercises: Standing   Heel Raises 2 sets;15 reps;3 seconds   Knee Flexion Right;Left;15 reps   Hip Flexion Right;Left;20 reps;Knee bent   Hip Abduction Right;Left;Knee straight;15 reps   Hip Extension Right;Left;15 reps;Knee straight   Functional Squat 15 reps   Functional Squat Limitations mini squats at counter    Rebounder balance 3 ways  standing on blue foam mat   Other Standing Knee Exercises one foot on foam mat, no UE support with one arm flexion - 2 x 15 x each way     Knee/Hip Exercises: Seated   Long Arc Quad Strengthening;Right;Left;2 sets;10 reps   Long Arc Quad Weight 2 lbs.   Sit to Sand 3 sets;5 reps;without UE support                  PT Short Term Goals - 06/04/17 1235  PT SHORT TERM GOAL #1   Title indepedent with initial HEP   Baseline issued at eval   Time 3   Period Weeks   Status Achieved     PT SHORT TERM GOAL #2   Title berg balance score >/= 45/56    Time 3   Period Weeks   Status On-going     PT SHORT TERM GOAL #3   Title Pt will report improved lumbar fatigue/LBP with static standing in kitchen x 25% to prepare a meal   Time 3   Period Weeks   Status Achieved           PT Long Term Goals - 06/11/17 1217      PT LONG TERM GOAL #1   Title Pt will demo improved bil LE strength >/= 4+/5 all major muscle groups to assist with decreased falls risk   Time 6   Period Weeks   Status On-going     PT LONG TERM GOAL #2   Title Berg balance score >/= 50/56   Baseline 36/56   Time 6   Period Weeks   Status On-going     PT LONG TERM GOAL #3   Title Pt will report no LBP/lumbar fatigue with WB activities >/= 45 min   Baseline 30 min    Time 6   Period Weeks   Status On-going     PT LONG TERM GOAL #4   Title pt will demo improved lumbar/core strength to good + to assist with decreased LBP with amb in grocery store   Time 6   Period Weeks   Status On-going               Plan - 06/11/17 1231    Clinical Impression Statement Pt unable to do scapular exercises due to arm bothering her when her blouse rubbed it.  Pt able to progress balance exercises today and did well with exericses using foam mat.  Pt contiues to need strength and demonstrates some fatigue during third set of sit to stand and needed to lean on table more.     PT Treatment/Interventions ADLs/Self Care Home Management;Functional mobility training;Stair training;Gait training;DME Instruction;Moist Heat;Therapeutic activities;Therapeutic exercise;Balance training;Neuromuscular re-education;Patient/family education;Manual techniques   PT Next Visit Plan continue to progress core/lumbar/LE strengthening and balance exercises     Consulted and Agree with Plan of Care Patient      Patient will benefit from skilled therapeutic intervention in order to improve the following deficits and impairments:  Abnormal gait, Decreased activity tolerance, Decreased balance, Decreased mobility, Decreased endurance, Decreased range of motion, Decreased strength, Difficulty walking, Impaired flexibility, Postural dysfunction, Improper body mechanics, Pain  Visit Diagnosis: Abnormal posture  Muscle weakness (generalized)  Other abnormalities of gait and mobility  Chronic midline low back pain without sciatica     Problem List Patient Active Problem List   Diagnosis Date Noted  . Epiretinal membrane, left eye 04/18/2017  . Acute blood loss anemia 09/27/2015  . Pelvic fracture (HCC) 09/21/2015  . Closed fracture of pelvis (HCC)   . Hypertensive retinopathy of both eyes 08/16/2015  . Stye external 02/23/2015  . Hypertension 01/28/2014  . Kyphoscoliosis  09/16/2013  . After cataract 02/19/2013  . Pseudophakia of both eyes 01/22/2013  . Posterior capsular opacification 10/29/2012  . Cystoid macular edema 04/30/2012  . Epiretinal membrane 04/30/2012  . MVP (mitral valve prolapse) 05/24/2011  . Hypercholesterolemia 05/24/2011    Vincente Poli, PT 06/11/2017, 12:36 PM  [facility] [address], [address] [place], [place], [address] Phone: [phone]   Fax:  [phone]  Name: Madison Odom MRN: [id number] Date of Birth: 1934/07/14

## 2017-06-13 ENCOUNTER — Ambulatory Visit: Payer: Medicare Other | Attending: Adult Health | Admitting: Physical Therapy

## 2017-06-13 DIAGNOSIS — R2689 Other abnormalities of gait and mobility: Secondary | ICD-10-CM | POA: Diagnosis present

## 2017-06-13 DIAGNOSIS — G8929 Other chronic pain: Secondary | ICD-10-CM | POA: Diagnosis present

## 2017-06-13 DIAGNOSIS — R293 Abnormal posture: Secondary | ICD-10-CM | POA: Insufficient documentation

## 2017-06-13 DIAGNOSIS — M6281 Muscle weakness (generalized): Secondary | ICD-10-CM | POA: Diagnosis present

## 2017-06-13 DIAGNOSIS — M545 Low back pain: Secondary | ICD-10-CM | POA: Insufficient documentation

## 2017-06-13 NOTE — Therapy (Signed)
[facility] [address], [address] [place], [place], [address] Phone: [phone]   Fax:  [phone]  Physical Therapy Treatment  Patient Details  Name: Madison Odom MRN: [id number] Date of Birth: 31-Oct-1934 Referring Provider: Shirline Freesory Nafziger, NP  Encounter Date: 06/13/2017      PT End of Session - 06/13/17 1155    Visit Number 7   Number of Visits 12   Date for PT Re-Evaluation 07/02/17   PT Start Time 1150   PT Stop Time 1230   PT Time Calculation (min) 40 min   Activity Tolerance Patient tolerated treatment well;No increased pain   Behavior During Therapy WFL for tasks assessed/performed      Past Medical History:  Diagnosis Date  . GERD (gastroesophageal reflux disease)   . Hyperlipidemia   . MVP (mitral valve prolapse)   . OP (osteoporosis)   . Pelvis fracture (HCC) 09/2015   CLOSED   . Scoliosis   . Spastic colon     Past Surgical History:  Procedure Laterality Date  . ABDOMINAL HYSTERECTOMY  1965  . APPENDECTOMY    . BILATERAL OOPHORECTOMY  1981  . EYE SURGERY    . ROTATOR CUFF REPAIR Right     There were no vitals filed for this visit.      Subjective Assessment - 06/13/17 1156    Subjective Patient had LOB at home in the kitchen after previous session due to feeling tired.  She caughter herself on the table and has bruising all over her right forearm today.     Pertinent History LBP for several years especially standing in the kitchen. Most recent fall March 2018 picking up paper off of the kitchen floor. Felt like she couldn't walk around grocery store after the fall and then fell again onto daughter amb up curb from store. Denies vertigo. Uses the rollator at home. Lives alone. Has driver.    Limitations Sitting;Lifting;Standing;Walking   Diagnostic tests severe scoliosis; diagnostic testing March 2018   Patient Stated Goals to be able to walk in the house without any help and use the cane outside    Currently in Pain? Yes   Pain Score 4    Pain Location Arm   Pain Orientation Right;Lower   Pain Descriptors / Indicators Sore   Pain Type Acute pain   Pain Onset In the past 7 days   Pain Frequency Intermittent   Aggravating Factors  nothing   Pain Relieving Factors rest   Multiple Pain Sites No                         [facility] Adult PT Treatment/Exercise - 06/13/17 0001      Neuro Re-ed    Neuro Re-ed Details  standing with UE support stepping to side over small foam roll; side stepping on UE along tall mat, no UE weight shift heel to toe, side to side     Knee/Hip Exercises: Aerobic   Nustep NuStep: lvl 2 , 6 min      Knee/Hip Exercises: Standing   Rebounder balance 3 ways - no UE - with and without head turns - 1 min each  standing on blue foam mat     Knee/Hip Exercises: Seated   Long Arc Quad Strengthening;Right;Left;2 sets;10 reps   Long Arc Quad Weight 2 lbs.   Sit to Sand 3 sets;5 reps;without UE support  PT Short Term Goals - 06/04/17 1235      PT SHORT TERM GOAL #1   Title indepedent with initial HEP   Baseline issued at eval   Time 3   Period Weeks   Status Achieved     PT SHORT TERM GOAL #2   Title berg balance score >/= 45/56    Time 3   Period Weeks   Status On-going     PT SHORT TERM GOAL #3   Title Pt will report improved lumbar fatigue/LBP with static standing in kitchen x 25% to prepare a meal   Time 3   Period Weeks   Status Achieved           PT Long Term Goals - 06/11/17 1217      PT LONG TERM GOAL #1   Title Pt will demo improved bil LE strength >/= 4+/5 all major muscle groups to assist with decreased falls risk   Time 6   Period Weeks   Status On-going     PT LONG TERM GOAL #2   Title Berg balance score >/= 50/56   Baseline 36/56   Time 6   Period Weeks   Status On-going     PT LONG TERM GOAL #3   Title Pt will report no LBP/lumbar fatigue with WB activities >/= 45 min    Baseline 30 min   Time 6   Period Weeks   Status On-going     PT LONG TERM GOAL #4   Title pt will demo improved lumbar/core strength to good + to assist with decreased LBP with amb in grocery store   Time 6   Period Weeks   Status On-going               Plan - 06/13/17 1324    Clinical Impression Statement Patient continues to need UE support with dynamic balance exercises.  She did well with static balance even progressing up to head turns without UE support.  pt continues to have fatigue with standing activities and that limits her balance as well.  Skilled PT needed for balance and LE strength and endurance   Clinical Impairments Affecting Rehab Potential osteoporosis, R RCR   PT Treatment/Interventions ADLs/Self Care Home Management;Functional mobility training;Stair training;Gait training;DME Instruction;Moist Heat;Therapeutic activities;Therapeutic exercise;Balance training;Neuromuscular re-education;Patient/family education;Manual techniques   PT Next Visit Plan continue to progress core/lumbar/LE strengthening and balance exercises     Consulted and Agree with Plan of Care Patient      Patient will benefit from skilled therapeutic intervention in order to improve the following deficits and impairments:  Abnormal gait, Decreased activity tolerance, Decreased balance, Decreased mobility, Decreased endurance, Decreased range of motion, Decreased strength, Difficulty walking, Impaired flexibility, Postural dysfunction, Improper body mechanics, Pain  Visit Diagnosis: Abnormal posture  Muscle weakness (generalized)  Other abnormalities of gait and mobility  Chronic midline low back pain without sciatica     Problem List Patient Active Problem List   Diagnosis Date Noted  . Epiretinal membrane, left eye 04/18/2017  . Acute blood loss anemia 09/27/2015  . Pelvic fracture (HCC) 09/21/2015  . Closed fracture of pelvis (HCC)   . Hypertensive retinopathy of both eyes  08/16/2015  . Stye external 02/23/2015  . Hypertension 01/28/2014  . Kyphoscoliosis 09/16/2013  . After cataract 02/19/2013  . Pseudophakia of both eyes 01/22/2013  . Posterior capsular opacification 10/29/2012  . Cystoid macular edema 04/30/2012  . Epiretinal membrane 04/30/2012  . MVP (mitral valve prolapse) 05/24/2011  .  Hypercholesterolemia 05/24/2011    Madison Odom 06/13/2017, 1:25 PM  [facility] [address] [place], [place], [address] Phone: [phone]   Fax:  [phone]  Name: Madison Odom MRN: [id number] Date of Birth: 1934/06/17

## 2017-06-16 ENCOUNTER — Other Ambulatory Visit: Payer: Self-pay | Admitting: Cardiology

## 2017-06-19 ENCOUNTER — Ambulatory Visit: Payer: Medicare Other | Admitting: Physical Therapy

## 2017-06-19 ENCOUNTER — Encounter: Payer: Self-pay | Admitting: Physical Therapy

## 2017-06-19 DIAGNOSIS — M6281 Muscle weakness (generalized): Secondary | ICD-10-CM

## 2017-06-19 DIAGNOSIS — G8929 Other chronic pain: Secondary | ICD-10-CM

## 2017-06-19 DIAGNOSIS — M545 Low back pain, unspecified: Secondary | ICD-10-CM

## 2017-06-19 DIAGNOSIS — R293 Abnormal posture: Secondary | ICD-10-CM

## 2017-06-19 DIAGNOSIS — R2689 Other abnormalities of gait and mobility: Secondary | ICD-10-CM

## 2017-06-19 NOTE — Therapy (Signed)
[facility] [address], [address] [place], [place], [address] Phone: [phone]   Fax:  [phone]  Physical Therapy Treatment  Patient Details  Name: Madison Odom MRN: [id number] Date of Birth: [birth year] Referring Provider: Shirline Freesory Nafziger, NP  Encounter Date: 06/19/2017      PT End of Session - 06/19/17 1221    Visit Number 8   Number of Visits 12   Date for PT Re-Evaluation 07/02/17   PT Start Time 1147   PT Stop Time 1228   PT Time Calculation (min) 41 min   Activity Tolerance Patient tolerated treatment well;No increased pain   Behavior During Therapy WFL for tasks assessed/performed      Past Medical History:  Diagnosis Date  . GERD (gastroesophageal reflux disease)   . Hyperlipidemia   . MVP (mitral valve prolapse)   . OP (osteoporosis)   . Pelvis fracture (HCC) 09/2015   CLOSED   . Scoliosis   . Spastic colon     Past Surgical History:  Procedure Laterality Date  . ABDOMINAL HYSTERECTOMY  1965  . APPENDECTOMY    . BILATERAL OOPHORECTOMY  1981  . EYE SURGERY    . ROTATOR CUFF REPAIR Right     There were no vitals filed for this visit.      Subjective Assessment - 06/19/17 1202    Subjective Patient denies pain.  Her arm is looking better without any swelling.  She has some buising still present on left hand.  Reports no LOB since last time.  States she has been using rollator a lot.   Pertinent History LBP for several years especially standing in the kitchen. Most recent fall March 2018 picking up paper off of the kitchen floor. Felt like she couldn't walk around grocery store after the fall and then fell again onto daughter amb up curb from store. Denies vertigo. Uses the rollator at home. Lives alone. Has driver.    Limitations Sitting;Lifting;Standing;Walking   Diagnostic tests severe scoliosis; diagnostic testing March 2018   Patient Stated Goals to be able to walk in the house without any help  and use the cane outside   Currently in Pain? No/denies                         [facility]/Exercise - 06/19/17 0001      Knee/Hip Exercises: Standing   Heel Raises 2 sets;3 seconds;10 reps   Knee Flexion Right;Left;10 reps;2 sets  1 lb   Hip Flexion Right;Left;Knee bent;2 sets;10 reps  1 lb   Hip Abduction Right;Left;Knee straight;2 sets;10 reps  1 lb   Hip Extension Right;Left;Knee straight;2 sets;10 reps  1 lb   Functional Squat 2 sets;10 reps   Functional Squat Limitations mini squats at counter    Rebounder balance 3 ways - no UE - with and without head turns - 1 min each  standing on blue foam mat   Other Standing Knee Exercises one foot on foam mat, no UE support with scap squeeze 2 x 15 x each way   Other Standing Knee Exercises modified tandem with head turns, side stepping along treadmill, foot tapping on treadmill - no UE support - CGA/close supervision     Knee/Hip Exercises: Seated   Sit to Sand without UE support;2 sets  8 reps                  PT Short Term Goals - 06/04/17  1235      PT SHORT TERM GOAL #1   Title indepedent with initial HEP   Baseline issued at eval   Time 3   Period Weeks   Status Achieved     PT SHORT TERM GOAL #2   Title berg balance score >/= 45/56    Time 3   Period Weeks   Status On-going     PT SHORT TERM GOAL #3   Title Pt will report improved lumbar fatigue/LBP with static standing in kitchen x 25% to prepare a meal   Time 3   Period Weeks   Status Achieved           PT Long Term Goals - 06/19/17 1204      PT LONG TERM GOAL #3   Title Pt will report no LBP/lumbar fatigue with WB activities >/= 45 min   Baseline reports no back pain with standing, and able to stand as long as she wants   Time 6   Period Weeks   Status Achieved     PT LONG TERM GOAL #4   Title pt will demo improved lumbar/core strength to good + to assist with decreased LBP with amb in grocery store   Time  6   Period Weeks   Status On-going     PT LONG TERM GOAL #5   Title go to the grocery store and shop while holding onto a basket by herself due to Berg balance >/= 55/56   Time 8   Period Weeks   Status Achieved               Plan - 06/19/17 1222    Clinical Impression Statement Patient able to increase resistance with standing exercies and tolerated more sit stand, completing 8x in a row.  Pt able to progress with less hand hold during balance activities such as standing in modified tandem, side stepping, one foot on foam roll.   PT Treatment/Interventions ADLs/Self Care Home Management;Functional mobility training;Stair training;Gait training;DME Instruction;Moist Heat;Therapeutic activities;Therapeutic exercise;Balance training;Neuromuscular re-education;Patient/family education;Manual techniques   PT Next Visit Plan focus on balance, core and LE strength   Consulted and Agree with Plan of Care Patient      Patient will benefit from skilled therapeutic intervention in order to improve the following deficits and impairments:  Abnormal gait, Decreased activity tolerance, Decreased balance, Decreased mobility, Decreased endurance, Decreased range of motion, Decreased strength, Difficulty walking, Impaired flexibility, Postural dysfunction, Improper body mechanics, Pain  Visit Diagnosis: Abnormal posture  Muscle weakness (generalized)  Other abnormalities of gait and mobility  Chronic midline low back pain without sciatica     Problem List Patient Active Problem List   Diagnosis Date Noted  . Epiretinal membrane, left eye 04/18/2017  . Acute blood loss anemia 09/27/2015  . Pelvic fracture (HCC) 09/21/2015  . Closed fracture of pelvis (HCC)   . Hypertensive retinopathy of both eyes 08/16/2015  . Stye external 02/23/2015  . Hypertension 01/28/2014  . Kyphoscoliosis 09/16/2013  . After cataract 02/19/2013  . Pseudophakia of both eyes 01/22/2013  . Posterior capsular  opacification 10/29/2012  . Cystoid macular edema 04/30/2012  . Epiretinal membrane 04/30/2012  . MVP (mitral valve prolapse) 05/24/2011  . Hypercholesterolemia 05/24/2011    Vincente Poli, PT 06/19/2017, 12:32 PM  [facility] [address], [address] [place], [place], [address] Phone: [phone]   Fax:  [phone]  Name: Madison Odom MRN: [id number] Date of Birth: 30-Oct-1934

## 2017-06-21 ENCOUNTER — Encounter: Payer: Self-pay | Admitting: Physical Therapy

## 2017-06-21 ENCOUNTER — Ambulatory Visit: Payer: Medicare Other | Admitting: Physical Therapy

## 2017-06-21 DIAGNOSIS — R293 Abnormal posture: Secondary | ICD-10-CM | POA: Diagnosis not present

## 2017-06-21 DIAGNOSIS — R2689 Other abnormalities of gait and mobility: Secondary | ICD-10-CM

## 2017-06-21 DIAGNOSIS — M545 Low back pain, unspecified: Secondary | ICD-10-CM

## 2017-06-21 DIAGNOSIS — G8929 Other chronic pain: Secondary | ICD-10-CM

## 2017-06-21 DIAGNOSIS — M6281 Muscle weakness (generalized): Secondary | ICD-10-CM

## 2017-06-21 NOTE — Therapy (Signed)
[facility] [address], [address] [place], [place], [address] Phone: [phone]   Fax:  [phone]  Physical Therapy Treatment  Patient Details  Name: Madison Odom MRN: [id number] Date of Birth: [birth year] Referring Provider: [name], NP  Encounter Date: 06/21/2017      PT End of Session - 06/21/17 1056    Visit Number 9   Date for PT Re-Evaluation 07/02/17   PT Start Time 1053   PT Stop Time 1136   PT Time Calculation (min) 43 min   Activity Tolerance Patient tolerated treatment well;No increased pain      Past Medical History:  Diagnosis Date  . GERD (gastroesophageal reflux disease)   . Hyperlipidemia   . MVP (mitral valve prolapse)   . OP (osteoporosis)   . Pelvis fracture (HCC) 09/2015   CLOSED   . Scoliosis   . Spastic colon     Past Surgical History:  Procedure Laterality Date  . ABDOMINAL HYSTERECTOMY  1965  . APPENDECTOMY    . BILATERAL OOPHORECTOMY  1981  . EYE SURGERY    . ROTATOR CUFF REPAIR Right     There were no vitals filed for this visit.                       [facility] Adult PT Treatment/Exercise - 06/21/17 0001      Berg Balance Test   Sit to Stand Able to stand without using hands and stabilize independently   Standing Unsupported Able to stand safely 2 minutes   Sitting with Back Unsupported but Feet Supported on Floor or Stool Able to sit safely and securely 2 minutes   Stand to Sit Sits safely with minimal use of hands   Transfers Able to transfer safely, minor use of hands   Standing Unsupported with Eyes Closed Able to stand 10 seconds safely   Standing Ubsupported with Feet Together Able to place feet together independently and stand 1 minute safely   From Standing, Reach Forward with Outstretched Arm Reaches forward but needs supervision   From Standing Position, Pick up Object from Floor Able to pick up shoe, needs supervision   From Standing Position,  Turn to Look Behind Over each Shoulder Looks behind one side only/other side shows less weight shift   Turn 360 Degrees Able to turn 360 degrees safely but slowly   Standing Unsupported, Alternately Place Feet on Step/Stool Able to stand independently and safely and complete 8 steps in 20 seconds   Standing Unsupported, One Foot in Front Able to plae foot ahead of the other independently and hold 30 seconds   Standing on One Leg Able to lift leg independently and hold equal to or more than 3 seconds  10 sec on left LE; 4 sec on Rt   Total Score 46     Therapeutic Activites    Therapeutic Activities ADL's   ADL's walking close to treadmill and PT close supervision without UE support - walking level surface with turns, walking stepping over pink weights     Neuro Re-ed    Neuro Re-ed Details  standing on blue oval tandem and weigth shift side to side     Knee/Hip Exercises: Aerobic   Nustep NuStep: lvl 2 , 6 min   PT present to discuss treatment                  PT Short Term Goals - 06/21/17 1057  PT SHORT TERM GOAL #1   Title indepedent with initial HEP   Time 3   Period Weeks   Status Achieved     PT SHORT TERM GOAL #2   Title berg balance score >/= 45/56    Baseline 46/56 (06/21/17)   Time 3   Period Weeks   Status Achieved     PT SHORT TERM GOAL #3   Title Pt will report improved lumbar fatigue/LBP with static standing in kitchen x 25% to prepare a meal   Time 3   Period Weeks   Status Achieved           PT Long Term Goals - 06/21/17 1058      PT LONG TERM GOAL #1   Title Pt will demo improved bil LE strength >/= 4+/5 all major muscle groups to assist with decreased falls risk   Time 6   Period Weeks   Status On-going     PT LONG TERM GOAL #4   Title pt will demo improved lumbar/core strength to good + to assist with decreased LBP with amb in grocery store   Baseline no back pain   Time 6   Period Weeks   Status On-going     PT LONG TERM  GOAL #5   Title go to the grocery store and shop while holding onto a basket by herself due to Berg balance >/= 55/56   Baseline hasn't been going lately   Time 8   Period Weeks   Status On-going               Plan - 06/21/17 1101    Clinical Impression Statement Patient demonstrates progress with meeting short term goal for improved berg balance score. Pt was able to do more advanced balance activities today such as walking and stepping over objects. Patient continues to have difficulty with balance and needs close supervision.  She needed some very light CGA when standing on foam mat.  Pt has decreased balance with fatigue and needs strength and endurance as well.    Clinical Impairments Affecting Rehab Potential osteoporosis, R RCR   PT Treatment/Interventions ADLs/Self Care Home Management;Functional mobility training;Stair training;Gait training;DME Instruction;Moist Heat;Therapeutic activities;Therapeutic exercise;Balance training;Neuromuscular re-education;Patient/family education;Manual techniques   PT Next Visit Plan gocodes next, assess goals and strength, focus on balance, core and LE strength   Consulted and Agree with Plan of Care Patient      Patient will benefit from skilled therapeutic intervention in order to improve the following deficits and impairments:  Abnormal gait, Decreased activity tolerance, Decreased balance, Decreased mobility, Decreased endurance, Decreased range of motion, Decreased strength, Difficulty walking, Impaired flexibility, Postural dysfunction, Improper body mechanics, Pain  Visit Diagnosis: Abnormal posture  Muscle weakness (generalized)  Other abnormalities of gait and mobility  Chronic midline low back pain without sciatica     Problem List Patient Active Problem List   Diagnosis Date Noted  . Epiretinal membrane, left eye 04/18/2017  . Acute blood loss anemia 09/27/2015  . Pelvic fracture (HCC) 09/21/2015  . Closed fracture of  pelvis (HCC)   . Hypertensive retinopathy of both eyes 08/16/2015  . Stye external 02/23/2015  . Hypertension 01/28/2014  . Kyphoscoliosis 09/16/2013  . After cataract 02/19/2013  . Pseudophakia of both eyes 01/22/2013  . Posterior capsular opacification 10/29/2012  . Cystoid macular edema 04/30/2012  . Epiretinal membrane 04/30/2012  . MVP (mitral valve prolapse) 05/24/2011  . Hypercholesterolemia 05/24/2011    Vincente Poli, PT 06/21/2017, 11:43 AM  [facility] [address], [address] [place], [place], [address] Phone: [phone]   Fax:  [phone]  Name: Cheryl Flashatsy A Lauricella MRN: [id number] Date of Birth: March 05, 1934

## 2017-06-25 ENCOUNTER — Ambulatory Visit: Payer: Medicare Other

## 2017-06-25 DIAGNOSIS — R293 Abnormal posture: Secondary | ICD-10-CM | POA: Diagnosis not present

## 2017-06-25 DIAGNOSIS — R2689 Other abnormalities of gait and mobility: Secondary | ICD-10-CM

## 2017-06-25 DIAGNOSIS — M545 Low back pain: Secondary | ICD-10-CM

## 2017-06-25 DIAGNOSIS — M6281 Muscle weakness (generalized): Secondary | ICD-10-CM

## 2017-06-25 DIAGNOSIS — G8929 Other chronic pain: Secondary | ICD-10-CM

## 2017-06-25 NOTE — Therapy (Signed)
[facility] [address], [address] [place], [place], [address] Phone: [phone]   Fax:  [phone]  Physical Therapy Treatment  Patient Details  Name: Madison Odom MRN: [id number] Date of Birth: 1934-01-22 Referring Provider: Shirline Freesory Nafziger, NP  Encounter Date: 06/25/2017      PT End of Session - 06/25/17 1133    Visit Number 10   Number of Visits 20   Date for PT Re-Evaluation 07/02/17   PT Start Time 1055   PT Stop Time 1134   PT Time Calculation (min) 39 min   Activity Tolerance Patient tolerated treatment well;No increased pain   Behavior During Therapy WFL for tasks assessed/performed      Past Medical History:  Diagnosis Date  . GERD (gastroesophageal reflux disease)   . Hyperlipidemia   . MVP (mitral valve prolapse)   . OP (osteoporosis)   . Pelvis fracture (HCC) 09/2015   CLOSED   . Scoliosis   . Spastic colon     Past Surgical History:  Procedure Laterality Date  . ABDOMINAL HYSTERECTOMY  1965  . APPENDECTOMY    . BILATERAL OOPHORECTOMY  1981  . EYE SURGERY    . ROTATOR CUFF REPAIR Right     There were no vitals filed for this visit.                       [facility] Adult PT Treatment/Exercise - 06/25/17 0001      Knee/Hip Exercises: Aerobic   Nustep NuStep: lvl 2 , 6 min   PT present to discuss treatment     Knee/Hip Exercises: Standing   Heel Raises 2 sets;3 seconds;10 reps   Knee Flexion Right;Left;10 reps;2 sets  1 lb   Hip Flexion Right;Left;Knee bent;2 sets;10 reps  1 lb   Hip Abduction Right;Left;Knee straight;2 sets;10 reps  1 lb   Hip Extension Right;Left;Knee straight;2 sets;10 reps  1 lb   Functional Squat 2 sets;10 reps   Functional Squat Limitations mini squats at counter    Rebounder weight shifting 3 ways x 1 minute each  standing on blue foam mat   Other Standing Knee Exercises balance on balance pad 2x20 seconds, modified tandem stance 2x20 seconds each  side     Knee/Hip Exercises: Seated   Sit to Sand 3 sets;5 reps;without UE support                  PT Short Term Goals - 06/25/17 1109      PT SHORT TERM GOAL #2   Title berg balance score >/= 45/56    Baseline 46/56 (06/21/17)   Status Achieved     PT SHORT TERM GOAL #3   Title Pt will report improved lumbar fatigue/LBP with static standing in kitchen x 25% to prepare a meal   Status Achieved           PT Long Term Goals - 06/25/17 1109      PT LONG TERM GOAL #1   Title Pt will demo improved bil LE strength >/= 4+/5 all major muscle groups to assist with decreased falls risk   Time 6   Period Weeks   Status On-going     PT LONG TERM GOAL #3   Title Pt will report no LBP/lumbar fatigue with WB activities >/= 45 min   Status Achieved     PT LONG TERM GOAL #4   Title pt will demo improved lumbar/core strength to good +  to assist with decreased LBP with amb in grocery store   Time 6   Period Weeks   Status On-going     PT LONG TERM GOAL #5   Title go to the grocery store and shop while holding onto a basket by herself due to Berg balance >/= 55/56   Baseline hasn't been going lately due to fall   Time 8   Period Weeks   Status On-going               Plan - 19-Jul-2017 1111    Clinical Impression Statement Berg balance score is improved by 10 points since the start of care.  Pt reports 50% overall improvement in lumbar pain and fatigue with standing.  Pt continues to have chronic balance due to postural dysfunction related to spine.  Pt will continue to benefit from skilled PT for balance, core strength, LE strength and gait.     Rehab Potential Excellent   Clinical Impairments Affecting Rehab Potential osteoporosis, R RCR   PT Frequency 2x / week   PT Duration 6 weeks   PT Next Visit Plan focus on balance, core and LE strength   Recommended Other Services initial certification has been signed   Consulted and Agree with Plan of Care Patient       Patient will benefit from skilled therapeutic intervention in order to improve the following deficits and impairments:  Abnormal gait, Decreased activity tolerance, Decreased balance, Decreased mobility, Decreased endurance, Decreased range of motion, Decreased strength, Difficulty walking, Impaired flexibility, Postural dysfunction, Improper body mechanics, Pain  Visit Diagnosis: Abnormal posture  Muscle weakness (generalized)  Other abnormalities of gait and mobility  Chronic midline low back pain without sciatica       G-Codes - 07-19-2017 1052    Functional Assessment Tool Used (Outpatient Only) Berg 46/56   Functional Limitation Mobility: Walking and moving around   Mobility: Walking and Moving Around Current Status (Z6109) At least 20 percent but less than 40 percent impaired, limited or restricted   Mobility: Walking and Moving Around Goal Status (U0454) At least 1 percent but less than 20 percent impaired, limited or restricted      Problem List Patient Active Problem List   Diagnosis Date Noted  . Epiretinal membrane, left eye 04/18/2017  . Acute blood loss anemia 09/27/2015  . Pelvic fracture (HCC) 09/21/2015  . Closed fracture of pelvis (HCC)   . Hypertensive retinopathy of both eyes 08/16/2015  . Stye external 02/23/2015  . Hypertension 01/28/2014  . Kyphoscoliosis 09/16/2013  . After cataract 02/19/2013  . Pseudophakia of both eyes 01/22/2013  . Posterior capsular opacification 10/29/2012  . Cystoid macular edema 04/30/2012  . Epiretinal membrane 04/30/2012  . MVP (mitral valve prolapse) 05/24/2011  . Hypercholesterolemia 05/24/2011    Lorrene Reid, PT 07-19-2017 11:38 AM  [facility] [address], [address] [place], [place], [address] Phone: [phone]   Fax:  [phone]  Name: Madison Odom MRN: [id number] Date of Birth: 06/23/1934

## 2017-06-27 ENCOUNTER — Ambulatory Visit: Payer: Medicare Other

## 2017-06-27 DIAGNOSIS — M545 Low back pain, unspecified: Secondary | ICD-10-CM

## 2017-06-27 DIAGNOSIS — R293 Abnormal posture: Secondary | ICD-10-CM | POA: Diagnosis not present

## 2017-06-27 DIAGNOSIS — M6281 Muscle weakness (generalized): Secondary | ICD-10-CM

## 2017-06-27 DIAGNOSIS — R2689 Other abnormalities of gait and mobility: Secondary | ICD-10-CM

## 2017-06-27 DIAGNOSIS — G8929 Other chronic pain: Secondary | ICD-10-CM

## 2017-06-27 NOTE — Therapy (Signed)
[facility] [address], [address] [place], [place], [address] Phone: [phone]   Fax:  [phone]  Physical Therapy Treatment  Patient Details  Name: Madison Odom MRN: [id number] Date of Birth: 26-Oct-1934 Referring Provider: [name], NP  Encounter Date: 06/27/2017      PT End of Session - 06/27/17 1109    Visit Number 11   Number of Visits 20   Date for PT Re-Evaluation 07/02/17   PT Start Time 1104   PT Stop Time 1142   PT Time Calculation (min) 38 min   Activity Tolerance Patient tolerated treatment well;No increased pain   Behavior During Therapy WFL for tasks assessed/performed      Past Medical History:  Diagnosis Date  . GERD (gastroesophageal reflux disease)   . Hyperlipidemia   . MVP (mitral valve prolapse)   . OP (osteoporosis)   . Pelvis fracture (HCC) 09/2015   CLOSED   . Scoliosis   . Spastic colon     Past Surgical History:  Procedure Laterality Date  . ABDOMINAL HYSTERECTOMY  1965  . APPENDECTOMY    . BILATERAL OOPHORECTOMY  1981  . EYE SURGERY    . ROTATOR CUFF REPAIR Right     There were no vitals filed for this visit.      Subjective Assessment - 06/27/17 1116    Subjective Pt. noting improved hand and arm status following near fall two weeks ago.     Patient Stated Goals to be able to walk in the house without any help and use the cane outside   Currently in Pain? No/denies   Pain Score 0-No pain   Multiple Pain Sites No                         [facility] Adult PT Treatment/Exercise - 06/27/17 1120      Neuro Re-ed    Neuro Re-ed Details  Side stepping at counter x 4 laps down/back      Knee/Hip Exercises: Aerobic   Nustep NuStep: lvl 2 , 10 min      Knee/Hip Exercises: Standing   Heel Raises 3 seconds;15 reps   Hip Abduction Right;Left;15 reps;Knee straight   Abduction Limitations 1#; at counter    Hip Extension Right;Left;Knee straight;15 reps   Extension Limitations 1#; at counter    Functional Squat 15 reps;1 set   Functional Squat Limitations mini squats at counter      Knee/Hip Exercises: Seated   Sit to Sand 3 sets;5 reps;without UE support                  PT Short Term Goals - 06/25/17 1109      PT SHORT TERM GOAL #2   Title berg balance score >/= 45/56    Baseline 46/56 (06/21/17)   Status Achieved     PT SHORT TERM GOAL #3   Title Pt will report improved lumbar fatigue/LBP with static standing in kitchen x 25% to prepare a meal   Status Achieved           PT Long Term Goals - 06/25/17 1109      PT LONG TERM GOAL #1   Title Pt will demo improved bil LE strength >/= 4+/5 all major muscle groups to assist with decreased falls risk   Time 6   Period Weeks   Status On-going     PT LONG TERM GOAL #3   Title Pt will  report no LBP/lumbar fatigue with WB activities >/= 45 min   Status Achieved     PT LONG TERM GOAL #4   Title pt will demo improved lumbar/core strength to good + to assist with decreased LBP with amb in grocery store   Time 6   Period Weeks   Status On-going     PT LONG TERM GOAL #5   Title go to the grocery store and shop while holding onto a basket by herself due to Berg balance >/= 55/56   Baseline hasn't been going lately due to fall   Time 8   Period Weeks   Status On-going               Plan - 06/27/17 1110    Clinical Impression Statement Madison Odom doing well today noting R hand and arm are healing with less bruising from near fall two weeks ago.  Pt. tolerated mild progression in LE strengthening activities well today without issue.  Will continue to progress strengthening and balance activities per pt. in coming visits.   PT Treatment/Interventions ADLs/Self Care Home Management;Functional mobility training;Stair training;Gait training;DME Instruction;Moist Heat;Therapeutic activities;Therapeutic exercise;Balance training;Neuromuscular re-education;Patient/family  education;Manual techniques   PT Next Visit Plan focus on balance, core and LE strength      Patient will benefit from skilled therapeutic intervention in order to improve the following deficits and impairments:  Abnormal gait, Decreased activity tolerance, Decreased balance, Decreased mobility, Decreased endurance, Decreased range of motion, Decreased strength, Difficulty walking, Impaired flexibility, Postural dysfunction, Improper body mechanics, Pain  Visit Diagnosis: Abnormal posture  Muscle weakness (generalized)  Other abnormalities of gait and mobility  Chronic midline low back pain without sciatica     Problem List Patient Active Problem List   Diagnosis Date Noted  . Epiretinal membrane, left eye 04/18/2017  . Acute blood loss anemia 09/27/2015  . Pelvic fracture (HCC) 09/21/2015  . Closed fracture of pelvis (HCC)   . Hypertensive retinopathy of both eyes 08/16/2015  . Stye external 02/23/2015  . Hypertension 01/28/2014  . Kyphoscoliosis 09/16/2013  . After cataract 02/19/2013  . Pseudophakia of both eyes 01/22/2013  . Posterior capsular opacification 10/29/2012  . Cystoid macular edema 04/30/2012  . Epiretinal membrane 04/30/2012  . MVP (mitral valve prolapse) 05/24/2011  . Hypercholesterolemia 05/24/2011    Kermit BaloMicah Tyneisha Hegeman, PTA 06/27/17 1:36 PM  [facility] [address], [address] [place], [place], [address] Phone: [phone]   Fax:  [phone]  Name: Madison Odom MRN: [id number] Date of Birth: [birth year]

## 2017-07-02 ENCOUNTER — Ambulatory Visit: Payer: Medicare Other | Admitting: Physical Therapy

## 2017-07-02 DIAGNOSIS — M6281 Muscle weakness (generalized): Secondary | ICD-10-CM

## 2017-07-02 DIAGNOSIS — R293 Abnormal posture: Secondary | ICD-10-CM

## 2017-07-02 DIAGNOSIS — M545 Low back pain, unspecified: Secondary | ICD-10-CM

## 2017-07-02 DIAGNOSIS — G8929 Other chronic pain: Secondary | ICD-10-CM

## 2017-07-02 DIAGNOSIS — R2689 Other abnormalities of gait and mobility: Secondary | ICD-10-CM

## 2017-07-02 NOTE — Therapy (Signed)
[facility] [address], [address] [place], [place], [address] Phone: [phone]   Fax:  [phone]  Physical Therapy Treatment  Patient Details  Name: Madison Odom MRN: [id number] Date of Birth: 1934-01-31 Referring Provider: [name], NP  Encounter Date: 07/02/2017      PT End of Session - 07/02/17 1151    Visit Number 12   Number of Visits 20   Date for PT Re-Evaluation 08/13/17   Authorization Type gcodes; kx at 15th visit   PT Start Time 1147   PT Stop Time 1228   PT Time Calculation (min) 41 min   Activity Tolerance Patient tolerated treatment well;No increased pain   Behavior During Therapy WFL for tasks assessed/performed      Past Medical History:  Diagnosis Date  . GERD (gastroesophageal reflux disease)   . Hyperlipidemia   . MVP (mitral valve prolapse)   . OP (osteoporosis)   . Pelvis fracture (HCC) 09/2015   CLOSED   . Scoliosis   . Spastic colon     Past Surgical History:  Procedure Laterality Date  . ABDOMINAL HYSTERECTOMY  1965  . APPENDECTOMY    . BILATERAL OOPHORECTOMY  1981  . EYE SURGERY    . ROTATOR CUFF REPAIR Right     There were no vitals filed for this visit.      Subjective Assessment - 07/02/17 1322    Subjective Pt states she has felt improvement but had set back when she fell and hit her arm.  States she has been more nervous and had trouble progressing at home.     Pertinent History LBP for several years especially standing in the kitchen. Most recent fall March 2018 picking up paper off of the kitchen floor. Felt like she couldn't walk around grocery store after the fall and then fell again onto daughter amb up curb from store. Denies vertigo. Uses the rollator at home. Lives alone. Has driver.    Limitations Sitting;Lifting;Standing;Walking   How long can you sit comfortably? 30 min   How long can you stand comfortably? 30 min   How long can you walk comfortably? 30  min   Diagnostic tests severe scoliosis; diagnostic testing March 2018   Patient Stated Goals to be able to walk in the house without any help and use the cane outside   Currently in Pain? No/denies            [facility] PT Assessment - 07/02/17 0001      Strength   Overall Strength Comments bil hip flex 4-, bil knee ext 4+, bil knee flex 4+, bil DF 4-, bilhip abdct 4+/5, bil hip ext 4/5 (07/02/17)                     [facility] Adult PT Treatment/Exercise - 07/02/17 0001      Knee/Hip Exercises: Standing   Heel Raises 20 reps   Knee Flexion Right;Left;10 reps;2 sets  1.5 lb   Hip Flexion Right;Left;Knee bent;2 sets;10 reps  1.5 lb   Hip Abduction Right;Left;15 reps;Knee straight  1.5 lb   Hip Extension Right;Left;Knee straight;15 reps  1.5 lb   Functional Squat 15 reps;1 set   Functional Squat Limitations mini squats at counter                   PT Short Term Goals - 06/25/17 1109      PT SHORT TERM GOAL #2   Title berg balance score >/=  45/56    Baseline 46/56 (06/21/17)   Status Achieved     PT SHORT TERM GOAL #3   Title Pt will report improved lumbar fatigue/LBP with static standing in kitchen x 25% to prepare a meal   Status Achieved           PT Long Term Goals - 07/02/17 1156      PT LONG TERM GOAL #1   Title Pt will demo improved bil LE strength >/= 4+/5 all major muscle groups to assist with decreased falls risk   Baseline bil hip flex 4-, bil knee ext 4+, bil knee flex 4+, bil DF 4-, bilhip abdct 4+/5, bil hip ext 4/5 (07/02/17)   Time 6   Period Weeks   Status On-going     PT LONG TERM GOAL #2   Title Berg balance score >/= 50/56   Baseline 46/56 (06/21/17)   Time 6   Period Weeks   Status On-going     PT LONG TERM GOAL #3   Title Pt will report no LBP/lumbar fatigue with WB activities >/= 45 min   Time 6   Period Weeks   Status Achieved     PT LONG TERM GOAL #4   Title pt will demo improved lumbar/core strength to good + to  assist with decreased LBP with amb in grocery store   Baseline haven't been to the store because daughters have been busy   Time 6   Period Weeks   Status On-going     PT LONG TERM GOAL #5   Title go to the grocery store and shop while holding onto a basket by herself due to [employer] >/= 55/56   Baseline hasn't gone lately   Time 8   Period Weeks   Status On-going               Plan - 07/02/17 1325    Clinical Impression Statement Patient has demonstrated progress with significant increase in Berg score since starting PT.  Pt had near fall which set her back a bit and she is more nervous. Pt demonstrates overall improved strength and balance.  She will benefit from continued PT at this time because she is expected to continue to make progress in her balance and will benefit from therapy from a safety standpoint.    PT Frequency 2x / week   PT Duration 6 weeks   PT Treatment/Interventions ADLs/Self Care Home Management;Functional mobility training;Stair training;Gait training;DME Instruction;Moist Heat;Therapeutic activities;Therapeutic exercise;Balance training;Neuromuscular re-education;Patient/family education;Manual techniques   PT Next Visit Plan focus on balance, core and LE strength   Consulted and Agree with Plan of Care Patient      Patient will benefit from skilled therapeutic intervention in order to improve the following deficits and impairments:  Abnormal gait, Decreased activity tolerance, Decreased balance, Decreased mobility, Decreased endurance, Decreased range of motion, Decreased strength, Difficulty walking, Impaired flexibility, Postural dysfunction, Improper body mechanics, Pain  Visit Diagnosis: Abnormal posture - Plan: PT plan of care cert/re-cert  Muscle weakness (generalized) - Plan: PT plan of care cert/re-cert  Other abnormalities of gait and mobility - Plan: PT plan of care cert/re-cert  Chronic midline low back pain without sciatica - Plan: PT  plan of care cert/re-cert     Problem List Patient Active Problem List   Diagnosis Date Noted  . Epiretinal membrane, left eye 04/18/2017  . Acute blood loss anemia 09/27/2015  . Pelvic fracture (HCC) 09/21/2015  . Closed fracture of pelvis (HCC)   .  Hypertensive retinopathy of both eyes 08/16/2015  . Stye external 02/23/2015  . Hypertension 01/28/2014  . Kyphoscoliosis 09/16/2013  . After cataract 02/19/2013  . Pseudophakia of both eyes 01/22/2013  . Posterior capsular opacification 10/29/2012  . Cystoid macular edema 04/30/2012  . Epiretinal membrane 04/30/2012  . MVP (mitral valve prolapse) 05/24/2011  . Hypercholesterolemia 05/24/2011    Vincente Poli, PT 07/02/2017, 1:31 PM  [facility] [address], [address] [place], [place], [address] Phone: [phone]   Fax:  [phone]  Name: TAMIKI KUBA MRN: [id number] Date of Birth: [birth year]

## 2017-07-09 ENCOUNTER — Encounter: Payer: Self-pay | Admitting: Physical Therapy

## 2017-07-09 ENCOUNTER — Ambulatory Visit: Payer: Medicare Other | Admitting: Physical Therapy

## 2017-07-09 DIAGNOSIS — M6281 Muscle weakness (generalized): Secondary | ICD-10-CM

## 2017-07-09 DIAGNOSIS — R2689 Other abnormalities of gait and mobility: Secondary | ICD-10-CM

## 2017-07-09 DIAGNOSIS — M545 Low back pain: Secondary | ICD-10-CM

## 2017-07-09 DIAGNOSIS — R293 Abnormal posture: Secondary | ICD-10-CM | POA: Diagnosis not present

## 2017-07-09 DIAGNOSIS — G8929 Other chronic pain: Secondary | ICD-10-CM

## 2017-07-09 NOTE — Therapy (Signed)
[facility] [address], [address] [place], [place], [address] Phone: [phone]   Fax:  [phone]  Physical Therapy Treatment  Patient Details  Name: Madison Odom MRN: [id number] Date of Birth: December 29, 1933 Referring Provider: [name], NP  Encounter Date: 07/09/2017      PT End of Session - 07/09/17 1141    Visit Number 13   Number of Visits 20   Date for PT Re-Evaluation 08/13/17   Authorization Type gcodes; kx at 15th visit   PT Start Time 1140   PT Stop Time 1219   PT Time Calculation (min) 39 min   Activity Tolerance Patient tolerated treatment well;No increased pain   Behavior During Therapy WFL for tasks assessed/performed      Past Medical History:  Diagnosis Date  . GERD (gastroesophageal reflux disease)   . Hyperlipidemia   . MVP (mitral valve prolapse)   . OP (osteoporosis)   . Pelvis fracture (HCC) 09/2015   CLOSED   . Scoliosis   . Spastic colon     Past Surgical History:  Procedure Laterality Date  . ABDOMINAL HYSTERECTOMY  1965  . APPENDECTOMY    . BILATERAL OOPHORECTOMY  1981  . EYE SURGERY    . ROTATOR CUFF REPAIR Right     There were no vitals filed for this visit.      Subjective Assessment - 07/09/17 1141    Subjective I put an ace bandage on my leg because it was popping a lot today.  I walking around the countertops without holding on.  I still feel like I need to hold something everywhere I walk.   Pertinent History LBP for several years especially standing in the kitchen. Most recent fall March 2018 picking up paper off of the kitchen floor. Felt like she couldn't walk around grocery store after the fall and then fell again onto daughter amb up curb from store. Denies vertigo. Uses the rollator at home. Lives alone. Has driver.    Patient Stated Goals to be able to walk in the house without any help and use the cane outside   Currently in Pain? No/denies                          [facility] Adult PT Treatment/Exercise - 07/09/17 0001      Therapeutic Activites    Therapeutic Activities ADL's   ADL's walking, stepping over objects fwd/side     Neuro Re-ed    Neuro Re-ed Details  standing exercises performed on foam mat     Knee/Hip Exercises: Aerobic   Nustep NuStep: lvl 2 , 8 min      Knee/Hip Exercises: Standing   Heel Raises 20 reps  on black foam mat   Knee Flexion Right;Left;10 reps  1.5 lb   Hip Flexion Right;Left;Knee bent;10 reps  1.5 lb standing on black foam mat   Hip Abduction Right;Left;Knee straight;10 reps  1.5 lb standing on foam mat   Hip Extension Right;Left;Knee straight;10 reps  1.5 lb standing on black foam   Forward Step Up 15 reps;Hand Hold: 1;Step Height: 2"  step onto foam mat   Functional Squat 15 reps;1 set   Functional Squat Limitations mini squats at counter    Rebounder weight shifting 3 ways x 1 minute each  standing on blue foam mat   Other Standing Knee Exercises standing on foam mat looking behind, narrow base looking side to side  Knee/Hip Exercises: Seated   Sit to Sand 10 reps  standing on foam                  PT Short Term Goals - 06/25/17 1109      PT SHORT TERM GOAL #2   Title berg balance score >/= 45/56    Baseline 46/56 (06/21/17)   Status Achieved     PT SHORT TERM GOAL #3   Title Pt will report improved lumbar fatigue/LBP with static standing in kitchen x 25% to prepare a meal   Status Achieved           PT Long Term Goals - 07/09/17 1145      PT LONG TERM GOAL #1   Title Pt will demo improved bil LE strength >/= 4+/5 all major muscle groups to assist with decreased falls risk   Time 6   Period Weeks   Status On-going     PT LONG TERM GOAL #2   Title Berg balance score >/= 50/56   Time 6   Period Weeks   Status On-going     PT LONG TERM GOAL #4   Title pt will demo improved lumbar/core strength to good + to assist with decreased LBP  with amb in grocery store   Time 6   Period Weeks   Status On-going     PT LONG TERM GOAL #5   Title go to the grocery store and shop while holding onto a basket by herself due to [employer] >/= 55/56   Time 8   Period Weeks   Status On-going               Plan - 07/09/17 1141    Clinical Impression Statement Patient continues to make progress and demonstrates more exercises on unsteady surface.  Continues to be hesitant to let go and needs cues to challenge her balance.  Needs occasional CGA to min assist due to LOB.  Pt benefits from skilled therapy to reduce risk of falls.   Rehab Potential Excellent   Clinical Impairments Affecting Rehab Potential osteoporosis, R RCR   PT Treatment/Interventions ADLs/Self Care Home Management;Functional mobility training;Stair training;Gait training;DME Instruction;Moist Heat;Therapeutic activities;Therapeutic exercise;Balance training;Neuromuscular re-education;Patient/family education;Manual techniques   PT Next Visit Plan focus on balance, core and LE strength   Consulted and Agree with Plan of Care Patient      Patient will benefit from skilled therapeutic intervention in order to improve the following deficits and impairments:  Abnormal gait, Decreased activity tolerance, Decreased balance, Decreased mobility, Decreased endurance, Decreased range of motion, Decreased strength, Difficulty walking, Impaired flexibility, Postural dysfunction, Improper body mechanics, Pain  Visit Diagnosis: Abnormal posture  Muscle weakness (generalized)  Other abnormalities of gait and mobility  Chronic midline low back pain without sciatica     Problem List Patient Active Problem List   Diagnosis Date Noted  . Epiretinal membrane, left eye 04/18/2017  . Acute blood loss anemia 09/27/2015  . Pelvic fracture (HCC) 09/21/2015  . Closed fracture of pelvis (HCC)   . Hypertensive retinopathy of both eyes 08/16/2015  . Stye external 02/23/2015   . Hypertension 01/28/2014  . Kyphoscoliosis 09/16/2013  . After cataract 02/19/2013  . Pseudophakia of both eyes 01/22/2013  . Posterior capsular opacification 10/29/2012  . Cystoid macular edema 04/30/2012  . Epiretinal membrane 04/30/2012  . MVP (mitral valve prolapse) 05/24/2011  . Hypercholesterolemia 05/24/2011    Alphonsa Overall 07/09/2017, 1:38 PM  [facility] [address], [address] [place], [place], [address] Phone: [phone]   Fax:  [phone]  Name: TERRIS BODIN MRN: [id number] Date of Birth: 23-Feb-1934

## 2017-07-11 ENCOUNTER — Ambulatory Visit: Payer: Medicare Other

## 2017-07-11 DIAGNOSIS — M545 Low back pain: Secondary | ICD-10-CM

## 2017-07-11 DIAGNOSIS — G8929 Other chronic pain: Secondary | ICD-10-CM

## 2017-07-11 DIAGNOSIS — R293 Abnormal posture: Secondary | ICD-10-CM | POA: Diagnosis not present

## 2017-07-11 DIAGNOSIS — M6281 Muscle weakness (generalized): Secondary | ICD-10-CM

## 2017-07-11 DIAGNOSIS — R2689 Other abnormalities of gait and mobility: Secondary | ICD-10-CM

## 2017-07-11 NOTE — Therapy (Signed)
[facility] [address], [address] [place], [place], [address] Phone: [phone]   Fax:  [phone]  Physical Therapy Treatment  Patient Details  Name: Madison Odom MRN: [id number] Date of Birth: 09/18/1934 Referring Provider: Shirline Freesory Nafziger, NP  Encounter Date: 07/11/2017      PT End of Session - 07/11/17 1156    Visit Number 14   Number of Visits 20   Date for PT Re-Evaluation 08/13/17   Authorization Type gcodes; kx at 15th visit   PT Start Time 1146   PT Stop Time 1224   PT Time Calculation (min) 38 min   Activity Tolerance Patient tolerated treatment well;No increased pain   Behavior During Therapy WFL for tasks assessed/performed      Past Medical History:  Diagnosis Date  . GERD (gastroesophageal reflux disease)   . Hyperlipidemia   . MVP (mitral valve prolapse)   . OP (osteoporosis)   . Pelvis fracture (HCC) 09/2015   CLOSED   . Scoliosis   . Spastic colon     Past Surgical History:  Procedure Laterality Date  . ABDOMINAL HYSTERECTOMY  1965  . APPENDECTOMY    . BILATERAL OOPHORECTOMY  1981  . EYE SURGERY    . ROTATOR CUFF REPAIR Right     There were no vitals filed for this visit.      Subjective Assessment - 07/11/17 1155    Subjective Doing well today noting she still is afraid of falling needing to hold on wherever she goes.     Patient Stated Goals to be able to walk in the house without any help and use the cane outside   Currently in Pain? No/denies   Pain Score 0-No pain   Multiple Pain Sites No                         [facility] Adult PT Treatment/Exercise - 07/11/17 1213      Knee/Hip Exercises: Aerobic   Nustep NuStep: lvl 2 , 10 min      Knee/Hip Exercises: Standing   Knee Flexion Right;Left;15 reps   Knee Flexion Limitations 1.5#; at counter    Hip Flexion Right;Left;15 reps;Knee bent   Hip Flexion Limitations 1.5#; at counter   Hip Abduction Right;Left;15  reps;Knee straight   Abduction Limitations 1.5#; at counter   Hip Extension Right;Left;15 reps;Knee straight   Extension Limitations 1.5#; at counter   Functional Squat 15 reps;1 set   Functional Squat Limitations mini squats at counter      Shoulder Exercises: Seated   Row 20 reps;Theraband   Theraband Level (Shoulder Row) Level 2 (Red)   Row Limitations seated with red TB in door                PT Education - 07/11/17 1229    Education provided Yes   Education Details hip abduction, extension, flexion (with 2# weights at home pt. has)    Person(s) Educated Patient   Methods Explanation;Demonstration;Verbal cues;Handout   Comprehension Verbalized understanding;Returned demonstration;Verbal cues required;Need further instruction          PT Short Term Goals - 06/25/17 1109      PT SHORT TERM GOAL #2   Title berg balance score >/= 45/56    Baseline 46/56 (06/21/17)   Status Achieved     PT SHORT TERM GOAL #3   Title Pt will report improved lumbar fatigue/LBP with static standing in kitchen x 25% to prepare  a meal   Status Achieved           PT Long Term Goals - 07/09/17 1145      PT LONG TERM GOAL #1   Title Pt will demo improved bil LE strength >/= 4+/5 all major muscle groups to assist with decreased falls risk   Time 6   Period Weeks   Status On-going     PT LONG TERM GOAL #2   Title Berg balance score >/= 50/56   Time 6   Period Weeks   Status On-going     PT LONG TERM GOAL #4   Title pt will demo improved lumbar/core strength to good + to assist with decreased LBP with amb in grocery store   Time 6   Period Weeks   Status On-going     PT LONG TERM GOAL #5   Title go to the grocery store and shop while holding onto a basket by herself due to [employer] >/= 55/56   Time 8   Period Weeks   Status On-going               Plan - 07/11/17 1212    Clinical Impression Statement Pt. doing well today noting she feels her "arm is healing  up" from near fall a few weeks ago.  Tolerated mild progression in reps with all standing activities without issue today.  Seems to be improving self-awareness of upright posture how requiring less cueing.  HEP updated to include standing strength activities today issued to pt. via handout.      PT Treatment/Interventions ADLs/Self Care Home Management;Functional mobility training;Stair training;Gait training;DME Instruction;Moist Heat;Therapeutic activities;Therapeutic exercise;Balance training;Neuromuscular re-education;Patient/family education;Manual techniques   PT Next Visit Plan focus on balance, core and LE strength      Patient will benefit from skilled therapeutic intervention in order to improve the following deficits and impairments:  Abnormal gait, Decreased activity tolerance, Decreased balance, Decreased mobility, Decreased endurance, Decreased range of motion, Decreased strength, Difficulty walking, Impaired flexibility, Postural dysfunction, Improper body mechanics, Pain  Visit Diagnosis: Abnormal posture  Muscle weakness (generalized)  Other abnormalities of gait and mobility  Chronic midline low back pain without sciatica     Problem List Patient Active Problem List   Diagnosis Date Noted  . Epiretinal membrane, left eye 04/18/2017  . Acute blood loss anemia 09/27/2015  . Pelvic fracture (HCC) 09/21/2015  . Closed fracture of pelvis (HCC)   . Hypertensive retinopathy of both eyes 08/16/2015  . Stye external 02/23/2015  . Hypertension 01/28/2014  . Kyphoscoliosis 09/16/2013  . After cataract 02/19/2013  . Pseudophakia of both eyes 01/22/2013  . Posterior capsular opacification 10/29/2012  . Cystoid macular edema 04/30/2012  . Epiretinal membrane 04/30/2012  . MVP (mitral valve prolapse) 05/24/2011  . Hypercholesterolemia 05/24/2011    Kermit Balo, PTA 07/11/17 1:44 PM  [facility] [address] [place], [place], [address] Phone: [phone]   Fax:  [phone]  Name: Madison Odom MRN: [id number] Date of Birth: July 30, 1934

## 2017-07-18 ENCOUNTER — Ambulatory Visit: Payer: Medicare Other | Attending: Adult Health | Admitting: Physical Therapy

## 2017-07-18 ENCOUNTER — Encounter: Payer: Self-pay | Admitting: Physical Therapy

## 2017-07-18 DIAGNOSIS — M545 Low back pain: Secondary | ICD-10-CM | POA: Diagnosis present

## 2017-07-18 DIAGNOSIS — R2689 Other abnormalities of gait and mobility: Secondary | ICD-10-CM

## 2017-07-18 DIAGNOSIS — G8929 Other chronic pain: Secondary | ICD-10-CM

## 2017-07-18 DIAGNOSIS — R293 Abnormal posture: Secondary | ICD-10-CM | POA: Diagnosis not present

## 2017-07-18 DIAGNOSIS — M6281 Muscle weakness (generalized): Secondary | ICD-10-CM | POA: Diagnosis present

## 2017-07-18 NOTE — Therapy (Signed)
[facility] [address] [place], [place], [address] Phone: [phone]   Fax:  [phone]  Physical Therapy Treatment  Patient Details  Name: Madison Odom MRN: [id number] Date of Birth: Mar 12, 1934 Referring Provider: [name], NP  Encounter Date: 07/18/2017      PT End of Session - 07/18/17 1146    Visit Number 15   Number of Visits 20   Date for PT Re-Evaluation 08/13/17   Authorization Type gcodes; kx at 15th visit   PT Start Time 1142   PT Stop Time 1220   PT Time Calculation (min) 38 min   Activity Tolerance Patient tolerated treatment well;No increased pain   Behavior During Therapy WFL for tasks assessed/performed      Past Medical History:  Diagnosis Date  . GERD (gastroesophageal reflux disease)   . Hyperlipidemia   . MVP (mitral valve prolapse)   . OP (osteoporosis)   . Pelvis fracture (HCC) 09/2015   CLOSED   . Scoliosis   . Spastic colon     Past Surgical History:  Procedure Laterality Date  . ABDOMINAL HYSTERECTOMY  1965  . APPENDECTOMY    . BILATERAL OOPHORECTOMY  1981  . EYE SURGERY    . ROTATOR CUFF REPAIR Right     There were no vitals filed for this visit.      Subjective Assessment - 07/18/17 1144    Subjective I am feeling pretty good, just wished I had more energy.    Currently in Pain? No/denies   Multiple Pain Sites No                         [facility] Adult PT Treatment/Exercise - 07/18/17 0001      High Level Balance   High Level Balance Activities Side stepping     Neuro Re-ed    Neuro Re-ed Details  standing exercises performed on foam mat     Knee/Hip Exercises: Aerobic   Nustep L2 x 12 min  PTA present for goal review/status review     Knee/Hip Exercises: Standing   Heel Raises 20 reps  on black foam mat   Knee Flexion Right;Left;15 reps   Knee Flexion Limitations 1.5#; at counter    Hip Flexion Right;Left;15 reps;Knee bent   Hip  Flexion Limitations 1.5#; at counter   Hip Abduction Right;Left;15 reps;Knee straight   Abduction Limitations 1.5#; at counter   Hip Extension Right;Left;15 reps;Knee straight   Extension Limitations 1.5#; at counter   Rebounder weight shifting 3 ways x 1 minute each  standing on black foam mat   Other Standing Knee Exercises standing on foam mat looking behind, narrow base looking side to side   Other Standing Knee Exercises walking 3 laps of ortho gym: focus on tall walking     Knee/Hip Exercises: Seated   Long Arc Quad Strengthening;Both;1 set;20 reps;Weights   Long Arc Quad Weight 2 lbs.   Sit to Sand 10 reps  standing on foam                  PT Short Term Goals - 06/25/17 1109      PT SHORT TERM GOAL #2   Title berg balance score >/= 45/56    Baseline 46/56 (06/21/17)   Status Achieved     PT SHORT TERM GOAL #3   Title Pt will report improved lumbar fatigue/LBP with static standing in kitchen x 25% to prepare a meal  Status Achieved           PT Long Term Goals - 07/09/17 1145      PT LONG TERM GOAL #1   Title Pt will demo improved bil LE strength >/= 4+/5 all major muscle groups to assist with decreased falls risk   Time 6   Period Weeks   Status On-going     PT LONG TERM GOAL #2   Title Berg balance score >/= 50/56   Time 6   Period Weeks   Status On-going     PT LONG TERM GOAL #4   Title pt will demo improved lumbar/core strength to good + to assist with decreased LBP with amb in grocery store   Time 6   Period Weeks   Status On-going     PT LONG TERM GOAL #5   Title go to the grocery store and shop while holding onto a basket by herself due to [employer] balance >/= 55/56   Time 8   Period Weeks   Status On-going               Plan - 07/18/17 1146    Clinical Impression Statement Pt presents today feeling good, no pain complaints, just low energy. Low energy report did not limit pt during her session today. Pt cshows the ability to  correct her posture as best she can independently and is doing well with light resistance for standing activities.  Still has low confidence to do much away from her home due to a fear of falling and breaking something.    Rehab Potential Excellent   Clinical Impairments Affecting Rehab Potential osteoporosis, R RCR   PT Frequency 2x / week   PT Duration 6 weeks   PT Treatment/Interventions ADLs/Self Care Home Management;Functional mobility training;Stair training;Gait training;DME Instruction;Moist Heat;Therapeutic activities;Therapeutic exercise;Balance training;Neuromuscular re-education;Patient/family education;Manual techniques   PT Next Visit Plan focus on balance, core and LE strength   Consulted and Agree with Plan of Care Patient      Patient will benefit from skilled therapeutic intervention in order to improve the following deficits and impairments:  Abnormal gait, Decreased activity tolerance, Decreased balance, Decreased mobility, Decreased endurance, Decreased range of motion, Decreased strength, Difficulty walking, Impaired flexibility, Postural dysfunction, Improper body mechanics, Pain  Visit Diagnosis: Abnormal posture  Muscle weakness (generalized)  Other abnormalities of gait and mobility  Chronic midline low back pain without sciatica     Problem List Patient Active Problem List   Diagnosis Date Noted  . Epiretinal membrane, left eye 04/18/2017  . Acute blood loss anemia 09/27/2015  . Pelvic fracture (HCC) 09/21/2015  . Closed fracture of pelvis (HCC)   . Hypertensive retinopathy of both eyes 08/16/2015  . Stye external 02/23/2015  . Hypertension 01/28/2014  . Kyphoscoliosis 09/16/2013  . After cataract 02/19/2013  . Pseudophakia of both eyes 01/22/2013  . Posterior capsular opacification 10/29/2012  . Cystoid macular edema 04/30/2012  . Epiretinal membrane 04/30/2012  . MVP (mitral valve prolapse) 05/24/2011  . Hypercholesterolemia 05/24/2011     Shamaria Kavan, PTA 07/18/2017, 12:26 PM  [facility] [address], [address] [place], [place], [address] Phone: [phone]   Fax:  [phone]  Name: Madison Odom MRN: [id number] Date of Birth: 11-16-1933

## 2017-07-23 ENCOUNTER — Ambulatory Visit: Payer: Medicare Other | Admitting: Physical Therapy

## 2017-07-23 DIAGNOSIS — R293 Abnormal posture: Secondary | ICD-10-CM | POA: Diagnosis not present

## 2017-07-23 DIAGNOSIS — M6281 Muscle weakness (generalized): Secondary | ICD-10-CM

## 2017-07-23 DIAGNOSIS — G8929 Other chronic pain: Secondary | ICD-10-CM

## 2017-07-23 DIAGNOSIS — R2689 Other abnormalities of gait and mobility: Secondary | ICD-10-CM

## 2017-07-23 DIAGNOSIS — M545 Low back pain: Secondary | ICD-10-CM

## 2017-07-23 NOTE — Therapy (Signed)
[facility] [address], [address] [place], [place], [address] Phone: [phone]   Fax:  [phone]  Physical Therapy Treatment  Patient Details  Name: Cheryl Flashatsy A Berkland MRN: [id number] Date of Birth: [birth year] Referring Provider: Shirline Freesory Nafziger, NP  Encounter Date: 07/23/2017      PT End of Session - 07/23/17 1224    Visit Number 16   Number of Visits 20   Date for PT Re-Evaluation 08/13/17   Authorization Type gcodes; kx at 15th visit   PT Start Time 1148   PT Stop Time 1228   PT Time Calculation (min) 40 min   Activity Tolerance Patient tolerated treatment well;No increased pain   Behavior During Therapy WFL for tasks assessed/performed      Past Medical History:  Diagnosis Date  . GERD (gastroesophageal reflux disease)   . Hyperlipidemia   . MVP (mitral valve prolapse)   . OP (osteoporosis)   . Pelvis fracture (HCC) 09/2015   CLOSED   . Scoliosis   . Spastic colon     Past Surgical History:  Procedure Laterality Date  . ABDOMINAL HYSTERECTOMY  1965  . APPENDECTOMY    . BILATERAL OOPHORECTOMY  1981  . EYE SURGERY    . ROTATOR CUFF REPAIR Right     There were no vitals filed for this visit.      Subjective Assessment - 07/23/17 1226    Subjective I am feeling good today.  I got a pillow to sit on that helps my back and I went to the store and walked about 30 minutes with my daughter this weekned.   Pertinent History LBP for several years especially standing in the kitchen. Most recent fall March 2018 picking up paper off of the kitchen floor. Felt like she couldn't walk around grocery store after the fall and then fell again onto daughter amb up curb from store. Denies vertigo. Uses the rollator at home. Lives alone. Has driver.    Limitations Sitting;Lifting;Standing;Walking   Patient Stated Goals to be able to walk in the house without any help and use the cane outside   Currently in Pain? No/denies                          [facility] - 07/23/17 0001      High Level Balance   High Level Balance Activities Side stepping     Knee/Hip Exercises: Aerobic   Nustep L2 x 12 min  PT present for goal review/status review     Knee/Hip Exercises: Standing   Heel Raises 20 reps  on black foam mat   Knee Flexion Right;Left;15 reps   Knee Flexion Limitations 1.5#; at counter    Hip Flexion Right;Left;15 reps;Knee bent   Hip Flexion Limitations 1.5#; at counter   Hip Abduction Right;Left;15 reps;Knee straight   Abduction Limitations 1.5#; at counter   Hip Extension Right;Left;15 reps;Knee straight   Extension Limitations 1.5#; at counter   Functional Squat 2 sets;10 reps   Functional Squat Limitations mini squats at counter    Rebounder weight shifting 3 ways x 1 minute each   Other Standing Knee Exercises standing on foam mat looking behind, narrow base looking side to side   Other Standing Knee Exercises walking 3 laps of ortho gym: focus on tall walking     Knee/Hip Exercises: Seated   Long Arc Quad Strengthening;Both;1 set;20 reps;Weights   Long Arc Quad Weight 2 lbs.  PT Short Term Goals - 06/25/17 1109      PT SHORT TERM GOAL #2   Title berg balance score >/= 45/56    Baseline 46/56 (06/21/17)   Status Achieved     PT SHORT TERM GOAL #3   Title Pt will report improved lumbar fatigue/LBP with static standing in kitchen x 25% to prepare a meal   Status Achieved           PT Long Term Goals - 07/23/17 1227      PT LONG TERM GOAL #1   Title Pt will demo improved bil LE strength >/= 4+/5 all major muscle groups to assist with decreased falls risk   Time 6   Period Weeks   Status On-going     PT LONG TERM GOAL #2   Title Berg balance score >/= 50/56   Time 6   Period Weeks   Status On-going     PT LONG TERM GOAL #4   Title pt will demo improved lumbar/core strength to good + to assist with decreased  LBP with amb in grocery store   Baseline [employer] this weekend for 30 minutes no back pain   Time 6   Period Weeks   Status Achieved     PT LONG TERM GOAL #5   Title go to the grocery store and shop while holding onto a basket by herself due to [employer] >/= 55/56   Time 8   Period Weeks   Status On-going               Plan - 07/23/17 1224    Clinical Impression Statement Patient went out and walked around the store the other day and is going today after treatment.  She demonstrates making progress towards goal of walking in community.  Pt will benefit from skilled PT to continue working on balance and stability for reduced risk of falls   Rehab Potential Excellent   PT Treatment/Interventions ADLs/Self Care Home Management;Functional mobility training;Stair training;Gait training;DME Instruction;Moist Heat;Therapeutic activities;Therapeutic exercise;Balance training;Neuromuscular re-education;Patient/family education;Manual techniques   PT Next Visit Plan focus on balance, core and LE strength   Consulted and Agree with Plan of Care Patient      Patient will benefit from skilled therapeutic intervention in order to improve the following deficits and impairments:  Abnormal gait, Decreased activity tolerance, Decreased balance, Decreased mobility, Decreased endurance, Decreased range of motion, Decreased strength, Difficulty walking, Impaired flexibility, Postural dysfunction, Improper body mechanics, Pain  Visit Diagnosis: Abnormal posture  Muscle weakness (generalized)  Other abnormalities of gait and mobility  Chronic midline low back pain without sciatica     Problem List Patient Active Problem List   Diagnosis Date Noted  . Epiretinal membrane, left eye 04/18/2017  . Acute blood loss anemia 09/27/2015  . Pelvic fracture (HCC) 09/21/2015  . Closed fracture of pelvis (HCC)   . Hypertensive retinopathy of both eyes 08/16/2015  . Stye external 02/23/2015  .  Hypertension 01/28/2014  . Kyphoscoliosis 09/16/2013  . After cataract 02/19/2013  . Pseudophakia of both eyes 01/22/2013  . Posterior capsular opacification 10/29/2012  . Cystoid macular edema 04/30/2012  . Epiretinal membrane 04/30/2012  . MVP (mitral valve prolapse) 05/24/2011  . Hypercholesterolemia 05/24/2011    Vincente Poli, PT 07/23/2017, 12:31 PM  [facility] [address], [address] [place], [place], [address] Phone: [phone]   Fax:  [phone]  Name: MAGHAN JESSEE MRN: [id number] Date of Birth: August 01, 1934

## 2017-07-25 ENCOUNTER — Ambulatory Visit: Payer: Medicare Other | Admitting: Physical Therapy

## 2017-07-30 ENCOUNTER — Encounter: Payer: Self-pay | Admitting: Physical Therapy

## 2017-07-30 ENCOUNTER — Ambulatory Visit: Payer: Medicare Other | Admitting: Physical Therapy

## 2017-07-30 DIAGNOSIS — M6281 Muscle weakness (generalized): Secondary | ICD-10-CM

## 2017-07-30 DIAGNOSIS — M545 Low back pain: Secondary | ICD-10-CM

## 2017-07-30 DIAGNOSIS — R293 Abnormal posture: Secondary | ICD-10-CM | POA: Diagnosis not present

## 2017-07-30 DIAGNOSIS — G8929 Other chronic pain: Secondary | ICD-10-CM

## 2017-07-30 DIAGNOSIS — R2689 Other abnormalities of gait and mobility: Secondary | ICD-10-CM

## 2017-07-30 NOTE — Therapy (Signed)
[facility] [address] [place], [place], [address] Phone: [phone]   Fax:  [phone]  Physical Therapy Treatment  Patient Details  Name: Madison Odom MRN: [id number] Date of Birth: 09-27-1934 Referring Provider: Dorothyann Peng, NP  Encounter Date: 07/30/2017      PT End of Session - 07/30/17 1137    Visit Number 17   Number of Visits 20   Date for PT Re-Evaluation 08/13/17   Authorization Type gcodes; kx at 15th visit   PT Start Time 1130   PT Stop Time 1210   PT Time Calculation (min) 40 min   Activity Tolerance Patient tolerated treatment well;No increased pain   Behavior During Therapy WFL for tasks assessed/performed      Past Medical History:  Diagnosis Date  . GERD (gastroesophageal reflux disease)   . Hyperlipidemia   . MVP (mitral valve prolapse)   . OP (osteoporosis)   . Pelvis fracture ([place]) 09/2015   CLOSED   . Scoliosis   . Spastic colon     Past Surgical History:  Procedure Laterality Date  . ABDOMINAL HYSTERECTOMY  1965  . APPENDECTOMY    . BILATERAL OOPHORECTOMY  1981  . EYE SURGERY    . ROTATOR CUFF REPAIR Right     There were no vitals filed for this visit.      Subjective Assessment - 07/30/17 1138    Subjective No new complaints.   Pertinent History LBP for several years especially standing in the kitchen. Most recent fall March 2018 picking up paper off of the kitchen floor. Felt like she couldn't walk around grocery store after the fall and then fell again onto daughter amb up curb from store. Denies vertigo. Uses the rollator at home. Lives alone. Has driver.    Currently in Pain? No/denies   Multiple Pain Sites No                         [facility] Adult PT Treatment/Exercise - 07/30/17 0001      Knee/Hip Exercises: Aerobic   Nustep L2 x 12 min  PT present for goal review/status review     Knee/Hip Exercises: Standing   Heel Raises 20 reps  on  black foam mat   Knee Flexion Right;Left;15 reps   Knee Flexion Limitations 2#; at counter    Hip Flexion Right;Left;15 reps;Knee bent   Hip Flexion Limitations 2#; at counter   Hip Abduction Right;Left;15 reps;Knee straight   Abduction Limitations 2#; at counter   Hip Extension Right;Left;15 reps;Knee straight   Extension Limitations 2#; at counter   Other Standing Knee Exercises walking 4 laps of ortho gym: focus on tall walking  Emphasis on tall walking     Knee/Hip Exercises: Seated   Long Arc Quad Strengthening;Both;1 set;20 reps;Weights   Long Arc Quad Weight 2 lbs.   Sit to Sand 10 reps  standing on foam                  PT Short Term Goals - 06/25/17 1109      PT SHORT TERM GOAL #2   Title berg balance score >/= 45/56    Baseline 46/56 (06/21/17)   Status Achieved     PT SHORT TERM GOAL #3   Title Pt will report improved lumbar fatigue/LBP with static standing in kitchen x 25% to prepare a meal   Status Achieved  PT Long Term Goals - 07/30/17 1211      PT LONG TERM GOAL #5   Title go to the grocery store and shop while holding onto a basket by herself due to [employer] balance >/= 55/56   Time 8   Period Weeks   Status Partially Met  Going to store now holding onto cart.                Plan - 07/30/17 1137    Clinical Impression Statement Tried to stand longer for exercises for back strength. Pt could only tolerate one exercise before needing to sit and get a support on her back.  Otherwise we increased with weights on her standing exercises to 2# and tolerated this well. No pain at all, just a very audible "creek" in her knee during sit to stand.   Rehab Potential Excellent   Clinical Impairments Affecting Rehab Potential osteoporosis, R RCR   PT Frequency 2x / week   PT Duration 6 weeks   PT Treatment/Interventions ADLs/Self Care Home Management;Functional mobility training;Stair training;Gait training;DME Instruction;Moist  Heat;Therapeutic activities;Therapeutic exercise;Balance training;Neuromuscular re-education;Patient/family education;Manual techniques   PT Next Visit Plan focus on balance, core and LE strength   Consulted and Agree with Plan of Care Patient      Patient will benefit from skilled therapeutic intervention in order to improve the following deficits and impairments:  Abnormal gait, Decreased activity tolerance, Decreased balance, Decreased mobility, Decreased endurance, Decreased range of motion, Decreased strength, Difficulty walking, Impaired flexibility, Postural dysfunction, Improper body mechanics, Pain  Visit Diagnosis: Abnormal posture  Muscle weakness (generalized)  Other abnormalities of gait and mobility  Chronic midline low back pain without sciatica     Problem List Patient Active Problem List   Diagnosis Date Noted  . Epiretinal membrane, left eye 04/18/2017  . Acute blood loss anemia 09/27/2015  . Pelvic fracture ([place]) 09/21/2015  . Closed fracture of pelvis ([place])   . Hypertensive retinopathy of both eyes 08/16/2015  . Stye external 02/23/2015  . Hypertension 01/28/2014  . Kyphoscoliosis 09/16/2013  . After cataract 02/19/2013  . Pseudophakia of both eyes 01/22/2013  . Posterior capsular opacification 10/29/2012  . Cystoid macular edema 04/30/2012  . Epiretinal membrane 04/30/2012  . MVP (mitral valve prolapse) 05/24/2011  . Hypercholesterolemia 05/24/2011    Abriella Filkins, PTA 07/30/2017, 12:13 PM  [facility] [address], [place], [place], [address] Phone: [phone]   Fax:  [phone]  Name: Madison Odom MRN: [id number] Date of Birth: 1933-12-11

## 2017-07-31 ENCOUNTER — Ambulatory Visit (HOSPITAL_COMMUNITY): Payer: Medicare Other | Attending: Cardiology

## 2017-07-31 ENCOUNTER — Other Ambulatory Visit: Payer: Self-pay

## 2017-07-31 DIAGNOSIS — I10 Essential (primary) hypertension: Secondary | ICD-10-CM

## 2017-07-31 DIAGNOSIS — E785 Hyperlipidemia, unspecified: Secondary | ICD-10-CM | POA: Diagnosis not present

## 2017-07-31 DIAGNOSIS — I083 Combined rheumatic disorders of mitral, aortic and tricuspid valves: Secondary | ICD-10-CM | POA: Diagnosis not present

## 2017-07-31 DIAGNOSIS — I272 Pulmonary hypertension, unspecified: Secondary | ICD-10-CM

## 2017-08-01 ENCOUNTER — Ambulatory Visit: Payer: Medicare Other | Admitting: Physical Therapy

## 2017-08-01 ENCOUNTER — Encounter: Payer: Self-pay | Admitting: Physical Therapy

## 2017-08-01 DIAGNOSIS — G8929 Other chronic pain: Secondary | ICD-10-CM

## 2017-08-01 DIAGNOSIS — R2689 Other abnormalities of gait and mobility: Secondary | ICD-10-CM

## 2017-08-01 DIAGNOSIS — M6281 Muscle weakness (generalized): Secondary | ICD-10-CM

## 2017-08-01 DIAGNOSIS — R293 Abnormal posture: Secondary | ICD-10-CM | POA: Diagnosis not present

## 2017-08-01 DIAGNOSIS — M545 Low back pain: Secondary | ICD-10-CM

## 2017-08-01 NOTE — Therapy (Signed)
[facility] [address] [place], [place], [address] Phone: [phone]   Fax:  [phone]  Physical Therapy Treatment  Patient Details  Name: Madison Odom MRN: [id number] Date of Birth: 07-05-1934 Referring Provider: Dorothyann Peng, NP  Encounter Date: 08/01/2017      PT End of Session - 08/01/17 1147    Visit Number 18   Number of Visits 20   Date for PT Re-Evaluation 08/13/17   Authorization Type gcodes; kx at 15th visit   PT Start Time 1140   PT Stop Time 1224   PT Time Calculation (min) 44 min   Activity Tolerance Patient tolerated treatment well;No increased pain   Behavior During Therapy WFL for tasks assessed/performed      Past Medical History:  Diagnosis Date  . GERD (gastroesophageal reflux disease)   . Hyperlipidemia   . MVP (mitral valve prolapse)   . OP (osteoporosis)   . Pelvis fracture ([place]) 09/2015   CLOSED   . Scoliosis   . Spastic colon     Past Surgical History:  Procedure Laterality Date  . ABDOMINAL HYSTERECTOMY  1965  . APPENDECTOMY    . BILATERAL OOPHORECTOMY  1981  . EYE SURGERY    . ROTATOR CUFF REPAIR Right     There were no vitals filed for this visit.      Subjective Assessment - 08/01/17 1149    Subjective No new complaints. I still have a small fear of falling.    Pertinent History LBP for several years especially standing in the kitchen. Most recent fall March 2018 picking up paper off of the kitchen floor. Felt like she couldn't walk around grocery store after the fall and then fell again onto daughter amb up curb from store. Denies vertigo. Uses the rollator at home. Lives alone. Has driver.    Currently in Pain? No/denies   Multiple Pain Sites No                         [facility] Adult PT Treatment/Exercise - 08/01/17 0001      Knee/Hip Exercises: Aerobic   Nustep L2 x 12 min  PT present for goal review/status review     Knee/Hip Exercises:  Standing   Heel Raises --  heel & toe lift 2x10 Light UE support   Knee Flexion Right;Left;15 reps   Knee Flexion Limitations 2#; at counter    Hip Flexion Right;Left;15 reps;Knee bent   Hip Flexion Limitations 2#; at counter   Hip Abduction Right;Left;15 reps;Knee straight   Abduction Limitations 2#; at counter   Hip Extension Right;Left;15 reps;Knee straight   Extension Limitations 2#; at counter   SLS Alt toe taps on first step. Lt UE on rail light 2x10 SBA   Rebounder weight shifting 3 ways x 1 minute each   Other Standing Knee Exercises walking 4 laps of ortho gym: focus on tall walking  Emphasis on tall walking     Knee/Hip Exercises: Seated   Long Arc Quad Strengthening;Both;1 set;20 reps;Weights   Long Arc Quad Weight 2 lbs.   Sit to Sand 10 reps  sitting on foam                  PT Short Term Goals - 06/25/17 1109      PT SHORT TERM GOAL #2   Title berg balance score >/= 45/56    Baseline 46/56 (06/21/17)   Status Achieved  PT SHORT TERM GOAL #3   Title Pt will report improved lumbar fatigue/LBP with static standing in kitchen x 25% to prepare a meal   Status Achieved           PT Long Term Goals - 07/30/17 1211      PT LONG TERM GOAL #5   Title go to the grocery store and shop while holding onto a basket by herself due to [employer] >/= 55/56   Time 8   Period Weeks   Status Partially Met  Going to store now holding onto cart.                Plan - 08/01/17 1148    Clinical Impression Statement Pt was able to stay standing for the entire standing hip kick series today before needing to sit to rest her back.  We stuck with the 2# weights as they are challenging and pt has a pair of 2# weights at home already. She demonstrates ease and good safety when getting in & out of her chair.     Rehab Potential Excellent   Clinical Impairments Affecting Rehab Potential osteoporosis, R RCR   PT Frequency 2x / week   PT Duration 6 weeks   PT  Treatment/Interventions ADLs/Self Care Home Management;Functional mobility training;Stair training;Gait training;DME Instruction;Moist Heat;Therapeutic activities;Therapeutic exercise;Balance training;Neuromuscular re-education;Patient/family education;Manual techniques   PT Next Visit Plan focus on balance, core and LE strength   Consulted and Agree with Plan of Care Patient      Patient will benefit from skilled therapeutic intervention in order to improve the following deficits and impairments:  Abnormal gait, Decreased activity tolerance, Decreased balance, Decreased mobility, Decreased endurance, Decreased range of motion, Decreased strength, Difficulty walking, Impaired flexibility, Postural dysfunction, Improper body mechanics, Pain  Visit Diagnosis: Abnormal posture  Muscle weakness (generalized)  Other abnormalities of gait and mobility  Chronic midline low back pain without sciatica     Problem List Patient Active Problem List   Diagnosis Date Noted  . Epiretinal membrane, left eye 04/18/2017  . Acute blood loss anemia 09/27/2015  . Pelvic fracture ([place]) 09/21/2015  . Closed fracture of pelvis ([place])   . Hypertensive retinopathy of both eyes 08/16/2015  . Stye external 02/23/2015  . Hypertension 01/28/2014  . Kyphoscoliosis 09/16/2013  . After cataract 02/19/2013  . Pseudophakia of both eyes 01/22/2013  . Posterior capsular opacification 10/29/2012  . Cystoid macular edema 04/30/2012  . Epiretinal membrane 04/30/2012  . MVP (mitral valve prolapse) 05/24/2011  . Hypercholesterolemia 05/24/2011    Fallen Crisostomo, PTA 08/01/2017, 12:38 PM  [facility] [address] [place], [place], [address] Phone: [phone]   Fax:  [phone]  Name: Madison Odom MRN: [id number] Date of Birth: August 19, 1934

## 2017-08-03 ENCOUNTER — Encounter: Payer: Self-pay | Admitting: Adult Health

## 2017-08-03 ENCOUNTER — Telehealth: Payer: Self-pay | Admitting: Cardiology

## 2017-08-03 MED ORDER — FUROSEMIDE 20 MG PO TABS: 20.0000 mg | ORAL_TABLET | Freq: Every day | ORAL | 6 refills | Status: DC | PRN

## 2017-08-03 NOTE — Telephone Encounter (Signed)
Informed the pt that per Dr Delton See, she recommends that she start taking lasix 20 mg po daily PRN on the days she is SOB.  Confirmed the pharmacy of choice with the pt.   Pt verbalized understanding and agrees with this plan.

## 2017-08-03 NOTE — Telephone Encounter (Signed)
Please start lasix 20 mg po daily PRN for days when she is SOB>

## 2017-08-03 NOTE — Telephone Encounter (Signed)
Pt is calling to follow up on echo results endorsed to her from Dr Delton See at the beginning of the week.  Below is what the pt is calling about:  Result Notes   Notes recorded by Loa Socks, LPN on 9/60/4540 at 3:53 PM EDT Notified the pt of her echo results and asked if she having symptoms Dr Delton See mentioned. Pt states that she is still having mild symptoms of sob. Pt reports she is mostly having sob at PT, and early in the morning when she wakes up.  Pt states she has some lower extremity edema only in her right lower extremity, that comes and goes.  Informed the pt that I will send this information to Dr Delton See, and follow-up with her shortly thereafter, once recommendations received.  Pt verbalized understanding and agrees with this plan. ------  Notes recorded by Lars Masson, MD on 08/01/2017 at 1:36 PM EDT Normal LVEF, her pulmonary hypertension has improved and is now just mild. She has elevated filling pressures, please ask her if she feels shortness of breath while walking, orthopnea or personal nocturnal dyspnea or if she has any lower extremity edema.    Informed the pt that I will follow-up with her once further instructions are provided by Dr Delton See.  Pt verbalized understanding and agrees with this plan.

## 2017-08-03 NOTE — Telephone Encounter (Signed)
New Message   pt verbalized that she is returning call for rn  Follow up on ECHO

## 2017-08-06 ENCOUNTER — Ambulatory Visit: Payer: Medicare Other | Admitting: Physical Therapy

## 2017-08-06 ENCOUNTER — Encounter: Payer: Self-pay | Admitting: Physical Therapy

## 2017-08-06 DIAGNOSIS — R293 Abnormal posture: Secondary | ICD-10-CM

## 2017-08-06 DIAGNOSIS — R2689 Other abnormalities of gait and mobility: Secondary | ICD-10-CM

## 2017-08-06 DIAGNOSIS — G8929 Other chronic pain: Secondary | ICD-10-CM

## 2017-08-06 DIAGNOSIS — M6281 Muscle weakness (generalized): Secondary | ICD-10-CM

## 2017-08-06 DIAGNOSIS — M545 Low back pain: Secondary | ICD-10-CM

## 2017-08-06 NOTE — Therapy (Signed)
[facility] [address], [place], [place], [address] Phone: [phone]   Fax:  [phone]  Physical Therapy Treatment  Patient Details  Name: Madison Odom MRN: [id number] Date of Birth: [birth year] Referring Provider: Dorothyann Peng, NP  Encounter Date: 08/06/2017      PT End of Session - 08/06/17 1022    Visit Number 19   Number of Visits 20   Date for PT Re-Evaluation 08/13/17   Authorization Type gcodes; kx at 15th visit   PT Start Time 1015   PT Stop Time 1100   PT Time Calculation (min) 45 min   Activity Tolerance Patient tolerated treatment well;No increased pain   Behavior During Therapy WFL for tasks assessed/performed      Past Medical History:  Diagnosis Date  . GERD (gastroesophageal reflux disease)   . Hyperlipidemia   . MVP (mitral valve prolapse)   . OP (osteoporosis)   . Pelvis fracture ([place]) 09/2015   CLOSED   . Scoliosis   . Spastic colon     Past Surgical History:  Procedure Laterality Date  . ABDOMINAL HYSTERECTOMY  1965  . APPENDECTOMY    . BILATERAL OOPHORECTOMY  1981  . EYE SURGERY    . ROTATOR CUFF REPAIR Right     There were no vitals filed for this visit.      Subjective Assessment - 08/06/17 1024    Subjective I am going to bring my cane next time. I want to walk with it around my house safely.   Currently in Pain? No/denies   Multiple Pain Sites No                         [facility] Adult PT Treatment/Exercise - 08/06/17 0001      Knee/Hip Exercises: Aerobic   Nustep L2 x 12 min  PT present for goal review/status review     Knee/Hip Exercises: Standing   Heel Raises --  heel & toe lift 2x10 Light UE support   Knee Flexion Right;Left;15 reps   Knee Flexion Limitations 2#; at counter    Hip Flexion Right;Left;15 reps;Knee bent   Hip Flexion Limitations 2#; at counter   Hip Abduction Right;Left;15 reps;Knee straight   Abduction Limitations 2#; at  counter   Hip Extension Right;Left;15 reps;Knee straight   Extension Limitations 2#; at counter   SLS Alt toe taps on first step. Lt UE on rail light 2x10 SBA  second set no UE support, PTA CGA light   Rebounder weight shifting 3 ways x 1 minute each   Other Standing Knee Exercises walking 4 laps of ortho gym: focus on tall walking  Emphasis on tall walking     Knee/Hip Exercises: Seated   Long Arc Quad Strengthening;Both;3 sets;10 reps   Long Arc Quad Weight 2 lbs.   Sit to [employer] 10 reps                  PT Short Term Goals - 06/25/17 1109      PT SHORT TERM GOAL #2   Title berg balance score >/= 45/56    Baseline 46/56 (06/21/17)   Status Achieved     PT SHORT TERM GOAL #3   Title Pt will report improved lumbar fatigue/LBP with static standing in kitchen x 25% to prepare a meal   Status Achieved           PT Long Term Goals - 07/30/17 6761  PT LONG TERM GOAL #5   Title go to the grocery store and shop while holding onto a basket by herself due to [employer] >/= 55/56   Time 8   Period Weeks   Status Partially Met  Going to store now holding onto cart.                Plan - 08/06/17 1023    Clinical Impression Statement Pt has verbalized the desire to start working with her cane in PT as she would like to walk in her home with just a cane. She is more comfortable performing standing exercises with less gripping and today used no hands but needed PTA to touch her for confidence.    Rehab Potential Excellent   Clinical Impairments Affecting Rehab Potential osteoporosis, R RCR   PT Frequency 2x / week   PT Duration 6 weeks   PT Treatment/Interventions ADLs/Self Care Home Management;Functional mobility training;Stair training;Gait training;DME Instruction;Moist Heat;Therapeutic activities;Therapeutic exercise;Balance training;Neuromuscular re-education;Patient/family education;Manual techniques   PT Next Visit Plan Renewal? 20th visit G codes: pt  wants to work on working with cane.    Consulted and Agree with Plan of Care --      Patient will benefit from skilled therapeutic intervention in order to improve the following deficits and impairments:  Abnormal gait, Decreased activity tolerance, Decreased balance, Decreased mobility, Decreased endurance, Decreased range of motion, Decreased strength, Difficulty walking, Impaired flexibility, Postural dysfunction, Improper body mechanics, Pain  Visit Diagnosis: Abnormal posture  Muscle weakness (generalized)  Other abnormalities of gait and mobility  Chronic midline low back pain without sciatica     Problem List Patient Active Problem List   Diagnosis Date Noted  . Epiretinal membrane, left eye 04/18/2017  . Acute blood loss anemia 09/27/2015  . Pelvic fracture ([place]) 09/21/2015  . Closed fracture of pelvis ([place])   . Hypertensive retinopathy of both eyes 08/16/2015  . Stye external 02/23/2015  . Hypertension 01/28/2014  . Kyphoscoliosis 09/16/2013  . After cataract 02/19/2013  . Pseudophakia of both eyes 01/22/2013  . Posterior capsular opacification 10/29/2012  . Cystoid macular edema 04/30/2012  . Epiretinal membrane 04/30/2012  . MVP (mitral valve prolapse) 05/24/2011  . Hypercholesterolemia 05/24/2011    Chaneka Trefz, PTA 08/06/2017, 10:54 AM  [facility] [address] [place], [place], [address] Phone: [phone]   Fax:  [phone]  Name: Madison Odom MRN: [id number] Date of Birth: 10-18-1934

## 2017-08-08 ENCOUNTER — Encounter: Payer: Self-pay | Admitting: Physical Therapy

## 2017-08-08 ENCOUNTER — Ambulatory Visit: Payer: Medicare Other | Admitting: Physical Therapy

## 2017-08-08 DIAGNOSIS — G8929 Other chronic pain: Secondary | ICD-10-CM

## 2017-08-08 DIAGNOSIS — R293 Abnormal posture: Secondary | ICD-10-CM

## 2017-08-08 DIAGNOSIS — M6281 Muscle weakness (generalized): Secondary | ICD-10-CM

## 2017-08-08 DIAGNOSIS — M545 Low back pain, unspecified: Secondary | ICD-10-CM

## 2017-08-08 DIAGNOSIS — R2689 Other abnormalities of gait and mobility: Secondary | ICD-10-CM

## 2017-08-08 NOTE — Therapy (Signed)
[facility] [address] [place], [place], [address] Phone: [phone]   Fax:  [phone]  Physical Therapy Treatment  Patient Details  Name: Madison Odom MRN: [id number] Date of Birth: Jul 29, 1934 Referring Provider: Dorothyann Peng, NP  Encounter Date: 08/08/2017      PT End of Session - 08/08/17 1155    Visit Number 20   Number of Visits 30   Date for PT Re-Evaluation 09/05/17   Authorization Type gcodes; kx at 15th visit   PT Start Time 1147   PT Stop Time 1227   PT Time Calculation (min) 40 min   Activity Tolerance Patient tolerated treatment well;No increased pain   Behavior During Therapy WFL for tasks assessed/performed      Past Medical History:  Diagnosis Date  . GERD (gastroesophageal reflux disease)   . Hyperlipidemia   . MVP (mitral valve prolapse)   . OP (osteoporosis)   . Pelvis fracture ([place]) 09/2015   CLOSED   . Scoliosis   . Spastic colon     Past Surgical History:  Procedure Laterality Date  . ABDOMINAL HYSTERECTOMY  1965  . APPENDECTOMY    . BILATERAL OOPHORECTOMY  1981  . EYE SURGERY    . ROTATOR CUFF REPAIR Right     There were no vitals filed for this visit.      Subjective Assessment - 08/08/17 1154    Subjective I want to walk with the cane again, but nervous to try on my own at home.  My back bothers me after standing for a while.  It    Pertinent History LBP for several years especially standing in the kitchen. Most recent fall March 2018 picking up paper off of the kitchen floor. Felt like she couldn't walk around grocery store after the fall and then fell again onto daughter amb up curb from store. Denies vertigo. Uses the rollator at home. Lives alone. Has driver.    Limitations Sitting;Lifting;Standing;Walking   How long can you stand comfortably? 1 hour   Patient Stated Goals to be able to walk in the house without any help and use the cane outside   Currently in  Pain? No/denies                         [facility] Adult PT Treatment/Exercise - 08/08/17 0001      Ambulation/Gait   Ambulation/Gait Yes   Ambulation/Gait Assistance 4: Min guard;5: Supervision   Ambulation/Gait Assistance Details SPC; occasional min assist for LOB   Ambulation Distance (Feet) 100 Feet     Standardized Balance Assessment   Standardized Balance Assessment Timed Up and Go Test     Berg Balance Test   Sit to Stand Able to stand without using hands and stabilize independently   Standing Unsupported Able to stand safely 2 minutes   Sitting with Back Unsupported but Feet Supported on Floor or Stool Able to sit safely and securely 2 minutes   Stand to Sit Sits safely with minimal use of hands   Transfers Able to transfer safely, minor use of hands   Standing Unsupported with Eyes Closed Able to stand 10 seconds safely   Standing Ubsupported with Feet Together Able to place feet together independently and stand 1 minute safely   From Standing, Reach Forward with Outstretched Arm Can reach forward >5 cm safely (2")   From Standing Position, Pick up Object from Floor Able to pick up shoe, needs  supervision   From Standing Position, Turn to Look Behind Over each Shoulder Looks behind from both sides and weight shifts well   Turn 360 Degrees Able to turn 360 degrees safely but slowly   Standing Unsupported, Alternately Place Feet on Step/Stool Able to stand independently and safely and complete 8 steps in 20 seconds   Standing Unsupported, One Foot in Front Able to plae foot ahead of the other independently and hold 30 seconds  tandem on one side, min assistx1 on other side   Standing on One Leg Able to lift leg independently and hold equal to or more than 3 seconds  10 sec Lt LE   Total Score 48     Timed Up and Go Test   TUG Normal TUG   Normal TUG (seconds) 45  straight     Therapeutic Activites    Therapeutic Activities ADL's   ADL's walking with cane -  cues to stand up tall close supervision                  PT Short Term Goals - 06/25/17 1109      PT SHORT TERM GOAL #2   Title berg balance score >/= 45/56    Baseline 46/56 (06/21/17)   Status Achieved     PT SHORT TERM GOAL #3   Title Pt will report improved lumbar fatigue/LBP with static standing in kitchen x 25% to prepare a meal   Status Achieved           PT Long Term Goals - 08/08/17 1158      PT LONG TERM GOAL #1   Title Pt will demo improved bil LE strength >/= 4+/5 all major muscle groups to assist with decreased falls risk   Baseline bil hip flex 4-, bil knee ext 4+, bil knee flex 4+, bil DF 4-, bilhip abdct 4+/5, bil hip ext 4/5 (07/02/17)   Time 4   Period Weeks   Status On-going   Target Date 09/05/17     PT LONG TERM GOAL #2   Title Berg balance score >/= 50/56   Baseline 48/56   Time 4   Period Weeks   Status On-going   Target Date 09/05/17     PT LONG TERM GOAL #3   Title Pt will report no LBP/lumbar fatigue with WB activities >/= 45 min   Status Achieved     PT LONG TERM GOAL #4   Title pt will demo improved lumbar/core strength to good + to assist with decreased LBP with amb in grocery store   Status Achieved     PT LONG TERM GOAL #5   Title go to the grocery store and shop while holding onto a basket by herself due to Berg balance >/= 55/56   Baseline have been shopping and able to stay 45 minutes   Time 4   Period Weeks   Status Partially Met   Target Date 09/05/17     Additional Long Term Goals   Additional Long Term Goals Yes     PT LONG TERM GOAL #6   Title TUG with straight cane </= to 35 sec for reduced risk of falls   Time 4   Period Weeks   Status New   Target Date 09/05/17               Plan - 08/08/17 1157    Clinical Impression Statement Patient is demonstrating good progress with balance.  She is able to  walk with cane with close supervision.  She has some unsteadiness with turns and very slow gait  speed.  TUG is 45 seconds with cane demonstrating high fall risk.  She continues to have some LE weakness.  Patient will benefit from skilled PT to continue to work on balance and gait to reduce risk of falls   PT Treatment/Interventions ADLs/Self Care Home Management;Functional mobility training;Stair training;Gait training;DME Instruction;Moist Heat;Therapeutic activities;Therapeutic exercise;Balance training;Neuromuscular re-education;Patient/family education;Manual techniques   PT Next Visit Plan work on gait with cane, balance and posture    Consulted and Agree with Plan of Care Patient      Patient will benefit from skilled therapeutic intervention in order to improve the following deficits and impairments:  Abnormal gait, Decreased activity tolerance, Decreased balance, Decreased mobility, Decreased endurance, Decreased range of motion, Decreased strength, Difficulty walking, Impaired flexibility, Postural dysfunction, Improper body mechanics, Pain  Visit Diagnosis: Abnormal posture - Plan: PT plan of care cert/re-cert  Muscle weakness (generalized) - Plan: PT plan of care cert/re-cert  Other abnormalities of gait and mobility - Plan: PT plan of care cert/re-cert  Chronic midline low back pain without sciatica - Plan: PT plan of care cert/re-cert       G-Codes - August 28, 2017 1227    Functional Assessment Tool Used (Outpatient Only) Berg 48/56   Functional Limitation Mobility: Walking and moving around   Mobility: Walking and Moving Around Current Status (W8088) At least 20 percent but less than 40 percent impaired, limited or restricted   Mobility: Walking and Moving Around Goal Status (P1031) At least 1 percent but less than 20 percent impaired, limited or restricted      Problem List Patient Active Problem List   Diagnosis Date Noted  . Epiretinal membrane, left eye 04/18/2017  . Acute blood loss anemia 09/27/2015  . Pelvic fracture ([place]) 09/21/2015  . Closed fracture of  pelvis ([place])   . Hypertensive retinopathy of both eyes 08/16/2015  . Stye external 02/23/2015  . Hypertension 01/28/2014  . Kyphoscoliosis 09/16/2013  . After cataract 02/19/2013  . Pseudophakia of both eyes 01/22/2013  . Posterior capsular opacification 10/29/2012  . Cystoid macular edema 04/30/2012  . Epiretinal membrane 04/30/2012  . MVP (mitral valve prolapse) 05/24/2011  . Hypercholesterolemia 05/24/2011    Zannie Cove, PT 28-Aug-2017, 1:19 PM  [facility] [address], [place], [place], [address] Phone: [phone]   Fax:  [phone]  Name: Madison Odom MRN: [id number] Date of Birth: 02/19/1934

## 2017-08-13 ENCOUNTER — Encounter: Payer: Self-pay | Admitting: Physical Therapy

## 2017-08-13 ENCOUNTER — Ambulatory Visit: Payer: Medicare Other | Attending: Adult Health | Admitting: Physical Therapy

## 2017-08-13 DIAGNOSIS — R293 Abnormal posture: Secondary | ICD-10-CM | POA: Diagnosis present

## 2017-08-13 DIAGNOSIS — M6281 Muscle weakness (generalized): Secondary | ICD-10-CM | POA: Diagnosis present

## 2017-08-13 DIAGNOSIS — R2689 Other abnormalities of gait and mobility: Secondary | ICD-10-CM | POA: Diagnosis present

## 2017-08-13 DIAGNOSIS — M545 Low back pain, unspecified: Secondary | ICD-10-CM

## 2017-08-13 DIAGNOSIS — G8929 Other chronic pain: Secondary | ICD-10-CM | POA: Diagnosis present

## 2017-08-13 NOTE — Therapy (Signed)
[facility] [address], [address] [place], [place], [address] Phone: [phone]   Fax:  [phone]  Physical Therapy Treatment  Patient Details  Name: Madison Odom MRN: [id number] Date of Birth: 1934-04-14 Referring Provider: [name], NP  Encounter Date: 08/13/2017      PT End of Session - 08/13/17 1135    Visit Number 21   Number of Visits 30   Date for PT Re-Evaluation 09/05/17   PT Start Time 1135   PT Stop Time 1215   PT Time Calculation (min) 40 min   Activity Tolerance Patient tolerated treatment well;No increased pain   Behavior During Therapy WFL for tasks assessed/performed      Past Medical History:  Diagnosis Date  . GERD (gastroesophageal reflux disease)   . Hyperlipidemia   . MVP (mitral valve prolapse)   . OP (osteoporosis)   . Pelvis fracture ([facility]) 09/2015   CLOSED   . Scoliosis   . Spastic colon     Past Surgical History:  Procedure Laterality Date  . ABDOMINAL HYSTERECTOMY  1965  . APPENDECTOMY    . BILATERAL OOPHORECTOMY  1981  . EYE SURGERY    . ROTATOR CUFF REPAIR Right     There were no vitals filed for this visit.      Subjective Assessment - 08/13/17 1136    Subjective I have decided to not try to walk with the cane.    Currently in Pain? No/denies   Multiple Pain Sites No                         [facility] Adult PT Treatment/Exercise - 08/13/17 0001      Neuro Re-ed    Neuro Re-ed Details  Static and dynamic  Stand on black pad for various balnce activites: SBA     Knee/Hip Exercises: Aerobic   Nustep L2 x 12 min  PT present for goal review/status review     Knee/Hip Exercises: Seated   Long Arc Quad AROM;Strengthening;Both;3 sets;10 reps   Long Arc Quad Weight 3 lbs.   Clamshell with TheraBand Red  3x10                  PT Short Term Goals - 06/25/17 1109      PT SHORT TERM GOAL #2   Title berg balance score >/= 45/56    Baseline  46/56 (06/21/17)   Status Achieved     PT SHORT TERM GOAL #3   Title Pt will report improved lumbar fatigue/LBP with static standing in kitchen x 25% to prepare a meal   Status Achieved           PT Long Term Goals - 08/13/17 1212      PT LONG TERM GOAL #6   Title TUG with straight cane </= to 35 sec for reduced risk of falls   Time 4   Period Weeks   Status Deferred               Plan - 08/13/17 1135    Clinical Impression Statement Pt has decided to not pursue walking with the cane at this time. We worked on longer standing times to incorporate trunk strength and standing balance that will help with longer, more comfortable standing at home.    Rehab Potential Excellent   Clinical Impairments Affecting Rehab Potential osteoporosis, R RCR   PT Frequency 2x / week   PT Duration 6  weeks   PT Treatment/Interventions ADLs/Self Care Home Management;Functional mobility training;Stair training;Gait training;DME Instruction;Moist Heat;Therapeutic activities;Therapeutic exercise;Balance training;Neuromuscular re-education;Patient/family education;Manual techniques   PT Next Visit Plan Balance and posture,    Consulted and Agree with Plan of Care Patient      Patient will benefit from skilled therapeutic intervention in order to improve the following deficits and impairments:  Abnormal gait, Decreased activity tolerance, Decreased balance, Decreased mobility, Decreased endurance, Decreased range of motion, Decreased strength, Difficulty walking, Impaired flexibility, Postural dysfunction, Improper body mechanics, Pain  Visit Diagnosis: Abnormal posture  Muscle weakness (generalized)  Other abnormalities of gait and mobility  Chronic midline low back pain without sciatica     Problem List Patient Active Problem List   Diagnosis Date Noted  . Epiretinal membrane, left eye 04/18/2017  . Acute blood loss anemia 09/27/2015  . Pelvic fracture ([facility]) 09/21/2015  . Closed  fracture of pelvis ([facility])   . Hypertensive retinopathy of both eyes 08/16/2015  . Stye external 02/23/2015  . Hypertension 01/28/2014  . Kyphoscoliosis 09/16/2013  . After cataract 02/19/2013  . Pseudophakia of both eyes 01/22/2013  . Posterior capsular opacification 10/29/2012  . Cystoid macular edema 04/30/2012  . Epiretinal membrane 04/30/2012  . MVP (mitral valve prolapse) 05/24/2011  . Hypercholesterolemia 05/24/2011    Madison Odom 08/13/2017, 12:14 PM  [facility] [address] [place], [place], [address] Phone: [phone]   Fax:  [phone]  Name: Madison Odom MRN: [id number] Date of Birth: 11/20/1933

## 2017-08-15 ENCOUNTER — Ambulatory Visit: Payer: Medicare Other | Admitting: Physical Therapy

## 2017-08-15 ENCOUNTER — Encounter: Payer: Self-pay | Admitting: Physical Therapy

## 2017-08-15 DIAGNOSIS — R2689 Other abnormalities of gait and mobility: Secondary | ICD-10-CM

## 2017-08-15 DIAGNOSIS — M545 Low back pain: Secondary | ICD-10-CM

## 2017-08-15 DIAGNOSIS — R293 Abnormal posture: Secondary | ICD-10-CM | POA: Diagnosis not present

## 2017-08-15 DIAGNOSIS — M6281 Muscle weakness (generalized): Secondary | ICD-10-CM

## 2017-08-15 DIAGNOSIS — G8929 Other chronic pain: Secondary | ICD-10-CM

## 2017-08-15 NOTE — Therapy (Signed)
[facility] [address] [place], [place], [address] Phone: [phone]   Fax:  [phone]  Physical Therapy Treatment  Patient Details  Name: Madison Odom MRN: [id number] Date of Birth: 1934-01-02 Referring Provider: [name], NP  Encounter Date: 08/15/2017      PT End of Session - 08/15/17 1144    Visit Number 22   Number of Visits 30   Date for PT Re-Evaluation 09/05/17   Authorization Type gcodes; kx at 15th visit   PT Start Time 1140   PT Stop Time 1220   PT Time Calculation (min) 40 min   Activity Tolerance Patient tolerated treatment well;No increased pain   Behavior During Therapy WFL for tasks assessed/performed      Past Medical History:  Diagnosis Date  . GERD (gastroesophageal reflux disease)   . Hyperlipidemia   . MVP (mitral valve prolapse)   . OP (osteoporosis)   . Pelvis fracture ([facility]) 09/2015   CLOSED   . Scoliosis   . Spastic colon     Past Surgical History:  Procedure Laterality Date  . ABDOMINAL HYSTERECTOMY  1965  . APPENDECTOMY    . BILATERAL OOPHORECTOMY  1981  . EYE SURGERY    . ROTATOR CUFF REPAIR Right     There were no vitals filed for this visit.      Subjective Assessment - 08/15/17 1145    Subjective No new complaints today.   Currently in Pain? No/denies   Multiple Pain Sites No                         [facility] Adult PT Treatment/Exercise - 08/15/17 0001      Neuro Re-ed    Neuro Re-ed Details  Static and dynamic  Stand on black pad for various balance activites: SBA     Knee/Hip Exercises: Aerobic   Nustep L2 x 12 min  PT present for goal review/status review     Knee/Hip Exercises: Seated   Long Arc Quad AROM;Strengthening;Both;3 sets;10 reps   Long Arc Quad Weight 3 lbs.   Clamshell with [employer]ra[employer] Red  3x10                  PT Short Term Goals - 06/25/17 1109      PT SHORT TERM GOAL #2   Title berg balance score  >/= 45/56    Baseline 46/56 (06/21/17)   Status Achieved     PT SHORT TERM GOAL #3   Title Pt will report improved lumbar fatigue/LBP with static standing in kitchen x 25% to prepare a meal   Status Achieved           PT Long Term Goals - 08/13/17 1212      PT LONG TERM GOAL #6   Title TUG with straight cane </= to 35 sec for reduced risk of falls   Time 4   Period Weeks   Status Deferred               Plan - 08/15/17 1144    Clinical Impression Statement Pt presented with no pain and no complaints of more standing balance exercises pereformed last session. Pt works hard on standing as erect as she can, recorrecting often without prompting. Pt on track for discharge on the 24th when she has her reevaluation.    Rehab Potential Excellent   Clinical Impairments Affecting Rehab Potential osteoporosis, R RCR   PT Frequency  2x / week   PT Duration 6 weeks   PT Treatment/Interventions ADLs/Self Care Home Management;Functional mobility training;Stair training;Gait training;DME Instruction;Moist Heat;Therapeutic activities;Therapeutic exercise;Balance training;Neuromuscular re-education;Patient/family education;Manual techniques   PT Next Visit Plan Balance and posture, Anticipate DC on the 24th.   Consulted and Agree with Plan of Care Patient      Patient will benefit from skilled therapeutic intervention in order to improve the following deficits and impairments:  Abnormal gait, Decreased activity tolerance, Decreased balance, Decreased mobility, Decreased endurance, Decreased range of motion, Decreased strength, Difficulty walking, Impaired flexibility, Postural dysfunction, Improper body mechanics, Pain  Visit Diagnosis: Abnormal posture  Muscle weakness (generalized)  Other abnormalities of gait and mobility  Chronic midline low back pain without sciatica     Problem List Patient Active Problem List   Diagnosis Date Noted  . Epiretinal membrane, left eye  04/18/2017  . Acute blood loss anemia 09/27/2015  . Pelvic fracture ([facility]) 09/21/2015  . Closed fracture of pelvis ([facility])   . Hypertensive retinopathy of both eyes 08/16/2015  . Stye external 02/23/2015  . Hypertension 01/28/2014  . Kyphoscoliosis 09/16/2013  . After cataract 02/19/2013  . Pseudophakia of both eyes 01/22/2013  . Posterior capsular opacification 10/29/2012  . Cystoid macular edema 04/30/2012  . Epiretinal membrane 04/30/2012  . MVP (mitral valve prolapse) 05/24/2011  . Hypercholesterolemia 05/24/2011    Keziah Avis, PTA 08/15/2017, 12:10 PM  [facility] [address] [place], [place], [address] Phone: [phone]   Fax:  [phone]  Name: MONSERRATE BLASCHKE MRN: [id number] Date of Birth: Jul 29, 1934

## 2017-08-20 ENCOUNTER — Encounter: Payer: Self-pay | Admitting: Physical Therapy

## 2017-08-20 ENCOUNTER — Ambulatory Visit: Payer: Medicare Other | Admitting: Physical Therapy

## 2017-08-20 DIAGNOSIS — R293 Abnormal posture: Secondary | ICD-10-CM | POA: Diagnosis not present

## 2017-08-20 DIAGNOSIS — M545 Low back pain: Secondary | ICD-10-CM

## 2017-08-20 DIAGNOSIS — R2689 Other abnormalities of gait and mobility: Secondary | ICD-10-CM

## 2017-08-20 DIAGNOSIS — M6281 Muscle weakness (generalized): Secondary | ICD-10-CM

## 2017-08-20 DIAGNOSIS — G8929 Other chronic pain: Secondary | ICD-10-CM

## 2017-08-20 NOTE — Therapy (Signed)
[facility] [address], [address] [place], [place], [address] Phone: [phone]   Fax:  [phone]  Physical Therapy Treatment  Patient Details  Name: Madison Odom MRN: [id number] Date of Birth: 11/16/1933 Referring Provider: [name], NP  Encounter Date: 08/20/2017      PT End of Session - 08/20/17 1139    Visit Number 23   Number of Visits 30   Date for PT Re-Evaluation 09/05/17   Authorization Type gcodes; kx at 15th visit   PT Start Time 1136   PT Stop Time 1215   PT Time Calculation (min) 39 min   Activity Tolerance Patient tolerated treatment well;No increased pain   Behavior During Therapy WFL for tasks assessed/performed      Past Medical History:  Diagnosis Date  . GERD (gastroesophageal reflux disease)   . Hyperlipidemia   . MVP (mitral valve prolapse)   . OP (osteoporosis)   . Pelvis fracture (HCC) 09/2015   CLOSED   . Scoliosis   . Spastic colon     Past Surgical History:  Procedure Laterality Date  . ABDOMINAL HYSTERECTOMY  1965  . APPENDECTOMY    . BILATERAL OOPHORECTOMY  1981  . EYE SURGERY    . ROTATOR CUFF REPAIR Right     There were no vitals filed for this visit.      Subjective Assessment - 08/20/17 1140    Subjective No new complaints today.   Currently in Pain? No/denies   Multiple Pain Sites No                         [facility] Adult PT Treatment/Exercise - 08/20/17 0001      Ambulation/Gait   Ambulation/Gait Yes   Ambulation/Gait Assistance 6: Modified independent (Device/Increase time)   Ambulation Distance (Feet) 150 Feet   Assistive device Rolling walker   Ambulation Surface Unlevel;Outdoor;Other (comment)  sidewalk     Neuro Re-ed    Neuro Re-ed Details  Static and dynamic  Stand on black pad for various balance activites: SBA     Knee/Hip Exercises: Aerobic   Nustep L2 x 12 min  PT present for goal review/status review                   PT Short Term Goals - 06/25/17 1109      PT SHORT TERM GOAL #2   Title berg balance score >/= 45/56    Baseline 46/56 (06/21/17)   Status Achieved     PT SHORT TERM GOAL #3   Title Pt will report improved lumbar fatigue/LBP with static standing in kitchen x 25% to prepare a meal   Status Achieved           PT Long Term Goals - 08/13/17 1212      PT LONG TERM GOAL #6   Title TUG with straight cane </= to 35 sec for reduced risk of falls   Time 4   Period Weeks   Status Deferred               Plan - 08/20/17 1139    Clinical Impression Statement Pt reports today she has less of a fear of falling now when she walks. Pt demonstrated the ability to walk on the sidewalk outdoors with SBA only, no wobbliness observed. pt was also to perform her static balance exercises on the foam mat with no UE support but light CGA from PTA as pt  did demonstrate mild unsteadiness. She continually corrects her posture in standing without verbal cuing.    Rehab Potential Excellent   Clinical Impairments Affecting Rehab Potential osteoporosis, R RCR   PT Frequency 2x / week   PT Duration 6 weeks   PT Treatment/Interventions ADLs/Self Care Home Management;Functional mobility training;Stair training;Gait training;DME Instruction;Moist Heat;Therapeutic activities;Therapeutic exercise;Balance training;Neuromuscular re-education;Patient/family education;Manual techniques   PT Next Visit Plan Balance and posture, Anticipate DC on the 24th.   Consulted and Agree with Plan of Care --      Patient will benefit from skilled therapeutic intervention in order to improve the following deficits and impairments:  Abnormal gait, Decreased activity tolerance, Decreased balance, Decreased mobility, Decreased endurance, Decreased range of motion, Decreased strength, Difficulty walking, Impaired flexibility, Postural dysfunction, Improper body mechanics, Pain  Visit Diagnosis: Abnormal  posture  Muscle weakness (generalized)  Other abnormalities of gait and mobility  Chronic midline low back pain without sciatica     Problem List Patient Active Problem List   Diagnosis Date Noted  . Epiretinal membrane, left eye 04/18/2017  . Acute blood loss anemia 09/27/2015  . Pelvic fracture (HCC) 09/21/2015  . Closed fracture of pelvis (HCC)   . Hypertensive retinopathy of both eyes 08/16/2015  . Stye external 02/23/2015  . Hypertension 01/28/2014  . Kyphoscoliosis 09/16/2013  . After cataract 02/19/2013  . Pseudophakia of both eyes 01/22/2013  . Posterior capsular opacification 10/29/2012  . Cystoid macular edema 04/30/2012  . Epiretinal membrane 04/30/2012  . MVP (mitral valve prolapse) 05/24/2011  . Hypercholesterolemia 05/24/2011    COCHRAN,JENNIFER, PTA 08/20/2017, 12:17 PM  [facility] [address], [address] [place], [place], [address] Phone: [phone]   Fax:  [phone]  Name: DARRIA CORVERA MRN: [id number] Date of Birth: 02/15/1934

## 2017-08-22 ENCOUNTER — Encounter: Payer: Self-pay | Admitting: Physical Therapy

## 2017-08-22 ENCOUNTER — Ambulatory Visit: Payer: Medicare Other | Admitting: Physical Therapy

## 2017-08-22 DIAGNOSIS — G8929 Other chronic pain: Secondary | ICD-10-CM

## 2017-08-22 DIAGNOSIS — M6281 Muscle weakness (generalized): Secondary | ICD-10-CM

## 2017-08-22 DIAGNOSIS — R2689 Other abnormalities of gait and mobility: Secondary | ICD-10-CM

## 2017-08-22 DIAGNOSIS — R293 Abnormal posture: Secondary | ICD-10-CM | POA: Diagnosis not present

## 2017-08-22 DIAGNOSIS — M545 Low back pain: Secondary | ICD-10-CM

## 2017-08-22 NOTE — Therapy (Signed)
[facility] [address] [place], [place], [address] Phone: [phone]   Fax:  [phone]  Physical Therapy Treatment  Patient Details  Name: Madison Odom MRN: [id number] Date of Birth: May 11, 1934 Referring Provider: [name], NP  Encounter Date: 08/22/2017      PT End of Session - 08/22/17 1141    Visit Number 24   Number of Visits 30   Date for PT Re-Evaluation 09/05/17   Authorization Type gcodes; kx at 15th visit   PT Start Time 1139   PT Stop Time 1220   PT Time Calculation (min) 41 min   Activity Tolerance Patient tolerated treatment well;No increased pain   Behavior During Therapy WFL for tasks assessed/performed      Past Medical History:  Diagnosis Date  . GERD (gastroesophageal reflux disease)   . Hyperlipidemia   . MVP (mitral valve prolapse)   . OP (osteoporosis)   . Pelvis fracture (HCC) 09/2015   CLOSED   . Scoliosis   . Spastic colon     Past Surgical History:  Procedure Laterality Date  . ABDOMINAL HYSTERECTOMY  1965  . APPENDECTOMY    . BILATERAL OOPHORECTOMY  1981  . EYE SURGERY    . ROTATOR CUFF REPAIR Right     There were no vitals filed for this visit.      Subjective Assessment - 08/22/17 1142    Subjective After last session I went shopping for over 2 hours and only had to take one little rest break.    Currently in Pain? No/denies   Multiple Pain Sites No                         [facility] Adult PT Treatment/Exercise - 08/22/17 0001      Neuro Re-ed    Neuro Re-ed Details  Static and dynamic  Stand on black pad for various balance activites: SBA     Lumbar Exercises: Standing   Other Standing Lumbar Exercises 4 laps around the gym working on upright posture     Knee/Hip Exercises: Aerobic   Nustep L2 x 8 min  Stopped at 8 min today due to short of breath, rested was ok     Knee/Hip Exercises: Seated   Long Arc Quad Strengthening;Both;2  sets;10 reps;Weights   Long Arc Quad Weight 3 lbs.   Clamshell with TheraBand Red  3x10   Knee/Hip Flexion 2.5# 2x10                  PT Short Term Goals - 06/25/17 1109      PT SHORT TERM GOAL #2   Title berg balance score >/= 45/56    Baseline 46/56 (06/21/17)   Status Achieved     PT SHORT TERM GOAL #3   Title Pt will report improved lumbar fatigue/LBP with static standing in kitchen x 25% to prepare a meal   Status Achieved           PT Long Term Goals - 08/13/17 1212      PT LONG TERM GOAL #6   Title TUG with straight cane </= to 35 sec for reduced risk of falls   Time 4   Period Weeks   Status Deferred               Plan - 08/22/17 1141    Clinical Impression Statement Pt had some unusual shortness of breath while on the Nustep causing  her to stop. After about 5 min she reported feeling back to normal. She reports not taking a medication for her heartthis AM and this was probably why she felt SOB.  She did not repeat symptoms at any time during the sesision. She reports that she is significantly more confident in her balance at this time.    Rehab Potential Excellent   Clinical Impairments Affecting Rehab Potential osteoporosis, R RCR   PT Frequency 2x / week   PT Duration 6 weeks   PT Treatment/Interventions ADLs/Self Care Home Management;Functional mobility training;Stair training;Gait training;DME Instruction;Moist Heat;Therapeutic activities;Therapeutic exercise;Balance training;Neuromuscular re-education;Patient/family education;Manual techniques   PT Next Visit Plan Balance and posture, Anticipate DC on the 24th.   Consulted and Agree with Plan of Care Patient      Patient will benefit from skilled therapeutic intervention in order to improve the following deficits and impairments:  Abnormal gait, Decreased activity tolerance, Decreased balance, Decreased mobility, Decreased endurance, Decreased range of motion, Decreased strength, Difficulty  walking, Impaired flexibility, Postural dysfunction, Improper body mechanics, Pain  Visit Diagnosis: Abnormal posture  Muscle weakness (generalized)  Other abnormalities of gait and mobility  Chronic midline low back pain without sciatica     Problem List Patient Active Problem List   Diagnosis Date Noted  . Epiretinal membrane, left eye 04/18/2017  . Acute blood loss anemia 09/27/2015  . Pelvic fracture (HCC) 09/21/2015  . Closed fracture of pelvis (HCC)   . Hypertensive retinopathy of both eyes 08/16/2015  . Stye external 02/23/2015  . Hypertension 01/28/2014  . Kyphoscoliosis 09/16/2013  . After cataract 02/19/2013  . Pseudophakia of both eyes 01/22/2013  . Posterior capsular opacification 10/29/2012  . Cystoid macular edema 04/30/2012  . Epiretinal membrane 04/30/2012  . MVP (mitral valve prolapse) 05/24/2011  . Hypercholesterolemia 05/24/2011    COCHRAN,JENNIFER, PTA 08/22/2017, 12:28 PM  [facility] [address], [address] [place], [place], [address] Phone: [phone]   Fax:  [phone]  Name: Madison Odom MRN: [id number] Date of Birth: 05/01/1934

## 2017-08-27 ENCOUNTER — Ambulatory Visit: Payer: Medicare Other

## 2017-08-27 DIAGNOSIS — R293 Abnormal posture: Secondary | ICD-10-CM | POA: Diagnosis not present

## 2017-08-27 DIAGNOSIS — R2689 Other abnormalities of gait and mobility: Secondary | ICD-10-CM

## 2017-08-27 DIAGNOSIS — M545 Low back pain: Secondary | ICD-10-CM

## 2017-08-27 DIAGNOSIS — M6281 Muscle weakness (generalized): Secondary | ICD-10-CM

## 2017-08-27 DIAGNOSIS — G8929 Other chronic pain: Secondary | ICD-10-CM

## 2017-08-27 NOTE — Therapy (Signed)
[facility] [address] [place], [place], [address] Phone: [phone]   Fax:  [phone]  Physical Therapy Treatment  Patient Details  Name: Madison Odom MRN: [id number] Date of Birth: 1934-04-02 Referring Provider: [name], NP  Encounter Date: 08/27/2017      PT End of Session - 08/27/17 1218    Visit Number 25   Number of Visits 30   Date for PT Re-Evaluation 09/05/17   Authorization Type gcodes; kx at 15th visit   PT Start Time 1147   PT Stop Time 1228   PT Time Calculation (min) 41 min   Activity Tolerance Patient tolerated treatment well   Behavior During Therapy [facility] for tasks assessed/performed      Past Medical History:  Diagnosis Date  . GERD (gastroesophageal reflux disease)   . Hyperlipidemia   . MVP (mitral valve prolapse)   . OP (osteoporosis)   . Pelvis fracture (HCC) 09/2015   CLOSED   . Scoliosis   . Spastic colon     Past Surgical History:  Procedure Laterality Date  . ABDOMINAL HYSTERECTOMY  1965  . APPENDECTOMY    . BILATERAL OOPHORECTOMY  1981  . EYE SURGERY    . ROTATOR CUFF REPAIR Right     There were no vitals filed for this visit.      Subjective Assessment - 08/27/17 1151    Subjective Feeling better than I was duing my last therapy session.     Pertinent History LBP for several years especially standing in the kitchen. Most recent fall March 2018 picking up paper off of the kitchen floor. Felt like she couldn't walk around grocery store after the fall and then fell again onto daughter amb up curb from store. Denies vertigo. Uses the rollator at home. Lives alone. Has driver.    Currently in Pain? No/denies                         [facility] Adult PT Treatment/Exercise - 08/27/17 0001      Neuro Re-ed    Neuro Re-ed Details  Static and dynamic  Stand on black pad for various balance activites: SBA     Lumbar Exercises: Standing   Other  Standing Lumbar Exercises 4 laps around the gym working on upright posture     Knee/Hip Exercises: Aerobic   Nustep L2 x 10 min  Stopped at 8 min today due to short of breath, rested was ok     Knee/Hip Exercises: Standing   Forward Step Up 15 reps;Hand Hold: 1;Step Height: 6"     Knee/Hip Exercises: Seated   Long Arc Quad Strengthening;Both;2 sets;10 reps;Weights   Long Arc Quad Weight 3 lbs.   Clamshell with TheraBand Red  3x10   Knee/Hip Flexion 2.5# 2x10                  PT Short Term Goals - 06/25/17 1109      PT SHORT TERM GOAL #2   Title berg balance score >/= 45/56    Baseline 46/56 (06/21/17)   Status Achieved     PT SHORT TERM GOAL #3   Title Pt will report improved lumbar fatigue/LBP with static standing in kitchen x 25% to prepare a meal   Status Achieved           PT Long Term Goals - 08/27/17 1152      PT LONG TERM GOAL #3  Title Pt will report no LBP/lumbar fatigue with WB activities >/= 45 min   Baseline reports no back pain with standing, and able to stand as long as she wants   Status Achieved     PT LONG TERM GOAL #5   Title go to the grocery store and shop while holding onto a basket by herself due to Berg balance >/= 55/56   Baseline have been shopping and able to stay 45 minutes   Time 4   Period Weeks   Status On-going               Plan - 08/27/17 1155    Clinical Impression Statement Pt tolerated all exercise today without shortness of breath.  Pt denies any low back pain with standing.  Pt with chronic balance and endurance deficits and postural abnormality/LBP due to scoliosis.  Pt will continue to benefit from skilled PT for LE strength, endurance and balance exercises.     Rehab Potential Excellent   PT Frequency 2x / week   PT Duration 6 weeks   PT Treatment/Interventions ADLs/Self Care Home Management;Functional mobility training;Stair training;Gait training;DME Instruction;Moist Heat;Therapeutic  activities;Therapeutic exercise;Balance training;Neuromuscular re-education;Patient/family education;Manual techniques   PT Next Visit Plan Balance and posture, Anticipate DC on the 24th.   Consulted and Agree with Plan of Care Patient      Patient will benefit from skilled therapeutic intervention in order to improve the following deficits and impairments:  Abnormal gait, Decreased activity tolerance, Decreased balance, Decreased mobility, Decreased endurance, Decreased range of motion, Decreased strength, Difficulty walking, Impaired flexibility, Postural dysfunction, Improper body mechanics, Pain  Visit Diagnosis: Abnormal posture  Muscle weakness (generalized)  Other abnormalities of gait and mobility  Chronic midline low back pain without sciatica     Problem List Patient Active Problem List   Diagnosis Date Noted  . Epiretinal membrane, left eye 04/18/2017  . Acute blood loss anemia 09/27/2015  . Pelvic fracture (HCC) 09/21/2015  . Closed fracture of pelvis (HCC)   . Hypertensive retinopathy of both eyes 08/16/2015  . Stye external 02/23/2015  . Hypertension 01/28/2014  . Kyphoscoliosis 09/16/2013  . After cataract 02/19/2013  . Pseudophakia of both eyes 01/22/2013  . Posterior capsular opacification 10/29/2012  . Cystoid macular edema 04/30/2012  . Epiretinal membrane 04/30/2012  . MVP (mitral valve prolapse) 05/24/2011  . Hypercholesterolemia 05/24/2011   Lorrene Reid, PT 08/27/17 12:21 PM  [facility] [address], [address] [place], [place], [address] Phone: [phone]   Fax:  [phone]  Name: Madison Odom MRN: [id number] Date of Birth: 04/29/1934

## 2017-08-29 ENCOUNTER — Ambulatory Visit: Payer: Medicare Other | Admitting: Physical Therapy

## 2017-08-29 ENCOUNTER — Encounter: Payer: Self-pay | Admitting: Physical Therapy

## 2017-08-29 DIAGNOSIS — R2689 Other abnormalities of gait and mobility: Secondary | ICD-10-CM

## 2017-08-29 DIAGNOSIS — G8929 Other chronic pain: Secondary | ICD-10-CM

## 2017-08-29 DIAGNOSIS — M6281 Muscle weakness (generalized): Secondary | ICD-10-CM

## 2017-08-29 DIAGNOSIS — M545 Low back pain: Secondary | ICD-10-CM

## 2017-08-29 DIAGNOSIS — R293 Abnormal posture: Secondary | ICD-10-CM

## 2017-08-29 NOTE — Therapy (Signed)
[facility] [address], [address] [place], [place], [address] Phone: [phone]   Fax:  [phone]  Physical Therapy Treatment  Patient Details  Name: Madison Odom MRN: [id number] Date of Birth: September 06, 1934 Referring Provider: [name], NP  Encounter Date: 08/29/2017      PT End of Session - 08/29/17 1140    Visit Number 26   Number of Visits 30   Date for PT Re-Evaluation 09/05/17   Authorization Type gcodes; kx at 15th visit   PT Start Time 1138  Some exs stopped dut to shortness of breath   PT Stop Time 1210   PT Time Calculation (min) 32 min   Activity Tolerance Patient tolerated treatment well   Behavior During Therapy [facility] for tasks assessed/performed      Past Medical History:  Diagnosis Date  . GERD (gastroesophageal reflux disease)   . Hyperlipidemia   . MVP (mitral valve prolapse)   . OP (osteoporosis)   . Pelvis fracture (HCC) 09/2015   CLOSED   . Scoliosis   . Spastic colon     Past Surgical History:  Procedure Laterality Date  . ABDOMINAL HYSTERECTOMY  1965  . APPENDECTOMY    . BILATERAL OOPHORECTOMY  1981  . EYE SURGERY    . ROTATOR CUFF REPAIR Right     There were no vitals filed for this visit.      Subjective Assessment - 08/29/17 1142    Subjective i went to the grocery store the other day with success.   Currently in Pain? No/denies   Multiple Pain Sites No                         [facility] Adult PT Treatment/Exercise - 08/29/17 0001      Neuro Re-ed    Neuro Re-ed Details  Static and dynamic  Stand on black pad for various balance activites: SBA     Lumbar Exercises: Standing   Other Standing Lumbar Exercises 4 laps around the gym working on upright posture     Knee/Hip Exercises: Aerobic   Nustep L2 x 6 min  Stopped at 8 min today due to short of breath, rested was ok     Knee/Hip Exercises: Seated   Long Arc Quad Strengthening;Both;2 sets;10  reps;Weights   Long Arc Quad Weight 3 lbs.   Clamshell with TheraBand Red  3x10   Knee/Hip Flexion 2.5# 2x10                  PT Short Term Goals - 06/25/17 1109      PT SHORT TERM GOAL #2   Title berg balance score >/= 45/56    Baseline 46/56 (06/21/17)   Status Achieved     PT SHORT TERM GOAL #3   Title Pt will report improved lumbar fatigue/LBP with static standing in kitchen x 25% to prepare a meal   Status Achieved           PT Long Term Goals - 08/27/17 1152      PT LONG TERM GOAL #3   Title Pt will report no LBP/lumbar fatigue with WB activities >/= 45 min   Baseline reports no back pain with standing, and able to stand as long as she wants   Status Achieved     PT LONG TERM GOAL #5   Title go to the grocery store and shop while holding onto a basket by herself due to [employer]  balance >/= 55/56   Baseline have been shopping and able to stay 45 minutes   Time 4   Period Weeks   Status On-going               Plan - 08/29/17 1141    Clinical Impression Statement pt needed to stop again at 6 min on the Nustep dut to shortness of breath. She believes doing it first affects and next session she wants to try it last. Pt performed all stansding balance and endurance exercises well and required very little rest break for back fatigue today.    Rehab Potential Excellent   Clinical Impairments Affecting Rehab Potential osteoporosis, R RCR   PT Frequency 2x / week   PT Duration 6 weeks   PT Treatment/Interventions ADLs/Self Care Home Management;Functional mobility training;Stair training;Gait training;DME Instruction;Moist Heat;Therapeutic activities;Therapeutic exercise;Balance training;Neuromuscular re-education;Patient/family education;Manual techniques   PT Next Visit Plan Balance and posture, Anticipate DC on the 24th.   Consulted and Agree with Plan of Care Patient      Patient will benefit from skilled therapeutic intervention in order to improve the  following deficits and impairments:  Abnormal gait, Decreased activity tolerance, Decreased balance, Decreased mobility, Decreased endurance, Decreased range of motion, Decreased strength, Difficulty walking, Impaired flexibility, Postural dysfunction, Improper body mechanics, Pain  Visit Diagnosis: Abnormal posture  Muscle weakness (generalized)  Other abnormalities of gait and mobility  Chronic midline low back pain without sciatica     Problem List Patient Active Problem List   Diagnosis Date Noted  . Epiretinal membrane, left eye 04/18/2017  . Acute blood loss anemia 09/27/2015  . Pelvic fracture (HCC) 09/21/2015  . Closed fracture of pelvis (HCC)   . Hypertensive retinopathy of both eyes 08/16/2015  . Stye external 02/23/2015  . Hypertension 01/28/2014  . Kyphoscoliosis 09/16/2013  . After cataract 02/19/2013  . Pseudophakia of both eyes 01/22/2013  . Posterior capsular opacification 10/29/2012  . Cystoid macular edema 04/30/2012  . Epiretinal membrane 04/30/2012  . MVP (mitral valve prolapse) 05/24/2011  . Hypercholesterolemia 05/24/2011    Madison Odom, PTA 08/29/2017, 12:13 PM  [facility] [address], [address] [place], [place], [address] Phone: [phone]   Fax:  [phone]  Name: Madison Odom MRN: [id number] Date of Birth: 01/13/1934

## 2017-09-03 ENCOUNTER — Encounter: Payer: Self-pay | Admitting: Physical Therapy

## 2017-09-03 ENCOUNTER — Ambulatory Visit: Payer: Medicare Other | Admitting: Physical Therapy

## 2017-09-03 DIAGNOSIS — M545 Low back pain, unspecified: Secondary | ICD-10-CM

## 2017-09-03 DIAGNOSIS — R293 Abnormal posture: Secondary | ICD-10-CM | POA: Diagnosis not present

## 2017-09-03 DIAGNOSIS — M6281 Muscle weakness (generalized): Secondary | ICD-10-CM

## 2017-09-03 DIAGNOSIS — G8929 Other chronic pain: Secondary | ICD-10-CM

## 2017-09-03 DIAGNOSIS — R2689 Other abnormalities of gait and mobility: Secondary | ICD-10-CM

## 2017-09-03 NOTE — Therapy (Signed)
[facility] [address] [place], [place], [address] Phone: [phone]   Fax:  [phone]  Physical Therapy Treatment  Patient Details  Name: Madison Odom MRN: [id number] Date of Birth: Nov 23, 1933 Referring Provider: [name], NP  Encounter Date: 09/03/2017      PT End of Session - 09/03/17 1140    Visit Number 27   Number of Visits 30   Date for PT Re-Evaluation 09/05/17   Authorization Type gcodes; kx at 15th visit   PT Start Time 1139   PT Stop Time 1217   PT Time Calculation (min) 38 min   Activity Tolerance Patient tolerated treatment well   Behavior During Therapy [facility] for tasks assessed/performed      Past Medical History:  Diagnosis Date  . GERD (gastroesophageal reflux disease)   . Hyperlipidemia   . MVP (mitral valve prolapse)   . OP (osteoporosis)   . Pelvis fracture (HCC) 09/2015   CLOSED   . Scoliosis   . Spastic colon     Past Surgical History:  Procedure Laterality Date  . ABDOMINAL HYSTERECTOMY  1965  . APPENDECTOMY    . BILATERAL OOPHORECTOMY  1981  . EYE SURGERY    . ROTATOR CUFF REPAIR Right     There were no vitals filed for this visit.      Subjective Assessment - 09/03/17 1141    Subjective I had lots of energy yesterday, not so much today.    Currently in Pain? No/denies   Multiple Pain Sites No                         [facility] Adult PT Treatment/Exercise - 09/03/17 0001      Neuro Re-ed    Neuro Re-ed Details  Static and dynamic  Stand on black pad for various balance activites: SBA     Knee/Hip Exercises: Aerobic   Nustep L1 x 10 min  PTA closely monitored, no shortness of breath today     Knee/Hip Exercises: Seated   Long Arc Quad Strengthening;Both;2 sets;10 reps;Weights   Long Arc Quad Weight 3 lbs.   Ball Squeeze 25   Clamshell with TheraBand Red  3x10   Knee/Hip Flexion 2.5# 2x10                  PT Short Term Goals  - 06/25/17 1109      PT SHORT TERM GOAL #2   Title berg balance score >/= 45/56    Baseline 46/56 (06/21/17)   Status Achieved     PT SHORT TERM GOAL #3   Title Pt will report improved lumbar fatigue/LBP with static standing in kitchen x 25% to prepare a meal   Status Achieved           PT Long Term Goals - 08/27/17 1152      PT LONG TERM GOAL #3   Title Pt will report no LBP/lumbar fatigue with WB activities >/= 45 min   Baseline reports no back pain with standing, and able to stand as long as she wants   Status Achieved     PT LONG TERM GOAL #5   Title go to the grocery store and shop while holding onto a basket by herself due to Berg balance >/= 55/56   Baseline have been shopping and able to stay 45 minutes   Time 4   Period Weeks   Status On-going  Plan - 09/03/17 1141    Clinical Impression Statement Pt did significantly better doing the Nustep at the end of her session rather than the beginning. No back pain during session and pt able to take minimal sitting breaks for back fatigue.    Rehab Potential Excellent   Clinical Impairments Affecting Rehab Potential osteoporosis, R RCR   PT Frequency 2x / week   PT Duration 6 weeks   PT Treatment/Interventions ADLs/Self Care Home Management;Functional mobility training;Stair training;Gait training;DME Instruction;Moist Heat;Therapeutic activities;Therapeutic exercise;Balance training;Neuromuscular re-education;Patient/family education;Manual techniques   PT Next Visit Plan Balance and posture, Anticipate DC on the 24th.   Consulted and Agree with Plan of Care --      Patient will benefit from skilled therapeutic intervention in order to improve the following deficits and impairments:  Abnormal gait, Decreased activity tolerance, Decreased balance, Decreased mobility, Decreased endurance, Decreased range of motion, Decreased strength, Difficulty walking, Impaired flexibility, Postural dysfunction, Improper  body mechanics, Pain  Visit Diagnosis: Abnormal posture  Muscle weakness (generalized)  Other abnormalities of gait and mobility  Chronic midline low back pain without sciatica     Problem List Patient Active Problem List   Diagnosis Date Noted  . Epiretinal membrane, left eye 04/18/2017  . Acute blood loss anemia 09/27/2015  . Pelvic fracture (HCC) 09/21/2015  . Closed fracture of pelvis (HCC)   . Hypertensive retinopathy of both eyes 08/16/2015  . Stye external 02/23/2015  . Hypertension 01/28/2014  . Kyphoscoliosis 09/16/2013  . After cataract 02/19/2013  . Pseudophakia of both eyes 01/22/2013  . Posterior capsular opacification 10/29/2012  . Cystoid macular edema 04/30/2012  . Epiretinal membrane 04/30/2012  . MVP (mitral valve prolapse) 05/24/2011  . Hypercholesterolemia 05/24/2011    COCHRAN,JENNIFER, PTA 09/03/2017, 12:17 PM  [facility] [address] [place], [place], [address] Phone: [phone]   Fax:  [phone]  Name: Madison Odom MRN: [id number] Date of Birth: 1933/12/15

## 2017-09-05 ENCOUNTER — Ambulatory Visit: Payer: Medicare Other

## 2017-09-05 DIAGNOSIS — M6281 Muscle weakness (generalized): Secondary | ICD-10-CM

## 2017-09-05 DIAGNOSIS — G8929 Other chronic pain: Secondary | ICD-10-CM

## 2017-09-05 DIAGNOSIS — R293 Abnormal posture: Secondary | ICD-10-CM | POA: Diagnosis not present

## 2017-09-05 DIAGNOSIS — M545 Low back pain, unspecified: Secondary | ICD-10-CM

## 2017-09-05 DIAGNOSIS — R2689 Other abnormalities of gait and mobility: Secondary | ICD-10-CM

## 2017-09-05 NOTE — Therapy (Signed)
[facility] [address] [place], [place], [address] Phone: [phone]   Fax:  [phone]  Physical Therapy Treatment  Patient Details  Name: Madison Odom MRN: [id number] Date of Birth: 1934/06/02 Referring Provider: [name], NP  Encounter Date: 09/05/2017      PT End of Session - 09/05/17 1223    Visit Number 28   Number of Visits 30   Date for PT Re-Evaluation 10-28-2017   Authorization Type g-codes at 30, KX modifier needed   PT Start Time 1135   PT Stop Time 1227   PT Time Calculation (min) 52 min   Activity Tolerance Patient tolerated treatment well   Behavior During Therapy [facility] for tasks assessed/performed      Past Medical History:  Diagnosis Date  . GERD (gastroesophageal reflux disease)   . Hyperlipidemia   . MVP (mitral valve prolapse)   . OP (osteoporosis)   . Pelvis fracture (HCC) 09/2015   CLOSED   . Scoliosis   . Spastic colon     Past Surgical History:  Procedure Laterality Date  . ABDOMINAL HYSTERECTOMY  1965  . APPENDECTOMY    . BILATERAL OOPHORECTOMY  1981  . EYE SURGERY    . ROTATOR CUFF REPAIR Right     There were no vitals filed for this visit.          [facility] PT Assessment - 09/05/17 0001      Assessment   Medical Diagnosis LBP, balance, ataxic gait     Home Environment   Living Environment Private residence   Living Arrangements --  has chair lift and walk in tub     Prior Function   Level of Independence Independent with basic ADLs     Cognition   Overall Cognitive Status Within Functional Limits for tasks assessed     Posture/Postural Control   Posture/Postural Control Postural limitations   Posture Comments scoliosis, kyphosis, static standing discomfort at L4-5 junction     Strength   Overall Strength Deficits   Overall Strength Comments bil hip flexion 4/5, knee extension 4+/5, knee flexion 4+/5, DF 4/5     Ambulation/Gait   Ambulation/Gait  Assistance 6: Modified independent (Device/Increase time)   Assistive device Rolling walker     6 Minute Walk- Baseline   6 Minute Walk- Baseline yes  540 feet in 6 minutes     6 Minute walk- Post Test   6 Minute Walk Post Test yes     Balance   Balance Assessed Yes     Standardized Balance Assessment   Standardized Balance Assessment Berg Balance Test     Berg Balance Test   Sit to Stand Able to stand  independently using hands   Standing Unsupported Able to stand safely 2 minutes   Sitting with Back Unsupported but Feet Supported on Floor or Stool Able to sit safely and securely 2 minutes   Stand to Sit Sits safely with minimal use of hands   Transfers Able to transfer safely, definite need of hands   Standing Unsupported with Eyes Closed Able to stand 10 seconds safely   Standing Ubsupported with Feet Together Able to place feet together independently and stand for 1 minute with supervision   From Standing, Reach Forward with Outstretched Arm Can reach forward >5 cm safely (2")   From Standing Position, Pick up Object from Floor Able to pick up shoe, needs supervision   From Standing Position, Turn to Look Behind Over  each Shoulder Looks behind one side only/other side shows less weight shift   Turn 360 Degrees Needs close supervision or verbal cueing   Standing Unsupported, Alternately Place Feet on Step/Stool Able to complete >2 steps/needs minimal assist   Standing Unsupported, One Foot in Front Needs help to step but can hold 15 seconds   Standing on One Leg Able to lift leg independently and hold equal to or more than 3 seconds   Total Score 38   Berg comment: significant falls risk >80%                     [facility] Adult PT Treatment/Exercise - 09/05/17 0001      Lumbar Exercises: Standing   Other Standing Lumbar Exercises 6 minute walk test: 540 ft.      Knee/Hip Exercises: Aerobic   Nustep L1 x 10 min  PT closely monitored, no shortness of breath today      Knee/Hip Exercises: Seated   Long Arc Quad Strengthening;Both;2 sets;10 reps;Weights   Long Arc Quad Weight 3 lbs.   Clamshell with TheraBand Red  3x10   Knee/Hip Flexion 2.5# 2x10                  PT Short Term Goals - 06/25/17 1109      PT SHORT TERM GOAL #2   Title berg balance score >/= 45/56    Baseline 46/56 (06/21/17)   Status Achieved     PT SHORT TERM GOAL #3   Title Pt will report improved lumbar fatigue/LBP with static standing in kitchen x 25% to prepare a meal   Status Achieved           PT Long Term Goals - 09/05/17 1140      PT LONG TERM GOAL #1   Title Pt will demo improved bil LE strength >/= 4+/5 all major muscle groups to assist with decreased falls risk   Baseline 4+/5 knees, 4/5 hips, 4/5 DF   Time 6   Period Weeks   Status On-going   Target Date 10/17/17     PT LONG TERM GOAL #2   Title Berg balance score >/= 43/56 to reduce risk of falls   Baseline 38/56   Time 6   Period Weeks   Status On-going   Target Date 10/17/17     PT LONG TERM GOAL #3   Title Pt will report no LBP/lumbar fatigue with WB activities >/= 45 min   Baseline 1 hour   Status Achieved     PT LONG TERM GOAL #4   Title pt will demo improved lumbar/core strength to good + to assist with decreased LBP with amb in grocery store   Status Achieved     PT LONG TERM GOAL #5   Title go to the grocery store and shop while holding onto a basket by herself due to Berg balance >/= 55/56   Baseline 38/56- 80% risk of falls   Time 6   Period Weeks   Status Revised   Target Date 10/17/17     PT LONG TERM GOAL #6   Title ambulate 650-700 feet in 6 minutes to improve endurance for community function   Time 6   Period Weeks   Status New   Target Date 10/31/17               Plan - 09/05/17 1218    Clinical Impression Statement Pt is making slow and steady progress regarding strength,  endurance and balance.  Pt reports 30% improved confidence with balance at  home and in the community. Berg tested today was 38/56 indicating >80% risk of falls.  Pt ambulated 540 ft in 6 minutes. Pt with significant thoracic kyphosis and scoliosis that limits mobility and contributes to slow progression with PT.  Pt will continue to benefit from skilled PT to focus on strength, balance and endurance exercises to improve safety and independent at home and in the community.   Rehab Potential Good   Clinical Impairments Affecting Rehab Potential osteoporosis, R RCR   PT Frequency 2x / week   PT Duration 6 weeks   PT Treatment/Interventions ADLs/Self Care Home Management;Functional mobility training;Stair training;Gait training;DME Instruction;Moist Heat;Therapeutic activities;Therapeutic exercise;Balance training;Neuromuscular re-education;Patient/family education;Manual techniques   PT Next Visit Plan Address endurance, gait, strength and functional mobility.  Work on sit to stand, balance exercises   Recommended Other Services initial certification has been signed, recert sent 09/05/17   Consulted and Agree with Plan of Care Patient      Patient will benefit from skilled therapeutic intervention in order to improve the following deficits and impairments:  Abnormal gait, Decreased activity tolerance, Decreased balance, Decreased mobility, Decreased endurance, Decreased range of motion, Decreased strength, Difficulty walking, Impaired flexibility, Postural dysfunction, Improper body mechanics, Pain  Visit Diagnosis: Muscle weakness (generalized) - Plan: PT plan of care cert/re-cert  Abnormal posture - Plan: PT plan of care cert/re-cert  Other abnormalities of gait and mobility - Plan: PT plan of care cert/re-cert  Chronic midline low back pain without sciatica - Plan: PT plan of care cert/re-cert     Problem List Patient Active Problem List   Diagnosis Date Noted  . Epiretinal membrane, left eye 04/18/2017  . Acute blood loss anemia 09/27/2015  . Pelvic fracture  (HCC) 09/21/2015  . Closed fracture of pelvis (HCC)   . Hypertensive retinopathy of both eyes 08/16/2015  . Stye external 02/23/2015  . Hypertension 01/28/2014  . Kyphoscoliosis 09/16/2013  . After cataract 02/19/2013  . Pseudophakia of both eyes 01/22/2013  . Posterior capsular opacification 10/29/2012  . Cystoid macular edema 04/30/2012  . Epiretinal membrane 04/30/2012  . MVP (mitral valve prolapse) 05/24/2011  . Hypercholesterolemia 05/24/2011   Lorrene Reid, PT 09/05/17 12:28 PM  [facility] [address], [address] [place], [place], [address] Phone: [phone]   Fax:  [phone]  Name: Madison Odom MRN: [id number] Date of Birth: [birth year]

## 2017-09-10 ENCOUNTER — Ambulatory Visit: Payer: Medicare Other | Admitting: Physical Therapy

## 2017-09-10 DIAGNOSIS — M6281 Muscle weakness (generalized): Secondary | ICD-10-CM

## 2017-09-10 DIAGNOSIS — R293 Abnormal posture: Secondary | ICD-10-CM | POA: Diagnosis not present

## 2017-09-10 DIAGNOSIS — G8929 Other chronic pain: Secondary | ICD-10-CM

## 2017-09-10 DIAGNOSIS — M545 Low back pain, unspecified: Secondary | ICD-10-CM

## 2017-09-10 DIAGNOSIS — R2689 Other abnormalities of gait and mobility: Secondary | ICD-10-CM

## 2017-09-10 NOTE — Therapy (Signed)
[facility] [address] [place], [place], [address] Phone: [phone]   Fax:  [phone]  Physical Therapy Treatment  Patient Details  Name: Madison Odom MRN: [id number] Date of Birth: 1934-09-19 Referring Provider: [name], NP  Encounter Date: 09/10/2017      PT End of Session - 09/10/17 1308    Visit Number 29   Number of Visits 39   Date for PT Re-Evaluation 11-13-2017   Authorization Type g-codes at 39, KX modifier needed   PT Start Time 1233   PT Stop Time 1310   PT Time Calculation (min) 37 min   Activity Tolerance Patient tolerated treatment well   Behavior During Therapy [facility] for tasks assessed/performed      Past Medical History:  Diagnosis Date  . GERD (gastroesophageal reflux disease)   . Hyperlipidemia   . MVP (mitral valve prolapse)   . OP (osteoporosis)   . Pelvis fracture (HCC) 09/2015   CLOSED   . Scoliosis   . Spastic colon     Past Surgical History:  Procedure Laterality Date  . ABDOMINAL HYSTERECTOMY  1965  . APPENDECTOMY    . BILATERAL OOPHORECTOMY  1981  . EYE SURGERY    . ROTATOR CUFF REPAIR Right     There were no vitals filed for this visit.      Subjective Assessment - 09/10/17 1321    Subjective I feel good today . I went to [employer] before I came here today.   Pertinent History LBP for several years especially standing in the kitchen. Most recent fall March 2018 picking up paper off of the kitchen floor. Felt like she couldn't walk around grocery store after the fall and then fell again onto daughter amb up curb from store. Denies vertigo. Uses the rollator at home. Lives alone. Has driver.    Limitations Sitting;Lifting;Standing;Walking   Diagnostic tests severe scoliosis; diagnostic testing March 2018   Patient Stated Goals to be able to walk in the house without any help and use the cane outside   Currently in Pain? No/denies                          [facility] Adult PT Treatment/Exercise - 09/10/17 0001      Lumbar Exercises: Standing   Other Standing Lumbar Exercises 6 minute walk test: 610 ft.      Knee/Hip Exercises: Aerobic   Nustep L1 x 10 min  PT closely monitored, no shortness of breath today     Knee/Hip Exercises: Seated   Long Arc Quad Strengthening;Both;2 sets;10 reps;Weights   Long Arc Quad Weight 3 lbs.   Clamshell with TheraBand Green  3x10   Knee/Hip Flexion 3# 2x10   Hamstring Curl Strengthening;2 sets;10 reps  green band   Sit to Sand 2 sets;5 reps  5x 29 sec                  PT Short Term Goals - 06/25/17 1109      PT SHORT TERM GOAL #2   Title berg balance score >/= 45/56    Baseline 46/56 (06/21/17)   Status Achieved     PT SHORT TERM GOAL #3   Title Pt will report improved lumbar fatigue/LBP with static standing in kitchen x 25% to prepare a meal   Status Achieved           PT Long Term Goals - 09/10/17 1244  PT LONG TERM GOAL #1   Title Pt will demo improved bil LE strength >/= 4+/5 all major muscle groups to assist with decreased falls risk   Time 6   Period Weeks   Status On-going     PT LONG TERM GOAL #2   Title Berg balance score >/= 43/56 to reduce risk of falls   Time 6   Period Weeks   Status On-going     PT LONG TERM GOAL #3   Title Pt will report no LBP/lumbar fatigue with WB activities >/= 45 min   Time 6   Period Weeks   Status Achieved     PT LONG TERM GOAL #4   Title pt will demo improved lumbar/core strength to good + to assist with decreased LBP with amb in grocery store   Time 6   Period Weeks   Status Achieved     PT LONG TERM GOAL #5   Title go to the grocery store and shop while holding onto a basket by herself due to [employer]erg balance >/= 55/56   Baseline 38/56- 80% risk of falls   Time 6   Period Weeks   Status On-going     PT LONG TERM GOAL #6   Title ambulate 650-700 feet in 6 minutes to improve  endurance for community function   Baseline 610   Time 6   Period Weeks   Status On-going               Plan - 09/10/17 1309    Clinical Impression Statement Patient is making slow and steady progress.  She continues to have difficulty with balance. She is not able to walk around store pushing the cart and needs assistance from her daughter.  She was able to slightly increase the resistance and went a little farther during 6 min walk test.  Pt continues to benefit from skilled PT for increased strength and endurance with functional walking tasks in the community.   Clinical Impairments Affecting Rehab Potential osteoporosis, R RCR   PT Treatment/Interventions ADLs/Self Care Home Management;Functional mobility training;Stair training;Gait training;DME Instruction;Moist Heat;Therapeutic activities;Therapeutic exercise;Balance training;Neuromuscular re-education;Patient/family education;Manual techniques   PT Next Visit Plan Address endurance, gait, strength and functional mobility.  Work on sit to stand, Cabin crewbalance exercises   Consulted and Agree with Plan of Care Patient      Patient will benefit from skilled therapeutic intervention in order to improve the following deficits and impairments:  Abnormal gait, Decreased activity tolerance, Decreased balance, Decreased mobility, Decreased endurance, Decreased range of motion, Decreased strength, Difficulty walking, Impaired flexibility, Postural dysfunction, Improper body mechanics, Pain  Visit Diagnosis: Muscle weakness (generalized)  Abnormal posture  Other abnormalities of gait and mobility  Chronic midline low back pain without sciatica       G-Codes - 09/10/17 1307    Functional Assessment Tool Used (Outpatient Only) Berg 38/56, 6 min walk 610 feet   Functional Limitation Mobility: Walking and moving around   Mobility: Walking and Moving Around Current Status [phone]([id number]) At least 20 percent but less than 40 percent impaired,  limited or restricted   Mobility: Walking and Moving Around Goal Status [phone]([id number]) At least 1 percent but less than 20 percent impaired, limited or restricted      Problem List Patient Active Problem List   Diagnosis Date Noted  . Epiretinal membrane, left eye 04/18/2017  . Acute blood loss anemia 09/27/2015  . Pelvic fracture (HCC) 09/21/2015  . Closed fracture of pelvis (HCC)   .  Hypertensive retinopathy of both eyes 08/16/2015  . Stye external 02/23/2015  . Hypertension 01/28/2014  . Kyphoscoliosis 09/16/2013  . After cataract 02/19/2013  . Pseudophakia of both eyes 01/22/2013  . Posterior capsular opacification 10/29/2012  . Cystoid macular edema 04/30/2012  . Epiretinal membrane 04/30/2012  . MVP (mitral valve prolapse) 05/24/2011  . Hypercholesterolemia 05/24/2011    Vincente Poli, PT 09/10/2017, 1:22 PM  [facility] [address] [place], [place], [address] Phone: [phone]   Fax:  [phone]  Name: Madison Odom MRN: [id number] Date of Birth: December 28, 1933

## 2017-09-12 ENCOUNTER — Encounter: Payer: Self-pay | Admitting: Physical Therapy

## 2017-09-12 ENCOUNTER — Ambulatory Visit: Payer: Medicare Other | Admitting: Physical Therapy

## 2017-09-12 DIAGNOSIS — R293 Abnormal posture: Secondary | ICD-10-CM | POA: Diagnosis not present

## 2017-09-12 DIAGNOSIS — G8929 Other chronic pain: Secondary | ICD-10-CM

## 2017-09-12 DIAGNOSIS — M6281 Muscle weakness (generalized): Secondary | ICD-10-CM

## 2017-09-12 DIAGNOSIS — M545 Low back pain: Secondary | ICD-10-CM

## 2017-09-12 DIAGNOSIS — R2689 Other abnormalities of gait and mobility: Secondary | ICD-10-CM

## 2017-09-12 NOTE — Therapy (Signed)
[facility] [address], [address] [place], [place], [address] Phone: [phone]   Fax:  [phone]  Physical Therapy Treatment  Patient Details  Name: Madison Odom MRN: [id number] Date of Birth: [birth year] Referring Provider: Shirline Freesory Nafziger, NP  Encounter Date: 09/12/2017      PT End of Session - 09/12/17 1200    Visit Number 30   Number of Visits 39   Date for PT Re-Evaluation 10/17/17   Authorization Type g-codes at 39, KX modifier needed   PT Start Time 1143   PT Stop Time 1225   PT Time Calculation (min) 42 min   Activity Tolerance Patient tolerated treatment well   Behavior During Therapy [facility] for tasks assessed/performed      Past Medical History:  Diagnosis Date  . GERD (gastroesophageal reflux disease)   . Hyperlipidemia   . MVP (mitral valve prolapse)   . OP (osteoporosis)   . Pelvis fracture (HCC) 09/2015   CLOSED   . Scoliosis   . Spastic colon     Past Surgical History:  Procedure Laterality Date  . ABDOMINAL HYSTERECTOMY  1965  . APPENDECTOMY    . BILATERAL OOPHORECTOMY  1981  . EYE SURGERY    . ROTATOR CUFF REPAIR Right     There were no vitals filed for this visit.      Subjective Assessment - 09/12/17 1159    Subjective I am doing good today.  I am still not using the basket at the grocery store because I am nervous about falling.   Pertinent History LBP for several years especially standing in the kitchen. Most recent fall March 2018 picking up paper off of the kitchen floor. Felt like she couldn't walk around grocery store after the fall and then fell again onto daughter amb up curb from store. Denies vertigo. Uses the rollator at home. Lives alone. Has driver.    Limitations Sitting;Lifting;Standing;Walking   Patient Stated Goals to be able to walk in the house without any help and use the cane outside   Currently in Pain? No/denies                         [facility]  Adult PT Treatment/Exercise - 09/12/17 0001      Knee/Hip Exercises: Aerobic   Nustep L1 x 10 min  PT closely monitored, no shortness of breath today     Knee/Hip Exercises: Standing   Heel Raises 2 sets;10 reps;Both  one UE support   Knee Flexion Strengthening;Right;Left;2 sets;10 reps  1.5#   Hip Flexion Stengthening;Right;Left;20 reps;Knee bent  1.5#   Hip Abduction Stengthening;Right;Left;2 sets;10 reps;Knee straight  with and without UE - step with    Forward Step Up 15 reps;Hand Hold: 1;Step Height: 6"     Knee/Hip Exercises: Seated   Long Arc Quad Strengthening;Both;2 sets;10 reps;Weights   Long Arc Quad Weight 4 lbs.   Clamshell with TheraBand --  3x10   Knee/Hip Flexion 3# 2x10   Sit to Sand 2 sets;10 reps  sitting and standing on foam mat                  PT Short Term Goals - 06/25/17 1109      PT SHORT TERM GOAL #2   Title berg balance score >/= 45/56    Baseline 46/56 (06/21/17)   Status Achieved     PT SHORT TERM GOAL #3   Title Pt will report improved lumbar  fatigue/LBP with static standing in kitchen x 25% to prepare a meal   Status Achieved           PT Long Term Goals - 09/10/17 1244      PT LONG TERM GOAL #1   Title Pt will demo improved bil LE strength >/= 4+/5 all major muscle groups to assist with decreased falls risk   Time 6   Period Weeks   Status On-going     PT LONG TERM GOAL #2   Title Berg balance score >/= 43/56 to reduce risk of falls   Time 6   Period Weeks   Status On-going     PT LONG TERM GOAL #3   Title Pt will report no LBP/lumbar fatigue with WB activities >/= 45 min   Time 6   Period Weeks   Status Achieved     PT LONG TERM GOAL #4   Title pt will demo improved lumbar/core strength to good + to assist with decreased LBP with amb in grocery store   Time 6   Period Weeks   Status Achieved     PT LONG TERM GOAL #5   Title go to the grocery store and shop while holding onto a basket by herself due to  [employer] >/= 55/56   Baseline 38/56- 80% risk of falls   Time 6   Period Weeks   Status On-going     PT LONG TERM GOAL #6   Title ambulate 650-700 feet in 6 minutes to improve endurance for community function   Baseline 610   Time 6   Period Weeks   Status On-going               Plan - 09/12/17 1201    Clinical Impression Statement Patient did well with addition of standing exercises.  She was able to do some stepping sideways andbackwards with no UE support but needed cues to keep distance from the railing and needs close supervision.  Pt had more difficulty on left left with single leg exercises and step ups.  She was able to stand from elevated surface while standing on foam mat.  Pt continues to benefit from skilled PT to work on improved strength, gait and balance to reutrn to functional activities.   Rehab Potential Good   Clinical Impairments Affecting Rehab Potential osteoporosis, R RCR   PT Treatment/Interventions ADLs/Self Care Home Management;Functional mobility training;Stair training;Gait training;DME Instruction;Moist Heat;Therapeutic activities;Therapeutic exercise;Balance training;Neuromuscular re-education;Patient/family education;Manual techniques   PT Next Visit Plan Address endurance, gait, strength and functional mobility.  Work on sit to stand, Cabin crew and Agree with Plan of Care Patient      Patient will benefit from skilled therapeutic intervention in order to improve the following deficits and impairments:  Abnormal gait, Decreased activity tolerance, Decreased balance, Decreased mobility, Decreased endurance, Decreased range of motion, Decreased strength, Difficulty walking, Impaired flexibility, Postural dysfunction, Improper body mechanics, Pain  Visit Diagnosis: Muscle weakness (generalized)  Abnormal posture  Other abnormalities of gait and mobility  Chronic midline low back pain without sciatica     Problem  List Patient Active Problem List   Diagnosis Date Noted  . Epiretinal membrane, left eye 04/18/2017  . Acute blood loss anemia 09/27/2015  . Pelvic fracture (HCC) 09/21/2015  . Closed fracture of pelvis (HCC)   . Hypertensive retinopathy of both eyes 08/16/2015  . Stye external 02/23/2015  . Hypertension 01/28/2014  . Kyphoscoliosis 09/16/2013  . After cataract  02/19/2013  . Pseudophakia of both eyes 01/22/2013  . Posterior capsular opacification 10/29/2012  . Cystoid macular edema 04/30/2012  . Epiretinal membrane 04/30/2012  . MVP (mitral valve prolapse) 05/24/2011  . Hypercholesterolemia 05/24/2011    Vincente Poli, PT 09/12/2017, 12:29 PM  [facility] [address], [address] [place], [place], [address] Phone: [phone]   Fax:  [phone]  Name: Madison Odom MRN: [id number] Date of Birth: 1933/12/11

## 2017-09-17 ENCOUNTER — Ambulatory Visit: Payer: Medicare Other | Attending: Adult Health | Admitting: Physical Therapy

## 2017-09-17 ENCOUNTER — Encounter: Payer: Self-pay | Admitting: Physical Therapy

## 2017-09-17 DIAGNOSIS — R2689 Other abnormalities of gait and mobility: Secondary | ICD-10-CM | POA: Insufficient documentation

## 2017-09-17 DIAGNOSIS — M545 Low back pain, unspecified: Secondary | ICD-10-CM

## 2017-09-17 DIAGNOSIS — G8929 Other chronic pain: Secondary | ICD-10-CM | POA: Diagnosis present

## 2017-09-17 DIAGNOSIS — R293 Abnormal posture: Secondary | ICD-10-CM | POA: Diagnosis present

## 2017-09-17 DIAGNOSIS — M6281 Muscle weakness (generalized): Secondary | ICD-10-CM | POA: Insufficient documentation

## 2017-09-17 NOTE — Therapy (Signed)
[facility] [address] [place], [place], [address] Phone: [phone]   Fax:  [phone]  Physical Therapy Treatment  Patient Details  Name: Madison Odom MRN: [id number] Date of Birth: [birth year] Referring Provider: [name], NP   Encounter Date: 09/17/2017  PT End of Session - 09/17/17 1057    Visit Number  31    Number of Visits  39    Date for PT Re-Evaluation  10/17/17    Authorization Type  g-codes at 39, KX modifier needed    PT Start Time  1057    PT Stop Time  1137    PT Time Calculation (min)  40 min    Activity Tolerance  Patient tolerated treatment well    Behavior During Therapy  [facility] for tasks assessed/performed       Past Medical History:  Diagnosis Date  . GERD (gastroesophageal reflux disease)   . Hyperlipidemia   . MVP (mitral valve prolapse)   . OP (osteoporosis)   . Pelvis fracture (HCC) 09/2015   CLOSED   . Scoliosis   . Spastic colon     Past Surgical History:  Procedure Laterality Date  . ABDOMINAL HYSTERECTOMY  1965  . APPENDECTOMY    . BILATERAL OOPHORECTOMY  1981  . EYE SURGERY    . ROTATOR CUFF REPAIR Right     There were no vitals filed for this visit.  Subjective Assessment - 09/17/17 1101    Subjective  No new complaints.    Pertinent History  LBP for several years especially standing in the kitchen. Most recent fall March 2018 picking up paper off of the kitchen floor. Felt like she couldn't walk around grocery store after the fall and then fell again onto daughter amb up curb from store. Denies vertigo. Uses the rollator at home. Lives alone. Has driver.     Limitations  Sitting;Lifting;Standing;Walking    Currently in Pain?  No/denies    Multiple Pain Sites  No                      [facility] Adult PT Treatment/Exercise - 09/17/17 0001      Neuro Re-ed    Neuro Re-ed Details   Standing dynamic balance activites on the black pad. Ligth UE used  for balance.       Lumbar Exercises: Standing   Other Standing Lumbar Exercises  4 1/2 laps of ortho gym with focus on posture      Knee/Hip Exercises: Aerobic   Nustep  L1 x 10 min PT closely monitored, no shortness of breath today   PT closely monitored, no shortness of breath today     Knee/Hip Exercises: Standing   Hip Abduction  Both;2 sets;10 reps;Knee straight Standing on back pad    Standing on back pad    Other Standing Knee Exercises  Single leg stance RT or Lt, then step up with other leg 2x10,       Knee/Hip Exercises: Seated   Long Arc Quad  Strengthening;Both;2 sets;10 reps;Weights    Long Arc Quad Weight  4 lbs.    Clamshell with TheraBand  Green 3x10   3x10              PT Short Term Goals - 06/25/17 1109      PT SHORT TERM GOAL #2   Title  berg balance score >/= 45/56     Baseline  46/56 (06/21/17)    Status  Achieved      PT SHORT TERM GOAL #3   Title  Pt will report improved lumbar fatigue/LBP with static standing in kitchen x 25% to prepare a meal    Status  Achieved        PT Long Term Goals - 09/10/17 1244      PT LONG TERM GOAL #1   Title  Pt will demo improved bil LE strength >/= 4+/5 all major muscle groups to assist with decreased falls risk    Time  6    Period  Weeks    Status  On-going      PT LONG TERM GOAL #2   Title  Berg balance score >/= 43/56 to reduce risk of falls    Time  6    Period  Weeks    Status  On-going      PT LONG TERM GOAL #3   Title  Pt will report no LBP/lumbar fatigue with WB activities >/= 45 min    Time  6    Period  Weeks    Status  Achieved      PT LONG TERM GOAL #4   Title  pt will demo improved lumbar/core strength to good + to assist with decreased LBP with amb in grocery store    Time  6    Period  Weeks    Status  Achieved      PT LONG TERM GOAL #5   Title  go to the grocery store and shop while holding onto a basket by herself due to [employer] >/= 55/56    Baseline  38/56- 80% risk of  falls    Time  6    Period  Weeks    Status  On-going      PT LONG TERM GOAL #6   Title  ambulate 650-700 feet in 6 minutes to improve endurance for community function    Baseline  610    Time  6    Period  Weeks    Status  On-going            Plan - 09/17/17 1127    Clinical Impression Statement  Pt performs balance exercise some without UE support, some with light support. Standing on foam requires more UE asst than standing on the level floor. Pt was able to walk [employer] last week with less fatigue.     Rehab Potential  Good    Clinical Impairments Affecting Rehab Potential  osteoporosis, R RCR    PT Frequency  2x / week    PT Duration  6 weeks    PT Treatment/Interventions  ADLs/Self Care Home Management;Functional mobility training;Stair training;Gait training;DME Instruction;Moist Heat;Therapeutic activities;Therapeutic exercise;Balance training;Neuromuscular re-education;Patient/family education;Manual techniques    PT Next Visit Plan  Address endurance, gait, strength and functional mobility.  Work on sit to stand, Counsellor and Agree with Plan of Care  Patient       Patient will benefit from skilled therapeutic intervention in order to improve the following deficits and impairments:  Abnormal gait, Decreased activity tolerance, Decreased balance, Decreased mobility, Decreased endurance, Decreased range of motion, Decreased strength, Difficulty walking, Impaired flexibility, Postural dysfunction, Improper body mechanics, Pain  Visit Diagnosis: Muscle weakness (generalized)  Abnormal posture  Other abnormalities of gait and mobility  Chronic midline low back pain without sciatica     Problem List Patient Active Problem List   Diagnosis Date Noted  . Epiretinal membrane, left eye  04/18/2017  . Acute blood loss anemia 09/27/2015  . Pelvic fracture (HCC) 09/21/2015  . Closed fracture of pelvis (HCC)   . Hypertensive retinopathy of both eyes  08/16/2015  . Stye external 02/23/2015  . Hypertension 01/28/2014  . Kyphoscoliosis 09/16/2013  . After cataract 02/19/2013  . Pseudophakia of both eyes 01/22/2013  . Posterior capsular opacification 10/29/2012  . Cystoid macular edema 04/30/2012  . Epiretinal membrane 04/30/2012  . MVP (mitral valve prolapse) 05/24/2011  . Hypercholesterolemia 05/24/2011    Lynn Recendiz, PTA 09/17/2017, 11:30 AM  [facility] [address], [address] [place], [place], [address] Phone: [phone]   Fax:  [phone]  Name: Madison Odom MRN: [id number] Date of Birth: 1934-10-13

## 2017-09-19 ENCOUNTER — Ambulatory Visit: Payer: Medicare Other | Admitting: Physical Therapy

## 2017-09-19 ENCOUNTER — Encounter: Payer: Self-pay | Admitting: Physical Therapy

## 2017-09-19 DIAGNOSIS — M545 Low back pain: Secondary | ICD-10-CM

## 2017-09-19 DIAGNOSIS — M6281 Muscle weakness (generalized): Secondary | ICD-10-CM | POA: Diagnosis not present

## 2017-09-19 DIAGNOSIS — R293 Abnormal posture: Secondary | ICD-10-CM

## 2017-09-19 DIAGNOSIS — R2689 Other abnormalities of gait and mobility: Secondary | ICD-10-CM

## 2017-09-19 DIAGNOSIS — G8929 Other chronic pain: Secondary | ICD-10-CM

## 2017-09-19 NOTE — Therapy (Signed)
[facility] [address], [address] [place], [place], [address] Phone: [phone]   Fax:  [phone]  Physical Therapy Treatment  Patient Details  Name: Madison Odom MRN: [id number] Date of Birth: 1934/03/04 Referring Provider: Shirline Freesory Nafziger, NP   Encounter Date: 09/19/2017  PT End of Session - 09/19/17 1135    Visit Number  32    Number of Visits  39    Date for PT Re-Evaluation  10/17/17    Authorization Type  g-codes at 39, KX modifier needed    PT Start Time  1134    PT Stop Time  1212    PT Time Calculation (min)  38 min    Activity Tolerance  Patient tolerated treatment well    Behavior During Therapy  [facility] for tasks assessed/performed       Past Medical History:  Diagnosis Date  . GERD (gastroesophageal reflux disease)   . Hyperlipidemia   . MVP (mitral valve prolapse)   . OP (osteoporosis)   . Pelvis fracture (HCC) 09/2015   CLOSED   . Scoliosis   . Spastic colon     Past Surgical History:  Procedure Laterality Date  . ABDOMINAL HYSTERECTOMY  1965  . APPENDECTOMY    . BILATERAL OOPHORECTOMY  1981  . EYE SURGERY    . ROTATOR CUFF REPAIR Right     There were no vitals filed for this visit.  Subjective Assessment - 09/19/17 1137    Subjective  No new complaints.    Currently in Pain?  No/denies    Multiple Pain Sites  No                      [facility] Adult PT Treatment/Exercise - 09/19/17 0001      Neuro Re-ed    Neuro Re-ed Details   Standing dynamic balance activites on the black pad.No UE support to ligth UE used for balance.       Lumbar Exercises: Standing   Other Standing Lumbar Exercises  2 laps indoors, 200 ft + outdoors on the sidewalks.       Knee/Hip Exercises: Aerobic   Nustep  L1 x 10 min PT closely monitored, no shortness of breath today   PT closely monitored, no shortness of breath today     Knee/Hip Exercises: Standing   Knee Flexion  Strengthening;Both;20 reps  on pad for balance   on pad for balance   Hip Abduction  Both;2 sets;10 reps;Knee straight Standing on back pad    Standing on back pad    Other Standing Knee Exercises  Single leg stance RT or Lt, then step up with other leg 2x10,  on pad   on pad     Knee/Hip Exercises: Seated   Long Arc Quad  Strengthening;Both;2 sets;10 reps;Weights    Long Arc Quad Weight  4 lbs.    Clamshell with TheraBand  Green 3x10   3x10              PT Short Term Goals - 06/25/17 1109      PT SHORT TERM GOAL #2   Title  berg balance score >/= 45/56     Baseline  46/56 (06/21/17)    Status  Achieved      PT SHORT TERM GOAL #3   Title  Pt will report improved lumbar fatigue/LBP with static standing in kitchen x 25% to prepare a meal    Status  Achieved  PT Long Term Goals - 09/10/17 1244      PT LONG TERM GOAL #1   Title  Pt will demo improved bil LE strength >/= 4+/5 all major muscle groups to assist with decreased falls risk    Time  6    Period  Weeks    Status  On-going      PT LONG TERM GOAL #2   Title  Berg balance score >/= 43/56 to reduce risk of falls    Time  6    Period  Weeks    Status  On-going      PT LONG TERM GOAL #3   Title  Pt will report no LBP/lumbar fatigue with WB activities >/= 45 min    Time  6    Period  Weeks    Status  Achieved      PT LONG TERM GOAL #4   Title  pt will demo improved lumbar/core strength to good + to assist with decreased LBP with amb in grocery store    Time  6    Period  Weeks    Status  Achieved      PT LONG TERM GOAL #5   Title  go to the grocery store and shop while holding onto a basket by herself due to [employer] balance >/= 55/56    Baseline  38/56- 80% risk of falls    Time  6    Period  Weeks    Status  On-going      PT LONG TERM GOAL #6   Title  ambulate 650-700 feet in 6 minutes to improve endurance for community function    Baseline  610    Time  6    Period  Weeks    Status  On-going            Plan -  09/19/17 1204    Clinical Impression Statement  Pt continues to do well. Some small episodes of stubbing her LT toe into the stair or black pad. She stumbled a little but not bad and no near fall. Progressing to more time NOT using her UE during the balance exs,.     Rehab Potential  Good    Clinical Impairments Affecting Rehab Potential  osteoporosis, R RCR    PT Frequency  2x / week    PT Duration  6 weeks    PT Treatment/Interventions  ADLs/Self Care Home Management;Functional mobility training;Stair training;Gait training;DME Instruction;Moist Heat;Therapeutic activities;Therapeutic exercise;Balance training;Neuromuscular re-education;Patient/family education;Manual techniques    PT Next Visit Plan  Address endurance, gait, strength and functional mobility.  Work on sit to stand, Counsellorbalance exercises    Consulted and Agree with Plan of Care  Patient       Patient will benefit from skilled therapeutic intervention in order to improve the following deficits and impairments:  Abnormal gait, Decreased activity tolerance, Decreased balance, Decreased mobility, Decreased endurance, Decreased range of motion, Decreased strength, Difficulty walking, Impaired flexibility, Postural dysfunction, Improper body mechanics, Pain  Visit Diagnosis: Muscle weakness (generalized)  Abnormal posture  Other abnormalities of gait and mobility  Chronic midline low back pain without sciatica     Problem List Patient Active Problem List   Diagnosis Date Noted  . Epiretinal membrane, left eye 04/18/2017  . Acute blood loss anemia 09/27/2015  . Pelvic fracture (HCC) 09/21/2015  . Closed fracture of pelvis (HCC)   . Hypertensive retinopathy of both eyes 08/16/2015  . Stye external 02/23/2015  . Hypertension 01/28/2014  .  Kyphoscoliosis 09/16/2013  . After cataract 02/19/2013  . Pseudophakia of both eyes 01/22/2013  . Posterior capsular opacification 10/29/2012  . Cystoid macular edema 04/30/2012  .  Epiretinal membrane 04/30/2012  . MVP (mitral valve prolapse) 05/24/2011  . Hypercholesterolemia 05/24/2011    Madison Odom, PTA 09/19/2017, 12:08 PM  [facility] [address] [place], [place], [address] Phone: [phone]   Fax:  [phone]  Name: Madison Odom MRN: [id number] Date of Birth: Jul 02, 1934

## 2017-09-24 ENCOUNTER — Ambulatory Visit: Payer: Medicare Other | Admitting: Physical Therapy

## 2017-09-24 ENCOUNTER — Encounter: Payer: Self-pay | Admitting: Physical Therapy

## 2017-09-24 DIAGNOSIS — R2689 Other abnormalities of gait and mobility: Secondary | ICD-10-CM

## 2017-09-24 DIAGNOSIS — M545 Low back pain: Secondary | ICD-10-CM

## 2017-09-24 DIAGNOSIS — G8929 Other chronic pain: Secondary | ICD-10-CM

## 2017-09-24 DIAGNOSIS — M6281 Muscle weakness (generalized): Secondary | ICD-10-CM | POA: Diagnosis not present

## 2017-09-24 DIAGNOSIS — R293 Abnormal posture: Secondary | ICD-10-CM

## 2017-09-24 NOTE — Therapy (Signed)
[facility] [address], [place], [place], [address] Phone: [phone]   Fax:  [phone]  Physical Therapy Treatment  Patient Details  Name: Madison Odom MRN: [id number] Date of Birth: [birth year] Referring Provider: Dorothyann Peng, NP   Encounter Date: 09/24/2017  PT End of Session - 09/24/17 1142    Visit Number  33    Number of Visits  39    Date for PT Re-Evaluation  10/17/17    Authorization Type  g-codes at 71, KX modifier needed    PT Start Time  1142    PT Stop Time  1221    PT Time Calculation (min)  39 min    Activity Tolerance  Patient tolerated treatment well    Behavior During Therapy  [facility] for tasks assessed/performed       Past Medical History:  Diagnosis Date  . GERD (gastroesophageal reflux disease)   . Hyperlipidemia   . MVP (mitral valve prolapse)   . OP (osteoporosis)   . Pelvis fracture ([place]) 09/2015   CLOSED   . Scoliosis   . Spastic colon     Past Surgical History:  Procedure Laterality Date  . ABDOMINAL HYSTERECTOMY  1965  . APPENDECTOMY    . BILATERAL OOPHORECTOMY  1981  . EYE SURGERY    . ROTATOR CUFF REPAIR Right     There were no vitals filed for this visit.  Subjective Assessment - 09/24/17 1143    Subjective  No new complaints.    Pertinent History  LBP for several years especially standing in the kitchen. Most recent fall March 2018 picking up paper off of the kitchen floor. Felt like she couldn't walk around grocery store after the fall and then fell again onto daughter amb up curb from store. Denies vertigo. Uses the rollator at home. Lives alone. Has driver.     Limitations  Sitting;Lifting;Standing;Walking    Currently in Pain?  No/denies         [facility] PT Assessment - 09/24/17 0001      Berg Balance Test   Sit to Stand  Able to stand without using hands and stabilize independently    Standing Unsupported  Able to stand safely 2 minutes    Sitting with  Back Unsupported but Feet Supported on Floor or Stool  Able to sit safely and securely 2 minutes    Stand to Sit  Sits safely with minimal use of hands    Transfers  Able to transfer safely, definite need of hands    Standing Unsupported with Eyes Closed  Able to stand 10 seconds safely    Standing Ubsupported with Feet Together  Able to place feet together independently and stand 1 minute safely    From Standing, Reach Forward with Outstretched Arm  Can reach forward >5 cm safely (2")    From Standing Position, Pick up Object from Floor  Able to pick up shoe, needs supervision    From Standing Position, Turn to Look Behind Over each Shoulder  Looks behind one side only/other side shows less weight shift    Turn 360 Degrees  Needs close supervision or verbal cueing    Standing Unsupported, Alternately Place Feet on Step/Stool  Able to stand independently and complete 8 steps >20 seconds    Standing Unsupported, One Foot in Front  Able to take small step independently and hold 30 seconds    Standing on One Leg  Able to lift leg independently and  hold equal to or more than 3 seconds LTLE    Total Score  43                  [facility] Adult PT Treatment/Exercise - 09/24/17 0001      Neuro Re-ed    Neuro Re-ed Details   Standing dynamic balance activites on the black pad.No UE support to ligth UE used for balance.       Knee/Hip Exercises: Aerobic   Nustep  L1 x 10 min PT closely monitored, no shortness of breath today      Knee/Hip Exercises: Seated   Long Arc Quad  Strengthening;Both;2 sets;20 reps;Weights    Long Arc Quad Weight  4 lbs.    Clamshell with TheraBand  Green 3x10               PT Short Term Goals - 06/25/17 1109      PT SHORT TERM GOAL #2   Title  berg balance score >/= 45/56     Baseline  46/56 (06/21/17)    Status  Achieved      PT SHORT TERM GOAL #3   Title  Pt will report improved lumbar fatigue/LBP with static standing in kitchen x 25% to prepare a  meal    Status  Achieved        PT Long Term Goals - 09/24/17 1210      PT LONG TERM GOAL #1   Title  Pt will demo improved bil LE strength >/= 4+/5 all major muscle groups to assist with decreased falls risk    Time  6    Period  Weeks    Status  On-going      PT LONG TERM GOAL #2   Title  Berg balance score >/= 43/56 to reduce risk of falls    Baseline  38/56    Time  6    Period  Weeks    Status  Achieved 43/56      PT LONG TERM GOAL #5   Title  go to the grocery store and shop while holding onto a basket by herself due to [employer] >/= 55/56    Baseline  38/56- 80% risk of falls    Time  6    Period  Weeks    Status  Partially Met Pt can hold onto the basket independently while shopping but BERG is 43 pts.            Plan - 09/24/17 1143    Clinical Impression Statement  Pt increased her BERG score to a 43, increasing her score by 5 points. Turning independently is still difficult for pt, feels unsafe. Pt met LTG for BERG initially, but there is another BERG score of 55/56 conjoined with independently holding on to her basket.  Pt still feels like her walker full time is the best option for her.     Rehab Potential  Good    Clinical Impairments Affecting Rehab Potential  osteoporosis, R RCR    PT Frequency  2x / week    PT Duration  6 weeks    PT Treatment/Interventions  ADLs/Self Care Home Management;Functional mobility training;Stair training;Gait training;DME Instruction;Moist Heat;Therapeutic activities;Therapeutic exercise;Balance training;Neuromuscular re-education;Patient/family education;Manual techniques    PT Next Visit Plan  Address endurance, gait, strength and functional mobility.  Work on sit to stand, Glass blower/designer and Agree with Plan of Care  Patient       Patient will benefit  from skilled therapeutic intervention in order to improve the following deficits and impairments:  Abnormal gait, Decreased activity tolerance, Decreased  balance, Decreased mobility, Decreased endurance, Decreased range of motion, Decreased strength, Difficulty walking, Impaired flexibility, Postural dysfunction, Improper body mechanics, Pain  Visit Diagnosis: Muscle weakness (generalized)  Abnormal posture  Other abnormalities of gait and mobility  Chronic midline low back pain without sciatica     Problem List Patient Active Problem List   Diagnosis Date Noted  . Epiretinal membrane, left eye 04/18/2017  . Acute blood loss anemia 09/27/2015  . Pelvic fracture ([place]) 09/21/2015  . Closed fracture of pelvis ([place])   . Hypertensive retinopathy of both eyes 08/16/2015  . Stye external 02/23/2015  . Hypertension 01/28/2014  . Kyphoscoliosis 09/16/2013  . After cataract 02/19/2013  . Pseudophakia of both eyes 01/22/2013  . Posterior capsular opacification 10/29/2012  . Cystoid macular edema 04/30/2012  . Epiretinal membrane 04/30/2012  . MVP (mitral valve prolapse) 05/24/2011  . Hypercholesterolemia 05/24/2011    Jake Goodson, PTA 09/24/2017, 12:26 PM  [facility] [address], [place], [place], [address] Phone: [phone]   Fax:  [phone]  Name: Madison Odom MRN: [id number] Date of Birth: 12/01/1933

## 2017-09-26 ENCOUNTER — Ambulatory Visit: Payer: Medicare Other | Admitting: Physical Therapy

## 2017-09-26 DIAGNOSIS — R293 Abnormal posture: Secondary | ICD-10-CM

## 2017-09-26 DIAGNOSIS — M6281 Muscle weakness (generalized): Secondary | ICD-10-CM

## 2017-09-26 DIAGNOSIS — G8929 Other chronic pain: Secondary | ICD-10-CM

## 2017-09-26 DIAGNOSIS — M545 Low back pain: Secondary | ICD-10-CM

## 2017-09-26 DIAGNOSIS — R2689 Other abnormalities of gait and mobility: Secondary | ICD-10-CM

## 2017-09-26 NOTE — Therapy (Signed)
[facility] [address], [place], [place], [address] Phone: [phone]   Fax:  [phone]  Physical Therapy Treatment  Patient Details  Name: Madison Odom MRN: [id number] Date of Birth: 1934-06-09 Referring Provider: Dorothyann Peng, NP   Encounter Date: 09/26/2017  PT End of Session - 09/26/17 1147    Visit Number  34    Number of Visits  39    Date for PT Re-Evaluation  10/17/17    Authorization Type  g-codes at 25, KX modifier needed    PT Start Time  1146    PT Stop Time  1225    PT Time Calculation (min)  39 min    Activity Tolerance  Patient tolerated treatment well    Behavior During Therapy  Madison Odom for tasks assessed/performed       Past Medical History:  Diagnosis Date  . GERD (gastroesophageal reflux disease)   . Hyperlipidemia   . MVP (mitral valve prolapse)   . OP (osteoporosis)   . Pelvis fracture ([place]) 09/2015   CLOSED   . Scoliosis   . Spastic colon     Past Surgical History:  Procedure Laterality Date  . ABDOMINAL HYSTERECTOMY  1965  . APPENDECTOMY    . BILATERAL OOPHORECTOMY  1981  . EYE SURGERY    . ROTATOR CUFF REPAIR Right     There were no vitals filed for this visit.  Subjective Assessment - 09/26/17 1151    Subjective  I woke up this AM and my back actually hurt. i think it is the weather. Currently ok.     Currently in Pain?  No/denies    Multiple Pain Sites  No         [facility] PT Assessment - 09/26/17 0001      6 minute walk test results    Aerobic Endurance Distance Walked  720                  [facility] Adult PT Treatment/Exercise - 09/26/17 0001      Neuro Re-ed    Neuro Re-ed Details   Mini tramp: weight shifting, heel raises, marching No UE support, close SBA      Knee/Hip Exercises: Aerobic   Nustep  L1 x 10 min PT closely monitored, no shortness of breath today      Knee/Hip Exercises: Seated   Long Arc Quad  Strengthening;Both;2 sets;20  reps;Weights    Long Arc Quad Weight  4 lbs.    Clamshell with TheraBand  Red 3 x15               PT Short Term Goals - 06/25/17 1109      PT SHORT TERM GOAL #2   Title  berg balance score >/= 45/56     Baseline  46/56 (06/21/17)    Status  Achieved      PT SHORT TERM GOAL #3   Title  Pt will report improved lumbar fatigue/LBP with static standing in kitchen x 25% to prepare a meal    Status  Achieved        PT Long Term Goals - 09/26/17 1156      PT LONG TERM GOAL #6   Title  ambulate 650-700 feet in 6 minutes to improve endurance for community function    Baseline  610    Time  6    Period  Weeks    Status  Achieved 720 feet with RW  Plan - 09/26/17 1147    Clinical Impression Statement  Pt met 6 min walk test today achieving 720 feet with her RW. This made her slightly short of breath but was able to recover while seated fairly quickly.     Rehab Potential  Good    Clinical Impairments Affecting Rehab Potential  osteoporosis, R RCR    PT Frequency  2x / week    PT Duration  6 weeks    PT Treatment/Interventions  ADLs/Self Care Home Management;Functional mobility training;Stair training;Gait training;DME Instruction;Moist Heat;Therapeutic activities;Therapeutic exercise;Balance training;Neuromuscular re-education;Patient/family education;Manual techniques    PT Next Visit Plan  Address endurance, gait, strength and functional mobility.  Work on sit to stand, balance exercises. MMT of hips for goals in next 1-2 visits.     Consulted and Agree with Plan of Care  Patient       Patient will benefit from skilled therapeutic intervention in order to improve the following deficits and impairments:  Abnormal gait, Decreased activity tolerance, Decreased balance, Decreased mobility, Decreased endurance, Decreased range of motion, Decreased strength, Difficulty walking, Impaired flexibility, Postural dysfunction, Improper body mechanics, Pain  Visit  Diagnosis: Muscle weakness (generalized)  Abnormal posture  Other abnormalities of gait and mobility  Chronic midline low back pain without sciatica     Problem List Patient Active Problem List   Diagnosis Date Noted  . Epiretinal membrane, left eye 04/18/2017  . Acute blood loss anemia 09/27/2015  . Pelvic fracture ([place]) 09/21/2015  . Closed fracture of pelvis ([place])   . Hypertensive retinopathy of both eyes 08/16/2015  . Stye external 02/23/2015  . Hypertension 01/28/2014  . Kyphoscoliosis 09/16/2013  . After cataract 02/19/2013  . Pseudophakia of both eyes 01/22/2013  . Posterior capsular opacification 10/29/2012  . Cystoid macular edema 04/30/2012  . Epiretinal membrane 04/30/2012  . MVP (mitral valve prolapse) 05/24/2011  . Hypercholesterolemia 05/24/2011    Madison Odom, PTA 09/26/2017, 12:18 PM  [facility] [address] [place], [place], [address] Phone: [phone]   Fax:  [phone]  Name: Madison Odom MRN: [id number] Date of Birth: May 17, 1934

## 2017-10-01 ENCOUNTER — Encounter: Payer: Self-pay | Admitting: Physical Therapy

## 2017-10-01 ENCOUNTER — Ambulatory Visit: Payer: Medicare Other | Admitting: Physical Therapy

## 2017-10-01 DIAGNOSIS — R293 Abnormal posture: Secondary | ICD-10-CM

## 2017-10-01 DIAGNOSIS — M545 Low back pain: Secondary | ICD-10-CM

## 2017-10-01 DIAGNOSIS — M6281 Muscle weakness (generalized): Secondary | ICD-10-CM | POA: Diagnosis not present

## 2017-10-01 DIAGNOSIS — R2689 Other abnormalities of gait and mobility: Secondary | ICD-10-CM

## 2017-10-01 DIAGNOSIS — G8929 Other chronic pain: Secondary | ICD-10-CM

## 2017-10-01 NOTE — Therapy (Signed)
[facility] [address], [address] [place], [place], [address] Phone: [phone]   Fax:  [phone]  Physical Therapy Treatment  Patient Details  Name: Madison Odom MRN: [id number] Date of Birth: 07-27-1934 Referring Provider: Shirline Freesory Nafziger, NP   Encounter Date: 10/01/2017  PT End of Session - 10/01/17 1143    Visit Number  35    Number of Visits  39    Date for PT Re-Evaluation  10/17/17    Authorization Type  g-codes at 39, KX modifier needed    PT Start Time  1140    PT Stop Time  1220    PT Time Calculation (min)  40 min    Activity Tolerance  Patient tolerated treatment well    Behavior During Therapy  [facility] for tasks assessed/performed       Past Medical History:  Diagnosis Date  . GERD (gastroesophageal reflux disease)   . Hyperlipidemia   . MVP (mitral valve prolapse)   . OP (osteoporosis)   . Pelvis fracture ([facility]) 09/2015   CLOSED   . Scoliosis   . Spastic colon     Past Surgical History:  Procedure Laterality Date  . ABDOMINAL HYSTERECTOMY  1965  . APPENDECTOMY    . BILATERAL OOPHORECTOMY  1981  . EYE SURGERY    . ROTATOR CUFF REPAIR Right     There were no vitals filed for this visit.  Subjective Assessment - 10/01/17 1144    Subjective  No complaints this AM. My RT shoulder did hurt this AM when I got up.     Currently in Pain?  No/denies    Multiple Pain Sites  No                      [facility] Adult PT Treatment/Exercise - 10/01/17 0001      High Level Balance   High Level Balance Activities  -- Toe taps standing on black pad at stairs:20x2CGA lite Bil,       Neuro Re-ed    Neuro Re-ed Details   Mini tramp: weight shifting, heel raises, marching, hip abd,  No UE support, close SBA      Lumbar Exercises: Standing   Other Standing Lumbar Exercises  4 1/2 laps of ortho gym with focus on posture      Knee/Hip Exercises: Aerobic   Nustep  L1 x 10 min PT closely monitored, no  shortness of breath today      Knee/Hip Exercises: Seated   Long Arc Quad  Strengthening;Both;2 sets;20 reps;Weights    Long Arc Quad Weight  4 lbs.    Clamshell with TheraBand  Red 3 x15               PT Short Term Goals - 06/25/17 1109      PT SHORT TERM GOAL #2   Title  berg balance score >/= 45/56     Baseline  46/56 (06/21/17)    Status  Achieved      PT SHORT TERM GOAL #3   Title  Pt will report improved lumbar fatigue/LBP with static standing in kitchen x 25% to prepare a meal    Status  Achieved        PT Long Term Goals - 09/26/17 1156      PT LONG TERM GOAL #6   Title  ambulate 650-700 feet in 6 minutes to improve endurance for community function    Baseline  610  Time  6    Period  Weeks    Status  Achieved 720 feet with RW            Plan - 10/01/17 1144    Clinical Impression Statement  Pt reports she now has more confidence picking up light items off her floor at home without hte fear of falling. No evidence of unsteadiness with balance exercies but requied some UE support for toe taps on the balance pad.     Rehab Potential  Good    Clinical Impairments Affecting Rehab Potential  osteoporosis, R RCR    PT Frequency  2x / week    PT Duration  6 weeks    PT Treatment/Interventions  ADLs/Self Care Home Management;Functional mobility training;Stair training;Gait training;DME Instruction;Moist Heat;Therapeutic activities;Therapeutic exercise;Balance training;Neuromuscular re-education;Patient/family education;Manual techniques    PT Next Visit Plan  MMt hips for goals, anticipate DC.     Consulted and Agree with Plan of Care  Patient       Patient will benefit from skilled therapeutic intervention in order to improve the following deficits and impairments:  Abnormal gait, Decreased activity tolerance, Decreased balance, Decreased mobility, Decreased endurance, Decreased range of motion, Decreased strength, Difficulty walking, Impaired flexibility,  Postural dysfunction, Improper body mechanics, Pain  Visit Diagnosis: Muscle weakness (generalized)  Abnormal posture  Other abnormalities of gait and mobility  Chronic midline low back pain without sciatica     Problem List Patient Active Problem List   Diagnosis Date Noted  . Epiretinal membrane, left eye 04/18/2017  . Acute blood loss anemia 09/27/2015  . Pelvic fracture ([facility]) 09/21/2015  . Closed fracture of pelvis ([facility])   . Hypertensive retinopathy of both eyes 08/16/2015  . Stye external 02/23/2015  . Hypertension 01/28/2014  . Kyphoscoliosis 09/16/2013  . After cataract 02/19/2013  . Pseudophakia of both eyes 01/22/2013  . Posterior capsular opacification 10/29/2012  . Cystoid macular edema 04/30/2012  . Epiretinal membrane 04/30/2012  . MVP (mitral valve prolapse) 05/24/2011  . Hypercholesterolemia 05/24/2011    Fergie Sherbert, PTA  10/01/2017, 12:11 PM  [facility] [address], [address] [place], [place], [address] Phone: [phone]   Fax:  [phone]  Name: Madison Odom MRN: [id number] Date of Birth: 02-28-1934

## 2017-10-06 ENCOUNTER — Other Ambulatory Visit: Payer: Self-pay | Admitting: Cardiology

## 2017-10-08 ENCOUNTER — Encounter: Payer: Self-pay | Admitting: Physical Therapy

## 2017-10-08 ENCOUNTER — Ambulatory Visit: Payer: Medicare Other | Admitting: Physical Therapy

## 2017-10-08 DIAGNOSIS — R293 Abnormal posture: Secondary | ICD-10-CM

## 2017-10-08 DIAGNOSIS — M545 Low back pain, unspecified: Secondary | ICD-10-CM

## 2017-10-08 DIAGNOSIS — M6281 Muscle weakness (generalized): Secondary | ICD-10-CM

## 2017-10-08 DIAGNOSIS — G8929 Other chronic pain: Secondary | ICD-10-CM

## 2017-10-08 DIAGNOSIS — R2689 Other abnormalities of gait and mobility: Secondary | ICD-10-CM

## 2017-10-08 NOTE — Therapy (Signed)
[facility] [address], [address] [place], [place], [address] Phone: [phone]   Fax:  [phone]  Physical Therapy Treatment  Patient Details  Name: Madison Odom MRN: [id number] Date of Birth: [birth year] Referring Provider: Shirline Freesory Nafziger, NP   Encounter Date: 10/08/2017  PT End of Session - 10/08/17 1147    Visit Number  36    Number of Visits  39    Date for PT Re-Evaluation  10/17/17    Authorization Type  g-codes at 39, KX modifier needed    PT Start Time  1146    PT Stop Time  1231    PT Time Calculation (min)  45 min    Activity Tolerance  Patient tolerated treatment well    Behavior During Therapy  [facility] for tasks assessed/performed       Past Medical History:  Diagnosis Date  . GERD (gastroesophageal reflux disease)   . Hyperlipidemia   . MVP (mitral valve prolapse)   . OP (osteoporosis)   . Pelvis fracture ([facility]) 09/2015   CLOSED   . Scoliosis   . Spastic colon     Past Surgical History:  Procedure Laterality Date  . ABDOMINAL HYSTERECTOMY  1965  . APPENDECTOMY    . BILATERAL OOPHORECTOMY  1981  . EYE SURGERY    . ROTATOR CUFF REPAIR Right     There were no vitals filed for this visit.  Subjective Assessment - 10/08/17 1204    Subjective  A little achy in my joints today.    Pertinent History  LBP for several years especially standing in the kitchen. Most recent fall March 2018 picking up paper off of the kitchen floor. Felt like she couldn't walk around grocery store after the fall and then fell again onto daughter amb up curb from store. Denies vertigo. Uses the rollator at home. Lives alone. Has driver.     Currently in Pain?  No/denies    Multiple Pain Sites  No                      [facility] Adult PT Treatment/Exercise - 10/08/17 0001      Neuro Re-ed    Neuro Re-ed Details   Seated toe/flexion ankle PF/DF for better toe & ankle clearance  Standing on black pad alt toe taps at  stairs; 2 finger suppo      Knee/Hip Exercises: Aerobic   Nustep  L1 x 10 min PT closely monitored, no shortness of breath today      Knee/Hip Exercises: Seated   Long Arc Quad  Strengthening;Both;2 sets;20 reps;Weights    Long Arc Quad Weight  4 lbs.    Ball Squeeze  30x    Clamshell with TheraBand  Red 3 x15             PT Education - 10/08/17 1400    Education provided  Yes    Education Details  Toe extension strength and ankle DF strength for HEP and fall prevention    Person(s) Educated  Patient    Methods  Explanation;Demonstration;Tactile cues;Verbal cues    Comprehension  Verbalized understanding;Returned demonstration       PT Short Term Goals - 06/25/17 1109      PT SHORT TERM GOAL #2   Title  berg balance score >/= 45/56     Baseline  46/56 (06/21/17)    Status  Achieved      PT SHORT TERM GOAL #3  Title  Pt will report improved lumbar fatigue/LBP with static standing in kitchen x 25% to prepare a meal    Status  Achieved        PT Long Term Goals - 09/26/17 1156      PT LONG TERM GOAL #6   Title  ambulate 650-700 feet in 6 minutes to improve endurance for community function    Baseline  610    Time  6    Period  Weeks    Status  Achieved 720 feet with RW            Plan - 10/08/17 1356    Clinical Impression Statement  Pt reports she was able to make a Thanksgiving pie without any issues which included carrying a mixer and or bowl alonside pushing her walker. Pt does have a tendency to "stubb" her foot into the black pad as she is trying to step up on it. Because of this we worked on some HEP for ankle DF and toe extension work.  No falls.     Rehab Potential  Good    Clinical Impairments Affecting Rehab Potential  osteoporosis, R RCR    PT Frequency  2x / week    PT Duration  6 weeks    PT Treatment/Interventions  ADLs/Self Care Home Management;Functional mobility training;Stair training;Gait training;DME Instruction;Moist Heat;Therapeutic  activities;Therapeutic exercise;Balance training;Neuromuscular re-education;Patient/family education;Manual techniques    PT Next Visit Plan  MMt hips for goals, anticipate DC soon.     Consulted and Agree with Plan of Care  Patient       Patient will benefit from skilled therapeutic intervention in order to improve the following deficits and impairments:  Abnormal gait, Decreased activity tolerance, Decreased balance, Decreased mobility, Decreased endurance, Decreased range of motion, Decreased strength, Difficulty walking, Impaired flexibility, Postural dysfunction, Improper body mechanics, Pain  Visit Diagnosis: Muscle weakness (generalized)  Abnormal posture  Other abnormalities of gait and mobility  Chronic midline low back pain without sciatica     Problem List Patient Active Problem List   Diagnosis Date Noted  . Epiretinal membrane, left eye 04/18/2017  . Acute blood loss anemia 09/27/2015  . Pelvic fracture ([facility]) 09/21/2015  . Closed fracture of pelvis ([facility])   . Hypertensive retinopathy of both eyes 08/16/2015  . Stye external 02/23/2015  . Hypertension 01/28/2014  . Kyphoscoliosis 09/16/2013  . After cataract 02/19/2013  . Pseudophakia of both eyes 01/22/2013  . Posterior capsular opacification 10/29/2012  . Cystoid macular edema 04/30/2012  . Epiretinal membrane 04/30/2012  . MVP (mitral valve prolapse) 05/24/2011  . Hypercholesterolemia 05/24/2011    Pratt Bress, PTA 10/08/2017, 2:01 PM  [facility] [address], [address] [place], [place], [address] Phone: [phone]   Fax:  [phone]  Name: Madison Odom MRN: [id number] Date of Birth: July 10, 1934

## 2017-10-10 ENCOUNTER — Ambulatory Visit: Payer: Medicare Other

## 2017-10-10 DIAGNOSIS — M545 Low back pain: Secondary | ICD-10-CM

## 2017-10-10 DIAGNOSIS — R293 Abnormal posture: Secondary | ICD-10-CM

## 2017-10-10 DIAGNOSIS — R2689 Other abnormalities of gait and mobility: Secondary | ICD-10-CM

## 2017-10-10 DIAGNOSIS — M6281 Muscle weakness (generalized): Secondary | ICD-10-CM

## 2017-10-10 DIAGNOSIS — G8929 Other chronic pain: Secondary | ICD-10-CM

## 2017-10-10 NOTE — Therapy (Addendum)
[facility] [address], [address] [place], [place], [address] Phone: [phone]   Fax:  [phone]  Physical Therapy Treatment  Patient Details  Name: Madison Odom MRN: [id number] Date of Birth: [birth year] Referring Provider: Shirline Freesory Nafziger, NP   Encounter Date: 10/10/2017  PT End of Session - 10/10/17 1214    Visit Number  37    Number of Visits  39    Date for PT Re-Evaluation  10/17/17    Authorization Type  g-codes at 39, KX modifier needed    PT Start Time  1143    PT Stop Time  1220    PT Time Calculation (min)  37 min    Activity Tolerance  Patient tolerated treatment well    Behavior During Therapy  [facility] for tasks assessed/performed       Past Medical History:  Diagnosis Date  . GERD (gastroesophageal reflux disease)   . Hyperlipidemia   . MVP (mitral valve prolapse)   . OP (osteoporosis)   . Pelvis fracture (HCC) 09/2015   CLOSED   . Scoliosis   . Spastic colon     Past Surgical History:  Procedure Laterality Date  . ABDOMINAL HYSTERECTOMY  1965  . APPENDECTOMY    . BILATERAL OOPHORECTOMY  1981  . EYE SURGERY    . ROTATOR CUFF REPAIR Right     There were no vitals filed for this visit.  Subjective Assessment - 10/10/17 1152    Subjective  I was very achy in my back yesterday.  I rested and took some Tylenol.  Much better today.      Currently in Pain?  Yes    Pain Score  1     Pain Location  Back    Pain Orientation  Right;Lower    Pain Descriptors / Indicators  Sore    Pain Type  Chronic pain    Aggravating Factors   activity, standing    Pain Relieving Factors  rest, heat, Tylenol                      [facility] Adult PT Treatment/Exercise - 10/10/17 0001      Neuro Re-ed    Neuro Re-ed Details   Seated toe/flexion ankle PF/DF for better toe & ankle clearance  Standing on black pad alt toe taps at stairs; 2 finger suppo      Lumbar Exercises: Standing   Other Standing  Lumbar Exercises  7.5 minutes with focus on posture and foot clearance      Knee/Hip Exercises: Aerobic   Nustep  L1 x 5 min PT closely monitored, stopped at 5 minutes due to breathing      Knee/Hip Exercises: Seated   Long Arc Quad  Strengthening;Both;2 sets;20 reps;Weights    Long Arc Quad Weight  4 lbs.    Ball Squeeze  30x    Clamshell with TheraBand  Red 3 x15               PT Short Term Goals - 06/25/17 1109      PT SHORT TERM GOAL #2   Title  berg balance score >/= 45/56     Baseline  46/56 (06/21/17)    Status  Achieved      PT SHORT TERM GOAL #3   Title  Pt will report improved lumbar fatigue/LBP with static standing in kitchen x 25% to prepare a meal    Status  Achieved  PT Long Term Goals - 10/10/17 1157      PT LONG TERM GOAL #3   Title  Pt will report no LBP/lumbar fatigue with WB activities >/= 45 min    Status  Achieved      PT LONG TERM GOAL #6   Title  ambulate 650-700 feet in 6 minutes to improve endurance for community function    Status  Achieved            Plan - 10/10/17 1200    Clinical Impression Statement  Pt has had increased LBP at home over the past week due to activity.  Pain is better today.  Pt with chonic LBP and endurance deficits and requires close supervision with all activity.  Pt will likely discharge to HEP next week due to plateau.      PT Frequency  2x / week    PT Duration  6 weeks    PT Treatment/Interventions  ADLs/Self Care Home Management;Functional mobility training;Stair training;Gait training;DME Instruction;Moist Heat;Therapeutic activities;Therapeutic exercise;Balance training;Neuromuscular re-education;Patient/family education;Manual techniques    PT Next Visit Plan  D/C next week.  Berg Balance test and MMT.      Recommended Other Services  cert and recert signed    Consulted and Agree with Plan of Care  Patient       Patient will benefit from skilled therapeutic intervention in order to improve the  following deficits and impairments:  Abnormal gait, Decreased activity tolerance, Decreased balance, Decreased mobility, Decreased endurance, Decreased range of motion, Decreased strength, Difficulty walking, Impaired flexibility, Postural dysfunction, Improper body mechanics, Pain  Visit Diagnosis: Muscle weakness (generalized)  Abnormal posture  Other abnormalities of gait and mobility  Chronic midline low back pain without sciatica     Problem List Patient Active Problem List   Diagnosis Date Noted  . Epiretinal membrane, left eye 04/18/2017  . Acute blood loss anemia 09/27/2015  . Pelvic fracture (HCC) 09/21/2015  . Closed fracture of pelvis (HCC)   . Hypertensive retinopathy of both eyes 08/16/2015  . Stye external 02/23/2015  . Hypertension 01/28/2014  . Kyphoscoliosis 09/16/2013  . After cataract 02/19/2013  . Pseudophakia of both eyes 01/22/2013  . Posterior capsular opacification 10/29/2012  . Cystoid macular edema 04/30/2012  . Epiretinal membrane 04/30/2012  . MVP (mitral valve prolapse) 05/24/2011  . Hypercholesterolemia 05/24/2011     Lorrene ReidKelly Alita Waldren, PT 10/10/17 12:23 PM  [facility] [address], [address] [place], [place], [address] Phone: [phone]   Fax:  [phone]  Name: Madison Odom MRN: [id number] Date of Birth: [birth year]

## 2017-10-15 ENCOUNTER — Ambulatory Visit: Payer: Medicare Other | Attending: Adult Health

## 2017-10-15 DIAGNOSIS — M545 Low back pain, unspecified: Secondary | ICD-10-CM

## 2017-10-15 DIAGNOSIS — G8929 Other chronic pain: Secondary | ICD-10-CM | POA: Diagnosis present

## 2017-10-15 DIAGNOSIS — R293 Abnormal posture: Secondary | ICD-10-CM | POA: Insufficient documentation

## 2017-10-15 DIAGNOSIS — M6281 Muscle weakness (generalized): Secondary | ICD-10-CM | POA: Diagnosis not present

## 2017-10-15 DIAGNOSIS — R2689 Other abnormalities of gait and mobility: Secondary | ICD-10-CM

## 2017-10-15 NOTE — Therapy (Signed)
[facility] [address] [place], [place], [address] Phone: [phone]   Fax:  [phone]  Physical Therapy Treatment  Patient Details  Name: Madison Odom MRN: [id number] Date of Birth: November 10, 1934 Referring Provider: [name], NP   Encounter Date: 10/15/2017  PT End of Session - 10/15/17 1231    Visit Number  38    Number of Visits  39    Date for PT Re-Evaluation  10/17/17    Authorization Type  g-codes at 39, KX modifier needed    PT Start Time  1147    PT Stop Time  1229    PT Time Calculation (min)  42 min    Activity Tolerance  Patient tolerated treatment well    Behavior During Therapy  [facility] for tasks assessed/performed       Past Medical History:  Diagnosis Date  . GERD (gastroesophageal reflux disease)   . Hyperlipidemia   . MVP (mitral valve prolapse)   . OP (osteoporosis)   . Pelvis fracture (HCC) 09/2015   CLOSED   . Scoliosis   . Spastic colon     Past Surgical History:  Procedure Laterality Date  . ABDOMINAL HYSTERECTOMY  1965  . APPENDECTOMY    . BILATERAL OOPHORECTOMY  1981  . EYE SURGERY    . ROTATOR CUFF REPAIR Right     There were no vitals filed for this visit.  Subjective Assessment - 10/15/17 1152    Subjective  I really want to keep coming because my balance isn't what it should be.      Currently in Pain?  No/denies         [facility] PT Assessment - 10/15/17 0001      Assessment   Next MD Visit  11/2017      Strength   Overall Strength  Deficits    Overall Strength Comments  bil hip flexion 4/5, knee extension 4+/5, knee flexion 4+/5, DF 4/5      6 minute walk test results    Endurance additional comments  5 minutes with fatigue      Berg Balance Test   Sit to Stand  Able to stand  independently using hands    Standing Unsupported  Able to stand safely 2 minutes    Sitting with Back Unsupported but Feet Supported on Floor or Stool  Able to sit safely and  securely 2 minutes    Stand to Sit  Controls descent by using hands    Transfers  Able to transfer safely, definite need of hands    Standing Unsupported with Eyes Closed  Able to stand 10 seconds with supervision    Standing Ubsupported with Feet Together  Able to place feet together independently and stand for 1 minute with supervision    From Standing, Reach Forward with Outstretched Arm  Can reach forward >5 cm safely (2")    From Standing Position, Pick up Object from Floor  Able to pick up shoe, needs supervision    From Standing Position, Turn to Look Behind Over each Shoulder  Looks behind one side only/other side shows less weight shift    Turn 360 Degrees  Needs close supervision or verbal cueing    Standing Unsupported, Alternately Place Feet on Step/Stool  Able to complete 4 steps without aid or supervision    Standing Unsupported, One Foot in Front  Needs help to step but can hold 15 seconds    Standing on One Leg  Able to lift leg independently and hold equal to or more than 3 seconds    Total Score  37    Berg comment:  > 80% falls risk                  [facility] Adult PT Treatment/Exercise - 10/15/17 0001      Neuro Re-ed    Neuro Re-ed Details   Seated toe/flexion ankle PF/DF for better toe & ankle clearance  Standing on black pad alt toe taps at stairs; 2 finger suppo      Knee/Hip Exercises: Aerobic   Nustep  L1 x 5 min PT closely monitored, stopped at 5 minutes due to breathing      Knee/Hip Exercises: Seated   Long Arc Quad  Strengthening;Both;2 sets;20 reps;Weights    Long Arc Quad Weight  4 lbs.    Ball Squeeze  30x    Clamshell with TheraBand  Red 3 x15               PT Short Term Goals - 06/25/17 1109      PT SHORT TERM GOAL #2   Title  berg balance score >/= 45/56     Baseline  46/56 (06/21/17)    Status  Achieved      PT SHORT TERM GOAL #3   Title  Pt will report improved lumbar fatigue/LBP with static standing in kitchen x 25% to  prepare a meal    Status  Achieved        PT Long Term Goals - 10/15/17 1157      PT LONG TERM GOAL #3   Title  Pt will report no LBP/lumbar fatigue with WB activities >/= 45 min      PT LONG TERM GOAL #4   Title  pt will demo improved lumbar/core strength to good + to assist with decreased LBP with amb in grocery store    Baseline  30-40 minutes     Status  Achieved            Plan - 10/15/17 1225    Clinical Impression Statement  PT restested Berg Balance test and score was not improved today.  Pt scored 37/56 today indicating >80% falls risk.  Last time tested this was 43/56.  Pt has reached a plateau in PT services although she continues to have chronic balance and strength deficits. PT will use final PT session to finalize HEP.      Clinical Impairments Affecting Rehab Potential  osteoporosis, R RCR    PT Frequency  2x / week    PT Duration  6 weeks    PT Treatment/Interventions  ADLs/Self Care Home Management;Functional mobility training;Stair training;Gait training;DME Instruction;Moist Heat;Therapeutic activities;Therapeutic exercise;Balance training;Neuromuscular re-education;Patient/family education;Manual techniques    PT Next Visit Plan  D/C Next visit to HEP    Consulted and Agree with Plan of Care  Patient       Patient will benefit from skilled therapeutic intervention in order to improve the following deficits and impairments:  Abnormal gait, Decreased activity tolerance, Decreased balance, Decreased mobility, Decreased endurance, Decreased range of motion, Decreased strength, Difficulty walking, Impaired flexibility, Postural dysfunction, Improper body mechanics, Pain  Visit Diagnosis: Muscle weakness (generalized)  Abnormal posture  Other abnormalities of gait and mobility  Chronic midline low back pain without sciatica     Problem List Patient Active Problem List   Diagnosis Date Noted  . Epiretinal membrane, left eye 04/18/2017  . Acute blood  loss anemia  09/27/2015  . Pelvic fracture (HCC) 09/21/2015  . Closed fracture of pelvis (HCC)   . Hypertensive retinopathy of both eyes 08/16/2015  . Stye external 02/23/2015  . Hypertension 01/28/2014  . Kyphoscoliosis 09/16/2013  . After cataract 02/19/2013  . Pseudophakia of both eyes 01/22/2013  . Posterior capsular opacification 10/29/2012  . Cystoid macular edema 04/30/2012  . Epiretinal membrane 04/30/2012  . MVP (mitral valve prolapse) 05/24/2011  . Hypercholesterolemia 05/24/2011     Lorrene ReidKelly Lezli Danek, PT 10/15/17 12:34 PM  [facility] [address], [address] [place], [place], [address] Phone: [phone]   Fax:  [phone]  Name: Madison Odom MRN: [id number] Date of Birth: 23-Oct-1934

## 2017-10-17 ENCOUNTER — Ambulatory Visit: Payer: Medicare Other

## 2017-10-17 DIAGNOSIS — R2689 Other abnormalities of gait and mobility: Secondary | ICD-10-CM

## 2017-10-17 DIAGNOSIS — M6281 Muscle weakness (generalized): Secondary | ICD-10-CM

## 2017-10-17 DIAGNOSIS — R293 Abnormal posture: Secondary | ICD-10-CM

## 2017-10-17 NOTE — Therapy (Signed)
[facility] [address], [place], [place], [address] Phone: [phone]   Fax:  [phone]  Physical Therapy Treatment  Patient Details  Name: Madison Odom MRN: [id number] Date of Birth: 13-Feb-1934 Referring Provider: Dorothyann Peng, NP   Encounter Date: 10/17/2017  PT End of Session - 10/17/17 1239    Visit Number  68    PT Start Time  9030    PT Stop Time  1228    PT Time Calculation (min)  41 min    Activity Tolerance  Patient tolerated treatment well    Behavior During Therapy  [facility] for tasks assessed/performed       Past Medical History:  Diagnosis Date  . GERD (gastroesophageal reflux disease)   . Hyperlipidemia   . MVP (mitral valve prolapse)   . OP (osteoporosis)   . Pelvis fracture ([place]) 09/2015   CLOSED   . Scoliosis   . Spastic colon     Past Surgical History:  Procedure Laterality Date  . ABDOMINAL HYSTERECTOMY  1965  . APPENDECTOMY    . BILATERAL OOPHORECTOMY  1981  . EYE SURGERY    . ROTATOR CUFF REPAIR Right     There were no vitals filed for this visit.  Subjective Assessment - 10/17/17 1154    Subjective  I really want to keep coming to therapy.      Pertinent History  LBP for several years especially standing in the kitchen. Most recent fall March 2018 picking up paper off of the kitchen floor. Felt like she couldn't walk around grocery store after the fall and then fell again onto daughter amb up curb from store. Denies vertigo. Uses the rollator at home. Lives alone. Has driver.     Currently in Pain?  No/denies         [facility] PT Assessment - 10/17/17 0001      Assessment   Medical Diagnosis  LBP, balance, ataxic gait      Strength   Overall Strength  Deficits    Overall Strength Comments  bil hip flexion 4/5, knee extension 4+/5, knee flexion 4+/5, DF 4/5      Ambulation/Gait   Gait Comments  6 minute walk test: 560 feet with rollator      6 minute walk test results     Endurance additional comments  5 minutes with fatigue      Berg Balance Test   Standing Unsupported  Able to stand safely 2 minutes    Sitting with Back Unsupported but Feet Supported on Floor or Stool  Able to sit safely and securely 2 minutes    Stand to Sit  Controls descent by using hands    Transfers  Able to transfer safely, definite need of hands    Standing Unsupported with Eyes Closed  Able to stand 10 seconds with supervision    Standing Ubsupported with Feet Together  Able to place feet together independently and stand for 1 minute with supervision    From Standing, Reach Forward with Outstretched Arm  Can reach forward >5 cm safely (2")    From Standing Position, Pick up Object from Floor  Able to pick up shoe, needs supervision    From Standing Position, Turn to Look Behind Over each Shoulder  Looks behind one side only/other side shows less weight shift    Turn 360 Degrees  Needs close supervision or verbal cueing    Standing Unsupported, Alternately Place Feet on Step/Stool  Able  to complete 4 steps without aid or supervision    Standing Unsupported, One Foot in [place] help to step but can hold 15 seconds    Standing on One Leg  Able to lift leg independently and hold equal to or more than 3 seconds    Berg comment:  > 80% falls risk                  [facility] Adult PT Treatment/Exercise - 10/17/17 0001      Neuro Re-ed    Neuro Re-ed Details   Seated toe/flexion ankle PF/DF for better toe & ankle clearance  Standing on black pad alt toe taps at stairs; 2 finger suppo      Knee/Hip Exercises: Aerobic   Nustep  L1 x 5 min PT closely monitored, stopped at 5 minutes due to breathing      Knee/Hip Exercises: Standing   Other Standing Knee Exercises  standing on black balance pad: static standing, toe taps- close supervision by PT for safety      Knee/Hip Exercises: Seated   Long Arc Quad  Strengthening;Both;2 sets;20 reps;Weights    Long Arc Quad Weight  4  lbs.    Ball Squeeze  30x    Clamshell with TheraBand  Red 3 x15               PT Short Term Goals - 06/25/17 1109      PT SHORT TERM GOAL #2   Title  berg balance score >/= 45/56     Baseline  46/56 (06/21/17)    Status  Achieved      PT SHORT TERM GOAL #3   Title  Pt will report improved lumbar fatigue/LBP with static standing in kitchen x 25% to prepare a meal    Status  Achieved        PT Long Term Goals - 10/17/17 1154      PT LONG TERM GOAL #1   Title  Pt will demo improved bil LE strength >/= 4+/5 all major muscle groups to assist with decreased falls risk    Baseline  4+/5 knees, 4/5 hips, 4/5 DF    Status  On-going      PT LONG TERM GOAL #2   Title  Berg balance score >/= 43/56 to reduce risk of falls      PT LONG TERM GOAL #3   Title  Pt will report no LBP/lumbar fatigue with WB activities >/= 45 min    Status  Achieved      PT LONG TERM GOAL #4   Title  pt will demo improved lumbar/core strength to good + to assist with decreased LBP with amb in grocery store    Status  Achieved      PT LONG TERM GOAL #5   Title  go to the grocery store and shop while holding onto a basket by herself due to [employer] balance >/= 55/56    Baseline  37/56    Status  Not Met      PT LONG TERM GOAL #6   Title  ambulate 650-700 feet in 6 minutes to improve endurance for community function    Baseline  560 feet    Status  Not Met            Plan - 10/17/17 1214    Clinical Impression Statement  Pt has reached a plateau with PT services.  No change in balance, endurance and strength over the past  month.  Pt has a comprehensive HEP in place for strength and endurance.  Pt with new onset of shortness of breath > 1 month ago and is seeing MD for this.  Pt with chronic balance and strength deficits and will continue to work on exercises for gains/maintenance at home.      PT Next Visit Plan  D/C to HEP    Consulted and Agree with Plan of Care  Patient       Patient will  benefit from skilled therapeutic intervention in order to improve the following deficits and impairments:     Visit Diagnosis: Muscle weakness (generalized)  Abnormal posture  Other abnormalities of gait and mobility   G-Codes - 10/28/17 1220    Functional Assessment Tool Used (Outpatient Only)  Berg 37/56, 6 min walk 560 feet    Functional Limitation  Mobility: Walking and moving around    Mobility: Walking and Moving Around Goal Status [phone])  At least 1 percent but less than 20 percent impaired, limited or restricted    Mobility: Walking and Moving Around Discharge Status [phone])  At least 20 percent but less than 40 percent impaired, limited or restricted       Problem List Patient Active Problem List   Diagnosis Date Noted  . Epiretinal membrane, left eye 04/18/2017  . Acute blood loss anemia 09/27/2015  . Pelvic fracture ([place]) 09/21/2015  . Closed fracture of pelvis ([place])   . Hypertensive retinopathy of both eyes 08/16/2015  . Stye external 02/23/2015  . Hypertension 01/28/2014  . Kyphoscoliosis 09/16/2013  . After cataract 02/19/2013  . Pseudophakia of both eyes 01/22/2013  . Posterior capsular opacification 10/29/2012  . Cystoid macular edema 04/30/2012  . Epiretinal membrane 04/30/2012  . MVP (mitral valve prolapse) 05/24/2011  . Hypercholesterolemia 05/24/2011   PHYSICAL THERAPY DISCHARGE SUMMARY  Visits from Start of Care: 39  Current functional level related to goals / functional outcomes: Pt has reached maximum rehab potential with PT and will discharge to HEP.   Remaining deficits: Chronic balance, strength and endurance deficits.  See above for objective testing.     Education / Equipment: HEP, fall prevention, how to advance exercises for strength and endurance.   Plan: Patient agrees to discharge.  Patient goals were partially met. Patient is being discharged due to being pleased with the current functional level.  ?????maximized rehab potential.         Sigurd Sos, PT 28-Oct-2017 12:41 PM  [facility] [address], [place], [place], [address] Phone: [phone]   Fax:  [phone]  Name: Madison Odom MRN: [id number] Date of Birth: [birth year]

## 2017-10-24 ENCOUNTER — Telehealth: Payer: Self-pay | Admitting: Family Medicine

## 2017-10-24 DIAGNOSIS — R2681 Unsteadiness on feet: Secondary | ICD-10-CM

## 2017-10-24 DIAGNOSIS — M549 Dorsalgia, unspecified: Principal | ICD-10-CM

## 2017-10-24 DIAGNOSIS — G8929 Other chronic pain: Secondary | ICD-10-CM

## 2017-10-24 NOTE — Telephone Encounter (Signed)
Called and spoke to the pt.  She states her insurance does not limit her on how many sessions she has.  She continues to have trouble with her gait.  She states she has almost fell numerous times but was able to grab on to something around her.  Requesting referral back to PT.  Please advise.  Thanks!!

## 2017-10-24 NOTE — Telephone Encounter (Signed)
Order placed

## 2017-10-24 NOTE — Telephone Encounter (Signed)
Noted.  See telephone call

## 2017-10-24 NOTE — Telephone Encounter (Signed)
Copied from CRM #[id number]. Topic: General - Other >> Oct 24, 2017  3:51 PM Oneal GroutSebastian, Jennifer S wrote: Insurance states that she can have unlimited visits of PT

## 2017-10-24 NOTE — Telephone Encounter (Signed)
Copied from CRM #[id number]. Topic: General - Other >> Oct 24, 2017 11:00 AM Percival SpanishKennedy, Cheryl W wrote:   Pt call to ask if she can have more Physical Therapy for balance issues    [phone]

## 2017-10-24 NOTE — Telephone Encounter (Signed)
I am ok with her going back to PT

## 2017-10-29 ENCOUNTER — Telehealth: Payer: Self-pay | Admitting: Adult Health

## 2017-10-29 DIAGNOSIS — M6281 Muscle weakness (generalized): Secondary | ICD-10-CM

## 2017-10-29 NOTE — Telephone Encounter (Signed)
M62.81 should work

## 2017-10-29 NOTE — Telephone Encounter (Signed)
Called and spoke to the pt.  She would like to be set up through [facility].  Need dx code.

## 2017-10-29 NOTE — Telephone Encounter (Signed)
Ok for PT orders 

## 2017-10-29 NOTE — Telephone Encounter (Signed)
Copied from CRM [phone]#[id number]. Topic: Quick Communication - See Telephone Encounter >> Oct 29, 2017  9:28 AM Diana EvesHoyt, Maryann B wrote: CRM for notification. See Telephone encounter for:  Pt wants Kandee KeenCory to know she has completed Balance Therapy but she would like to go for upper body strengthening and they told her if Kandee KeenCory would ok it she could come back and have that done  10/29/17.

## 2017-10-29 NOTE — Telephone Encounter (Signed)
Referral placed in Epic.

## 2017-11-15 ENCOUNTER — Other Ambulatory Visit: Payer: Medicare Other

## 2017-11-21 ENCOUNTER — Encounter: Payer: Self-pay | Admitting: Physical Therapy

## 2017-11-21 ENCOUNTER — Ambulatory Visit: Payer: Medicare Other | Attending: Adult Health | Admitting: Physical Therapy

## 2017-11-21 DIAGNOSIS — M25612 Stiffness of left shoulder, not elsewhere classified: Secondary | ICD-10-CM | POA: Diagnosis present

## 2017-11-21 DIAGNOSIS — R293 Abnormal posture: Secondary | ICD-10-CM | POA: Insufficient documentation

## 2017-11-21 DIAGNOSIS — M25611 Stiffness of right shoulder, not elsewhere classified: Secondary | ICD-10-CM | POA: Diagnosis present

## 2017-11-21 DIAGNOSIS — M6281 Muscle weakness (generalized): Secondary | ICD-10-CM | POA: Diagnosis present

## 2017-11-21 NOTE — Patient Instructions (Signed)
   Supination TheraBand  While standing, wrap theraband around wrist while palm is facing down then against the resistance from the theraband turn your forearm keeping your wrist locked palm up. x10 reps, atleast 2x a day       Pronation TheraBand  While standing, wrap theraband around wrist starting with palm facing the sky. Then while keep wrist locked in a neutral position turn the forearm so palm faces downward. x10 reps, atleast 2x a day.      TOWEL GRIP  Place a rolled up towel in your hand and squeeze.    Hold 5 sec, repeat atleast 10 times.      SCAPULAR RETRACTIONS  Draw your shoulder blades back and down.  [facility] reps   [facility] [address], [address] [place], [place] [address] Phone # [phone] Fax [phone]

## 2017-11-21 NOTE — Therapy (Signed)
[facility] [address] [place], [place], [address] Phone: [phone]   Fax:  [phone]  Physical Therapy Evaluation  Patient Details  Name: Madison Odom MRN: [id number] Date of Birth: 1934-03-20 Referring Provider: [name], NP   Encounter Date: 11/21/2017  PT End of Session - 11/21/17 1133    Visit Number  1    Number of Visits  16    Authorization Type  [employer] Medicare     Authorization Time Period  11/21/17 to 01/16/18    PT Start Time  1100    PT Stop Time  1138    PT Time Calculation (min)  38 min    Activity Tolerance  Patient tolerated treatment well;No increased pain    Behavior During Therapy  WFL for tasks assessed/performed       Past Medical History:  Diagnosis Date  . GERD (gastroesophageal reflux disease)   . Hyperlipidemia   . MVP (mitral valve prolapse)   . OP (osteoporosis)   . Pelvis fracture (HCC) 09/2015   CLOSED   . Scoliosis   . Spastic colon     Past Surgical History:  Procedure Laterality Date  . ABDOMINAL HYSTERECTOMY  1965  . APPENDECTOMY    . BILATERAL OOPHORECTOMY  1981  . EYE SURGERY    . ROTATOR CUFF REPAIR Right     There were no vitals filed for this visit.   Subjective Assessment - 11/21/17 1104    Subjective  Pt reports that she was coming here for therapy on her balance and that has improved alot. She is having trouble opening jars/waterbottles and picking things up without difficulty. She would like to improve this.     Pertinent History  LBP for several years especially standing in the kitchen. Most recent fall March 2018 picking up paper off of the kitchen floor. Felt like she couldn't walk around grocery store after the fall and then fell again onto daughter amb up curb from store. Denies vertigo. Uses the rollator at home. Lives alone. Has driver.     Limitations  Lifting;Other (comment) opening jars/water bottles     Currently in Pain?  No/denies          [facility] PT Assessment - 11/21/17 0001      Assessment   Medical Diagnosis  Muscle weakness/upper extremity weakness     Referring Provider  [name], NP    Hand Dominance  Right    Next MD Visit  next week     Prior Therapy  last year for balance/LE strengthening       Precautions   Precautions  None      Balance Screen   Has the patient fallen in the past 6 months  No    Has the patient had a decrease in activity level because of a fear of falling?   No    Is the patient reluctant to leave their home because of a fear of falling?   No      Home Environment   Living Environment  Private residence    Additional Comments  caregiver that comes Monday and Wednesday for 4 hours to help with any chores/errands       Prior Function   Level of Independence  Independent with basic ADLs      ROM / Strength   AROM / PROM / Strength  AROM;Strength      AROM   Overall AROM Comments  Shoulder flexion atleast  50-75% limited BUE, Right worse than Lt Rt shoulder flexion to 70 deg     AROM Assessment Site  --    Right/Left Shoulder  --      Strength   Strength Assessment Site  Shoulder;Elbow;Wrist;Hand    Right/Left Shoulder  Right;Left    Right Shoulder Flexion  3/5    Right Shoulder ABduction  3-/5    Right Shoulder Internal Rotation  3/5    Right Shoulder External Rotation  3/5 0 deg ROM with elbow by side    Left Shoulder Flexion  3/5    Left Shoulder ABduction  3-/5    Left Shoulder Internal Rotation  3/5    Left Shoulder External Rotation  3/5    Right/Left Elbow  Right;Left    Right Elbow Flexion  3-/5    Right Elbow Extension  3+/5    Left Elbow Flexion  3-/5    Left Elbow Extension  3+/5    Right/Left Wrist  Right;Left    Right/Left hand  Right;Left             Objective measurements completed on examination: See above findings.      [facility] Adult PT Treatment/Exercise - 11/21/17 0001      Exercises   Exercises  Hand;Shoulder;Elbow      Shoulder  Exercises: Seated   Other Seated Exercises  bicep curls with yellow TB x15 reps each      Hand Exercises   Other Hand Exercises  BUE towel squeeze 10x5 sec hold     Other Hand Exercises  wrist supination and pronation with yellow TB x10 reps              PT Education - 11/21/17 1343    Education provided  Yes    Education Details  goals of therapy to establish pt independence with home programs; eval findings/POC; HEP initiated and reviewed    Person(s) Educated  Patient    Methods  Explanation;Handout    Comprehension  Verbalized understanding;Returned demonstration       PT Short Term Goals - 11/21/17 1143      PT SHORT TERM GOAL #1   Title  Pt will be independent with her initial HEP to improve shoulder ROM and UE strength.    Time  4    Period  Weeks    Status  New    Target Date  12/19/17        PT Long Term Goals - 11/21/17 1144      PT LONG TERM GOAL #1   Title  Pt will demo improved Rt shoulder elevation active ROM to atleast 90 deg which will assist with reaching overhead while fixing her hair in the mornings.    Time  8    Period  Weeks    Status  New    Target Date  01/16/18      PT LONG TERM GOAL #2   Title  Pt will demo improved shoulder strength to atleast 4/5 MMT which will increase her safety with lifting objects overhead into her kitchen cabinets.     Time  8    Period  Weeks    Status  New      PT LONG TERM GOAL #3   Title  Pt will be able to open water bottles without assistance, to reflect an increase in her hand/forearm strength.     Time  8    Period  Weeks    Status  New  PT LONG TERM GOAL #4   Title  Pts wrist/elbow strength will improve by atleast 1 MMT grade, to assist with daily chores and lifting small objects.    Time  8    Period  Weeks    Status  New      PT LONG TERM GOAL #5   Title  Pt will report improved UE function by atleast 50% which will allow her to fix her hair, open jars with less difficulty.    Time  8     Period  Weeks    Status  New             Plan - 11/21/17 1137    Clinical Impression Statement  Pt is a pleasant 84 y.o F, familiar to our clinic, who presents today with complaints of UE weakness limiting her ability to care for herself. She does demonstrates significant limitations in shoulder ROM with noted crepitus, in addition to weakness of the shoulder/elbow/wrist/hand. Grip strength on the Rt is greater than the left, but only reaches 14lb of force. She has demonstrated good HEP adherence in the past and was able to complete all HEP additions today without difficulty. She would benefit from skilled PT to address her limitations in ROM, strength, endurance and increase her independence with UE functional activity.    History and Personal Factors relevant to plan of care:  Rt shoulder surgery many years ago    Clinical Presentation  Stable    Clinical Presentation due to:  no worse/better    Clinical Decision Making  Low    Rehab Potential  Good    Clinical Impairments Affecting Rehab Potential  osteoporosis, R RTC repair in the past     PT Frequency  2x / week    PT Duration  8 weeks    PT Treatment/Interventions  ADLs/Self Care Home Management;Cryotherapy;Electrical Stimulation;Ultrasound;Moist Heat;Iontophoresis 4mg /ml Dexamethasone;Therapeutic activities;Therapeutic exercise;Patient/family education;Neuromuscular re-education;Manual techniques;Taping;Passive range of motion    PT Next Visit Plan  gentle shoulder ROM; hand/wrist/elbow/shoulder strengthening progressions; postural strengthening    PT Home Exercise Plan  wrist pronation/supination yellow TB, towel squeeze with hand, scap squeezes    Consulted and Agree with Plan of Care  Patient       Patient will benefit from skilled therapeutic intervention in order to improve the following deficits and impairments:  Impaired flexibility, Impaired UE functional use, Hypomobility, Decreased strength, Decreased range of motion,  Decreased endurance, Decreased activity tolerance, Postural dysfunction, Improper body mechanics, Decreased mobility  Visit Diagnosis: Abnormal posture - Plan: PT plan of care cert/re-cert  Muscle weakness (generalized) - Plan: PT plan of care cert/re-cert  Stiffness of right shoulder, not elsewhere classified - Plan: PT plan of care cert/re-cert  Stiffness of left shoulder, not elsewhere classified - Plan: PT plan of care cert/re-cert     Problem List Patient Active Problem List   Diagnosis Date Noted  . Epiretinal membrane, left eye 04/18/2017  . Acute blood loss anemia 09/27/2015  . Pelvic fracture (HCC) 09/21/2015  . Closed fracture of pelvis (HCC)   . Hypertensive retinopathy of both eyes 08/16/2015  . Stye external 02/23/2015  . Hypertension 01/28/2014  . Kyphoscoliosis 09/16/2013  . After cataract 02/19/2013  . Pseudophakia of both eyes 01/22/2013  . Posterior capsular opacification 10/29/2012  . Cystoid macular edema 04/30/2012  . Epiretinal membrane 04/30/2012  . MVP (mitral valve prolapse) 05/24/2011  . Hypercholesterolemia 05/24/2011    1:59 PM,11/21/17 Marylyn Ishihara PT, DPT [facility]  [phone]  [facility] [address] [place], [place], [address] Phone: [phone]   Fax:  [phone]  Name: Madison Odom MRN: [id number] Date of Birth: 02/02/1934

## 2017-11-22 ENCOUNTER — Encounter: Payer: Medicare Other | Admitting: Adult Health

## 2017-11-26 ENCOUNTER — Ambulatory Visit: Payer: Medicare Other | Admitting: Physical Therapy

## 2017-11-27 ENCOUNTER — Encounter: Payer: Medicare Other | Admitting: Adult Health

## 2017-11-28 ENCOUNTER — Encounter: Payer: Self-pay | Admitting: Physical Therapy

## 2017-11-28 ENCOUNTER — Ambulatory Visit: Payer: Medicare Other | Admitting: Physical Therapy

## 2017-11-28 DIAGNOSIS — M6281 Muscle weakness (generalized): Secondary | ICD-10-CM

## 2017-11-28 DIAGNOSIS — R293 Abnormal posture: Secondary | ICD-10-CM

## 2017-11-28 DIAGNOSIS — M25611 Stiffness of right shoulder, not elsewhere classified: Secondary | ICD-10-CM

## 2017-11-28 DIAGNOSIS — M25612 Stiffness of left shoulder, not elsewhere classified: Secondary | ICD-10-CM

## 2017-11-28 NOTE — Therapy (Signed)
[facility] [address], [address] [place], [place], [address] Phone: [phone]   Fax:  [phone]  Physical Therapy Treatment  Patient Details  Name: Madison Odom MRN: [id number] Date of Birth: [birth year] Referring Provider: [name], NP   Encounter Date: 11/28/2017  PT End of Session - 11/28/17 1226    Visit Number  2    Number of Visits  16    Authorization Type  [employer] Medicare     Authorization Time Period  11/21/17 to 01/16/18    PT Start Time  1148    PT Stop Time  1226    PT Time Calculation (min)  38 min    Activity Tolerance  Patient tolerated treatment well;No increased pain    Behavior During Therapy  WFL for tasks assessed/performed       Past Medical History:  Diagnosis Date  . GERD (gastroesophageal reflux disease)   . Hyperlipidemia   . MVP (mitral valve prolapse)   . OP (osteoporosis)   . Pelvis fracture (HCC) 09/2015   CLOSED   . Scoliosis   . Spastic colon     Past Surgical History:  Procedure Laterality Date  . ABDOMINAL HYSTERECTOMY  1965  . APPENDECTOMY    . BILATERAL OOPHORECTOMY  1981  . EYE SURGERY    . ROTATOR CUFF REPAIR Right     There were no vitals filed for this visit.  Subjective Assessment - 11/28/17 1151    Subjective  Pt reports that she tried to do her exercises, but she needs someone to go over them with her. She has no pain currently.     Pertinent History  LBP for several years especially standing in the kitchen. Most recent fall March 2018 picking up paper off of the kitchen floor. Felt like she couldn't walk around grocery store after the fall and then fell again onto daughter amb up curb from store. Denies vertigo. Uses the rollator at home. Lives alone. Has driver.     Limitations  Lifting;Other (comment) opening jars/water bottles     Currently in Pain?  No/denies                      [facility] Adult PT Treatment/Exercise - 11/28/17 0001      Exercises   Exercises  Wrist      Shoulder Exercises: Seated   Extension  10 reps;Both;Theraband;Other (comment) x2 sets     Theraband Level (Shoulder Extension)  Level 1 (Yellow)      Shoulder Exercises: Standing   Other Standing Exercises  shoulder flexion/horizontal abduction x10 reps with UE ranger L 16 (therapist providing scapular assist)      Shoulder Exercises: Pulleys   Flexion  2 minutes    ABduction  2 minutes      Hand Exercises   Other Hand Exercises  Velcro board Rt finger extension/flexion (small strip only) x3 reps       Wrist Exercises   Other wrist exercises  velcro board Rt wrist flexion/extension x7 reps; Rt wrist pronation/supination x2 reps (increased difficulty with pronation); Rt UE key turn x4 reps    Forearm Supination  15 reps;Seated;Both;Strengthening;Theraband    Theraband Level (Supination)  Level 1 (Yellow)    Forearm Pronation  Both;15 reps;Strengthening;Theraband    Theraband Level (Pronation)  Level 1 (Yellow)             PT Education - 11/28/17 1226    Education provided  Yes  Education Details  technique with therex     Person(s) Educated  Patient    Methods  Explanation    Comprehension  Verbalized understanding       PT Short Term Goals - 11/21/17 1143      PT SHORT TERM GOAL #1   Title  Pt will be independent with her initial HEP to improve shoulder ROM and UE strength.    Time  4    Period  Weeks    Status  New    Target Date  12/19/17        PT Long Term Goals - 11/21/17 1144      PT LONG TERM GOAL #1   Title  Pt will demo improved Rt shoulder elevation active ROM to atleast 90 deg which will assist with reaching overhead while fixing her hair in the mornings.    Time  8    Period  Weeks    Status  New    Target Date  01/16/18      PT LONG TERM GOAL #2   Title  Pt will demo improved shoulder strength to atleast 4/5 MMT which will increase her safety with lifting objects overhead into her kitchen cabinets.      Time  8    Period  Weeks    Status  New      PT LONG TERM GOAL #3   Title  Pt will be able to open water bottles without assistance, to reflect an increase in her hand/forearm strength.     Time  8    Period  Weeks    Status  New      PT LONG TERM GOAL #4   Title  Pts wrist/elbow strength will improve by atleast 1 MMT grade, to assist with daily chores and lifting small objects.    Time  8    Period  Weeks    Status  New      PT LONG TERM GOAL #5   Title  Pt will report improved UE function by atleast 50% which will allow her to fix her hair, open jars with less difficulty.    Time  8    Period  Weeks    Status  New            Plan - 11/28/17 1227    Clinical Impression Statement  Pt arrived today requesting review of her HEP following her evaluation one week ago. Therapist was able to review technique with therex to improve pt understanding. Completed wrist/hand therex to fatigue this session, pt requiring moderate cuing overall to improve her technique and understanding of these exercises. Continue to note shoulder girdle restrictions especially during UE ranger activity, therapist provided scapular assist mobilization. Ended without report of pain.    Rehab Potential  Good    Clinical Impairments Affecting Rehab Potential  osteoporosis, R RTC repair in the past     PT Frequency  2x / week    PT Duration  8 weeks    PT Treatment/Interventions  ADLs/Self Care Home Management;Cryotherapy;Electrical Stimulation;Ultrasound;Moist Heat;Iontophoresis 4mg /ml Dexamethasone;Therapeutic activities;Therapeutic exercise;Patient/family education;Neuromuscular re-education;Manual techniques;Taping;Passive range of motion    PT Next Visit Plan  gentle shoulder ROM; hand/wrist/elbow/shoulder strengthening progressions (velcro board standing); postural strengthening    PT Home Exercise Plan  wrist pronation/supination yellow TB, towel squeeze with hand, scap squeezes    Consulted and Agree with  Plan of Care  Patient       Patient will benefit from skilled  therapeutic intervention in order to improve the following deficits and impairments:  Impaired flexibility, Impaired UE functional use, Hypomobility, Decreased strength, Decreased range of motion, Decreased endurance, Decreased activity tolerance, Postural dysfunction, Improper body mechanics, Decreased mobility  Visit Diagnosis: Abnormal posture  Muscle weakness (generalized)  Stiffness of right shoulder, not elsewhere classified  Stiffness of left shoulder, not elsewhere classified     Problem List Patient Active Problem List   Diagnosis Date Noted  . Epiretinal membrane, left eye 04/18/2017  . Acute blood loss anemia 09/27/2015  . Pelvic fracture (HCC) 09/21/2015  . Closed fracture of pelvis (HCC)   . Hypertensive retinopathy of both eyes 08/16/2015  . Stye external 02/23/2015  . Hypertension 01/28/2014  . Kyphoscoliosis 09/16/2013  . After cataract 02/19/2013  . Pseudophakia of both eyes 01/22/2013  . Posterior capsular opacification 10/29/2012  . Cystoid macular edema 04/30/2012  . Epiretinal membrane 04/30/2012  . MVP (mitral valve prolapse) 05/24/2011  . Hypercholesterolemia 05/24/2011    12:31 PM,11/28/17 Donita BrooksSara Sophira Rumler PT, DPT [facility]  [phone]  [facility] [address], [address] [place], [place], [address] Phone: [phone]   Fax:  [phone]  Name: Madison Odom MRN: [id number] Date of Birth: November 15, 1933

## 2017-11-28 NOTE — Patient Instructions (Signed)
    Seated Posterior Pelvic Tilt with Thera Band  Start in a seated neutral position with a Thera Band looped under your feet and holding each of the ends in your hands. Squeeze your glutes and perform a posterior pelvic tilt(imagine trying to push your belly buttom back to your spine to flatten your back). Perform a bicep curl and hold. x15 reps, using yellow band    [facility] [address], [address] [place], [place] [address] Phone # [phone] Fax [phone]

## 2017-11-29 ENCOUNTER — Encounter: Payer: Self-pay | Admitting: Adult Health

## 2017-11-29 ENCOUNTER — Ambulatory Visit (INDEPENDENT_AMBULATORY_CARE_PROVIDER_SITE_OTHER): Payer: Medicare Other | Admitting: Adult Health

## 2017-11-29 VITALS — BP 130/70 | HR 66 | Temp 97.4°F | Ht <= 58 in | Wt 80.0 lb

## 2017-11-29 DIAGNOSIS — I10 Essential (primary) hypertension: Secondary | ICD-10-CM

## 2017-11-29 DIAGNOSIS — E7800 Pure hypercholesterolemia, unspecified: Secondary | ICD-10-CM

## 2017-11-29 DIAGNOSIS — Z0000 Encounter for general adult medical examination without abnormal findings: Secondary | ICD-10-CM | POA: Diagnosis not present

## 2017-11-29 LAB — BASIC METABOLIC PANEL
BUN: 27 mg/dL — ABNORMAL HIGH (ref 6–23)
CALCIUM: 9.8 mg/dL (ref 8.4–10.5)
CO2: 31 mEq/L (ref 19–32)
CREATININE: 0.51 mg/dL (ref 0.40–1.20)
Chloride: 102 mEq/L (ref 96–112)
GFR: 122.82 mL/min (ref >60.00)
Glucose, Bld: 97 mg/dL (ref 70–99)
Potassium: 4.2 mEq/L (ref 3.5–5.1)
Sodium: 141 mEq/L (ref 135–145)

## 2017-11-29 LAB — CBC WITH DIFFERENTIAL/PLATELET
BASOS ABS: 0 10*3/uL (ref 0.0–0.1)
Basophils Relative: 0.4 % (ref 0.0–3.0)
EOS ABS: 0.1 10*3/uL (ref 0.0–0.7)
Eosinophils Relative: 0.7 % (ref 0.0–5.0)
HEMATOCRIT: 38.3 % (ref 36.0–46.0)
HEMOGLOBIN: 11.9 g/dL — AB (ref 12.0–15.0)
LYMPHS PCT: 21.4 % (ref 12.0–46.0)
Lymphs Abs: 1.9 10*3/uL (ref 0.7–4.0)
MCHC: 31.2 g/dL (ref 30.0–36.0)
MCV: 79.1 fl (ref 78.0–100.0)
Monocytes Absolute: 0.9 10*3/uL (ref 0.1–1.0)
Monocytes Relative: 9.9 % (ref 3.0–12.0)
Neutro Abs: 6.1 10*3/uL (ref 1.4–7.7)
Neutrophils Relative %: 67.6 % (ref 43.0–77.0)
Platelets: 324 10*3/uL (ref 150.0–400.0)
RBC: 4.84 Mil/uL (ref 3.87–5.11)
RDW: 20.1 % — ABNORMAL HIGH (ref 11.5–15.5)
WBC: 9 10*3/uL (ref 4.0–10.5)

## 2017-11-29 LAB — LIPID PANEL
CHOL/HDL RATIO: 3
Cholesterol: 178 mg/dL (ref 0–200)
HDL: 69.8 mg/dL (ref >39.00)
LDL CALC: 90 mg/dL (ref 0–99)
NONHDL: 108.2
Triglycerides: 90 mg/dL (ref 0.0–149.0)
VLDL: 18 mg/dL (ref 0.0–40.0)

## 2017-11-29 LAB — TSH: TSH: 1.03 u[IU]/mL (ref 0.35–4.50)

## 2017-11-29 LAB — HEPATIC FUNCTION PANEL
ALK PHOS: 70 U/L (ref 39–117)
ALT: 14 U/L (ref 0–35)
AST: 21 U/L (ref 0–37)
Albumin: 4 g/dL (ref 3.5–5.2)
BILIRUBIN DIRECT: 0.1 mg/dL (ref 0.0–0.3)
BILIRUBIN TOTAL: 0.5 mg/dL (ref 0.2–1.2)
TOTAL PROTEIN: 6.3 g/dL (ref 6.0–8.3)

## 2017-11-29 NOTE — Progress Notes (Signed)
Subjective:    Patient ID: Madison Odom, female    DOB: 11-02-1934, 84 y.o.   MRN: [id number]  HPI  Patient presents for yearly preventative medicine examination. She is a pleasant 84 year old female who  has a past medical history of GERD (gastroesophageal reflux disease), Hyperlipidemia, MVP (mitral valve prolapse), OP (osteoporosis), Pelvis fracture (HCC) (09/2015), Scoliosis, and Spastic colon.  She takes lasix for hx of pulmonary hypertension  She takes Corgard for essential hypertension   She takes Simvastatin for hyperlipidemia.   All immunizations and health maintenance protocols were reviewed with the patient and needed orders were placed. She is UTD on vaccinations.   Appropriate screening laboratory values were ordered for the patient including screening of hyperlipidemia, renal function and hepatic function.  Medication reconciliation,  past medical history, social history, problem list and allergies were reviewed in detail with the patient  Goals were established with regard to weight loss, exercise, and  diet in compliance with medications.   End of life planning was discussed. She has an advanced directive and living will.   She sees Dr. Delton See with Cardiology twice a year.   She has had some functional decline over the last few years but she continues to live independently. She is not driving and will have to rely on family to driver her to appointments.   She continues to feel SOB with exertion she was prescribed Lasix by Dr. Delton See, but stopped taking them as she felt it was making her weak.   She has no acute complaints today   Review of Systems  Constitutional: Negative.   HENT: Negative.   Eyes: Negative.   Respiratory: Negative.   Cardiovascular: Negative.   Gastrointestinal: Negative.   Endocrine: Negative.   Genitourinary: Negative.   Musculoskeletal: Positive for arthralgias and gait problem.  Skin: Negative.   Allergic/Immunologic: Negative.     Hematological: Negative.   Psychiatric/Behavioral: Negative.    Past Medical History:  Diagnosis Date  . GERD (gastroesophageal reflux disease)   . Hyperlipidemia   . MVP (mitral valve prolapse)   . OP (osteoporosis)   . Pelvis fracture (HCC) 09/2015   CLOSED   . Scoliosis   . Spastic colon     Social History   Socioeconomic History  . Marital status: Widowed    Spouse name: Not on file  . Number of children: Not on file  . Years of education: Not on file  . Highest education level: Not on file  Social Needs  . Financial resource strain: Not on file  . Food insecurity - worry: Not on file  . Food insecurity - inability: Not on file  . Transportation needs - medical: Not on file  . Transportation needs - non-medical: Not on file  Occupational History  . Not on file  Tobacco Use  . Smoking status: Never Smoker  . Smokeless tobacco: Never Used  Substance and Sexual Activity  . Alcohol use: No  . Drug use: No  . Sexual activity: Not on file  Other Topics Concern  . Not on file  Social History Narrative   1 year of college    Widow   Two daughters     Past Surgical History:  Procedure Laterality Date  . ABDOMINAL HYSTERECTOMY  1965  . APPENDECTOMY    . BILATERAL OOPHORECTOMY  1981  . EYE SURGERY    . ROTATOR CUFF REPAIR Right     Family History  Problem Relation Age of Onset  .  Heart attack Mother   . Hypertension Mother   . Heart disease Father   . Hypertension Father   . Stroke Neg Hx     Allergies  Allergen Reactions  . Phenothiazines Other (See Comments) and Anaphylaxis    Unknown reaction  . Prochlorperazine Anaphylaxis  . Penicillin V     Other reaction(s): Other (See Comments) Yeast   . Penicillins     Yeast infections  . Tetracycline     Other reaction(s): Other (See Comments) Yeast infection  . Tetracyclines & Related Other (See Comments)    Unknown reaction    Current Outpatient Medications on File Prior to Visit  Medication Sig  Dispense Refill  . aspirin 81 MG EC tablet Take 81 mg by mouth daily.      . Calcium Carb-Cholecalciferol (CALCIUM 500 +D PO) Take 1 tablet by mouth daily.     . Multiple Vitamin (MULTIVITAMIN) tablet Take 1 tablet by mouth daily.    . nadolol (CORGARD) 40 MG tablet Take 1 tablet (40 mg total) by mouth daily. 90 tablet 3  . omeprazole (PRILOSEC) 20 MG capsule Take 1 capsule (20 mg total) by mouth daily. 30 capsule 11  . simvastatin (ZOCOR) 20 MG tablet Take 1 tablet (20 mg total) by mouth daily. 30 tablet 6  . FLUZONE HIGH-DOSE 0.5 ML injection TO BE ADMINISTERED BY PHARMACIST FOR IMMUNIZATION  0   No current facility-administered medications on file prior to visit.     BP 130/70 (BP Location: Left Arm, Patient Position: Sitting, Cuff Size: Normal)   Pulse 66   Temp (!) 97.4 F (36.3 C) (Oral)   Ht 4\' 10"  (1.473 m)   Wt 80 lb (36.3 kg)   SpO2 97%   BMI 16.72 kg/m       Objective:   Physical Exam  Constitutional: She is oriented to person, place, and time. She appears well-developed and well-nourished. No distress.  HENT:  Head: Normocephalic and atraumatic.  Right Ear: External ear normal.  Left Ear: External ear normal.  Nose: Nose normal.  Mouth/Throat: Oropharynx is clear and moist. No oropharyngeal exudate.  Eyes: Conjunctivae are normal. Pupils are equal, round, and reactive to light. Right eye exhibits no discharge. Left eye exhibits no discharge. No scleral icterus.  Neck: Normal range of motion. Neck supple. JVD present. No tracheal deviation present. No thyromegaly present.  Cardiovascular: Normal rate, regular rhythm, normal heart sounds and intact distal pulses. Exam reveals no gallop and no friction rub.  No murmur heard. Pulmonary/Chest: Effort normal and breath sounds normal. No stridor. No respiratory distress. She has no wheezes. She has no rales. She exhibits no tenderness.  Abdominal: Soft. Bowel sounds are normal. She exhibits no distension and no mass. There  is no tenderness. There is no rebound and no guarding.  Genitourinary:  Genitourinary Comments: Deferred   Musculoskeletal: Normal range of motion. She exhibits tenderness. She exhibits no edema or deformity.  Walks with rolling walker  Kyphosis  Lymphadenopathy:    She has no cervical adenopathy.  Neurological: She is alert and oriented to person, place, and time. She has normal reflexes. She displays normal reflexes. No cranial nerve deficit. She exhibits normal muscle tone. Coordination normal.  Skin: Skin is warm and dry. No rash noted. She is not diaphoretic. No erythema. No pallor.  Psychiatric: She has a normal mood and affect. Her behavior is normal. Judgment and thought content normal.  Nursing note and vitals reviewed.     Assessment & Plan:  1. Routine general medical examination at a health care facility - Encouraged to continue to stay active and eat a healthy diet.  - Reviewed fall precautions  - Follow up in one year or sooner if needed - Basic metabolic panel - CBC with Differential/Platelet - Hepatic function panel - Lipid panel - TSH  2. Hypercholesterolemia - Statin prescribed by cardiology  - Basic metabolic panel - CBC with Differential/Platelet - Hepatic function panel - Lipid panel - TSH  3. Essential hypertension - Well controlled.  - Basic metabolic panel - CBC with Differential/Platelet - Hepatic function panel - Lipid panel - TSH   [name], NP

## 2017-12-03 ENCOUNTER — Encounter: Payer: Self-pay | Admitting: Physical Therapy

## 2017-12-03 ENCOUNTER — Ambulatory Visit: Payer: Medicare Other | Admitting: Physical Therapy

## 2017-12-03 DIAGNOSIS — R293 Abnormal posture: Secondary | ICD-10-CM

## 2017-12-03 DIAGNOSIS — M25611 Stiffness of right shoulder, not elsewhere classified: Secondary | ICD-10-CM

## 2017-12-03 DIAGNOSIS — M25612 Stiffness of left shoulder, not elsewhere classified: Secondary | ICD-10-CM

## 2017-12-03 DIAGNOSIS — M6281 Muscle weakness (generalized): Secondary | ICD-10-CM

## 2017-12-03 NOTE — Therapy (Signed)
[facility] [address], [address] [place], [place], [address] Phone: [phone]   Fax:  [phone]  Physical Therapy Treatment  Patient Details  Name: Madison Odom MRN: [id number] Date of Birth: 1934/05/30 Referring Provider: [name], NP   Encounter Date: 12/03/2017  PT End of Session - 12/03/17 1311    Visit Number  3    Number of Visits  16    Authorization Type  [employer] Medicare     Authorization Time Period  11/21/17 to 01/16/18    PT Start Time  1231    PT Stop Time  1311    PT Time Calculation (min)  40 min    Activity Tolerance  Patient tolerated treatment well;No increased pain    Behavior During Therapy  WFL for tasks assessed/performed       Past Medical History:  Diagnosis Date  . GERD (gastroesophageal reflux disease)   . Hyperlipidemia   . MVP (mitral valve prolapse)   . OP (osteoporosis)   . Pelvis fracture (HCC) 09/2015   CLOSED   . Scoliosis   . Spastic colon     Past Surgical History:  Procedure Laterality Date  . ABDOMINAL HYSTERECTOMY  1965  . APPENDECTOMY    . BILATERAL OOPHORECTOMY  1981  . EYE SURGERY    . ROTATOR CUFF REPAIR Right     There were no vitals filed for this visit.  Subjective Assessment - 12/03/17 1315    Subjective  Pt reports tht she was just a little sore following her last session, but this only lasted for 1 day. No other comments or concerns.     Pertinent History  LBP for several years especially standing in the kitchen. Most recent fall March 2018 picking up paper off of the kitchen floor. Felt like she couldn't walk around grocery store after the fall and then fell again onto daughter amb up curb from store. Denies vertigo. Uses the rollator at home. Lives alone. Has driver.     Limitations  Lifting;Other (comment) opening jars/water bottles     Currently in Pain?  No/denies                      [facility] Adult PT Treatment/Exercise - 12/03/17 0001      Elbow Exercises   Elbow Flexion  Both;5 reps;Seated;Strengthening;Other (comment) 4# dumbbells       Shoulder Exercises: Seated   Horizontal ABduction  Right;Limitations;AAROM    Horizontal ABduction Limitations  UE ranger x3 min      Shoulder Exercises: Pulleys   Flexion  3 minutes RUE    ABduction  3 minutes RUE      Hand Exercises   Other Hand Exercises  Velcro board Rt and Lt finger extension/flexion (small strip only) x5 reps      Wrist Exercises   Wrist Flexion  Both;10 reps;Seated;Strengthening;Limitations    Wrist Flexion Limitations  2# dumbbells     Wrist Extension  Both;10 reps;Strengthening;Limitations    Wrist Extension Limitations  2# dumbbells     Wrist Radial Deviation  Both;10 reps;Seated;Strengthening;Limitations    Wrist Radial Deviation Limitations  2# dumbbells     Other wrist exercises  Rt and Lt wrist pronation/supination x4 reps (increased difficulty with pronation); Rt and Lt radial deviation x15 reps each; Rt UE key turn x6 reps             PT Education - 12/03/17 1315    Education provided  Yes    Education Details  limitations scoliosis/kyphosis will have on shoulder elevation ROM    Person(s) Educated  Patient    Methods  Explanation    Comprehension  Verbalized understanding       PT Short Term Goals - 12/03/17 1314      PT SHORT TERM GOAL #1   Title  Pt will be independent with her initial HEP to improve shoulder ROM and UE strength.    Time  4    Period  Weeks    Status  Achieved        PT Long Term Goals - 11/21/17 1144      PT LONG TERM GOAL #1   Title  Pt will demo improved Rt shoulder elevation active ROM to atleast 90 deg which will assist with reaching overhead while fixing her hair in the mornings.    Time  8    Period  Weeks    Status  New    Target Date  01/16/18      PT LONG TERM GOAL #2   Title  Pt will demo improved shoulder strength to atleast 4/5 MMT which will increase her safety with lifting objects  overhead into her kitchen cabinets.     Time  8    Period  Weeks    Status  New      PT LONG TERM GOAL #3   Title  Pt will be able to open water bottles without assistance, to reflect an increase in her hand/forearm strength.     Time  8    Period  Weeks    Status  New      PT LONG TERM GOAL #4   Title  Pts wrist/elbow strength will improve by atleast 1 MMT grade, to assist with daily chores and lifting small objects.    Time  8    Period  Weeks    Status  New      PT LONG TERM GOAL #5   Title  Pt will report improved UE function by atleast 50% which will allow her to fix her hair, open jars with less difficulty.    Time  8    Period  Weeks    Status  New            Plan - 12/03/17 1311    Clinical Impression Statement  Pt continues to make gradual progress towards her goals. Today's session she was able to complete 2-3 more repetitions with wrist/hand exercises on the velcro board. Continue to note weakness throughout the UEs and pt was only able to complete 5 reps with 4# bicep curls. Therapist educated pt on realistic expectations of shoulder mobility considering her severe scoliosis and kyphosis limiting scapular movement. Ended session without any report of pain, will continue with current POC.    Rehab Potential  Good    Clinical Impairments Affecting Rehab Potential  osteoporosis, R RTC repair in the past     PT Frequency  2x / week    PT Duration  8 weeks    PT Treatment/Interventions  ADLs/Self Care Home Management;Cryotherapy;Electrical Stimulation;Ultrasound;Moist Heat;Iontophoresis 4mg /ml Dexamethasone;Therapeutic activities;Therapeutic exercise;Patient/family education;Neuromuscular re-education;Manual techniques;Taping;Passive range of motion    PT Next Visit Plan  gentle shoulder ROM; supine AAROM with light weight; hand/wrist/elbow/shoulder strengthening progressions (velcro board standing); postural strengthening    PT Home Exercise Plan  wrist  pronation/supination yellow TB, towel squeeze with hand, scap squeezes    Consulted and Agree with Plan of  Care  Patient       Patient will benefit from skilled therapeutic intervention in order to improve the following deficits and impairments:  Impaired flexibility, Impaired UE functional use, Hypomobility, Decreased strength, Decreased range of motion, Decreased endurance, Decreased activity tolerance, Postural dysfunction, Improper body mechanics, Decreased mobility  Visit Diagnosis: Abnormal posture  Muscle weakness (generalized)  Stiffness of right shoulder, not elsewhere classified  Stiffness of left shoulder, not elsewhere classified     Problem List Patient Active Problem List   Diagnosis Date Noted  . Epiretinal membrane, left eye 04/18/2017  . Acute blood loss anemia 09/27/2015  . Pelvic fracture (HCC) 09/21/2015  . Closed fracture of pelvis (HCC)   . Hypertensive retinopathy of both eyes 08/16/2015  . Hypertension 01/28/2014  . Kyphoscoliosis 09/16/2013  . After cataract 02/19/2013  . Pseudophakia of both eyes 01/22/2013  . Posterior capsular opacification 10/29/2012  . Cystoid macular edema 04/30/2012  . Epiretinal membrane 04/30/2012  . MVP (mitral valve prolapse) 05/24/2011  . Hypercholesterolemia 05/24/2011    1:16 PM,12/03/17 Donita BrooksSara Kwabena Strutz PT, DPT [facility]  [phone]  [facility] [address], [address] [place], [place], [address] Phone: [phone]   Fax:  [phone]  Name: Madison Odom MRN: [id number] Date of Birth: 1934/08/07

## 2017-12-05 ENCOUNTER — Encounter: Payer: Self-pay | Admitting: Physical Therapy

## 2017-12-05 ENCOUNTER — Ambulatory Visit: Payer: Medicare Other | Attending: Adult Health | Admitting: Physical Therapy

## 2017-12-05 DIAGNOSIS — R293 Abnormal posture: Secondary | ICD-10-CM

## 2017-12-05 DIAGNOSIS — M25612 Stiffness of left shoulder, not elsewhere classified: Secondary | ICD-10-CM

## 2017-12-05 DIAGNOSIS — M25611 Stiffness of right shoulder, not elsewhere classified: Secondary | ICD-10-CM

## 2017-12-05 DIAGNOSIS — R2689 Other abnormalities of gait and mobility: Secondary | ICD-10-CM | POA: Diagnosis present

## 2017-12-05 DIAGNOSIS — M6281 Muscle weakness (generalized): Secondary | ICD-10-CM | POA: Diagnosis present

## 2017-12-05 DIAGNOSIS — G8929 Other chronic pain: Secondary | ICD-10-CM | POA: Insufficient documentation

## 2017-12-05 DIAGNOSIS — M545 Low back pain: Secondary | ICD-10-CM | POA: Insufficient documentation

## 2017-12-05 NOTE — Therapy (Signed)
[facility] [address], [address] [place], [place], [address] Phone: [phone]   Fax:  [phone]  Physical Therapy Treatment  Patient Details  Name: Madison Odom MRN: [id number] Date of Birth: 08/07/1934 Referring Provider: Shirline Freesory Nafziger, NP   Encounter Date: 12/05/2017  PT End of Session - 12/05/17 1135    Visit Number  4    Number of Visits  16    Authorization Type  [employer] Medicare     Authorization Time Period  11/21/17 to 01/16/18    PT Start Time  1135    PT Stop Time  1218    PT Time Calculation (min)  43 min    Activity Tolerance  Patient tolerated treatment well;No increased pain    Behavior During Therapy  WFL for tasks assessed/performed       Past Medical History:  Diagnosis Date  . GERD (gastroesophageal reflux disease)   . Hyperlipidemia   . MVP (mitral valve prolapse)   . OP (osteoporosis)   . Pelvis fracture (HCC) 09/2015   CLOSED   . Scoliosis   . Spastic colon     Past Surgical History:  Procedure Laterality Date  . ABDOMINAL HYSTERECTOMY  1965  . APPENDECTOMY    . BILATERAL OOPHORECTOMY  1981  . EYE SURGERY    . ROTATOR CUFF REPAIR Right     There were no vitals filed for this visit.  Subjective Assessment - 12/05/17 1138    Subjective  My shoulder feels so much better, and I can reach with it better.     Pertinent History  LBP for several years especially standing in the kitchen. Most recent fall March 2018 picking up paper off of the kitchen floor. Felt like she couldn't walk around grocery store after the fall and then fell again onto daughter amb up curb from store. Denies vertigo. Uses the rollator at home. Lives alone. Has driver.     Limitations  Lifting;Other (comment)    How long can you sit comfortably?  30 min    How long can you stand comfortably?  1 hour    How long can you walk comfortably?  30 min    Diagnostic tests  severe scoliosis; diagnostic testing March 2018    Patient Stated Goals  to be able to walk in the house without any help and use the cane outside    Currently in Pain?  Yes Muscles are just sore    Pain Score  4     Pain Location  Shoulder    Pain Orientation  Right    Pain Descriptors / Indicators  Sore    Aggravating Factors   Using it, stretching    Pain Relieving Factors  rest, heat, meds    Multiple Pain Sites  No                      [facility] Adult PT Treatment/Exercise - 12/05/17 0001      Shoulder Exercises: Seated   Horizontal ABduction Limitations  UE ranger fwd/bkdw 20x, then stirs 10x bil     External Rotation  -- Yellow band 10x, VC for posture    Flexion  AROM;Right 2x6    Other Seated Exercises  Biceps 2# 10x     Other Seated Exercises  Scap squeezes 10x      Shoulder Exercises: Standing   Flexion  -- Finger ladder 6x flexion, reached #10    Other Standing Exercises  Cones to first shelf 6x2      Shoulder Exercises: Pulleys   Flexion  3 minutes RUE    ABduction  3 minutes RUE      Hand Exercises   Digiticizer  Green 10x 2    Other Hand Exercises  Velcro board Rt and Lt finger extension/flexion (small strip only) x8 reps Added pronation/supination 6x on thin strip, key turns 6x       Wrist Exercises   Wrist Flexion  Strengthening;Right;15 reps    Wrist Flexion Limitations  2#    Wrist Extension  Strengthening;Right;15 reps    Wrist Extension Limitations  2#               PT Short Term Goals - 12/03/17 1314      PT SHORT TERM GOAL #1   Title  Pt will be independent with her initial HEP to improve shoulder ROM and UE strength.    Time  4    Period  Weeks    Status  Achieved        PT Long Term Goals - 11/21/17 1144      PT LONG TERM GOAL #1   Title  Pt will demo improved Rt shoulder elevation active ROM to atleast 90 deg which will assist with reaching overhead while fixing her hair in the mornings.    Time  8    Period  Weeks    Status  New    Target Date  01/16/18      PT  LONG TERM GOAL #2   Title  Pt will demo improved shoulder strength to atleast 4/5 MMT which will increase her safety with lifting objects overhead into her kitchen cabinets.     Time  8    Period  Weeks    Status  New      PT LONG TERM GOAL #3   Title  Pt will be able to open water bottles without assistance, to reflect an increase in her hand/forearm strength.     Time  8    Period  Weeks    Status  New      PT LONG TERM GOAL #4   Title  Pts wrist/elbow strength will improve by atleast 1 MMT grade, to assist with daily chores and lifting small objects.    Time  8    Period  Weeks    Status  New      PT LONG TERM GOAL #5   Title  Pt will report improved UE function by atleast 50% which will allow her to fix her hair, open jars with less difficulty.    Time  8    Period  Weeks    Status  New            Plan - 12/05/17 1135    Clinical Impression Statement  Pt presented today with reported muscle soreness. This was acceptable soreness to pt as she is very pleased with the new use of her arm/shoulder. Just yesterday she was able to make meatballs and then package them up and put them into the freezer. She was able to perform more reps/sets today  to focus on her functional activity tolerance. She was also able to put cones up the first shelf  today. This was a challenging range for her. Pain was not limiting during this treatment.     Rehab Potential  Good    Clinical Impairments Affecting Rehab Potential  osteoporosis, R RTC repair in  the past     PT Frequency  2x / week    PT Duration  8 weeks    PT Treatment/Interventions  ADLs/Self Care Home Management;Cryotherapy;Electrical Stimulation;Ultrasound;Moist Heat;Iontophoresis 4mg /ml Dexamethasone;Therapeutic activities;Therapeutic exercise;Patient/family education;Neuromuscular re-education;Manual techniques;Taping;Passive range of motion    PT Next Visit Plan  gentle shoulder ROM; supine AAROM with light weight;  hand/wrist/elbow/shoulder strengthening progressions (velcro board standing); postural strengthening    Consulted and Agree with Plan of Care  --       Patient will benefit from skilled therapeutic intervention in order to improve the following deficits and impairments:  Impaired flexibility, Impaired UE functional use, Hypomobility, Decreased strength, Decreased range of motion, Decreased endurance, Decreased activity tolerance, Postural dysfunction, Improper body mechanics, Decreased mobility  Visit Diagnosis: Abnormal posture  Muscle weakness (generalized)  Stiffness of right shoulder, not elsewhere classified  Stiffness of left shoulder, not elsewhere classified  Other abnormalities of gait and mobility  Chronic midline low back pain without sciatica     Problem List Patient Active Problem List   Diagnosis Date Noted  . Epiretinal membrane, left eye 04/18/2017  . Acute blood loss anemia 09/27/2015  . Pelvic fracture (HCC) 09/21/2015  . Closed fracture of pelvis (HCC)   . Hypertensive retinopathy of both eyes 08/16/2015  . Hypertension 01/28/2014  . Kyphoscoliosis 09/16/2013  . After cataract 02/19/2013  . Pseudophakia of both eyes 01/22/2013  . Posterior capsular opacification 10/29/2012  . Cystoid macular edema 04/30/2012  . Epiretinal membrane 04/30/2012  . MVP (mitral valve prolapse) 05/24/2011  . Hypercholesterolemia 05/24/2011    Orvie Caradine, PTA 12/05/2017, 12:21 PM  [facility] [address] [place], [place], [address] Phone: [phone]   Fax:  [phone]  Name: Madison Odom MRN: [id number] Date of Birth: 1934-10-07

## 2017-12-10 ENCOUNTER — Ambulatory Visit: Payer: Medicare Other | Admitting: Physical Therapy

## 2017-12-10 ENCOUNTER — Encounter: Payer: Self-pay | Admitting: Physical Therapy

## 2017-12-10 DIAGNOSIS — G8929 Other chronic pain: Secondary | ICD-10-CM

## 2017-12-10 DIAGNOSIS — M6281 Muscle weakness (generalized): Secondary | ICD-10-CM

## 2017-12-10 DIAGNOSIS — M25611 Stiffness of right shoulder, not elsewhere classified: Secondary | ICD-10-CM

## 2017-12-10 DIAGNOSIS — R293 Abnormal posture: Secondary | ICD-10-CM

## 2017-12-10 DIAGNOSIS — M545 Low back pain: Secondary | ICD-10-CM

## 2017-12-10 DIAGNOSIS — R2689 Other abnormalities of gait and mobility: Secondary | ICD-10-CM

## 2017-12-10 DIAGNOSIS — M25612 Stiffness of left shoulder, not elsewhere classified: Secondary | ICD-10-CM

## 2017-12-10 NOTE — Therapy (Signed)
[facility] [address], [address] [place], [place], [address] Phone: [phone]   Fax:  [phone]  Physical Therapy Treatment  Patient Details  Name: Madison Odom MRN: [id number] Date of Birth: [birth year] Referring Provider: Shirline Freesory Nafziger, NP   Encounter Date: 12/10/2017  PT End of Session - 12/10/17 1237    Visit Number  5    Number of Visits  16    Date for PT Re-Evaluation  10/17/17    Authorization Type  [employer] Medicare     Authorization Time Period  11/21/17 to 01/16/18    PT Start Time  1234    PT Stop Time  1305    PT Time Calculation (min)  31 min    Activity Tolerance  Patient tolerated treatment well;No increased pain    Behavior During Therapy  WFL for tasks assessed/performed       Past Medical History:  Diagnosis Date  . GERD (gastroesophageal reflux disease)   . Hyperlipidemia   . MVP (mitral valve prolapse)   . OP (osteoporosis)   . Pelvis fracture (HCC) 09/2015   CLOSED   . Scoliosis   . Spastic colon     Past Surgical History:  Procedure Laterality Date  . ABDOMINAL HYSTERECTOMY  1965  . APPENDECTOMY    . BILATERAL OOPHORECTOMY  1981  . EYE SURGERY    . ROTATOR CUFF REPAIR Right     There were no vitals filed for this visit.  Subjective Assessment - 12/10/17 1239    Subjective  I did too much last session, my RT arm was really painful. Took Tylenol and this helped. This AM it is not hurting but sore.     Pertinent History  LBP for several years especially standing in the kitchen. Most recent fall March 2018 picking up paper off of the kitchen floor. Felt like she couldn't walk around grocery store after the fall and then fell again onto daughter amb up curb from store. Denies vertigo. Uses the rollator at home. Lives alone. Has driver.     Diagnostic tests  severe scoliosis; diagnostic testing March 2018    Patient Stated Goals  to be able to walk in the house without any help and use the cane  outside    Currently in Pain?  Yes    Pain Score  4     Pain Location  Arm    Pain Orientation  Right    Pain Descriptors / Indicators  Sore    Aggravating Factors   Overdoing it    Pain Relieving Factors  meds, heat, rest    Multiple Pain Sites  No                      [facility] Adult PT Treatment/Exercise - 12/10/17 0001      Shoulder Exercises: Seated   Horizontal ABduction Limitations  UE ranger: fwd/bkwd/10x, then circles 10x each way    Flexion  AROM;Strengthening;Right 6x    Other Seated Exercises  Biceps 2# 10x     Other Seated Exercises  Scap squeezes 10x      Shoulder Exercises: Pulleys   Flexion  3 minutes RUE    ABduction  3 minutes RUE      Hand Exercises   Other Hand Exercises  Velcro board Rt and Lt finger extension/flexion (small strip only) x6 reps Added pronation/supination 6x on thin strip, key turns 6x       Wrist Exercises  Wrist Flexion  Strengthening;Right;15 reps    Wrist Flexion Limitations  2#    Wrist Extension  Strengthening;Right;15 reps    Wrist Extension Limitations  2#               PT Short Term Goals - 12/03/17 1314      PT SHORT TERM GOAL #1   Title  Pt will be independent with her initial HEP to improve shoulder ROM and UE strength.    Time  4    Period  Weeks    Status  Achieved        PT Long Term Goals - 11/21/17 1144      PT LONG TERM GOAL #1   Title  Pt will demo improved Rt shoulder elevation active ROM to atleast 90 deg which will assist with reaching overhead while fixing her hair in the mornings.    Time  8    Period  Weeks    Status  New    Target Date  01/16/18      PT LONG TERM GOAL #2   Title  Pt will demo improved shoulder strength to atleast 4/5 MMT which will increase her safety with lifting objects overhead into her kitchen cabinets.     Time  8    Period  Weeks    Status  New      PT LONG TERM GOAL #3   Title  Pt will be able to open water bottles without assistance, to reflect an  increase in her hand/forearm strength.     Time  8    Period  Weeks    Status  New      PT LONG TERM GOAL #4   Title  Pts wrist/elbow strength will improve by atleast 1 MMT grade, to assist with daily chores and lifting small objects.    Time  8    Period  Weeks    Status  New      PT LONG TERM GOAL #5   Title  Pt will report improved UE function by atleast 50% which will allow her to fix her hair, open jars with less difficulty.    Time  8    Period  Weeks    Status  New            Plan - 12/10/17 1238    Clinical Impression Statement  Pt reports her RT arm was very painful after last session. After discussion it appears the exercises where she lifts the arm were more than likely the culprit. Since she was still some sore today we eliminated the cones to the shelf exercise but kept just AROM exercise in the routine. All other exercises were kept the same and not progressed. One thing to note is that pt did report she using her RTLE to turn light switches on/off more than she used to.      Rehab Potential  Good    Clinical Impairments Affecting Rehab Potential  osteoporosis, R RTC repair in the past     PT Frequency  2x / week    PT Duration  8 weeks    PT Treatment/Interventions  ADLs/Self Care Home Management;Cryotherapy;Electrical Stimulation;Ultrasound;Moist Heat;Iontophoresis 4mg /ml Dexamethasone;Therapeutic activities;Therapeutic exercise;Patient/family education;Neuromuscular re-education;Manual techniques;Taping;Passive range of motion    PT Next Visit Plan  gentle shoulder ROM; supine AAROM with light weight; hand/wrist/elbow/shoulder strengthening progressions (velcro board standing); postural strengthening    Consulted and Agree with Plan of Care  Patient  Patient will benefit from skilled therapeutic intervention in order to improve the following deficits and impairments:  Impaired flexibility, Impaired UE functional use, Hypomobility, Decreased strength,  Decreased range of motion, Decreased endurance, Decreased activity tolerance, Postural dysfunction, Improper body mechanics, Decreased mobility  Visit Diagnosis: Abnormal posture  Muscle weakness (generalized)  Stiffness of right shoulder, not elsewhere classified  Stiffness of left shoulder, not elsewhere classified  Other abnormalities of gait and mobility  Chronic midline low back pain without sciatica     Problem List Patient Active Problem List   Diagnosis Date Noted  . Epiretinal membrane, left eye 04/18/2017  . Acute blood loss anemia 09/27/2015  . Pelvic fracture (HCC) 09/21/2015  . Closed fracture of pelvis (HCC)   . Hypertensive retinopathy of both eyes 08/16/2015  . Hypertension 01/28/2014  . Kyphoscoliosis 09/16/2013  . After cataract 02/19/2013  . Pseudophakia of both eyes 01/22/2013  . Posterior capsular opacification 10/29/2012  . Cystoid macular edema 04/30/2012  . Epiretinal membrane 04/30/2012  . MVP (mitral valve prolapse) 05/24/2011  . Hypercholesterolemia 05/24/2011    Samanta Gal, PTA 12/10/2017, 1:06 PM  [facility] [address], [address] [place], [place], [address] Phone: [phone]   Fax:  [phone]  Name: Madison Odom MRN: [id number] Date of Birth: 1934/06/15

## 2017-12-12 ENCOUNTER — Ambulatory Visit: Payer: Medicare Other | Admitting: Physical Therapy

## 2017-12-12 ENCOUNTER — Encounter: Payer: Self-pay | Admitting: Physical Therapy

## 2017-12-12 DIAGNOSIS — M25611 Stiffness of right shoulder, not elsewhere classified: Secondary | ICD-10-CM

## 2017-12-12 DIAGNOSIS — M545 Low back pain, unspecified: Secondary | ICD-10-CM

## 2017-12-12 DIAGNOSIS — R2689 Other abnormalities of gait and mobility: Secondary | ICD-10-CM

## 2017-12-12 DIAGNOSIS — G8929 Other chronic pain: Secondary | ICD-10-CM

## 2017-12-12 DIAGNOSIS — R293 Abnormal posture: Secondary | ICD-10-CM

## 2017-12-12 DIAGNOSIS — M25612 Stiffness of left shoulder, not elsewhere classified: Secondary | ICD-10-CM

## 2017-12-12 DIAGNOSIS — M6281 Muscle weakness (generalized): Secondary | ICD-10-CM

## 2017-12-12 NOTE — Therapy (Signed)
[facility] [address] [place], [place], [address] Phone: [phone]   Fax:  [phone]  Physical Therapy Treatment  Patient Details  Name: Madison Odom MRN: [id number] Date of Birth: 08-14-1934 Referring Provider: [name], NP   Encounter Date: 12/12/2017  PT End of Session - 12/12/17 1146    Visit Number  6    Number of Visits  16    Authorization Type  [employer] Medicare     Authorization Time Period  11/21/17 to 01/16/18    PT Start Time  1142    PT Stop Time  1215    PT Time Calculation (min)  33 min    Activity Tolerance  Patient tolerated treatment well;No increased pain    Behavior During Therapy  WFL for tasks assessed/performed       Past Medical History:  Diagnosis Date  . GERD (gastroesophageal reflux disease)   . Hyperlipidemia   . MVP (mitral valve prolapse)   . OP (osteoporosis)   . Pelvis fracture (HCC) 09/2015   CLOSED   . Scoliosis   . Spastic colon     Past Surgical History:  Procedure Laterality Date  . ABDOMINAL HYSTERECTOMY  1965  . APPENDECTOMY    . BILATERAL OOPHORECTOMY  1981  . EYE SURGERY    . ROTATOR CUFF REPAIR Right     There were no vitals filed for this visit.  Subjective Assessment - 12/12/17 1148    Subjective  I am not as sore as I was last session.     Pertinent History  LBP for several years especially standing in the kitchen. Most recent fall March 2018 picking up paper off of the kitchen floor. Felt like she couldn't walk around grocery store after the fall and then fell again onto daughter amb up curb from store. Denies vertigo. Uses the rollator at home. Lives alone. Has driver.     Currently in Pain?  Yes    Pain Score  2     Pain Location  Shoulder    Pain Orientation  Right    Pain Descriptors / Indicators  Dull    Multiple Pain Sites  No         [facility] PT Assessment - 12/12/17 0001      AROM   Overall AROM Comments  Rt shoulder flexion 70 degrees                   [facility] Adult PT Treatment/Exercise - 12/12/17 0001      Shoulder Exercises: Seated   Horizontal ABduction Limitations  UE ranger: fwd/bkwd/15x, then circles 15x each way    Flexion  AROM;Strengthening;Right 6x: touch top of head 5x    Other Seated Exercises  Biceps 2# 10x 2    Other Seated Exercises  Scap squeezes 10x2      Shoulder Exercises: Pulleys   Flexion  3 minutes RUE    ABduction  3 minutes RUE      Hand Exercises   Digiticizer  Green 15x 2    Other Hand Exercises  Velcro board Rt and Lt finger extension/flexion (small strip only) x6 reps Added pronation/supination 6x on thin strip, key turns 6x       Wrist Exercises   Wrist Flexion  Strengthening;Right;15 reps    Wrist Flexion Limitations  2#    Wrist Extension  Strengthening;Right;15 reps    Wrist Extension Limitations  2#  PT Short Term Goals - 12/03/17 1314      PT SHORT TERM GOAL #1   Title  Pt will be independent with her initial HEP to improve shoulder ROM and UE strength.    Time  4    Period  Weeks    Status  Achieved        PT Long Term Goals - 12/12/17 1206      PT LONG TERM GOAL #3   Title  Pt will be able to open water bottles without assistance, to reflect an increase in her hand/forearm strength.     Time  8    Period  Weeks    Status  On-going Cannot do      PT LONG TERM GOAL #5   Title  Pt will report improved UE function by atleast 50% which will allow her to fix her hair, open jars with less difficulty.    Time  8    Period  Weeks    Status  On-going No easier            Plan - 12/12/17 1147    Clinical Impression Statement  Pt's soreness/pain is much improved since last session. She feels lifting arm up to the first shelf was what exacerbated her pain. Increased her overall workload without increasing the weight so simulate doing more ADLs. No pain with her exercises today. Opening water bottle and doing hair are no easier at this  point.      Rehab Potential  Good    Clinical Impairments Affecting Rehab Potential  osteoporosis, R RTC repair in the past     PT Frequency  2x / week    PT Duration  8 weeks    PT Treatment/Interventions  ADLs/Self Care Home Management;Cryotherapy;Electrical Stimulation;Ultrasound;Moist Heat;Iontophoresis 4mg /ml Dexamethasone;Therapeutic activities;Therapeutic exercise;Patient/family education;Neuromuscular re-education;Manual techniques;Taping;Passive range of motion    PT Next Visit Plan  gentle shoulder ROM; supine AAROM with light weight; hand/wrist/elbow/shoulder strengthening progressions (velcro board standing); postural strengthening    Consulted and Agree with Plan of Care  Patient       Patient will benefit from skilled therapeutic intervention in order to improve the following deficits and impairments:  Impaired flexibility, Impaired UE functional use, Hypomobility, Decreased strength, Decreased range of motion, Decreased endurance, Decreased activity tolerance, Postural dysfunction, Improper body mechanics, Decreased mobility  Visit Diagnosis: Abnormal posture  Muscle weakness (generalized)  Stiffness of right shoulder, not elsewhere classified  Stiffness of left shoulder, not elsewhere classified  Other abnormalities of gait and mobility  Chronic midline low back pain without sciatica     Problem List Patient Active Problem List   Diagnosis Date Noted  . Epiretinal membrane, left eye 04/18/2017  . Acute blood loss anemia 09/27/2015  . Pelvic fracture (HCC) 09/21/2015  . Closed fracture of pelvis (HCC)   . Hypertensive retinopathy of both eyes 08/16/2015  . Hypertension 01/28/2014  . Kyphoscoliosis 09/16/2013  . After cataract 02/19/2013  . Pseudophakia of both eyes 01/22/2013  . Posterior capsular opacification 10/29/2012  . Cystoid macular edema 04/30/2012  . Epiretinal membrane 04/30/2012  . MVP (mitral valve prolapse) 05/24/2011  . Hypercholesterolemia  05/24/2011    COCHRAN,JENNIFER, PTA 12/12/2017, 12:23 PM  [facility] [address], [address] [place], [place], [address] Phone: [phone]   Fax:  [phone]  Name: Cheryl Flashatsy A Mobley MRN: [id number] Date of Birth: March 03, 1934

## 2017-12-13 ENCOUNTER — Ambulatory Visit: Payer: Medicare Other | Admitting: Cardiology

## 2017-12-17 ENCOUNTER — Encounter: Payer: Self-pay | Admitting: Physical Therapy

## 2017-12-17 ENCOUNTER — Ambulatory Visit: Payer: Medicare Other | Attending: Adult Health | Admitting: Physical Therapy

## 2017-12-17 DIAGNOSIS — G8929 Other chronic pain: Secondary | ICD-10-CM | POA: Insufficient documentation

## 2017-12-17 DIAGNOSIS — M545 Low back pain: Secondary | ICD-10-CM | POA: Insufficient documentation

## 2017-12-17 DIAGNOSIS — R293 Abnormal posture: Secondary | ICD-10-CM | POA: Diagnosis present

## 2017-12-17 DIAGNOSIS — R2689 Other abnormalities of gait and mobility: Secondary | ICD-10-CM | POA: Insufficient documentation

## 2017-12-17 DIAGNOSIS — M25611 Stiffness of right shoulder, not elsewhere classified: Secondary | ICD-10-CM | POA: Insufficient documentation

## 2017-12-17 DIAGNOSIS — M25612 Stiffness of left shoulder, not elsewhere classified: Secondary | ICD-10-CM | POA: Diagnosis present

## 2017-12-17 DIAGNOSIS — M6281 Muscle weakness (generalized): Secondary | ICD-10-CM

## 2017-12-17 NOTE — Patient Instructions (Signed)
Strengthening: Isometric Flexion  Using wall for resistance, press right fist into ball using light pressure. Hold __5-8__ seconds. Repeat __5-8__ times per set. Do __1__ sets per session. Do __1-2__ sessions per day.    Extension (Isometric)  Place left bent elbow and back of arm against wall. Press elbow against wall. Hold _5-8__ seconds. Repeat __5-8__ times. Do _1-2___ sessions per day.  Internal Rotation (Isometric)  Place palm of right fist against door frame, with elbow bent. Press fist against door frame. Hold __5-8__ seconds. Repeat _5-8___ times. Do _1-2___ sessions per day.  External Rotation (Isometric)  Place back of left fist against door frame, with elbow bent. Press fist against door frame. Hold _5-8___ seconds. Repeat _5-8___ times. Do __1-2__ sessions per day.  Copyright  VHI. All rights reserved.

## 2017-12-17 NOTE — Therapy (Signed)
[facility] [address] [place], [place], [address] Phone: [phone]   Fax:  [phone]  Physical Therapy Treatment  Patient Details  Name: Madison Odom MRN: [id number] Date of Birth: 04/12/1934 Referring Provider: [name], NP   Encounter Date: 12/17/2017  PT End of Session - 12/17/17 1137    Visit Number  7    Date for PT Re-Evaluation  10/17/17    Authorization Type  [employer] Medicare     Authorization Time Period  11/21/17 to 01/16/18    PT Start Time  1137    PT Stop Time  1220    PT Time Calculation (min)  43 min    Activity Tolerance  Patient tolerated treatment well;No increased pain    Behavior During Therapy  WFL for tasks assessed/performed       Past Medical History:  Diagnosis Date  . GERD (gastroesophageal reflux disease)   . Hyperlipidemia   . MVP (mitral valve prolapse)   . OP (osteoporosis)   . Pelvis fracture (HCC) 09/2015   CLOSED   . Scoliosis   . Spastic colon     Past Surgical History:  Procedure Laterality Date  . ABDOMINAL HYSTERECTOMY  1965  . APPENDECTOMY    . BILATERAL OOPHORECTOMY  1981  . EYE SURGERY    . ROTATOR CUFF REPAIR Right     There were no vitals filed for this visit.  Subjective Assessment - 12/17/17 1141    Subjective  I think I overdid my exercises over the weekend because I got really pretty sore in my shoulder. I bought some silly putty to do my finger and wrist exercises.     Pertinent History  LBP for several years especially standing in the kitchen. Most recent fall March 2018 picking up paper off of the kitchen floor. Felt like she couldn't walk around grocery store after the fall and then fell again onto daughter amb up curb from store. Denies vertigo. Uses the rollator at home. Lives alone. Has driver.     Limitations  Lifting;Other (comment)    Currently in Pain?  Yes    Pain Score  4     Pain Location  Shoulder    Pain Orientation  Right    Pain  Descriptors / Indicators  Sore    Aggravating Factors   Overdoing it    Pain Relieving Factors  heat, ice, rest    Multiple Pain Sites  No         [facility] PT Assessment - 12/17/17 0001      Strength   Right Shoulder Flexion  4/5    Right Elbow Flexion  4/5    Left Elbow Flexion  4/5                  [facility] Adult PT Treatment/Exercise - 12/17/17 0001      Shoulder Exercises: Seated   Horizontal ABduction Limitations  UE ranger: fwd/bkwd/15x, then circles 15x each way    Other Seated Exercises  Biceps 2# 2x15      Shoulder Exercises: Pulleys   Flexion  3 minutes    ABduction  3 minutes      Shoulder Exercises: Isometric Strengthening   Flexion  5X5"    Extension  5X5"    External Rotation  5X5"    Internal Rotation  5X5"      Hand Exercises   Digiticizer  Green 20x 2      Wrist Exercises  Wrist Flexion  Strengthening;Right;15 reps 2x15    Wrist Flexion Limitations  2#    Wrist Extension  Strengthening;Right;15 reps    Wrist Extension Limitations  2#             PT Education - 12/17/17 1153    Education provided  Yes    Education Details  Isometrics for HEP in addition to theraputty exercises to do at home.     Person(s) Educated  Patient    Methods  Explanation;Demonstration;Tactile cues;Verbal cues;Handout    Comprehension  Verbalized understanding;Returned demonstration       PT Short Term Goals - 12/03/17 1314      PT SHORT TERM GOAL #1   Title  Pt will be independent with her initial HEP to improve shoulder ROM and UE strength.    Time  4    Period  Weeks    Status  Achieved        PT Long Term Goals - 12/12/17 1206      PT LONG TERM GOAL #3   Title  Pt will be able to open water bottles without assistance, to reflect an increase in her hand/forearm strength.     Time  8    Period  Weeks    Status  On-going Cannot do      PT LONG TERM GOAL #5   Title  Pt will report improved UE function by atleast 50% which will allow her to fix  her hair, open jars with less difficulty.    Time  8    Period  Weeks    Status  On-going No easier            Plan - 12/17/17 1221    Clinical Impression Statement  Pt is now doing hand and grip exercises at home with putty. She may bring the putty in next session to demonstrate although we went over some exercises verbally in todays session. Pt MMT improved in her biceps and shoulder flexion by a grade, partially meeting her long term goal. Pt was able again to increase her workload by adding reps without adding weight. Pt was also instructed in shoulder isometrics to give her more tools for shoulder strengthening at home and not aggrevating the shoulder with more advanced movements/strength that have typically increased her pain.      Rehab Potential  Good    Clinical Impairments Affecting Rehab Potential  osteoporosis, R RTC repair in the past     PT Frequency  2x / week    PT Duration  8 weeks    PT Treatment/Interventions  ADLs/Self Care Home Management;Cryotherapy;Electrical Stimulation;Ultrasound;Moist Heat;Iontophoresis 4mg /ml Dexamethasone;Therapeutic activities;Therapeutic exercise;Patient/family education;Neuromuscular re-education;Manual techniques;Taping;Passive range of motion    PT Next Visit Plan  Review isometrics and go over putty exercises if pt brings it in.     Consulted and Agree with Plan of Care  Patient       Patient will benefit from skilled therapeutic intervention in order to improve the following deficits and impairments:  Impaired flexibility, Impaired UE functional use, Hypomobility, Decreased strength, Decreased range of motion, Decreased endurance, Decreased activity tolerance, Postural dysfunction, Improper body mechanics, Decreased mobility  Visit Diagnosis: Abnormal posture  Muscle weakness (generalized)  Stiffness of right shoulder, not elsewhere classified  Stiffness of left shoulder, not elsewhere classified     Problem List Patient  Active Problem List   Diagnosis Date Noted  . Epiretinal membrane, left eye 04/18/2017  . Acute blood loss anemia 09/27/2015  .  Pelvic fracture (HCC) 09/21/2015  . Closed fracture of pelvis (HCC)   . Hypertensive retinopathy of both eyes 08/16/2015  . Hypertension 01/28/2014  . Kyphoscoliosis 09/16/2013  . After cataract 02/19/2013  . Pseudophakia of both eyes 01/22/2013  . Posterior capsular opacification 10/29/2012  . Cystoid macular edema 04/30/2012  . Epiretinal membrane 04/30/2012  . MVP (mitral valve prolapse) 05/24/2011  . Hypercholesterolemia 05/24/2011    Lochlan Grygiel, PTA 12/17/2017, 12:29 PM  [facility] [address] [place], [place], [address] Phone: [phone]   Fax:  [phone]  Name: Madison Odom MRN: [id number] Date of Birth: Jan 21, 1934

## 2017-12-19 ENCOUNTER — Ambulatory Visit: Payer: Medicare Other | Admitting: Physical Therapy

## 2017-12-19 ENCOUNTER — Encounter: Payer: Self-pay | Admitting: Physical Therapy

## 2017-12-19 DIAGNOSIS — R2689 Other abnormalities of gait and mobility: Secondary | ICD-10-CM

## 2017-12-19 DIAGNOSIS — R293 Abnormal posture: Secondary | ICD-10-CM | POA: Diagnosis not present

## 2017-12-19 DIAGNOSIS — M25612 Stiffness of left shoulder, not elsewhere classified: Secondary | ICD-10-CM

## 2017-12-19 DIAGNOSIS — M25611 Stiffness of right shoulder, not elsewhere classified: Secondary | ICD-10-CM

## 2017-12-19 DIAGNOSIS — M6281 Muscle weakness (generalized): Secondary | ICD-10-CM

## 2017-12-19 NOTE — Therapy (Signed)
[facility] [address] [place], [place], [address] Phone: [phone]   Fax:  [phone]  Physical Therapy Treatment  Patient Details  Name: Madison Odom MRN: [id number] Date of Birth: Mar 27, 1934 Referring Provider: [name], NP   Encounter Date: 12/19/2017  PT End of Session - 12/19/17 1152    Visit Number  8    Number of Visits  16    Authorization Time Period  11/21/17 to 01/16/18    PT Start Time  1146    PT Stop Time  1230    PT Time Calculation (min)  44 min    Activity Tolerance  Patient tolerated treatment well;No increased pain    Behavior During Therapy  WFL for tasks assessed/performed       Past Medical History:  Diagnosis Date  . GERD (gastroesophageal reflux disease)   . Hyperlipidemia   . MVP (mitral valve prolapse)   . OP (osteoporosis)   . Pelvis fracture (HCC) 09/2015   CLOSED   . Scoliosis   . Spastic colon     Past Surgical History:  Procedure Laterality Date  . ABDOMINAL HYSTERECTOMY  1965  . APPENDECTOMY    . BILATERAL OOPHORECTOMY  1981  . EYE SURGERY    . ROTATOR CUFF REPAIR Right     There were no vitals filed for this visit.  Subjective Assessment - 12/19/17 1153    Subjective  My shoulder is sore in the front. I need to review my new exercises.    Pertinent History  LBP for several years especially standing in the kitchen. Most recent fall March 2018 picking up paper off of the kitchen floor. Felt like she couldn't walk around grocery store after the fall and then fell again onto daughter amb up curb from store. Denies vertigo. Uses the rollator at home. Lives alone. Has driver.     Currently in Pain?  Yes    Pain Score  4     Pain Location  Shoulder    Pain Orientation  Right    Pain Descriptors / Indicators  Sore    Multiple Pain Sites  No         [facility] PT Assessment - 12/19/17 0001      AROM   Overall AROM Comments  Rt shoulder flexion: 80 degrees                    [facility] Adult PT Treatment/Exercise - 12/19/17 0001      Shoulder Exercises: Seated   Horizontal ABduction Limitations  UE ranger: fwd/bkwd/15x, then circles 15x each way    Flexion  -- Hands clasped together lifting front 10x    Other Seated Exercises  Biceps 2# 2x15    Other Seated Exercises  Scap squeezes 10x2      Shoulder Exercises: Pulleys   Flexion  3 minutes      Shoulder Exercises: Isometric Strengthening   Flexion  5X5"    Extension  5X5"    External Rotation  5X5"    Internal Rotation  5X5"      Hand Exercises   Other Hand Exercises  Putty repertoire for HEP rolling, flattening, pinching, gripping RT hand      Wrist Exercises   Wrist Flexion  Strengthening;Right;15 reps 2x15    Wrist Flexion Limitations  2#    Wrist Extension  Strengthening;Right;15 reps 2x 15    Wrist Extension Limitations  2#  PT Short Term Goals - 12/03/17 1314      PT SHORT TERM GOAL #1   Title  Pt will be independent with her initial HEP to improve shoulder ROM and UE strength.    Time  4    Period  Weeks    Status  Achieved        PT Long Term Goals - 12/12/17 1206      PT LONG TERM GOAL #3   Title  Pt will be able to open water bottles without assistance, to reflect an increase in her hand/forearm strength.     Time  8    Period  Weeks    Status  On-going Cannot do      PT LONG TERM GOAL #5   Title  Pt will report improved UE function by atleast 50% which will allow her to fix her hair, open jars with less difficulty.    Time  8    Period  Weeks    Status  On-going No easier            Plan - 12/19/17 1152    Clinical Impression Statement  Pt required review of her last HEP exercises in addition she brought in her thera putty to review & demonstrate the finger/wrist & grip strengthening exercises she is doing at home. Shoulder remains sore anteriorly. PTA suggested pt stop walking the wall at home for a few days to see if that  decreases her shoulder soreness. Pt cannot take cap off her water bottle independently BUt she reports she can use a "grabber" instead of her pliers to open the bottle which she feels is a slight inmprovement in her grip strength.  Pt was able to lift the RTUE to 80 degrees of flexion today.     Rehab Potential  Good    Clinical Impairments Affecting Rehab Potential  osteoporosis, R RTC repair in the past     PT Frequency  2x / week    PT Duration  8 weeks    PT Treatment/Interventions  ADLs/Self Care Home Management;Cryotherapy;Electrical Stimulation;Ultrasound;Moist Heat;Iontophoresis 4mg /ml Dexamethasone;Therapeutic activities;Therapeutic exercise;Patient/family education;Neuromuscular re-education;Manual techniques;Taping;Passive range of motion    PT Next Visit Plan  Try for 90 degrees of shoulder flexion    Consulted and Agree with Plan of Care  Patient       Patient will benefit from skilled therapeutic intervention in order to improve the following deficits and impairments:  Impaired flexibility, Impaired UE functional use, Hypomobility, Decreased strength, Decreased range of motion, Decreased endurance, Decreased activity tolerance, Postural dysfunction, Improper body mechanics, Decreased mobility  Visit Diagnosis: Abnormal posture  Muscle weakness (generalized)  Stiffness of right shoulder, not elsewhere classified  Stiffness of left shoulder, not elsewhere classified  Other abnormalities of gait and mobility     Problem List Patient Active Problem List   Diagnosis Date Noted  . Epiretinal membrane, left eye 04/18/2017  . Acute blood loss anemia 09/27/2015  . Pelvic fracture (HCC) 09/21/2015  . Closed fracture of pelvis (HCC)   . Hypertensive retinopathy of both eyes 08/16/2015  . Hypertension 01/28/2014  . Kyphoscoliosis 09/16/2013  . After cataract 02/19/2013  . Pseudophakia of both eyes 01/22/2013  . Posterior capsular opacification 10/29/2012  . Cystoid macular  edema 04/30/2012  . Epiretinal membrane 04/30/2012  . MVP (mitral valve prolapse) 05/24/2011  . Hypercholesterolemia 05/24/2011    Gavriela Cashin, PTA 12/19/2017, 12:31 PM  [facility] [address], [address] [place], [place],  [id number] Phone: [phone]   Fax:  [phone]  Name: Madison Odom MRN: [id number] Date of Birth: 1934/08/16

## 2017-12-24 ENCOUNTER — Ambulatory Visit: Payer: Medicare Other | Admitting: Physical Therapy

## 2017-12-24 ENCOUNTER — Encounter: Payer: Self-pay | Admitting: Physical Therapy

## 2017-12-24 DIAGNOSIS — M545 Low back pain, unspecified: Secondary | ICD-10-CM

## 2017-12-24 DIAGNOSIS — M25612 Stiffness of left shoulder, not elsewhere classified: Secondary | ICD-10-CM

## 2017-12-24 DIAGNOSIS — M6281 Muscle weakness (generalized): Secondary | ICD-10-CM

## 2017-12-24 DIAGNOSIS — R293 Abnormal posture: Secondary | ICD-10-CM

## 2017-12-24 DIAGNOSIS — R2689 Other abnormalities of gait and mobility: Secondary | ICD-10-CM

## 2017-12-24 DIAGNOSIS — M25611 Stiffness of right shoulder, not elsewhere classified: Secondary | ICD-10-CM

## 2017-12-24 DIAGNOSIS — G8929 Other chronic pain: Secondary | ICD-10-CM

## 2017-12-24 NOTE — Therapy (Signed)
[facility] [address] [place], [place], [address] Phone: [phone]   Fax:  [phone]  Physical Therapy Treatment  Patient Details  Name: Madison Odom MRN: [id number] Date of Birth: December 22, 1933 Referring Provider: Dorothyann Peng, NP   Encounter Date: 12/24/2017  PT End of Session - 12/24/17 1145    Visit Number  9    Authorization Type  [employer] Medicare     Authorization Time Period  11/21/17 to 01/16/18    PT Start Time  9798    PT Stop Time  1224    PT Time Calculation (min)  39 min    Activity Tolerance  Patient tolerated treatment well;No increased pain    Behavior During Therapy  WFL for tasks assessed/performed       Past Medical History:  Diagnosis Date  . GERD (gastroesophageal reflux disease)   . Hyperlipidemia   . MVP (mitral valve prolapse)   . OP (osteoporosis)   . Pelvis fracture ([place]) 09/2015   CLOSED   . Scoliosis   . Spastic colon     Past Surgical History:  Procedure Laterality Date  . ABDOMINAL HYSTERECTOMY  1965  . APPENDECTOMY    . BILATERAL OOPHORECTOMY  1981  . EYE SURGERY    . ROTATOR CUFF REPAIR Right     There were no vitals filed for this visit.  Subjective Assessment - 12/24/17 1149    Subjective  Pt reports soreness after last session. She is sore at her shoulder and elbow.     Pertinent History  LBP for several years especially standing in the kitchen. Most recent fall March 2018 picking up paper off of the kitchen floor. Felt like she couldn't walk around grocery store after the fall and then fell again onto daughter amb up curb from store. Denies vertigo. Uses the rollator at home. Lives alone. Has driver.     Limitations  Lifting;Other (comment)    Diagnostic tests  severe scoliosis; diagnostic testing March 2018    Currently in Pain?  Yes    Pain Score  4     Pain Location  -- Shoulder and elbow 4/10 both    Pain Orientation  Right    Pain Descriptors / Indicators   Sore    Aggravating Factors   overdoing it    Pain Relieving Factors  heat, ice, meds    Multiple Pain Sites  No                      [facility] Adult PT Treatment/Exercise - 12/24/17 0001      Shoulder Exercises: Seated   Horizontal ABduction Limitations  UE ranger: fwd/bkwd/15x, then circles 15x each way    Other Seated Exercises  Biceps 1# 2x10 lowered weight today secondary to elbow soreness    Other Seated Exercises  Scap squeezes 10x2      Shoulder Exercises: Pulleys   Flexion  -- 30x      Shoulder Exercises: Isometric Strengthening   Flexion  5X5" 2 sets, VC to not push as hard    Extension  5X5" 2 sets    External Rotation  5X5"  2 sets    Internal Rotation  5X5" 2 sets      Hand Exercises   Digiticizer  Green 20x 2    Other Hand Exercises  Putty repertoire for HEP rolling, flattening, pinching, gripping RT hand      Wrist Exercises   Wrist Flexion  Strengthening;Right;20 reps;Seated    Wrist Flexion Limitations  1# PTA lowered weights due to soreness/pain    Wrist Extension  Strengthening;Right;20 reps;Seated    Wrist Extension Limitations  1#               PT Short Term Goals - 12/03/17 1314      PT SHORT TERM GOAL #1   Title  Pt will be independent with her initial HEP to improve shoulder ROM and UE strength.    Time  4    Period  Weeks    Status  Achieved        PT Long Term Goals - 12/24/17 1226      PT LONG TERM GOAL #1   Title  Pt will demo improved Rt shoulder elevation active ROM to atleast 90 deg which will assist with reaching overhead while fixing her hair in the mornings.    Time  8    Period  Weeks    Status  On-going      PT LONG TERM GOAL #2   Title  Pt will demo improved shoulder strength to atleast 4/5 MMT which will increase her safety with lifting objects overhead into her kitchen cabinets.     Time  8    Period  Weeks    Status  Partially Met      PT LONG TERM GOAL #3   Title  Pt will be able to open water  bottles without assistance, to reflect an increase in her hand/forearm strength.     Period  Weeks    Status  On-going      PT LONG TERM GOAL #4   Title  Pts wrist/elbow strength will improve by atleast 1 MMT grade, to assist with daily chores and lifting small objects.    Time  8    Period  Weeks    Status  Partially Met            Plan - 12/24/17 1216    Clinical Impression Statement  Pt again reports soreness to her shoulder and elbow after last sesison. She think it may have come from the ROM measurement and MMT. For this reason we reduced the weight/loading to her RTUE for all exercises and continued to work with her grip and fingers via thera putty and velcro board.  Pt needed verbal cuing on her isometrics to not force her contraction so much.  We added a cone stacking activity seated allowing pt to use the RT shoulder but in a smaller ROM than we had previously tried standing at the kitchen counter.     Rehab Potential  Good    Clinical Impairments Affecting Rehab Potential  osteoporosis, R RTC repair in the past     PT Frequency  2x / week    PT Duration  8 weeks    PT Treatment/Interventions  ADLs/Self Care Home Management;Cryotherapy;Electrical Stimulation;Ultrasound;Moist Heat;Iontophoresis 27m/ml Dexamethasone;Therapeutic activities;Therapeutic exercise;Patient/family education;Neuromuscular re-education;Manual techniques;Taping;Passive range of motion    PT Next Visit Plan  If shoulder no worse come Wednesday, try a little bit more cone stacking activity.    Consulted and Agree with Plan of Care  Patient       Patient will benefit from skilled therapeutic intervention in order to improve the following deficits and impairments:  Impaired flexibility, Impaired UE functional use, Hypomobility, Decreased strength, Decreased range of motion, Decreased endurance, Decreased activity tolerance, Postural dysfunction, Improper body mechanics, Decreased mobility  Visit  Diagnosis: Abnormal posture  Muscle  weakness (generalized)  Stiffness of right shoulder, not elsewhere classified  Stiffness of left shoulder, not elsewhere classified  Other abnormalities of gait and mobility  Chronic midline low back pain without sciatica     Problem List Patient Active Problem List   Diagnosis Date Noted  . Epiretinal membrane, left eye 04/18/2017  . Acute blood loss anemia 09/27/2015  . Pelvic fracture ([place]) 09/21/2015  . Closed fracture of pelvis ([place])   . Hypertensive retinopathy of both eyes 08/16/2015  . Hypertension 01/28/2014  . Kyphoscoliosis 09/16/2013  . After cataract 02/19/2013  . Pseudophakia of both eyes 01/22/2013  . Posterior capsular opacification 10/29/2012  . Cystoid macular edema 04/30/2012  . Epiretinal membrane 04/30/2012  . MVP (mitral valve prolapse) 05/24/2011  . Hypercholesterolemia 05/24/2011    Hernandez Losasso, PTA 12/24/2017, 12:27 PM  [facility] [address] [place], [place], [address] Phone: [phone]   Fax:  [phone]  Name: Madison Odom MRN: [id number] Date of Birth: 1934-11-13

## 2017-12-26 ENCOUNTER — Encounter: Payer: Self-pay | Admitting: Physical Therapy

## 2017-12-26 ENCOUNTER — Ambulatory Visit: Payer: Medicare Other | Admitting: Physical Therapy

## 2017-12-26 DIAGNOSIS — M25611 Stiffness of right shoulder, not elsewhere classified: Secondary | ICD-10-CM

## 2017-12-26 DIAGNOSIS — M25612 Stiffness of left shoulder, not elsewhere classified: Secondary | ICD-10-CM

## 2017-12-26 DIAGNOSIS — R293 Abnormal posture: Secondary | ICD-10-CM

## 2017-12-26 DIAGNOSIS — M6281 Muscle weakness (generalized): Secondary | ICD-10-CM

## 2017-12-26 DIAGNOSIS — R2689 Other abnormalities of gait and mobility: Secondary | ICD-10-CM

## 2017-12-26 NOTE — Therapy (Signed)
[facility] [address] [place], [place], [address] Phone: [phone]   Fax:  [phone]  Physical Therapy Treatment  Patient Details  Name: Madison Odom MRN: [id number] Date of Birth: 1934-03-04 Referring Provider: Dorothyann Peng, NP   Encounter Date: 12/26/2017  PT End of Session - 12/26/17 1149    Visit Number  10    Date for PT Re-Evaluation  10/17/17    Authorization Type  [employer] Medicare     Authorization Time Period  11/21/17 to 01/16/18    PT Start Time  1148    PT Stop Time  1226    PT Time Calculation (min)  38 min    Activity Tolerance  Patient tolerated treatment well;No increased pain    Behavior During Therapy  WFL for tasks assessed/performed       Past Medical History:  Diagnosis Date  . GERD (gastroesophageal reflux disease)   . Hyperlipidemia   . MVP (mitral valve prolapse)   . OP (osteoporosis)   . Pelvis fracture ([place]) 09/2015   CLOSED   . Scoliosis   . Spastic colon     Past Surgical History:  Procedure Laterality Date  . ABDOMINAL HYSTERECTOMY  1965  . APPENDECTOMY    . BILATERAL OOPHORECTOMY  1981  . EYE SURGERY    . ROTATOR CUFF REPAIR Right     There were no vitals filed for this visit.  Subjective Assessment - 12/26/17 1151    Subjective  Not as sore after last session. Just a little sore today.     Pertinent History  LBP for several years especially standing in the kitchen. Most recent fall March 2018 picking up paper off of the kitchen floor. Felt like she couldn't walk around grocery store after the fall and then fell again onto daughter amb up curb from store. Denies vertigo. Uses the rollator at home. Lives alone. Has driver.     Limitations  Lifting;Other (comment)    Currently in Pain?  Yes    Pain Score  2     Pain Location  Shoulder    Pain Orientation  Right    Pain Descriptors / Indicators  Sore    Multiple Pain Sites  No         [facility] PT Assessment -  12/26/17 0001      AROM   Overall AROM Comments  Rt shoulder flexion: 82  degrees                   [facility] Adult PT Treatment/Exercise - 12/26/17 0001      Shoulder Exercises: Seated   Horizontal ABduction Limitations  UE ranger: fwd/bkwd/15x, then circles 15x each way    Other Seated Exercises  Biceps 1# 2x10    Other Seated Exercises  cone stacking , 6 cones 3x       Hand Exercises   Digiticizer  Green 20x 2    Other Hand Exercises  Putty repertoire for HEP rolling, flattening, pinching, gripping RT hand    Other Hand Exercises  small velcro key turn 10x      Wrist Exercises   Wrist Flexion  Strengthening;Right;20 reps;Seated    Wrist Flexion Limitations  !    Wrist Extension  Strengthening;Right;20 reps;Seated    Wrist Extension Limitations  1#               PT Short Term Goals - 12/03/17 1314      PT SHORT TERM  GOAL #1   Title  Pt will be independent with her initial HEP to improve shoulder ROM and UE strength.    Time  4    Period  Weeks    Status  Achieved        PT Long Term Goals - 12/24/17 1226      PT LONG TERM GOAL #1   Title  Pt will demo improved Rt shoulder elevation active ROM to atleast 90 deg which will assist with reaching overhead while fixing her hair in the mornings.    Time  8    Period  Weeks    Status  On-going      PT LONG TERM GOAL #2   Title  Pt will demo improved shoulder strength to atleast 4/5 MMT which will increase her safety with lifting objects overhead into her kitchen cabinets.     Time  8    Period  Weeks    Status  Partially Met      PT LONG TERM GOAL #3   Title  Pt will be able to open water bottles without assistance, to reflect an increase in her hand/forearm strength.     Period  Weeks    Status  On-going      PT LONG TERM GOAL #4   Title  Pts wrist/elbow strength will improve by atleast 1 MMT grade, to assist with daily chores and lifting small objects.    Time  8    Period  Weeks    Status   Partially Met            Plan - 12/26/17 1150    Clinical Impression Statement  Pt was able to lift RT arm again to 82 degrees, maintaining since last measurement. Pt is very compliant with her HEP. We continued work with cone stacking at the table level but stacked in a way where pt had to lift her arm slightly higher than last session.  We will see if she tolerates this before moving back to the kitchen cabinets.     Rehab Potential  Good    Clinical Impairments Affecting Rehab Potential  osteoporosis, R RTC repair in the past     PT Frequency  2x / week    PT Duration  8 weeks    PT Treatment/Interventions  ADLs/Self Care Home Management;Cryotherapy;Electrical Stimulation;Ultrasound;Moist Heat;Iontophoresis 78m/ml Dexamethasone;Therapeutic activities;Therapeutic exercise;Patient/family education;Neuromuscular re-education;Manual techniques;Taping;Passive range of motion    PT Next Visit Plan  Try cones to cabinet if not worse.     Consulted and Agree with Plan of Care  Patient       Patient will benefit from skilled therapeutic intervention in order to improve the following deficits and impairments:  Impaired flexibility, Impaired UE functional use, Hypomobility, Decreased strength, Decreased range of motion, Decreased endurance, Decreased activity tolerance, Postural dysfunction, Improper body mechanics, Decreased mobility  Visit Diagnosis: Abnormal posture  Muscle weakness (generalized)  Stiffness of right shoulder, not elsewhere classified  Stiffness of left shoulder, not elsewhere classified  Other abnormalities of gait and mobility     Problem List Patient Active Problem List   Diagnosis Date Noted  . Epiretinal membrane, left eye 04/18/2017  . Acute blood loss anemia 09/27/2015  . [place] 09/21/2015  . [place] fracture of [place]   . Hypertensive retinopathy of both eyes 08/16/2015  . Hypertension 01/28/2014  . Kyphoscoliosis 09/16/2013  .  After cataract 02/19/2013  . Pseudophakia of both eyes 01/22/2013  . Posterior capsular  opacification 10/29/2012  . Cystoid macular edema 04/30/2012  . Epiretinal membrane 04/30/2012  . MVP (mitral valve prolapse) 05/24/2011  . Hypercholesterolemia 05/24/2011    COCHRAN,JENNIFER, PTA 12/26/2017, 12:25 PM  [facility] [address], [place], [place], [address] Phone: [phone]   Fax:  [phone]  Name: Madison Odom MRN: [id number] Date of Birth: September 20, 1934

## 2017-12-31 ENCOUNTER — Ambulatory Visit: Payer: Medicare Other | Admitting: Physical Therapy

## 2017-12-31 ENCOUNTER — Encounter: Payer: Self-pay | Admitting: Physical Therapy

## 2017-12-31 DIAGNOSIS — R2689 Other abnormalities of gait and mobility: Secondary | ICD-10-CM

## 2017-12-31 DIAGNOSIS — M25611 Stiffness of right shoulder, not elsewhere classified: Secondary | ICD-10-CM

## 2017-12-31 DIAGNOSIS — M6281 Muscle weakness (generalized): Secondary | ICD-10-CM

## 2017-12-31 DIAGNOSIS — R293 Abnormal posture: Secondary | ICD-10-CM

## 2017-12-31 DIAGNOSIS — M25612 Stiffness of left shoulder, not elsewhere classified: Secondary | ICD-10-CM

## 2017-12-31 NOTE — Therapy (Signed)
[facility] [address] [place], [place], [address] Phone: [phone]   Fax:  [phone]  Physical Therapy Treatment  Patient Details  Name: Madison Odom MRN: [id number] Date of Birth: 02/04/1934 Referring Provider: Dorothyann Peng, NP   Encounter Date: 12/31/2017  PT End of Session - 12/31/17 1150    Visit Number  [place] [employer]     Authorization Time Period  11/21/17 to 01/16/18    PT Start Time  1147    PT Stop Time  1225    PT Time Calculation (min)  38 min    Activity Tolerance  Patient tolerated treatment well;No increased pain    Behavior During Therapy  WFL for tasks assessed/performed       Past Medical History:  Diagnosis Date  . GERD (gastroesophageal reflux disease)   . Hyperlipidemia   . MVP (mitral valve prolapse)   . OP (osteoporosis)   . Pelvis fracture ([place]) 09/2015   CLOSED   . Scoliosis   . Spastic colon     Past Surgical History:  Procedure Laterality Date  . ABDOMINAL HYSTERECTOMY  1965  . APPENDECTOMY    . BILATERAL OOPHORECTOMY  1981  . EYE SURGERY    . ROTATOR CUFF REPAIR Right     There were no vitals filed for this visit.  Subjective Assessment - 12/31/17 1151    Subjective  Just a litle sore after last session but not bad.    Pertinent History  LBP for several years especially standing in the kitchen. Most recent fall March 2018 picking up paper off of the kitchen floor. Felt like she couldn't walk around grocery store after the fall and then fell again onto daughter amb up curb from store. Denies vertigo. Uses the rollator at home. Lives alone. Has driver.     Diagnostic tests  severe scoliosis; diagnostic testing March 2018    Currently in Pain?  Yes    Pain Score  2     Pain Location  Shoulder    Pain Orientation  Right    Pain Descriptors / Indicators  Dull;Sore    Aggravating Factors   ?    Pain Relieving Factors  heat, ice, meds    Multiple  Pain Sites  No                      [facility] Adult PT Treatment/Exercise - 12/31/17 0001      Shoulder Exercises: Seated   Horizontal ABduction Limitations  UE ranger: fwd/bkwd/15x, then circles 15x each way    Other Seated Exercises  Biceps 1# 3x 10    Other Seated Exercises  Scap squeezes 10x after pulleys      Shoulder Exercises: Standing   Other Standing Exercises  Cones to 1st shelf: 3 cones up & down 1x each      Shoulder Exercises: Isometric Strengthening   Flexion  5X5"    Extension  5X5"    External Rotation  5X5"    Internal Rotation  5X5"      Hand Exercises   Digiticizer  Green 20x 2 Pt compliant with this exercises at home    Other Hand Exercises  Will do at home    Other Hand Exercises  small velcro key turn 10x and rolls forward & back with round piece. 10x      Wrist Exercises   Wrist Flexion  Strengthening;Right;20 reps;Seated  Wrist Flexion Limitations  1 VC to properly initiate the exercise    Wrist Extension  Strengthening;Right;20 reps;Seated VC/TC for proper initiation of exercise.     Wrist Extension Limitations  1#               PT Short Term Goals - 12/03/17 1314      PT SHORT TERM GOAL #1   Title  Pt will be independent with her initial HEP to improve shoulder ROM and UE strength.    Time  4    Period  Weeks    Status  Achieved        PT Long Term Goals - 12/24/17 1226      PT LONG TERM GOAL #1   Title  Pt will demo improved Rt shoulder elevation active ROM to atleast 90 deg which will assist with reaching overhead while fixing her hair in the mornings.    Time  8    Period  Weeks    Status  On-going      PT LONG TERM GOAL #2   Title  Pt will demo improved shoulder strength to atleast 4/5 MMT which will increase her safety with lifting objects overhead into her kitchen cabinets.     Time  8    Period  Weeks    Status  Partially Met      PT LONG TERM GOAL #3   Title  Pt will be able to open water bottles without  assistance, to reflect an increase in her hand/forearm strength.     Period  Weeks    Status  On-going      PT LONG TERM GOAL #4   Title  Pts wrist/elbow strength will improve by atleast 1 MMT grade, to assist with daily chores and lifting small objects.    Time  8    Period  Weeks    Status  Partially Met            Plan - 12/31/17 1150    Clinical Impression Statement  Since pt's shoulder/arm pain did not increase significnatly after last session we proceeded back to the first shelf where she could fairly comfortably put 3 cones up and back down. Still no improvement opening items or fixing hair.     Rehab Potential  Good    Clinical Impairments Affecting Rehab Potential  osteoporosis, R RTC repair in the past     PT Frequency  2x / week    PT Duration  8 weeks    PT Treatment/Interventions  ADLs/Self Care Home Management;Cryotherapy;Electrical Stimulation;Ultrasound;Moist Heat;Iontophoresis 48m/ml Dexamethasone;Therapeutic activities;Therapeutic exercise;Patient/family education;Neuromuscular re-education;Manual techniques;Taping;Passive range of motion    PT Next Visit Plan  MMT RT UE, Take AROM for goals/ goal review. Consider DC to HEP.    Consulted and Agree with Plan of Care  Patient       Patient will benefit from skilled therapeutic intervention in order to improve the following deficits and impairments:  Impaired flexibility, Impaired UE functional use, Hypomobility, Decreased strength, Decreased range of motion, Decreased endurance, Decreased activity tolerance, Postural dysfunction, Improper body mechanics, Decreased mobility  Visit Diagnosis: Abnormal posture  Muscle weakness (generalized)  Stiffness of right shoulder, not elsewhere classified  Stiffness of left shoulder, not elsewhere classified  Other abnormalities of gait and mobility     Problem List Patient Active Problem List   Diagnosis Date Noted  . Epiretinal membrane, left eye 04/18/2017  .  Acute blood loss anemia 09/27/2015  . Pelvic fracture (  [place]) 09/21/2015  . Closed fracture of pelvis ([place])   . Hypertensive retinopathy of both eyes 08/16/2015  . Hypertension 01/28/2014  . Kyphoscoliosis 09/16/2013  . After cataract 02/19/2013  . Pseudophakia of both eyes 01/22/2013  . Posterior capsular opacification 10/29/2012  . Cystoid macular edema 04/30/2012  . Epiretinal membrane 04/30/2012  . MVP (mitral valve prolapse) 05/24/2011  . Hypercholesterolemia 05/24/2011    Zeki Bedrosian, PTA 12/31/2017, 12:33 PM  [facility] [address] [place], [place], [address] Phone: [phone]   Fax:  [phone]  Name: Madison Odom MRN: [id number] Date of Birth: 05/07/1934

## 2018-01-02 ENCOUNTER — Encounter: Payer: Self-pay | Admitting: Physical Therapy

## 2018-01-02 ENCOUNTER — Ambulatory Visit: Payer: Medicare Other | Admitting: Physical Therapy

## 2018-01-02 DIAGNOSIS — M25611 Stiffness of right shoulder, not elsewhere classified: Secondary | ICD-10-CM

## 2018-01-02 DIAGNOSIS — R293 Abnormal posture: Secondary | ICD-10-CM

## 2018-01-02 DIAGNOSIS — M6281 Muscle weakness (generalized): Secondary | ICD-10-CM

## 2018-01-02 DIAGNOSIS — M25612 Stiffness of left shoulder, not elsewhere classified: Secondary | ICD-10-CM

## 2018-01-02 NOTE — Patient Instructions (Signed)
   CANE EXTERNAL ROTATION STRETCH - PILLOW  Lie on your back with a pillow under your affect shoulder. Hold a cane or wand with both hands.   On the affected side, maintain a 90 degree bend at the elbow with your arm approximately 30-45 degrees away from your side.   Use your other arm to push the cane to rotate the affected arm into a stretch. Hold and then return to starting position and then repeat.       [facility] [address], [address] [place], [place] [address] Phone # [phone] Fax [phone]

## 2018-01-02 NOTE — Therapy (Signed)
[facility] [address] [place], [place], [address] Phone: [phone]   Fax:  [phone]  Physical Therapy Treatment  Patient Details  Name: Madison Odom MRN: [id number] Date of Birth: 21-Jan-1934 Referring Provider: Dorothyann Peng, NP   Encounter Date: 01/02/2018  PT End of Session - 01/02/18 1413    Visit Number  12    Authorization Type  [employer] Medicare     Authorization Time Period  11/21/17 to 01/16/18    PT Start Time  6734    PT Stop Time  1229    PT Time Calculation (min)  44 min    Activity Tolerance  Patient tolerated treatment well;No increased pain    Behavior During Therapy  WFL for tasks assessed/performed       Past Medical History:  Diagnosis Date  . GERD (gastroesophageal reflux disease)   . Hyperlipidemia   . MVP (mitral valve prolapse)   . OP (osteoporosis)   . Pelvis fracture ([place]) 09/2015   CLOSED   . Scoliosis   . Spastic colon     Past Surgical History:  Procedure Laterality Date  . ABDOMINAL HYSTERECTOMY  1965  . APPENDECTOMY    . BILATERAL OOPHORECTOMY  1981  . EYE SURGERY    . ROTATOR CUFF REPAIR Right     There were no vitals filed for this visit.  Subjective Assessment - 01/02/18 1147    Subjective  Pt reports that things are improving. She thinks that overall she is 40% improved. She is still unable to reach her 2nd shelf to get to her spices at home. She thinks that pulley has helped some. She will have soreness following alot of her exercises, but this usually resolves after about 24 hours.     Pertinent History  LBP for several years especially standing in the kitchen. Most recent fall March 2018 picking up paper off of the kitchen floor. Felt like she couldn't walk around grocery store after the fall and then fell again onto daughter amb up curb from store. Denies vertigo. Uses the rollator at home. Lives alone. Has driver.     Diagnostic tests  severe scoliosis; diagnostic  testing March 2018    Currently in Pain?  Yes    Pain Score  4     Pain Location  Other (Comment) Rt forearm     Pain Orientation  Right    Pain Descriptors / Indicators  Aching;Sore    Pain Type  Acute pain    Pain Radiating Towards  none     Pain Onset  In the past 7 days    Pain Frequency  Intermittent    Aggravating Factors   alot of exercise    Pain Relieving Factors  stretching, meds, some heat     Effect of Pain on Daily Activities  unable to reach spices in top shelf          [facility] PT Assessment - 01/02/18 0001      Assessment   Medical Diagnosis  Muscle weakness/upper extremity weakness     Referring Provider  Dorothyann Peng, NP    Hand Dominance  Right    Next MD Visit  next week     Prior Therapy  last year for balance/LE strengthening       Precautions   Precautions  None      [place] residence    Additional Comments  caregiver that comes Monday  and Wednesday for 4 hours to help with any chores/errands       Prior Function   Level of Independence  Independent with basic ADLs      AROM   Overall AROM Comments  shoulder AROM: Rt 80 deg, Lt 90 deg  Rt shoulder flexion to 70 deg       Strength   Overall Strength Comments  grip strength: Rt 5.6 lb; Lt 6 lb    Right Shoulder Flexion  3/5    Right Shoulder ABduction  3/5    Right Shoulder Internal Rotation  3/5    Right Shoulder External Rotation  3/5 0 deg ROM with elbow by side    Left Shoulder Flexion  3+/5    Left Shoulder ABduction  3/5    Left Shoulder Internal Rotation  3/5    Left Shoulder External Rotation  3/5    Right Elbow Flexion  4/5    Right Elbow Extension  4-/5    Left Elbow Flexion  4/5    Left Elbow Extension  4/5                  [facility] Adult PT Treatment/Exercise - 01/02/18 0001      Lumbar Exercises: Seated   Other Seated Lumbar Exercises  thoracic extension over foam roll x5 reps, thoracic rotation Lt/Rt x5 reps over foam roll        Lumbar Exercises: Supine   Other Supine Lumbar Exercises  --      Knee/Hip Exercises: Supine   Other Supine Knee/Hip Exercises  incline shoulder flexion AAROM with SPC x5 reps (cues to keep elbow in)      Shoulder Exercises: Supine   External Rotation  AAROM;Right;5 reps;Limitations    External Rotation Limitations  incline mat     Other Supine Exercises  incline shoulder flexion AAROM with SPC x5 reps (cues to keep elbow in)      Wrist Exercises   Other wrist exercises  B pronation/supination x5 reps each with yellow TB (heavy cuing for proper technique)              PT Education - 01/02/18 1240    Education provided  Yes    Education Details  factors limiting overall progress towards personal goals; plan moving forward; importance of increasing posture awareness to assist with available range of the shoulder; technique with HEP; encouraged pt to purchase a pulley for home     Person(s) Educated  Patient    Methods  Explanation;Tactile cues;Verbal cues;Handout    Comprehension  Verbalized understanding;Returned demonstration       PT Short Term Goals - 01/02/18 1159      PT SHORT TERM GOAL #1   Title  Pt will be independent with her initial HEP to improve shoulder ROM and UE strength.    Time  4    Period  Weeks    Status  Achieved        PT Long Term Goals - 01/02/18 1200      PT LONG TERM GOAL #1   Title  Pt will demo improved Rt shoulder elevation active ROM to atleast 90 deg which will assist with reaching overhead while fixing her hair in the mornings.    Baseline  Rt 80 deg, Lt 90 deg     Time  8    Period  Weeks    Status  Partially Met      PT LONG TERM GOAL #2  Title  Pt will demo improved shoulder strength to atleast 4/5 MMT which will increase her safety with lifting objects overhead into her kitchen cabinets.     Baseline  slight improvements     Time  8    Period  Weeks    Status  On-going      PT LONG TERM GOAL #3   Title  Pt will be able to  open water bottles without assistance, to reflect an increase in her hand/forearm strength.     Baseline  Pt doesn't feel this has improved    Time  8    Period  Weeks    Status  On-going      PT LONG TERM GOAL #4   Title  Pts wrist/elbow strength will improve by atleast 1 MMT grade, to assist with daily chores and lifting small objects.    Time  8    Period  Weeks    Status  Achieved      PT LONG TERM GOAL #5   Title  Pt will report improved UE function by atleast 50% which will allow her to fix her hair, open jars with less difficulty.    Baseline  40%    Time  8    Period  Weeks    Status  Partially Met            Plan - 01/02/18 1216    Clinical Impression Statement  Pt is making slow progress towards goals, although she does report atleast 40% improvement in her UE functional use overall. Her grip strength is significant diminished, although symmetrical, and her Lt Rt shoulder is improved 10 deg actively. Pt continues to report HEP adherence, however therapist does still need to provide cues for proper technique with several that were reviewed this session, making it difficult to know how accurate she is performing these at home. Pt has noted crepitus of the scapular and glenohumeral joint during passive motion in addition to structural limitations in the lumbar/thoracic spine which are contributing to her lack of shoulder elevation ROM, and this was discussed with her. Pt would continue to benefit from further PT instruction on proper HEP technique, posture awareness and to make adjustments to her daily routine which will allow her to adapt to any remaining limitations following discharge from PT. Pt verbalized understanding and agreement with this.     Rehab Potential  Good    Clinical Impairments Affecting Rehab Potential  osteoporosis, R RTC repair in the past     PT Frequency  2x / week    PT Duration  8 weeks    PT Treatment/Interventions  ADLs/Self Care Home  Management;Cryotherapy;Electrical Stimulation;Ultrasound;Moist Heat;Iontophoresis 46m/ml Dexamethasone;Therapeutic activities;Therapeutic exercise;Patient/family education;Neuromuscular re-education;Manual techniques;Taping;Passive range of motion    PT Next Visit Plan  active thoracic extension and rotation over foam roll; stretches for wrist/forearm to address soreness; inclined shoulder AAROM; review adjustments to spice rack for easier access (use other UE, move spices, etc.)     Consulted and Agree with Plan of Care  Patient       Patient will benefit from skilled therapeutic intervention in order to improve the following deficits and impairments:  Impaired flexibility, Impaired UE functional use, Hypomobility, Decreased strength, Decreased range of motion, Decreased endurance, Decreased activity tolerance, Postural dysfunction, Improper body mechanics, Decreased mobility  Visit Diagnosis: Abnormal posture  Muscle weakness (generalized)  Stiffness of right shoulder, not elsewhere classified  Stiffness of left shoulder, not elsewhere classified  Problem List Patient Active Problem List   Diagnosis Date Noted  . Epiretinal membrane, left eye 04/18/2017  . Acute blood loss anemia 09/27/2015  . Pelvic fracture ([place]) 09/21/2015  . Closed fracture of pelvis ([place])   . Hypertensive retinopathy of both eyes 08/16/2015  . Hypertension 01/28/2014  . Kyphoscoliosis 09/16/2013  . After cataract 02/19/2013  . Pseudophakia of both eyes 01/22/2013  . Posterior capsular opacification 10/29/2012  . Cystoid macular edema 04/30/2012  . Epiretinal membrane 04/30/2012  . MVP (mitral valve prolapse) 05/24/2011  . Hypercholesterolemia 05/24/2011    2:28 PM,01/02/18 Sherol Dade PT, DPT [facility] [address], [place], [place], [address] Phone: [phone]    Fax:  [phone]  Name: Madison Odom MRN: [id number] Date of Birth: 1934/08/21

## 2018-01-07 ENCOUNTER — Ambulatory Visit: Payer: Medicare Other | Admitting: Physical Therapy

## 2018-01-07 ENCOUNTER — Encounter: Payer: Self-pay | Admitting: Physical Therapy

## 2018-01-07 DIAGNOSIS — M25612 Stiffness of left shoulder, not elsewhere classified: Secondary | ICD-10-CM

## 2018-01-07 DIAGNOSIS — G8929 Other chronic pain: Secondary | ICD-10-CM

## 2018-01-07 DIAGNOSIS — M545 Low back pain, unspecified: Secondary | ICD-10-CM

## 2018-01-07 DIAGNOSIS — R293 Abnormal posture: Secondary | ICD-10-CM | POA: Diagnosis not present

## 2018-01-07 DIAGNOSIS — M6281 Muscle weakness (generalized): Secondary | ICD-10-CM

## 2018-01-07 DIAGNOSIS — M25611 Stiffness of right shoulder, not elsewhere classified: Secondary | ICD-10-CM

## 2018-01-07 DIAGNOSIS — R2689 Other abnormalities of gait and mobility: Secondary | ICD-10-CM

## 2018-01-07 NOTE — Patient Instructions (Signed)
Composite Flexion (Active Extensor Stretch)    Curl fingers into fist, bend wrist down, and straighten elbow. Hold _10-20___ seconds. Repeat _2-3___ times. Do __1-2__ sessions per day.  Copyright  VHI. All rights reserved.   #2 Praying hands with slight elbow lift; place hands in praying position and gently lift the elbows. Hold this for 10-20 seconds. Do 2x either 1-2 day    Reminder: Just a gentle stretch, nothing more than that.

## 2018-01-07 NOTE — Therapy (Signed)
[facility] [address], [place], [place], [address] Phone: [phone]   Fax:  [phone]  Physical Therapy Treatment  Patient Details  Name: Madison Odom MRN: [id number] Date of Birth: [birth year] Referring Provider: Dorothyann Peng, NP   Encounter Date: 01/07/2018  PT End of Session - 01/07/18 1137    Visit Number  13    Number of Visits  16    Authorization Type  UHC Medicare     Authorization Time Period  11/21/17 to 01/16/18    PT Start Time  1137    PT Stop Time  1215    PT Time Calculation (min)  38 min    Activity Tolerance  Patient tolerated treatment well;No increased pain    Behavior During Therapy  WFL for tasks assessed/performed       Past Medical History:  Diagnosis Date  . GERD (gastroesophageal reflux disease)   . Hyperlipidemia   . MVP (mitral valve prolapse)   . OP (osteoporosis)   . Pelvis fracture ([place]) 09/2015   CLOSED   . Scoliosis   . Spastic colon     Past Surgical History:  Procedure Laterality Date  . ABDOMINAL HYSTERECTOMY  1965  . APPENDECTOMY    . BILATERAL OOPHORECTOMY  1981  . EYE SURGERY    . ROTATOR CUFF REPAIR Right     There were no vitals filed for this visit.  Subjective Assessment - 01/07/18 1140    Subjective  Just above elbow is a little sore, not bad.    Pertinent History  LBP for several years especially standing in the kitchen. Most recent fall March 2018 picking up paper off of the kitchen floor. Felt like she couldn't walk around grocery store after the fall and then fell again onto daughter amb up curb from store. Denies vertigo. Uses the rollator at home. Lives alone. Has driver.     Currently in Pain?  Yes    Pain Score  2     Pain Location  Arm    Pain Orientation  Right    Pain Descriptors / Indicators  Dull    Aggravating Factors   a lot of exercise    Pain Relieving Factors  meds, heat    Multiple Pain Sites  No                       [facility] Adult PT Treatment/Exercise - 01/07/18 0001      Self-Care   Self-Care  Other Self-Care Comments Review of spice rack organization: pt could not reconfigure,    Other Self-Care Comments   Will use her LTUE mainly      Shoulder Exercises: Supine   External Rotation  AAROM;Right;10 reps;Limitations VC to keep elbow down    External Rotation Limitations  incline mat     Other Supine Exercises  incline shoulder flexion AAROM with SPC 2 x10 reps (cues to keep elbow in)      Shoulder Exercises: Pulleys   Flexion  -- 20x      Shoulder Exercises: Stretch   Other Shoulder Stretches  Seated thoracic extension over the half foam roll 5x2 at 2 different level of her T spine      Hand Exercises   Digiticizer  Green 20x 2 Pt compliant with this exercises at home      Wrist Exercises   Other wrist exercises  Wirst ext & flex stretches bil 10 sec 2x  VC/demo  for flexion stretch    Other wrist exercises  Bil yellow band Sup/pro/ 10 x each TC to organize band for proper resistance.             PT Education - 01/07/18 1152    Education provided  Yes    Education Details  wrist stretches    Person(s) Educated  Patient    Methods  Explanation;Demonstration;Tactile cues;Verbal cues;Handout    Comprehension  Returned demonstration;Verbalized understanding       PT Short Term Goals - 01/02/18 1159      PT SHORT TERM GOAL #1   Title  Pt will be independent with her initial HEP to improve shoulder ROM and UE strength.    Time  4    Period  Weeks    Status  Achieved        PT Long Term Goals - 01/02/18 1200      PT LONG TERM GOAL #1   Title  Pt will demo improved Rt shoulder elevation active ROM to atleast 90 deg which will assist with reaching overhead while fixing her hair in the mornings.    Baseline  Rt 80 deg, Lt 90 deg     Time  8    Period  Weeks    Status  Partially Met      PT LONG TERM GOAL #2   Title  Pt will demo improved  shoulder strength to atleast 4/5 MMT which will increase her safety with lifting objects overhead into her kitchen cabinets.     Baseline  slight improvements     Time  8    Period  Weeks    Status  On-going      PT LONG TERM GOAL #3   Title  Pt will be able to open water bottles without assistance, to reflect an increase in her hand/forearm strength.     Baseline  Pt doesn't feel this has improved    Time  8    Period  Weeks    Status  On-going      PT LONG TERM GOAL #4   Title  Pts wrist/elbow strength will improve by atleast 1 MMT grade, to assist with daily chores and lifting small objects.    Time  8    Period  Weeks    Status  Achieved      PT LONG TERM GOAL #5   Title  Pt will report improved UE function by atleast 50% which will allow her to fix her hair, open jars with less difficulty.    Baseline  40%    Time  8    Period  Weeks    Status  Partially Met            Plan - 01/07/18 1138    Clinical Impression Statement  Pt reports doing well with her cane exercises and she has ordered a pulley for home use. She was educated in finger, wrist/forearm stretches to add to her HEP. Pt needs some form of visual cuing for her band supination/pronantion exercises. She uses her visual sheets at home and this helps. She really tolerated her thoracic extension over the half roll well. She was instructed to pay attention to how she felt there for the rest of the day so she could give [place] a report on Wednesday. Otherwise no complaints of pain during her session today.     Rehab Potential  Good    Clinical Impairments Affecting Rehab Potential  osteoporosis,  R RTC repair in the past     PT Frequency  2x / week    PT Duration  8 weeks    PT Treatment/Interventions  ADLs/Self Care Home Management;Cryotherapy;Electrical Stimulation;Ultrasound;Moist Heat;Iontophoresis 60m/ml Dexamethasone;Therapeutic activities;Therapeutic exercise;Patient/family education;Neuromuscular  re-education;Manual techniques;Taping;Passive range of motion    PT Next Visit Plan  See how pt tolerated todays session and proceed from there.     Consulted and Agree with Plan of Care  --       Patient will benefit from skilled therapeutic intervention in order to improve the following deficits and impairments:  Impaired flexibility, Impaired UE functional use, Hypomobility, Decreased strength, Decreased range of motion, Decreased endurance, Decreased activity tolerance, Postural dysfunction, Improper body mechanics, Decreased mobility  Visit Diagnosis: Abnormal posture  Muscle weakness (generalized)  Stiffness of right shoulder, not elsewhere classified  Stiffness of left shoulder, not elsewhere classified  Other abnormalities of gait and mobility  Chronic midline low back pain without sciatica     Problem List Patient Active Problem List   Diagnosis Date Noted  . Epiretinal membrane, left eye 04/18/2017  . Acute blood loss anemia 09/27/2015  . [place] 09[place]  . [place]   . Hypertensive retinopathy of both eyes 08/16/2015  . Hypertension 01/28/2014  . Kyphoscoliosis 09/16/2013  . After cataract 02/19/2013  . Pseudophakia of both eyes 01/22/2013  . Posterior capsular opacification 10/29/2012  . Cystoid macular edema 04/30/2012  . Epiretinal membrane 04/30/2012  . MVP (mitral valve prolapse) 05/24/2011  . Hypercholesterolemia 05/24/2011    Meade Hogeland, PTA 01/07/2018, 12:20 PM  [facility] [address] [place] [address]: [phone]  Fax:  [phone] Name: Madison GLANDONMRN: [id number] of Birth: [birth year]

## 2018-01-09 ENCOUNTER — Encounter: Payer: Self-pay | Admitting: Physical Therapy

## 2018-01-09 ENCOUNTER — Ambulatory Visit: Payer: Medicare Other | Admitting: Physical Therapy

## 2018-01-09 DIAGNOSIS — R293 Abnormal posture: Secondary | ICD-10-CM

## 2018-01-09 DIAGNOSIS — M25612 Stiffness of left shoulder, not elsewhere classified: Secondary | ICD-10-CM

## 2018-01-09 DIAGNOSIS — M25611 Stiffness of right shoulder, not elsewhere classified: Secondary | ICD-10-CM

## 2018-01-09 DIAGNOSIS — M6281 Muscle weakness (generalized): Secondary | ICD-10-CM

## 2018-01-09 NOTE — Therapy (Signed)
[facility] [address] [place], [place], [address] Phone: [phone]   Fax:  [phone]  Physical Therapy Treatment  Patient Details  Name: Madison Odom MRN: [id number] Date of Birth: Jan 10, 1934 Referring Provider: Dorothyann Peng, NP   Encounter Date: 01/09/2018  PT End of Session - 01/09/18 1223    Visit Number  14    Number of Visits  16    Authorization Type  UHC Medicare     Authorization Time Period  11/21/17 to 01/16/18    PT Start Time  3546    PT Stop Time  1223    PT Time Calculation (min)  38 min    Activity Tolerance  Patient tolerated treatment well;No increased pain    Behavior During Therapy  WFL for tasks assessed/performed       Past Medical History:  Diagnosis Date  . GERD (gastroesophageal reflux disease)   . Hyperlipidemia   . MVP (mitral valve prolapse)   . OP (osteoporosis)   . Pelvis fracture ([place]) 09/2015   CLOSED   . Scoliosis   . Spastic colon     Past Surgical History:  Procedure Laterality Date  . ABDOMINAL HYSTERECTOMY  1965  . APPENDECTOMY    . BILATERAL OOPHORECTOMY  1981  . EYE SURGERY    . ROTATOR CUFF REPAIR Right     There were no vitals filed for this visit.  Subjective Assessment - 01/09/18 1149    Subjective  Pt reports that she was able to reach the towel rack in her house for the first time the other day. She has some soreness in the Rt arm, but it is not too bad.     Pertinent History  LBP for several years especially standing in the kitchen. Most recent fall March 2018 picking up paper off of the kitchen floor. Felt like she couldn't walk around grocery store after the fall and then fell again onto daughter amb up curb from store. Denies vertigo. Uses the rollator at home. Lives alone. Has driver.     Currently in Pain?  Yes    Pain Score  2     Pain Location  Arm    Pain Orientation  Right    Pain Descriptors / Indicators  Aching;Dull    Pain Type  Acute  pain    Pain Radiating Towards  none     Pain Onset  1 to 4 weeks ago    Pain Frequency  Intermittent    Aggravating Factors   alot of excessive movement    Pain Relieving Factors  meds, heat     Effect of Pain on Daily Activities  difficulty reaching top shelf                      [facility] Adult PT Treatment/Exercise - 01/09/18 0001      Shoulder Exercises: Supine   External Rotation  AAROM;Both;10 reps    External Rotation Limitations  inclined mat x2 sets     Flexion  Both;AAROM;Limitations;10 reps    Flexion Limitations  inclined mat x2 sets     Other Supine Exercises  incline shoulder press with SPC and 3# weight x5 reps only, decreased to no weight x10 reps for improved range      Shoulder Exercises: Pulleys   Flexion  3 minutes;Other (comment) RUE, lumbar roll     ABduction  -- RUE, with lumbar roll ; 1.5 min  Shoulder Exercises: Stretch   Other Shoulder Stretches  seated thoracic extension over foam roll x20 reps, seated thoracic extension with rotation Lt/Rt x20 reps each       Hand Exercises   Digiticizer  Green initially and decreased to red for last 2 reps, 5x10 sec Lt and Rt       Wrist Exercises   Wrist Radial Deviation  Both;20 reps    Wrist Radial Deviation Limitations  holding bottom of 2# dumbbell, therapist cuing to decreased supination compensation    Other wrist exercises  wrist pronation/supination x20 reps holding end of 2# dumbbell              PT Education - 01/09/18 1223    Education provided  Yes    Education Details  technique with therex    Person(s) Educated  Patient    Methods  Explanation;Verbal cues;Tactile cues    Comprehension  Verbalized understanding;Returned demonstration       PT Short Term Goals - 01/02/18 1159      PT SHORT TERM GOAL #1   Title  Pt will be independent with her initial HEP to improve shoulder ROM and UE strength.    Time  4    Period  Weeks    Status  Achieved        PT Long Term  Goals - 01/02/18 1200      PT LONG TERM GOAL #1   Title  Pt will demo improved Rt shoulder elevation active ROM to atleast 90 deg which will assist with reaching overhead while fixing her hair in the mornings.    Baseline  Rt 80 deg, Lt 90 deg     Time  8    Period  Weeks    Status  Partially Met      PT LONG TERM GOAL #2   Title  Pt will demo improved shoulder strength to atleast 4/5 MMT which will increase her safety with lifting objects overhead into her kitchen cabinets.     Baseline  slight improvements     Time  8    Period  Weeks    Status  On-going      PT LONG TERM GOAL #3   Title  Pt will be able to open water bottles without assistance, to reflect an increase in her hand/forearm strength.     Baseline  Pt doesn't feel this has improved    Time  8    Period  Weeks    Status  On-going      PT LONG TERM GOAL #4   Title  Pts wrist/elbow strength will improve by atleast 1 MMT grade, to assist with daily chores and lifting small objects.    Time  8    Period  Weeks    Status  Achieved      PT LONG TERM GOAL #5   Title  Pt will report improved UE function by atleast 50% which will allow her to fix her hair, open jars with less difficulty.    Baseline  40%    Time  8    Period  Weeks    Status  Partially Met            Plan - 01/09/18 1223    Clinical Impression Statement  Pt is making progress towards increasing functional use of the RUE, reporting she was recently able to reach her towel rack at home without the need for assistance. Continued with therex to promote  shoulder ROM and strength. Pt requires cuing throughout the session to improve her technique, particularly with posture and wrist position during strengthening exercises. Pt reported no increase in pain following the session, but therapist encouraged her to ice the shoulder at home. Pt was agreeable to this. Will continue with current POC.    Rehab Potential  Good    Clinical Impairments Affecting Rehab  Potential  osteoporosis, R RTC repair in the past     PT Frequency  2x / week    PT Duration  8 weeks    PT Treatment/Interventions  ADLs/Self Care Home Management;Cryotherapy;Electrical Stimulation;Ultrasound;Moist Heat;Iontophoresis 66m/ml Dexamethasone;Therapeutic activities;Therapeutic exercise;Patient/family education;Neuromuscular re-education;Manual techniques;Taping;Passive range of motion    PT Next Visit Plan  f/u on ice application; continue with incline shoulder strengthening, wrist pronation/supination (dumbbell or TB), thoracic/posture strengthening    Consulted and Agree with Plan of Care  Patient       Patient will benefit from skilled therapeutic intervention in order to improve the following deficits and impairments:  Impaired flexibility, Impaired UE functional use, Hypomobility, Decreased strength, Decreased range of motion, Decreased endurance, Decreased activity tolerance, Postural dysfunction, Improper body mechanics, Decreased mobility  Visit Diagnosis: Abnormal posture  Muscle weakness (generalized)  Stiffness of right shoulder, not elsewhere classified  Stiffness of left shoulder, not elsewhere classified     Problem List Patient Active Problem List   Diagnosis Date Noted  . Epiretinal membrane, left eye 04/18/2017  . Acute blood loss anemia 09/27/2015  . [place] 09[place]  . [place]   . Hypertensive retinopathy of both eyes 08/16/2015  . Hypertension 01/28/2014  . Kyphoscoliosis 09/16/2013  . After cataract 02/19/2013  . Pseudophakia of both eyes 01/22/2013  . Posterior capsular opacification 10/29/2012  . Cystoid macular edema 04/30/2012  . Epiretinal membrane 04/30/2012  . MVP (mitral valve prolapse) 05/24/2011  . Hypercholesterolemia 05/24/2011    12:27 PM,01/09/18 SSherol DadePT, DPT [facility] [address] [place] [address]Phone: [phone]  Fax:  [phone] Name: Madison SALINEMRN: [id number] of Birth: 5August 13, 1935

## 2018-01-14 ENCOUNTER — Encounter: Payer: Self-pay | Admitting: Physical Therapy

## 2018-01-14 ENCOUNTER — Ambulatory Visit: Payer: Medicare Other | Attending: Adult Health | Admitting: Physical Therapy

## 2018-01-14 DIAGNOSIS — M6281 Muscle weakness (generalized): Secondary | ICD-10-CM | POA: Diagnosis present

## 2018-01-14 DIAGNOSIS — G8929 Other chronic pain: Secondary | ICD-10-CM | POA: Diagnosis present

## 2018-01-14 DIAGNOSIS — M25611 Stiffness of right shoulder, not elsewhere classified: Secondary | ICD-10-CM | POA: Diagnosis present

## 2018-01-14 DIAGNOSIS — M25612 Stiffness of left shoulder, not elsewhere classified: Secondary | ICD-10-CM | POA: Diagnosis present

## 2018-01-14 DIAGNOSIS — M545 Low back pain: Secondary | ICD-10-CM | POA: Insufficient documentation

## 2018-01-14 DIAGNOSIS — R293 Abnormal posture: Secondary | ICD-10-CM

## 2018-01-14 DIAGNOSIS — R2689 Other abnormalities of gait and mobility: Secondary | ICD-10-CM

## 2018-01-14 NOTE — Therapy (Signed)
[facility] [address], [place], [place], [address] Phone: [phone]   Fax:  [phone]  Physical Therapy Treatment  Patient Details  Name: Madison Odom MRN: [id number] Date of Birth: 12/24/1933 Referring Provider: Dorothyann Peng, NP   Encounter Date: 01/14/2018  PT End of Session - 01/14/18 1151    Visit Number  15    Number of Visits  16    Authorization Type  [employer]HC Medicare     Authorization Time Period  11/21/17 to 01/16/18    PT Start Time  1142    PT Stop Time  1221    PT Time Calculation (min)  39 min    Activity Tolerance  Patient tolerated treatment well;No increased pain    Behavior During Therapy  WFL for tasks assessed/performed       Past Medical History:  Diagnosis Date  . GERD (gastroesophageal reflux disease)   . Hyperlipidemia   . MVP (mitral valve prolapse)   . OP (osteoporosis)   . Pelvis fracture ([place]) 09/2015   CLOSED   . Scoliosis   . Spastic colon     Past Surgical History:  Procedure Laterality Date  . ABDOMINAL HYSTERECTOMY  1965  . APPENDECTOMY    . BILATERAL OOPHORECTOMY  1981  . EYE SURGERY    . ROTATOR CUFF REPAIR Right     There were no vitals filed for this visit.  Subjective Assessment - 01/14/18 1152    Subjective  A little sore after last session but not bad. I got a bottle of water open yesterday.     Pertinent History  LBP for several years especially standing in the kitchen. Most recent fall March 2018 picking up paper off of the kitchen floor. Felt like she couldn't walk around grocery store after the fall and then fell again onto daughter amb up curb from store. Denies vertigo. Uses the rollator at home. Lives alone. Has driver.     Limitations  Lifting;Other (comment)    Diagnostic tests  severe scoliosis; diagnostic testing March 2018    Patient Stated Goals  to be able to walk in the house without any help and use the cane outside    Currently in Pain?  Yes    Pain Score  2     Pain Location  Arm    Pain Orientation  Right;Mid    Pain Descriptors / Indicators  Sore    Multiple Pain Sites  No                      [facility] Adult PT Treatment/Exercise - 01/14/18 0001      Elbow Exercises   Elbow Flexion  -- Biceps 2# 2x10      Shoulder Exercises: Supine   External Rotation  AROM;Both 2x6    External Rotation Limitations  inclined mat x2 sets     ABduction  AROM;Strengthening;Both;10 reps    Other Supine Exercises  inclined cane chest press 6x Complaints of pain      Shoulder Exercises: Seated   Flexion  -- Cone stack ontop of eachother 4 cones 4x    Other Seated Exercises  1/2 foam roll at thoracic: 5x roll vertical 5x roll horizontal     Other Seated Exercises  UE ranger 10 x all planes      Shoulder Exercises: Pulleys   Flexion  -- 20x      Hand Exercises   Digiticizer  Green 10x2 Vc  for posture      Wrist Exercises   Other wrist exercises  wrist pronation/supination x20 reps holding end of 2# dumbbell  VC for posture    Other wrist exercises  wrist flexion stretches/prayinghands wrst extension stretches 5 x10 sec hold               PT Short Term Goals - 01/02/18 1159      PT SHORT TERM GOAL #1   Title  Pt will be independent with her initial HEP to improve shoulder ROM and UE strength.    Time  4    Period  Weeks    Status  Achieved        PT Long Term Goals - 01/02/18 1200      PT LONG TERM GOAL #1   Title  Pt will demo improved Rt shoulder elevation active ROM to atleast 90 deg which will assist with reaching overhead while fixing her hair in the mornings.    Baseline  Rt 80 deg, Lt 90 deg     Time  8    Period  Weeks    Status  Partially Met      PT LONG TERM GOAL #2   Title  Pt will demo improved shoulder strength to atleast 4/5 MMT which will increase her safety with lifting objects overhead into her kitchen cabinets.     Baseline  slight improvements     Time  8    Period  Weeks     Status  On-going      PT LONG TERM GOAL #3   Title  Pt will be able to open water bottles without assistance, to reflect an increase in her hand/forearm strength.     Baseline  Pt doesn't feel this has improved    Time  8    Period  Weeks    Status  On-going      PT LONG TERM GOAL #4   Title  Pts wrist/elbow strength will improve by atleast 1 MMT grade, to assist with daily chores and lifting small objects.    Time  8    Period  Weeks    Status  Achieved      PT LONG TERM GOAL #5   Title  Pt will report improved UE function by atleast 50% which will allow her to fix her hair, open jars with less difficulty.    Baseline  40%    Time  8    Period  Weeks    Status  Partially Met            Plan - 01/14/18 1151    Clinical Impression Statement  Pt was able to open one bottle of water the other day, this was the first time she could do this in a very long time. Pt does get sore in her arm and elbow when performing reaching exercises but this probably stems from using her arm with elbow as as ort of fulcrum which puts strain in it.     Rehab Potential  Good    Clinical Impairments Affecting Rehab Potential  osteoporosis, R RTC repair in the past     PT Frequency  2x / week    PT Duration  8 weeks    PT Treatment/Interventions  ADLs/Self Care Home Management;Cryotherapy;Electrical Stimulation;Ultrasound;Moist Heat;Iontophoresis 26m/ml Dexamethasone;Therapeutic activities;Therapeutic exercise;Patient/family education;Neuromuscular re-education;Manual techniques;Taping;Passive range of motion    PT Next Visit Plan  probable DC next visit to HEP    Consulted  and Agree with Plan of Care  Patient       Patient will benefit from skilled therapeutic intervention in order to improve the following deficits and impairments:  Impaired flexibility, Impaired UE functional use, Hypomobility, Decreased strength, Decreased range of motion, Decreased endurance, Decreased activity tolerance, Postural  dysfunction, Improper body mechanics, Decreased mobility  Visit Diagnosis: Abnormal posture  Muscle weakness (generalized)  Stiffness of right shoulder, not elsewhere classified  Stiffness of left shoulder, not elsewhere classified  Other abnormalities of gait and mobility  Chronic midline low back pain without sciatica     Problem List Patient Active Problem List   Diagnosis Date Noted  . Epiretinal membrane, left eye 04/18/2017  . Acute blood loss anemia 09/27/2015  . Pelvic fracture ([place]) 09/21/2015  . Closed fracture of pelvis ([place])   . Hypertensive retinopathy of both eyes 08/16/2015  . Hypertension 01/28/2014  . Kyphoscoliosis 09/16/2013  . After cataract 02/19/2013  . Pseudophakia of both eyes 01/22/2013  . Posterior capsular opacification 10/29/2012  . Cystoid macular edema 04/30/2012  . Epiretinal membrane 04/30/2012  . MVP (mitral valve prolapse) 05/24/2011  . Hypercholesterolemia 05/24/2011    Zaveon Gillen, PTA 01/14/2018, 12:22 PM  [facility] [address], [place], [place], [address] Phone: [phone]   Fax:  [phone]  Name: Madison Odom MRN: [id number] Date of Birth: 1934-07-13

## 2018-01-16 ENCOUNTER — Ambulatory Visit: Payer: Medicare Other | Admitting: Physical Therapy

## 2018-01-16 ENCOUNTER — Encounter: Payer: Self-pay | Admitting: Physical Therapy

## 2018-01-16 DIAGNOSIS — R293 Abnormal posture: Secondary | ICD-10-CM

## 2018-01-16 DIAGNOSIS — M25611 Stiffness of right shoulder, not elsewhere classified: Secondary | ICD-10-CM

## 2018-01-16 DIAGNOSIS — M25612 Stiffness of left shoulder, not elsewhere classified: Secondary | ICD-10-CM

## 2018-01-16 DIAGNOSIS — M6281 Muscle weakness (generalized): Secondary | ICD-10-CM

## 2018-01-16 NOTE — Therapy (Signed)
[facility] [address] [place], [place], [address] Phone: [phone]   Fax:  [phone]  Physical Therapy Treatment/Discharge  Patient Details  Name: Madison Odom MRN: [id number] Date of Birth: 1934-09-12 Referring Provider: Dorothyann Peng, NP   Encounter Date: 01/16/2018  PT End of Session - 01/16/18 1220    Visit Number  16    Number of Visits  16    Authorization Type  [employer] Medicare     Authorization Time Period  11/21/17 to 01/16/18    PT Start Time  1130    PT Stop Time  1214    PT Time Calculation (min)  44 min    Activity Tolerance  Patient tolerated treatment well;No increased pain    Behavior During Therapy  WFL for tasks assessed/performed       Past Medical History:  Diagnosis Date  . GERD (gastroesophageal reflux disease)   . Hyperlipidemia   . MVP (mitral valve prolapse)   . OP (osteoporosis)   . Pelvis fracture ([place]) 09/2015   CLOSED   . Scoliosis   . Spastic colon     Past Surgical History:  Procedure Laterality Date  . ABDOMINAL HYSTERECTOMY  1965  . APPENDECTOMY    . BILATERAL OOPHORECTOMY  1981  . EYE SURGERY    . ROTATOR CUFF REPAIR Right     There were no vitals filed for this visit.  Subjective Assessment - 01/16/18 1134    Subjective  Pt reports that reaching up is still very difficult for her. She is now able to open her water bottles with a gripper and writing checks has also gotten easier. She continues to have Rt shoulder pain following her exercises. She is using her pulleys daily.     Pertinent History  LBP for several years especially standing in the kitchen. Most recent fall March 2018 picking up paper off of the kitchen floor. Felt like she couldn't walk around grocery store after the fall and then fell again onto daughter amb up curb from store. Denies vertigo. Uses the rollator at home. Lives alone. Has driver.     Limitations  Lifting;Other (comment)    Diagnostic tests   severe scoliosis; diagnostic testing March 2018    Patient Stated Goals  to be able to walk in the house without any help and use the cane outside    Currently in Pain?  Yes    Pain Score  2     Pain Location  Arm    Pain Orientation  Right;Mid    Pain Descriptors / Indicators  Sore;Aching    Pain Type  Chronic pain    Pain Radiating Towards  none     Pain Onset  More than a month ago    Pain Frequency  Intermittent    Aggravating Factors   alot of exercise and movement    Pain Relieving Factors  meds, heat     Effect of Pain on Daily Activities  reaching top shelf          [facility] PT Assessment - 01/16/18 0001      Assessment   Medical Diagnosis  Muscle weakness/upper extremity weakness     Referring Provider  Dorothyann Peng, NP    Hand Dominance  Right    Next MD Visit  none until next year     Prior Therapy  last year for balance       Precautions   Precautions  None  Home Environment   Living Environment  Private residence    Additional Comments  caregiver that comes Monday and Wednesday for 4 hours to help with any chores/errands       Prior Function   Level of Independence  Independent with basic ADLs      AROM   Overall AROM Comments  Lt: 110 deg, Rt: 95 deg       Strength   Overall Strength Comments  grip strength: Rt 6 lb; Lt 15 lb    Right Shoulder Flexion  3/5    Right Shoulder ABduction  3/5    Right Shoulder Internal Rotation  3/5    Right Shoulder External Rotation  3/5    Left Shoulder Flexion  3+/5    Left Shoulder ABduction  3/5    Left Shoulder Internal Rotation  3/5    Left Shoulder External Rotation  3/5    Right Elbow Flexion  4/5    Right Elbow Extension  4-/5    Left Elbow Flexion  4/5    Left Elbow Extension  4/5         [facility] Adult PT Treatment/Exercise - 01/16/18 0001      Wrist Exercises   Other wrist exercises  wrist pronation/supination x20 reps holding end of 2# dumbbell              PT Education - 01/16/18 1214     Education provided  Yes    Education Details  Discussed progress and goals met; reviewed HEP and ways to progress HEP moving forward; encourage continued HEP adherence moving forward and provided ways to progress at home; recent plateau in progress that she can overcome if she continues to work on [employer] moving forward; importance of alternating HEP for the LE with the UE to give time for the arm to rest/recover between; use of ice after exercise to decrease inflammation/irritation    Person(s) Educated  Patient    Methods  Explanation;Handout    Comprehension  Verbalized understanding       PT Short Term Goals - 01/16/18 1147      PT SHORT TERM GOAL #1   Title  Pt will be independent with her initial HEP to improve shoulder ROM and UE strength.    Time  4    Period  Weeks    Status  Achieved        PT Long Term Goals - 01/16/18 1147      PT LONG TERM GOAL #1   Title  Pt will demo improved Rt shoulder elevation active ROM to atleast 90 deg which will assist with reaching overhead while fixing her hair in the mornings.    Baseline  Rt: 95 deg, Lt: 110    Time  8    Period  Weeks    Status  Achieved      PT LONG TERM GOAL #2   Title  Pt will demo improved shoulder strength to atleast 4/5 MMT which will increase her safety with lifting objects overhead into her kitchen cabinets.     Baseline  Mostly limited by pain.     Time  8    Period  Weeks    Status  Partially Met      PT LONG TERM GOAL #3   Title  Pt will be able to open water bottles without assistance, to reflect an increase in her hand/forearm strength.     Baseline  Pt able to open water bottles  Time  8    Period  Weeks    Status  Achieved      PT LONG TERM GOAL #4   Title  Pts wrist/elbow strength will improve by atleast 1 MMT grade, to assist with daily chores and lifting small objects.    Time  8    Period  Weeks    Status  Achieved      PT LONG TERM GOAL #5   Title  Pt will report improved UE function by  atleast 50% which will allow her to fix her hair, open jars with less difficulty.    Baseline  50-60% improved overall     Time  8    Period  Weeks    Status  Achieved            Plan - 01/16/18 1304    Clinical Impression Statement  Pt was discharged this visit having met all of her long term goals except one. She demonstrates improved Rt shoulder elevation ROM to 95 deg, and improved Lt shoulder elevation ROM to 110 deg. Her grip strength has improved Lt>Rt and she reports improvements in her writing and ability to open water bottles at home. She does continue to have limitations in shoulder strength, only slightly improved from the evaluation, and limitations in shoulder active/passive ROM. Pt has been consistently completing her HEP and has recently reached a plateau in her progress, largely limited by her posture and pain in the Rt shoulder. It is possible that she will continue to make progress with further completion of her exercises and use of modalities at home. She is independent with her advanced HEP and plans to continue with this at home. Therapist spent a large portion of today's session discussing HEP progressions, use of ice for pain control and goals met. Pt was agreeable with this.     Rehab Potential  Good    Clinical Impairments Affecting Rehab Potential  osteoporosis, R RTC repair in the past     PT Frequency  2x / week    PT Duration  8 weeks    PT Treatment/Interventions  ADLs/Self Care Home Management;Cryotherapy;Electrical Stimulation;Ultrasound;Moist Heat;Iontophoresis 71m/ml Dexamethasone;Therapeutic activities;Therapeutic exercise;Patient/family education;Neuromuscular re-education;Manual techniques;Taping;Passive range of motion    PT Next Visit Plan  d/c home with HEP    Consulted and Agree with Plan of Care  Patient       Patient will benefit from skilled therapeutic intervention in order to improve the following deficits and impairments:  Impaired flexibility,  Impaired UE functional use, Hypomobility, Decreased strength, Decreased range of motion, Decreased endurance, Decreased activity tolerance, Postural dysfunction, Improper body mechanics, Decreased mobility  Visit Diagnosis: Abnormal posture  Muscle weakness (generalized)  Stiffness of right shoulder, not elsewhere classified  Stiffness of left shoulder, not elsewhere classified     Problem List Patient Active Problem List   Diagnosis Date Noted  . Epiretinal membrane, left eye 04/18/2017  . Acute blood loss anemia 09/27/2015  . Pelvic fracture ([place] 09/21/2015  . Closed fracture of pelvis ([facility]   . Hypertensive retinopathy of both eyes 08/16/2015  . Hypertension 01/28/2014  . Kyphoscoliosis 09/16/2013  . After cataract 02/19/2013  . Pseudophakia of both eyes 01/22/2013  . Posterior capsular opacification 10/29/2012  . Cystoid macular edema 04/30/2012  . Epiretinal membrane 04/30/2012  . MVP (mitral valve prolapse) 05/24/2011  . Hypercholesterolemia 05/24/2011    PHYSICAL THERAPY DISCHARGE SUMMARY  Visits from Start of Care: 16  Current functional level related to goals /  functional outcomes: See above for more details    Remaining deficits: See above for more details    Education / Equipment: See above for more details   Plan: Patient agrees to discharge.  Patient goals were partially met. Patient is being discharged due to meeting the stated rehab goals.  ?????     1:09 PM,01/16/18 [place] [facility] [place]  [facility] [address], [place], [place], [address] Phone: [phone]   Fax:  [phone]  Name: Madison Odom MRN: [id number] Date of Birth: November 05, 1934

## 2018-01-16 NOTE — Patient Instructions (Signed)
   PULLEY FLEXION  Using door pulleys and facing away from the door, slowly pull down with your unaffected arm so that your affected arm raises forawrd and up without effort. Sit with towel behind you and lean back before raising the arm. Hold at the top for a stretch.  Your affected arm should be relaxed. The unaffected arm does the work. x10-15 reps, repeat again.         PUTTY GRIP  Place putty in your hand and squeeze it firmly and slowly. Reshape it and repeat.  Work on holding longer (5-10 sec)      Wrist Supination/Pronation  Start: Sit with your arm supported at 90 degrees with dumbbell in hand as pictured.  GO SLOW  Movement: (Pronation) Control the movement of turning your wrist so that your palm is faced down  (Supination) Control the movement of turning your wrist so that your palm is faced up  *Note-Control the movement, do not let the weight speed up the movement.    x10-15 reps, complete 2 sets on each arm.       Cane Active Shoulder Flexion in supine. You can complete this in a reclined position  Grasp the cane about shoulder width. Bring it up over your head, trying to stretch the affected shoulder, holding for a 2 count and then bring the cane back to the resting position.    x15 reps.       Scapular retraction (endind position)  Squeeze shoulder blades together, Hold 3 sec and repeat 15x.        TRUNK EXTENSION - TOWEL - AROM - MOBILIZATION  While sitting in a chair, extend your thoracic spine backwards over a rolled up towel against the back rest.  x15 reps, 2 sets.   [facility] [address], [address] [place], [place] [address] Phone # [phone] Fax [phone]

## 2018-01-17 ENCOUNTER — Ambulatory Visit: Payer: Medicare Other | Admitting: Cardiology

## 2018-01-31 ENCOUNTER — Ambulatory Visit: Payer: Medicare Other | Admitting: Cardiology

## 2018-01-31 ENCOUNTER — Encounter: Payer: Self-pay | Admitting: Cardiology

## 2018-01-31 VITALS — BP 124/64 | HR 64 | Ht <= 58 in | Wt 79.8 lb

## 2018-01-31 DIAGNOSIS — I951 Orthostatic hypotension: Secondary | ICD-10-CM | POA: Diagnosis not present

## 2018-01-31 DIAGNOSIS — I10 Essential (primary) hypertension: Secondary | ICD-10-CM

## 2018-01-31 DIAGNOSIS — E782 Mixed hyperlipidemia: Secondary | ICD-10-CM

## 2018-01-31 DIAGNOSIS — I272 Pulmonary hypertension, unspecified: Secondary | ICD-10-CM

## 2018-01-31 NOTE — Patient Instructions (Signed)

## 2018-01-31 NOTE — Progress Notes (Signed)
Cardiology Office Note    Date:  01/31/2018   ID:  Cheryl Flashatsy A Quesada, DOB October 08, 1934, MRN [id number]  PCP:  Shirline FreesNafziger, Cory, NP  Cardiologist:  Dr Patty SermonsBrackbill --> Tobias AlexanderKatarina Bo Rogue, MD   Chief complain: 6 months follow-up  History of Present Illness:  Laquasia A Jed LimerickCross is a 84 y.o. femalewith h/o mitral valve prolapse and history of hypercholesterolemia. She has a history of scoliosis and osteoporosis.She has terrible osteoarthritis and had a fall on 09/21/15 causing closed fracture of pelvis that did not require surgery. She went to a rehabilitation facility but is now home. She had physical therapy at home. She is doing well. She is walking with a rolling walker. She is not having any cardiac symptoms from her mitral valve prolapse.   This is a first-time visit after transitioning from Dr. Patty SermonsBrackbill to me, she denies any chest pain or shortness of breath no orthopnea, paroxysmal nocturnal dyspnea, no lower extremity edema palpitations or syncope. She is compliant with her meds and has no side effects. She walks up to 4 hours in a mall with her daughter, no falls. She appears very frail. She walks with a cane now, but without a cane at home.   04/18/2017 - this is a 6 months follow-up, the patient continues to have shortness of breath on mild distances, and has fallen twice in the last 6 months. On one occasion she bent down to pick up a newspaper and when she was standing up she fell down. On the other occasion she tripped over curb side. No loss of consciousness. She walks with a walker and completed physical therapy that she enjoyed very much but was advised not to drive that lead to dependence on her relatives and depression as she is unable to do things independently. Her functional capacity slowly decline over last couple years but she still lives independently and would like to do more. She denies any chest pain, no lower extremity edema orthopnea or proximal nocturnal dyspnea. No  palpitations.  01/31/2018, the patient is coming after 9 months, she has been doing better, with improved shortness of breath and no recent falls. She has been walking with a walker because of significant scoliosis and prior falls but she is able to walk 2 miles a day. She has no lower extremity edema, no orthopnea or personal nocturnal dyspnea. She has lost 9 pounds since last year now 79 pounds.  Past Medical History:  Diagnosis Date  . GERD (gastroesophageal reflux disease)   . Hyperlipidemia   . MVP (mitral valve prolapse)   . OP (osteoporosis)   . Pelvis fracture ([facility]) 09/2015   CLOSED   . Scoliosis   . Spastic colon     Past Surgical History:  Procedure Laterality Date  . ABDOMINAL HYSTERECTOMY  1965  . APPENDECTOMY    . BILATERAL OOPHORECTOMY  1981  . EYE SURGERY    . ROTATOR CUFF REPAIR Right    Current Medications: Outpatient Medications Prior to Visit  Medication Sig Dispense Refill  . aspirin 81 MG EC tablet Take 81 mg by mouth daily.      . Calcium Carb-Cholecalciferol (CALCIUM 500 +D PO) Take 1 tablet by mouth daily.     Marland Kitchen. FLUZONE HIGH-DOSE 0.5 ML injection TO BE ADMINISTERED BY PHARMACIST FOR IMMUNIZATION  0  . Multiple Vitamin (MULTIVITAMIN) tablet Take 1 tablet by mouth daily.    . nadolol (CORGARD) 40 MG tablet Take 1 tablet (40 mg total) by mouth daily. 90 tablet 3  .  omeprazole (PRILOSEC) 20 MG capsule Take 1 capsule (20 mg total) by mouth daily. 30 capsule 11  . simvastatin (ZOCOR) 20 MG tablet Take 1 tablet (20 mg total) by mouth daily. 30 tablet 6   No facility-administered medications prior to visit.    Allergies:   Phenothiazines; Prochlorperazine; Penicillin v; Penicillins; Tetracycline; and Tetracyclines & related   Social History   Socioeconomic History  . Marital status: Widowed    Spouse name: Not on file  . Number of children: Not on file  . Years of education: Not on file  . Highest education level: Not on file  Occupational History  .  Not on file  Social Needs  . Financial resource strain: Not on file  . Food insecurity:    Worry: Not on file    Inability: Not on file  . Transportation needs:    Medical: Not on file    Non-medical: Not on file  Tobacco Use  . Smoking status: Never Smoker  . Smokeless tobacco: Never Used  Substance and Sexual Activity  . Alcohol use: No  . Drug use: No  . Sexual activity: Not on file  Lifestyle  . Physical activity:    Days per week: Not on file    Minutes per session: Not on file  . Stress: Not on file  Relationships  . Social connections:    Talks on phone: Not on file    Gets together: Not on file    Attends religious service: Not on file    Active member of club or organization: Not on file    Attends meetings of clubs or organizations: Not on file    Relationship status: Not on file  Other Topics Concern  . Not on file  Social History Narrative   1 year of college    Widow   Two daughters     Family History:  The patient's family history includes Heart attack in her mother; Heart disease in her father; Hypertension in her father and mother.   ROS:   Please see the history of present illness.    ROS All other systems reviewed and are negative.  PHYSICAL EXAM:   VS:  BP 124/64 (BP Location: Left Arm, Patient Position: Sitting, Cuff Size: Normal)   Pulse 64   Ht 4\' 10"  (1.473 m)   Wt 79 lb 12.8 oz (36.2 kg)   SpO2 97%   BMI 16.68 kg/m    GEN: Well nourished, well developed, in no acute distress  HEENT: normal  Neck: JVL , carotid bruits, or masses Cardiac: RRR; no murmurs, rubs, or gallops,no edema  Respiratory:  clear to auscultation bilaterally, normal work of breathing GI: soft, nontender, nondistended, + BS MS: no deformity or atrophy  Skin: warm and dry, no rash Neuro:  Alert and Oriented x 3, Strength and sensation are intact Psych: euthymic mood, full affect  Wt Readings from Last 3 Encounters:  01/31/18 79 lb 12.8 oz (36.2 kg)  11/29/17 80 lb  (36.3 kg)  04/18/17 89 lb (40.4 kg)    Studies/Labs Reviewed:   Recent Labs: 11/29/2017: ALT 14; BUN 27; Creatinine, Ser 0.51; Hemoglobin 11.9; Platelets 324.0; Potassium 4.2; Sodium 141; TSH 1.03   Lipid Panel    Component Value Date/Time   CHOL 178 11/29/2017 0925   TRIG 90.0 11/29/2017 0925   HDL 69.80 11/29/2017 0925   CHOLHDL 3 11/29/2017 0925   VLDL 18.0 11/29/2017 0925   LDLCALC 90 11/29/2017 0925   TTE: 09/2015 -  Left ventricle: The cavity size was normal. Wall thickness was  increased in a pattern of mild LVH. Systolic function was normal.  The estimated ejection fraction was in the range of 60% to 65%.  Wall motion was normal; there were no regional wall motion  abnormalities. Doppler parameters are consistent with abnormal  left ventricular relaxation (grade 1 diastolic dysfunction). - Aortic valve: There was trivial regurgitation. - Mitral valve: Prolapse cannot be excluded. No MR. - Tricuspid valve: There was moderate regurgitation. - Pulmonary arteries: PA peak pressure: 62 mm Hg (S).  EKG performed today 01/31/2018 shows normal sinus rhythm normal EKG, first-degree AV block is no longer present.  TTE: 07/2017 - Left ventricle: The cavity size was normal. There was mild focal   basal hypertrophy of the septum. Systolic function was normal.   The estimated ejection fraction was in the range of 55% to 60%.   Wall motion was normal; there were no regional wall motion   abnormalities. Doppler parameters are consistent with abnormal   left ventricular relaxation (grade 1 diastolic dysfunction).   Doppler parameters are consistent with high ventricular filling   pressure. - Aortic valve: There was mild regurgitation. - Mitral valve: There was mild regurgitation. - Left atrium: The atrium was moderately dilated. - Pulmonary arteries: Systolic pressure was mildly increased. PA   peak pressure: 35 mm Hg (S).  Impressions: - Normal LV systolic function; mild  diastolic dysfunction; elevated   LV filling pressure; mild AI; mild MR; moderate LAE; mild TR with   mildly elevated pulmonary pressure.    ASSESSMENT:    1. Essential hypertension   2. Pulmonary hypertension (HCC)   3. Mixed hyperlipidemia   4. Orthostatic hypotension     PLAN:  In order of problems listed above:  1. Pulmonary HTN - DOE is now improved, RVSP in 2016 was 62 mmHg now improved to 35 mmHg on echocardiogram in September 2018.Marland Kitchen Her LVEF is normal. She is able to walk 2 miles a day and has dizziness or falls.  2. Hypertension - controlled  3. Hyperlipidemia - on simvastatin - all lipids at goal in Jan 2018, Simvastatin is well tolerated.   Medication Adjustments/Labs and Tests Ordered: Current medicines are reviewed at length with the patient today.  Concerns regarding medicines are outlined above.  Medication changes, Labs and Tests ordered today are listed in the Patient Instructions below. Patient Instructions  Medication Instructions:   Your physician recommends that you continue on your current medications as directed. Please refer to the Current Medication list given to you today.    Follow-Up:  Your physician wants you to follow-up in: ONE YEAR WITH DR Johnell Comings will receive a reminder letter in the mail two months in advance. If you don't receive a letter, please call our office to schedule the follow-up appointment.        If you need a refill on your cardiac medications before your next appointment, please call your pharmacy.    Signed, Tobias Alexander, MD  01/31/2018 1:05 PM    [facility] [address] [place], [place], [place]  [address] Phone: [phone]; Fax: [phone]

## 2018-03-11 ENCOUNTER — Other Ambulatory Visit: Payer: Self-pay | Admitting: Cardiology

## 2018-03-11 DIAGNOSIS — I10 Essential (primary) hypertension: Secondary | ICD-10-CM

## 2018-03-11 DIAGNOSIS — I272 Pulmonary hypertension, unspecified: Secondary | ICD-10-CM

## 2018-05-01 ENCOUNTER — Other Ambulatory Visit: Payer: Self-pay | Admitting: Cardiology

## 2018-05-01 NOTE — Telephone Encounter (Signed)
Pt's pharmacy is requesting a refill on omeprazole. Would Dr. Nelson like to refill this medication? Please address 

## 2018-05-01 NOTE — Telephone Encounter (Signed)
Yes, please refill. Thanks!

## 2018-05-03 ENCOUNTER — Other Ambulatory Visit: Payer: Self-pay | Admitting: Cardiology

## 2018-12-02 ENCOUNTER — Telehealth: Payer: Self-pay | Admitting: Adult Health

## 2018-12-02 NOTE — Telephone Encounter (Signed)
Called the patient or someone answered the phone and I asked for the patient and they hung up the phone. I will send a mychart message to the patient to have them reschedule for their appointment tomorrow. The provider had a death in the family and will not be in the office.

## 2018-12-03 ENCOUNTER — Telehealth: Payer: Self-pay | Admitting: Adult Health

## 2018-12-03 ENCOUNTER — Encounter: Payer: Medicare Other | Admitting: Adult Health

## 2018-12-03 DIAGNOSIS — M6281 Muscle weakness (generalized): Secondary | ICD-10-CM

## 2018-12-03 DIAGNOSIS — R2681 Unsteadiness on feet: Secondary | ICD-10-CM

## 2018-12-03 NOTE — Telephone Encounter (Signed)
Patient states her balance has been off and is requesting more physical therapy.  [facility]

## 2018-12-10 NOTE — Telephone Encounter (Signed)
Ok for referral to PT for gait instability

## 2018-12-10 NOTE — Telephone Encounter (Signed)
Referral placed.  Nothing further needed.  

## 2018-12-18 ENCOUNTER — Ambulatory Visit: Payer: Medicare Other | Admitting: Physical Therapy

## 2018-12-18 ENCOUNTER — Other Ambulatory Visit: Payer: Self-pay

## 2018-12-18 ENCOUNTER — Ambulatory Visit: Payer: Medicare Other | Attending: Adult Health | Admitting: Physical Therapy

## 2018-12-18 ENCOUNTER — Encounter: Payer: Self-pay | Admitting: Physical Therapy

## 2018-12-18 DIAGNOSIS — Z9181 History of falling: Secondary | ICD-10-CM | POA: Diagnosis present

## 2018-12-18 DIAGNOSIS — M6281 Muscle weakness (generalized): Secondary | ICD-10-CM | POA: Diagnosis present

## 2018-12-18 DIAGNOSIS — R293 Abnormal posture: Secondary | ICD-10-CM

## 2018-12-18 NOTE — Therapy (Signed)
[facility] [address], [address] [place], [place], [address] Phone: [phone]   Fax:  [phone]  Physical Therapy Evaluation  Patient Details  Name: Madison Odom MRN: [id number] Date of Birth: November 03, 1934 Referring Provider (PT): Shirline FreesNafziger, Cory, NP   Encounter Date: 12/18/2018  PT End of Session - 12/18/18 1354    Visit Number  1    Number of Visits  10    Date for PT Re-Evaluation  02/12/19    Authorization Type  medicare    PT Start Time  1146    PT Stop Time  1225    PT Time Calculation (min)  39 min    Activity Tolerance  Patient tolerated treatment well;Patient limited by fatigue    Behavior During Therapy  [facility]WFL for tasks assessed/performed       Past Medical History:  Diagnosis Date  . GERD (gastroesophageal reflux disease)   . Hyperlipidemia   . MVP (mitral valve prolapse)   . OP (osteoporosis)   . Pelvis fracture (HCC) 09/2015   CLOSED   . Scoliosis   . Spastic colon     Past Surgical History:  Procedure Laterality Date  . ABDOMINAL HYSTERECTOMY  1965  . APPENDECTOMY    . BILATERAL OOPHORECTOMY  1981  . EYE SURGERY    . ROTATOR CUFF REPAIR Right     There were no vitals filed for this visit.   Subjective Assessment - 12/18/18 1150    Subjective  Pt states she slipped down onto the floor in December and was able to get up by getting over to the stairs.  I am off balance when moving around in the kitchen or moving around when only holding with one hand    Limitations  Walking;Standing;House hold activities   feeling off balance   Patient Stated Goals  feel more stable when doing things in the kitchen and be able to pick things up off the floor    Currently in Pain?  No/denies         [facility] - 12/18/18 0001      Assessment   Medical Diagnosis  M62.81 (ICD-10-CM) - Muscle weakness (generalized);R26.81 (ICD-10-CM) - Gait instability    Referring Provider (PT)  Nafziger, Kandee Keenory, NP     Onset Date/Surgical Date  --   ongoing   Prior Therapy  Yes, ongoing problem      Precautions   Precautions  Fall      Restrictions   Weight Bearing Restrictions  No      Balance Screen   Has the patient fallen in the past 6 months  Yes    How many times?  1    Has the patient had a decrease in activity level because of a fear of falling?   No    Is the patient reluctant to leave their home because of a fear of falling?   No      Home Environment   Living Environment  Private residence    Living Arrangements  Alone      Prior Function   Level of Independence  Independent   caregiver 2x/week for running errands     Cognition   Overall Cognitive Status  Within Functional Limits for tasks assessed      Posture/Postural Control   Posture/Postural Control  Postural limitations    Postural Limitations  scoliosis with excess thoracic kyphosis      ROM / Strength   AROM / PROM /  Strength  Strength      Strength   Overall Strength Comments  Lt LE4-/5; Rt LE 4+/5      Standardized Balance Assessment   Standardized Balance Assessment  Berg Balance Test;Five Times Sit to Stand;Timed Up and Go Test    Five times sit to stand comments   25 sec with UE on rollator      Berg Balance Test   Sit to Stand  Able to stand without using hands and stabilize independently    Standing Unsupported  Able to stand safely 2 minutes    Sitting with Back Unsupported but Feet Supported on Floor or Stool  Able to sit safely and securely 2 minutes    Stand to Sit  Sits safely with minimal use of hands    Transfers  Able to transfer safely, definite need of hands    Standing Unsupported with Eyes Closed  Able to stand 10 seconds with supervision    Standing Ubsupported with Feet Together  Able to place feet together independently and stand for 1 minute with supervision    From Standing, Reach Forward with Outstretched Arm  Loses balance while trying/requires external support    From Standing  Position, Pick up Object from Floor  Unable to pick up and needs supervision    From Standing Position, Turn to Look Behind Over each Shoulder  Turn sideways only but maintains balance    Turn 360 Degrees  Needs close supervision or verbal cueing    Standing Unsupported, Alternately Place Feet on Step/Stool  Able to complete >2 steps/needs minimal assist    Standing Unsupported, One Foot in Front  Needs help to step but can hold 15 seconds   left foot in front only held 20 sec with small step   Standing on One Leg  Tries to lift leg/unable to hold 3 seconds but remains standing independently    Total Score  32      Timed Up and Go Test   Normal TUG (seconds)  --   36, 28, 31      Rollator with small step length          Objective measurements completed on examination: See above findings.                PT Short Term Goals - 12/19/18 1245      PT SHORT TERM GOAL #1   Title  TUG < or = to 27 seconds    Time  4    Period  Weeks    Status  New    Target Date  01/15/19      PT SHORT TERM GOAL #2   Title  berg balance score >/= 36/56     Baseline  32/56    Time  4    Period  Weeks    Status  New    Target Date  01/15/19      PT SHORT TERM GOAL #3   Title  able to transfer without min UE support x1 in order to safely navigate home and do functional tasks    Baseline  needs to touch something in both hands to feel safe    Time  4    Period  Weeks    Status  New    Target Date  01/15/19        PT Long Term Goals - 12/19/18 1236      PT LONG TERM GOAL #1   Title  pt  will be ind with advanced HEP    Time  8    Period  Weeks    Status  New    Target Date  02/12/19      PT LONG TERM GOAL #2   Title  Pt will be able to perform TUG < or = to 22 sec for reduced risk of falls    Time  8    Period  Weeks    Status  New    Target Date  02/12/19      PT LONG TERM GOAL #3   Title  Pt will be able to perform 5x sit to stand < or = to 20 sec with min UE  use for reduced risk of falls    Baseline  25 sec and needs to pull on rollator    Time  8    Period  Weeks    Status  New    Target Date  02/12/19      PT LONG TERM GOAL #4   Title  Pt will have Berg balance score of at least 41/56 due to improved balance    Baseline  32/56    Time  8    Period  Weeks    Status  New    Target Date  02/12/19      PT LONG TERM GOAL #5   Title  pt will be able to stand at her counter for at least 5 minutes to prep food without any UE support    Time  8    Period  Weeks    Status  New    Target Date  02/12/19             Plan - 12/19/18 1225    Clinical Impression Statement  Pt presents to clinic due to feeling less stable and not able to do things around her home without holding onto something with 2 hands.  Pt had a fall in December.  Pt has posture and gait abnormalties as mentioned above.  She has LE weakness Lt >Rt.  Based on balance assessments she is at high fall risk.  She will benefit from skilled PT to work on strength and balance in order to reduce risk of falls and improve function for household activities.    History and Personal Factors relevant to plan of care:  history of falls    Clinical Presentation  Evolving    Clinical Presentation due to:  balance has worsened    Clinical Decision Making  Low    Rehab Potential  Good    Clinical Impairments Affecting Rehab Potential  scoliosis    PT Frequency  2x / week    PT Duration  8 weeks    PT Treatment/Interventions  Manual techniques;Patient/family education;Therapeutic exercise;Balance training;Neuromuscular re-education;Therapeutic activities;ADLs/Self Care Home Management;Cryotherapy;Gait training;Moist Heat    PT Next Visit Plan  initiate HEP, strength, balance, endurance activities    Recommended Other Services  eval 12/18/18    Consulted and Agree with Plan of Care  Patient       Patient will benefit from skilled therapeutic intervention in order to improve the following  deficits and impairments:  Abnormal gait, Decreased endurance, Decreased strength, Decreased balance  Visit Diagnosis: Abnormal posture  Muscle weakness (generalized)  History of falling     Problem List Patient Active Problem List   Diagnosis Date Noted  . Epiretinal membrane, left eye 04/18/2017  . Acute blood loss anemia 09/27/2015  . Pelvic  fracture (HCC) 09/21/2015  . Closed fracture of pelvis (HCC)   . Hypertensive retinopathy of both eyes 08/16/2015  . Hypertension 01/28/2014  . Kyphoscoliosis 09/16/2013  . After cataract 02/19/2013  . Pseudophakia of both eyes 01/22/2013  . Posterior capsular opacification 10/29/2012  . Cystoid macular edema 04/30/2012  . Epiretinal membrane 04/30/2012  . MVP (mitral valve prolapse) 05/24/2011  . Hypercholesterolemia 05/24/2011    Vincente Poli, PT 12/19/2018, 12:49 PM  [facility] [address] [place], [place], [address] Phone: [phone]   Fax:  [phone]  Name: Madison Odom MRN: [id number] Date of Birth: Apr 16, 1934

## 2018-12-23 ENCOUNTER — Encounter: Payer: Self-pay | Admitting: Physical Therapy

## 2018-12-23 ENCOUNTER — Ambulatory Visit: Payer: Medicare Other | Admitting: Physical Therapy

## 2018-12-23 DIAGNOSIS — M6281 Muscle weakness (generalized): Secondary | ICD-10-CM

## 2018-12-23 DIAGNOSIS — Z9181 History of falling: Secondary | ICD-10-CM

## 2018-12-23 DIAGNOSIS — R293 Abnormal posture: Secondary | ICD-10-CM | POA: Diagnosis not present

## 2018-12-23 NOTE — Therapy (Signed)
[facility] [address], [address] [place], [place], [address] Phone: [phone]   Fax:  [phone]  Physical Therapy Treatment  Patient Details  Name: Madison Odom A Stepney MRN: [id number] Date of Birth: 03/25/1934 Referring Provider (PT): Shirline FreesNafziger, Cory, NP   Encounter Date: 12/23/2018  PT End of Session - 12/23/18 1358    Visit Number  2    Number of Visits  10    Date for PT Re-Evaluation  02/12/19    Authorization Type  medicare    PT Start Time  1358    PT Stop Time  1439    PT Time Calculation (min)  41 min    Activity Tolerance  Patient tolerated treatment well    Behavior During Therapy  [facility] for tasks assessed/performed       Past Medical History:  Diagnosis Date  . GERD (gastroesophageal reflux disease)   . Hyperlipidemia   . MVP (mitral valve prolapse)   . OP (osteoporosis)   . Pelvis fracture (HCC) 09/2015   CLOSED   . Scoliosis   . Spastic colon     Past Surgical History:  Procedure Laterality Date  . ABDOMINAL HYSTERECTOMY  1965  . APPENDECTOMY    . BILATERAL OOPHORECTOMY  1981  . EYE SURGERY    . ROTATOR CUFF REPAIR Right     There were no vitals filed for this visit.  Subjective Assessment - 12/23/18 1403    Subjective  Pt reports she is doing well today.    Currently in Pain?  No/denies                       [facility] Treatment/Exercise - 12/23/18 0001      Ambulation/Gait   Ambulation/Gait  Yes    Ambulation/Gait Assistance  6: Modified independent (Device/Increase time)    Ambulation Distance (Feet)  200 Feet    Assistive device  4-wheeled walker    Gait Comments  cued to perform head turn      Exercises   Exercises  Knee/Hip      Knee/Hip Exercises: Aerobic   Nustep  seat 8 L1 x 4 minutes   PT present to monitor     Knee/Hip Exercises: Standing   Heel Raises  Right;Left;20 reps   heel to toe; UE support x 2   Hip ADduction  Strengthening;Right;Left;20 reps    yellow band around ankles   Other Standing Knee Exercises  side stepping at the counter    Other Standing Knee Exercises  reaching to cabinet min to no UE support with other hand - 5 cones up and back x2; standing and pivot turns to each side 4x      Knee/Hip Exercises: Seated   Long Arc Quad  Strengthening;Right;Left;2 sets;10 reps    Long Arc Quad Weight  --   1.5 lb   Clamshell with TheraBand  Red   20x   Sit to [employer]Sand  2 sets;5 reps               PT Short Term Goals - 12/19/18 1245      PT SHORT TERM GOAL #1   Title  TUG < or = to 27 seconds    Time  4    Period  Weeks    Status  New    Target Date  01/15/19      PT SHORT TERM GOAL #2   Title  berg balance score >/= 36/56  Baseline  32/56    Time  4    Period  Weeks    Status  New    Target Date  01/15/19      PT SHORT TERM GOAL #3   Title  able to transfer without min UE support x1 in order to safely navigate home and do functional tasks    Baseline  needs to touch something in both hands to feel safe    Time  4    Period  Weeks    Status  New    Target Date  01/15/19        PT Long Term Goals - 12/19/18 1236      PT LONG TERM GOAL #1   Title  pt will be ind with advanced HEP    Time  8    Period  Weeks    Status  New    Target Date  02/12/19      PT LONG TERM GOAL #2   Title  Pt will be able to perform TUG < or = to 22 sec for reduced risk of falls    Time  8    Period  Weeks    Status  New    Target Date  02/12/19      PT LONG TERM GOAL #3   Title  Pt will be able to perform 5x sit to stand < or = to 20 sec with min UE use for reduced risk of falls    Baseline  25 sec and needs to pull on rollator    Time  8    Period  Weeks    Status  New    Target Date  02/12/19      PT LONG TERM GOAL #4   Title  Pt will have Berg balance score of at least 41/56 due to improved balance    Baseline  32/56    Time  8    Period  Weeks    Status  New    Target Date  02/12/19      PT LONG TERM  GOAL #5   Title  pt will be able to stand at her counter for at least 5 minutes to prep food without any UE support    Time  8    Period  Weeks    Status  New    Target Date  02/12/19            Plan - 12/23/18 1439    Clinical Impression Statement  Pt was fatigued with exercises today, but she was able to perform everything that was asked of her.  Pt needed cues to stand taller and use wider BOS when balancing.      PT Treatment/Interventions  Manual techniques;Patient/family education;Therapeutic exercise;Balance training;Neuromuscular re-education;Therapeutic activities;ADLs/Self Care Home Management;Cryotherapy;Gait training;Moist Heat    PT Next Visit Plan  initiate HEP, strength, balance, endurance activities    Consulted and Agree with Plan of Care  Patient       Patient will benefit from skilled therapeutic intervention in order to improve the following deficits and impairments:  Abnormal gait, Decreased endurance, Decreased strength, Decreased balance  Visit Diagnosis: Abnormal posture  Muscle weakness (generalized)  History of falling     Problem List Patient Active Problem List   Diagnosis Date Noted  . Epiretinal membrane, left eye 04/18/2017  . Acute blood loss anemia 09/27/2015  . Pelvic fracture (HCC) 09/21/2015  . Closed fracture of pelvis (HCC)   .  Hypertensive retinopathy of both eyes 08/16/2015  . Hypertension 01/28/2014  . Kyphoscoliosis 09/16/2013  . After cataract 02/19/2013  . Pseudophakia of both eyes 01/22/2013  . Posterior capsular opacification 10/29/2012  . Cystoid macular edema 04/30/2012  . Epiretinal membrane 04/30/2012  . MVP (mitral valve prolapse) 05/24/2011  . Hypercholesterolemia 05/24/2011    Vincente Poli, PT 12/23/2018, 2:42 PM  [facility] [address], [address] [place], [place], [address] Phone: [phone]   Fax:  [phone]  Name: KASHARI KRYSINSKI MRN:  [id number] Date of Birth: 13-Dec-1933

## 2018-12-25 ENCOUNTER — Ambulatory Visit: Payer: Medicare Other | Admitting: Physical Therapy

## 2018-12-25 DIAGNOSIS — Z9181 History of falling: Secondary | ICD-10-CM

## 2018-12-25 DIAGNOSIS — R293 Abnormal posture: Secondary | ICD-10-CM

## 2018-12-25 DIAGNOSIS — M6281 Muscle weakness (generalized): Secondary | ICD-10-CM

## 2018-12-25 NOTE — Therapy (Signed)
[facility] [address], [address] [place], [place], [address] Phone: [phone]   Fax:  [phone]  Physical Therapy Treatment  Patient Details  Name: Madison Odom MRN: [id number] Date of Birth: 08-17-1934 Referring Provider (PT): Shirline FreesNafziger, Cory, NP   Encounter Date: 12/25/2018  PT End of Session - 12/25/18 1149    Visit Number  3    Number of Visits  10    Date for PT Re-Evaluation  02/12/19    Authorization Type  medicare    PT Start Time  1147    PT Stop Time  1223    PT Time Calculation (min)  36 min    Activity Tolerance  Patient tolerated treatment well    Behavior During Therapy  [facility] for tasks assessed/performed       Past Medical History:  Diagnosis Date  . GERD (gastroesophageal reflux disease)   . Hyperlipidemia   . MVP (mitral valve prolapse)   . OP (osteoporosis)   . Pelvis fracture (HCC) 09/2015   CLOSED   . Scoliosis   . Spastic colon     Past Surgical History:  Procedure Laterality Date  . ABDOMINAL HYSTERECTOMY  1965  . APPENDECTOMY    . BILATERAL OOPHORECTOMY  1981  . EYE SURGERY    . ROTATOR CUFF REPAIR Right     There were no vitals filed for this visit.                    [facility] Adult PT Treatment/Exercise - 12/25/18 0001      Neuro Re-ed    Neuro Re-ed Details   Standing at sink to wash hands      Knee/Hip Exercises: Aerobic   Nustep  seat 8 L1 x 6 minutes   PT present to monitor     Knee/Hip Exercises: Standing   Heel Raises  Both;1 set;15 reps    Hip Abduction  Stengthening;Both;1 set;10 reps;Knee straight   1.5 # added   Other Standing Knee Exercises  side stepping at the counter    Other Standing Knee Exercises  reaching to cabinet min to no UE support with other hand - 5 cones up and back x2; standing and pivot turns to each side 4x      Knee/Hip Exercises: Seated   Long Arc Quad  Strengthening;Both;2 sets;10 reps;Weights    Long Arc Quad Weight  2  lbs.    Ball Squeeze  20x    Clamshell with TheraBand  Red   25x   Marching  Strengthening;Both;1 set;20 reps;Weights    Marching Weights  2 lbs.    Sit to Sand  2 sets;5 reps;without UE support   kept 5 reps for creptius in knee ( audible)      Ankle Exercises: Seated   Other Seated Ankle Exercises  Ball bt feet for inversion isometrics 10x     Other Seated Ankle Exercises  Rocker board 20x               PT Short Term Goals - 12/19/18 1245      PT SHORT TERM GOAL #1   Title  TUG < or = to 27 seconds    Time  4    Period  Weeks    Status  New    Target Date  01/15/19      PT SHORT TERM GOAL #2   Title  berg balance score >/= 36/56     Baseline  32/56  Time  4    Period  Weeks    Status  New    Target Date  01/15/19      PT SHORT TERM GOAL #3   Title  able to transfer without min UE support x1 in order to safely navigate home and do functional tasks    Baseline  needs to touch something in both hands to feel safe    Time  4    Period  Weeks    Status  New    Target Date  01/15/19        PT Long Term Goals - 12/19/18 1236      PT LONG TERM GOAL #1   Title  pt will be ind with advanced HEP    Time  8    Period  Weeks    Status  New    Target Date  02/12/19      PT LONG TERM GOAL #2   Title  Pt will be able to perform TUG < or = to 22 sec for reduced risk of falls    Time  8    Period  Weeks    Status  New    Target Date  02/12/19      PT LONG TERM GOAL #3   Title  Pt will be able to perform 5x sit to stand < or = to 20 sec with min UE use for reduced risk of falls    Baseline  25 sec and needs to pull on rollator    Time  8    Period  Weeks    Status  New    Target Date  02/12/19      PT LONG TERM GOAL #4   Title  Pt will have Berg balance score of at least 41/56 due to improved balance    Baseline  32/56    Time  8    Period  Weeks    Status  New    Target Date  02/12/19      PT LONG TERM GOAL #5   Title  pt will be able to stand at  her counter for at least 5 minutes to prep food without any UE support    Time  8    Period  Weeks    Status  New    Target Date  02/12/19            Plan - 12/25/18 1149    Clinical Impression Statement  Pt arrived today with reports of feeling less tired during her day. She increased her resistance for all LE strengthening exercises and showed improved postural awareness when walking.   We will keep Nustep to 4 min per pt's cardiac history.    Rehab Potential  Good    Clinical Impairments Affecting Rehab Potential  scoliosis    PT Frequency  2x / week    PT Duration  8 weeks    PT Treatment/Interventions  Manual techniques;Patient/family education;Therapeutic exercise;Balance training;Neuromuscular re-education;Therapeutic activities;ADLs/Self Care Home Management;Cryotherapy;Gait training;Moist Heat    PT Next Visit Plan  initiate HEP, strength, balance, endurance activities    Consulted and Agree with Plan of Care  Patient       Patient will benefit from skilled therapeutic intervention in order to improve the following deficits and impairments:  Abnormal gait, Decreased endurance, Decreased strength, Decreased balance  Visit Diagnosis: Abnormal posture  Muscle weakness (generalized)  History of falling     Problem List Patient  Active Problem List   Diagnosis Date Noted  . Epiretinal membrane, left eye 04/18/2017  . Acute blood loss anemia 09/27/2015  . Pelvic fracture (HCC) 09/21/2015  . Closed fracture of pelvis (HCC)   . Hypertensive retinopathy of both eyes 08/16/2015  . Hypertension 01/28/2014  . Kyphoscoliosis 09/16/2013  . After cataract 02/19/2013  . Pseudophakia of both eyes 01/22/2013  . Posterior capsular opacification 10/29/2012  . Cystoid macular edema 04/30/2012  . Epiretinal membrane 04/30/2012  . MVP (mitral valve prolapse) 05/24/2011  . Hypercholesterolemia 05/24/2011    Drago Hammonds, PTA 12/25/2018, 12:19 PM  [facility] [address], [address] [place], [place], [address] Phone: [phone]   Fax:  [phone]  Name: Madison Flashatsy A Koehn MRN: [id number] Date of Birth: June 30, 1934

## 2018-12-30 ENCOUNTER — Encounter: Payer: Self-pay | Admitting: Physical Therapy

## 2018-12-30 ENCOUNTER — Ambulatory Visit: Payer: Medicare Other | Admitting: Physical Therapy

## 2018-12-30 DIAGNOSIS — Z9181 History of falling: Secondary | ICD-10-CM

## 2018-12-30 DIAGNOSIS — M6281 Muscle weakness (generalized): Secondary | ICD-10-CM

## 2018-12-30 DIAGNOSIS — R293 Abnormal posture: Secondary | ICD-10-CM | POA: Diagnosis not present

## 2018-12-30 NOTE — Therapy (Signed)
[facility] [address] [place], [place], [address] Phone: [phone]   Fax:  [phone]  Physical Therapy Treatment  Patient Details  Name: REVAE Odom MRN: [id number] Date of Birth: 1933/12/18 Referring Provider (PT): [name], NP   Encounter Date: 12/30/2018  PT End of Session - 12/30/18 1141    Visit Number  4    Number of Visits  10    Date for PT Re-Evaluation  02/12/19    Authorization Type  medicare    PT Start Time  1140    PT Stop Time  1222    PT Time Calculation (min)  42 min    Activity Tolerance  Patient tolerated treatment well    Behavior During Therapy  [facility] for tasks assessed/performed       Past Medical History:  Diagnosis Date  . GERD (gastroesophageal reflux disease)   . Hyperlipidemia   . MVP (mitral valve prolapse)   . OP (osteoporosis)   . Pelvis fracture (HCC) 09/2015   CLOSED   . Scoliosis   . Spastic colon     Past Surgical History:  Procedure Laterality Date  . ABDOMINAL HYSTERECTOMY  1965  . APPENDECTOMY    . BILATERAL OOPHORECTOMY  1981  . EYE SURGERY    . ROTATOR CUFF REPAIR Right     There were no vitals filed for this visit.  Subjective Assessment - 12/30/18 1143    Subjective  No complaints this AM. My confidence is approving.     Currently in Pain?  No/denies    Multiple Pain Sites  No         [facility] PT Assessment - 12/30/18 0001      Timed Up and Go Test   Normal TUG (seconds)  --   29, 26, 23                  [facility] Adult PT Treatment/Exercise - 12/30/18 0001      Neuro Re-ed    Neuro Re-ed Details   Standing at sink to wash hands, heel to toes walking next to sink 4x light CGA,    Alternating toe taps 1 hand 2x10 CGA     Knee/Hip Exercises: Aerobic   Nustep  seat 8 L1 x 4 minutes   PTA present to monitor     Knee/Hip Exercises: Standing   Heel Raises  Both;1 set;20 reps    Hip Abduction  Stengthening;Both;2 sets;10  reps;Knee straight   2# added     Knee/Hip Exercises: Seated   Long Arc Quad  Strengthening;Both;2 sets;10 reps;Weights    Long Arc Quad Weight  2 lbs.    Ball Squeeze  25x    Clamshell with TheraBand  Red   25x   Marching  Strengthening;Both;1 set;20 reps;Weights    Marching Weights  2 lbs.    Sit to Sand  2 sets;5 reps;without UE support   kept 5 reps for creptius in knee ( audible)               PT Short Term Goals - 12/19/18 1245      PT SHORT TERM GOAL #1   Title  TUG < or = to 27 seconds    Time  4    Period  Weeks    Status  New    Target Date  01/15/19      PT SHORT TERM GOAL #2   Title  berg balance score >/= 36/56  Baseline  32/56    Time  4    Period  Weeks    Status  New    Target Date  01/15/19      PT SHORT TERM GOAL #3   Title  able to transfer without min UE support x1 in order to safely navigate home and do functional tasks    Baseline  needs to touch something in both hands to feel safe    Time  4    Period  Weeks    Status  New    Target Date  01/15/19        PT Long Term Goals - 12/19/18 1236      PT LONG TERM GOAL #1   Title  pt will be ind with advanced HEP    Time  8    Period  Weeks    Status  New    Target Date  02/12/19      PT LONG TERM GOAL #2   Title  Pt will be able to perform TUG < or = to 22 sec for reduced risk of falls    Time  8    Period  Weeks    Status  New    Target Date  02/12/19      PT LONG TERM GOAL #3   Title  Pt will be able to perform 5x sit to stand < or = to 20 sec with min UE use for reduced risk of falls    Baseline  25 sec and needs to pull on rollator    Time  8    Period  Weeks    Status  New    Target Date  02/12/19      PT LONG TERM GOAL #4   Title  Pt will have Berg balance score of at least 41/56 due to improved balance    Baseline  32/56    Time  8    Period  Weeks    Status  New    Target Date  02/12/19      PT LONG TERM GOAL #5   Title  pt will be able to stand at her  counter for at least 5 minutes to prep food without any UE support    Time  8    Period  Weeks    Status  New    Target Date  02/12/19            Plan - 12/30/18 1142    Clinical Impression Statement  Pt arrives to therapy today with reports of her confidence improving regarding her balance. TUG score (3x) improved today, meeting STG.    Rehab Potential  Good    Clinical Impairments Affecting Rehab Potential  scoliosis    PT Frequency  2x / week    PT Duration  8 weeks    PT Treatment/Interventions  Manual techniques;Patient/family education;Therapeutic exercise;Balance training;Neuromuscular re-education;Therapeutic activities;ADLs/Self Care Home Management;Cryotherapy;Gait training;Moist Heat    PT Next Visit Plan  initiate HEP, strength, balance, endurance activities    Consulted and Agree with Plan of Care  Patient       Patient will benefit from skilled therapeutic intervention in order to improve the following deficits and impairments:  Abnormal gait, Decreased endurance, Decreased strength, Decreased balance  Visit Diagnosis: Abnormal posture  Muscle weakness (generalized)  History of falling     Problem List Patient Active Problem List   Diagnosis Date Noted  . Epiretinal membrane, left eye  04/18/2017  . Acute blood loss anemia 09/27/2015  . Pelvic fracture (HCC) 09/21/2015  . Closed fracture of pelvis (HCC)   . Hypertensive retinopathy of both eyes 08/16/2015  . Hypertension 01/28/2014  . Kyphoscoliosis 09/16/2013  . After cataract 02/19/2013  . Pseudophakia of both eyes 01/22/2013  . Posterior capsular opacification 10/29/2012  . Cystoid macular edema 04/30/2012  . Epiretinal membrane 04/30/2012  . MVP (mitral valve prolapse) 05/24/2011  . Hypercholesterolemia 05/24/2011    Madison Odom, PTA 12/30/2018, 1:15 PM  [facility] [address] [place], [place], [address] Phone:  [phone]   Fax:  [phone]  Name: Madison Odom MRN: [id number] Date of Birth: Oct 20, 1934

## 2019-01-01 ENCOUNTER — Ambulatory Visit: Payer: Medicare Other | Admitting: Physical Therapy

## 2019-01-01 ENCOUNTER — Encounter: Payer: Self-pay | Admitting: Physical Therapy

## 2019-01-01 DIAGNOSIS — M6281 Muscle weakness (generalized): Secondary | ICD-10-CM

## 2019-01-01 DIAGNOSIS — R293 Abnormal posture: Secondary | ICD-10-CM

## 2019-01-01 DIAGNOSIS — Z9181 History of falling: Secondary | ICD-10-CM

## 2019-01-01 NOTE — Therapy (Signed)
[facility] [address] [place], [place], [address] Phone: [phone]   Fax:  [phone]  Physical Therapy Treatment  Patient Details  Name: Madison Odom MRN: [id number] Date of Birth: November 01, 1934 Referring Provider (PT): Shirline FreesNafziger, Cory, NP   Encounter Date: 01/01/2019  PT End of Session - 01/01/19 1150    Visit Number  5    Number of Visits  10    Date for PT Re-Evaluation  02/12/19    Authorization Type  medicare    PT Start Time  1145    PT Stop Time  1225    PT Time Calculation (min)  40 min    Activity Tolerance  Patient tolerated treatment well    Behavior During Therapy  [facility] for tasks assessed/performed       Past Medical History:  Diagnosis Date  . GERD (gastroesophageal reflux disease)   . Hyperlipidemia   . MVP (mitral valve prolapse)   . OP (osteoporosis)   . Pelvis fracture (HCC) 09/2015   CLOSED   . Scoliosis   . Spastic colon     Past Surgical History:  Procedure Laterality Date  . ABDOMINAL HYSTERECTOMY  1965  . APPENDECTOMY    . BILATERAL OOPHORECTOMY  1981  . EYE SURGERY    . ROTATOR CUFF REPAIR Right     There were no vitals filed for this visit.  Subjective Assessment - 01/01/19 1151    Subjective  Feeling good, no complaints this AM.    Currently in Pain?  No/denies    Multiple Pain Sites  No                       [facility] Adult PT Treatment/Exercise - 01/01/19 0001      Neuro Re-ed    Neuro Re-ed Details   Transferring without holding on to objects, single leg stance, turning 360 degrees, alternating toe taps on stairs ( no UE today)    Side stepping at counter top 4x no UE, tandem walking 4x 1 h     Knee/Hip Exercises: Aerobic   Nustep  seat 8 L1 x 4 minutes   PTA present to monitor     Knee/Hip Exercises: Seated   Long Arc Quad  Strengthening;Both;2 sets;20 reps    Long Arc Quad Weight  2 lbs.    Ball Squeeze  25x    Clamshell with TheraBand   Green   20x   Marching  Strengthening;Both;2 sets;20 reps;Weights    Marching Weights  2 lbs.    Sit to Sand  --   Declined for knee pain/audible crepitus              PT Short Term Goals - 12/19/18 1245      PT SHORT TERM GOAL #1   Title  TUG < or = to 27 seconds    Time  4    Period  Weeks    Status  New    Target Date  01/15/19      PT SHORT TERM GOAL #2   Title  berg balance score >/= 36/56     Baseline  32/56    Time  4    Period  Weeks    Status  New    Target Date  01/15/19      PT SHORT TERM GOAL #3   Title  able to transfer without min UE support x1 in order to safely navigate home and  do functional tasks    Baseline  needs to touch something in both hands to feel safe    Time  4    Period  Weeks    Status  New    Target Date  01/15/19        PT Long Term Goals - 12/19/18 1236      PT LONG TERM GOAL #1   Title  pt will be ind with advanced HEP    Time  8    Period  Weeks    Status  New    Target Date  02/12/19      PT LONG TERM GOAL #2   Title  Pt will be able to perform TUG < or = to 22 sec for reduced risk of falls    Time  8    Period  Weeks    Status  New    Target Date  02/12/19      PT LONG TERM GOAL #3   Title  Pt will be able to perform 5x sit to stand < or = to 20 sec with min UE use for reduced risk of falls    Baseline  25 sec and needs to pull on rollator    Time  8    Period  Weeks    Status  New    Target Date  02/12/19      PT LONG TERM GOAL #4   Title  Pt will have Berg balance score of at least 41/56 due to improved balance    Baseline  32/56    Time  8    Period  Weeks    Status  New    Target Date  02/12/19      PT LONG TERM GOAL #5   Title  pt will be able to stand at her counter for at least 5 minutes to prep food without any UE support    Time  8    Period  Weeks    Status  New    Target Date  02/12/19            Plan - 01/01/19 1204    Clinical Impression Statement  Pt can single leg stance LT  better than RT for 10 seconds and very light support of her UE. She requires more UE support when balancing on the RTLE. She was able to alternate her toe taps 5x without holding on for support but with PTA CGA. We declined doing repetitve knee bending due to painful, audible crepitus.     Rehab Potential  Good    Clinical Impairments Affecting Rehab Potential  scoliosis    PT Frequency  2x / week    PT Treatment/Interventions  Manual techniques;Patient/family education;Therapeutic exercise;Balance training;Neuromuscular re-education;Therapeutic activities;ADLs/Self Care Home Management;Cryotherapy;Gait training;Moist Heat    PT Next Visit Plan  LE strength and balance    Consulted and Agree with Plan of Care  Patient       Patient will benefit from skilled therapeutic intervention in order to improve the following deficits and impairments:  Abnormal gait, Decreased endurance, Decreased strength, Decreased balance  Visit Diagnosis: Abnormal posture  History of falling  Muscle weakness (generalized)     Problem List Patient Active Problem List   Diagnosis Date Noted  . Epiretinal membrane, left eye 04/18/2017  . Acute blood loss anemia 09/27/2015  . Pelvic fracture (HCC) 09/21/2015  . Closed fracture of pelvis (HCC)   . Hypertensive retinopathy of both eyes  08/16/2015  . Hypertension 01/28/2014  . Kyphoscoliosis 09/16/2013  . After cataract 02/19/2013  . Pseudophakia of both eyes 01/22/2013  . Posterior capsular opacification 10/29/2012  . Cystoid macular edema 04/30/2012  . Epiretinal membrane 04/30/2012  . MVP (mitral valve prolapse) 05/24/2011  . Hypercholesterolemia 05/24/2011    [name], PTA 01/01/2019, 12:26 PM  [facility] [address] [place], [place], [address] Phone: [phone]   Fax:  [phone]  Name: Madison Odom MRN: [id number] Date of Birth: [birth year]

## 2019-01-06 ENCOUNTER — Ambulatory Visit: Payer: Medicare Other | Admitting: Physical Therapy

## 2019-01-06 DIAGNOSIS — I63411 Cerebral infarction due to embolism of right middle cerebral artery: Secondary | ICD-10-CM | POA: Diagnosis not present

## 2019-01-06 DIAGNOSIS — Z9181 History of falling: Secondary | ICD-10-CM

## 2019-01-06 DIAGNOSIS — R293 Abnormal posture: Secondary | ICD-10-CM

## 2019-01-06 DIAGNOSIS — M6281 Muscle weakness (generalized): Secondary | ICD-10-CM

## 2019-01-06 DIAGNOSIS — R262 Difficulty in walking, not elsewhere classified: Secondary | ICD-10-CM | POA: Diagnosis not present

## 2019-01-06 NOTE — Therapy (Signed)
[facility] [address] [name], [place], [address] Phone: [phone]   Fax:  [phone]  Physical Therapy Treatment  Patient Details  Name: Madison Odom MRN: [id number] Date of Birth: 08/17/1934 Referring Provider (PT): Dorothyann Peng, NP   Encounter Date: 01/06/2019  PT End of Session - 01/06/19 1230    Visit Number  6    Number of Visits  10    Date for PT Re-Evaluation  02/12/19    Authorization Type  medicare    PT Start Time  6503    PT Stop Time  1223    PT Time Calculation (min)  38 min    Activity Tolerance  Patient tolerated treatment well    Behavior During Therapy  [facility] for tasks assessed/performed       Past Medical History:  Diagnosis Date  . GERD (gastroesophageal reflux disease)   . Hyperlipidemia   . MVP (mitral valve prolapse)   . OP (osteoporosis)   . Pelvis fracture ([place]) 09/2015   CLOSED   . Scoliosis   . Spastic colon     Past Surgical History:  Procedure Laterality Date  . ABDOMINAL HYSTERECTOMY  1965  . APPENDECTOMY    . BILATERAL OOPHORECTOMY  1981  . EYE SURGERY    . ROTATOR CUFF REPAIR Right     There were no vitals filed for this visit.  Subjective Assessment - 01/06/19 1224    Subjective  Feeling about the same at home, I still need to hold on.  It is hard to do something with 2 hands, like mixing and holding a bowl    Patient Stated Goals  feel more stable when doing things in the kitchen and be able to pick things up off the floor    Currently in Pain?  No/denies         [facility] PT Assessment - 01/06/19 0001      Standardized Balance Assessment   Five times sit to stand comments   25 sec with UE x1 on chair      Timed Up and Go Test   Normal TUG (seconds)  --   28, 28, 26                  [facility] Adult PT Treatment/Exercise - 01/06/19 0001      Neuro Re-ed    Neuro Re-ed Details   side stepping, standing with single arm bicep curl 1lb, ext and  rotation yellow band, standing and stepping forward, back and side - no UE throughout these activities      Knee/Hip Exercises: Aerobic   Nustep  --      Knee/Hip Exercises: Standing   Heel Raises  Both;1 set;20 reps   only 1 UE support     Knee/Hip Exercises: Seated   Long Arc Quad  Strengthening;Both;2 sets;20 reps    Long Arc Quad Weight  2 lbs.    Ball Squeeze  30x    Clamshell with TheraBand  Green   20x   Marching  Strengthening;Both;2 sets;20 reps;Weights    Marching Weights  2 lbs.    Sit to Sand  5 reps;with UE support   crepitus only did 1x              PT Short Term Goals - 01/06/19 1228      PT SHORT TERM GOAL #1   Title  TUG < or = to 27 seconds    Status  On-going      PT SHORT TERM GOAL #2   Title  berg balance score >/= 36/56     Status  On-going      PT SHORT TERM GOAL #3   Title  able to transfer without min UE support x1 in order to safely navigate home and do functional tasks    Baseline  able to stand and step without UE support    Status  Partially Met        PT Long Term Goals - 01/06/19 1151      PT LONG TERM GOAL #1   Title  pt will be ind with advanced HEP    Status  On-going      PT LONG TERM GOAL #2   Title  Pt will be able to perform TUG < or = to 22 sec for reduced risk of falls    Status  On-going      PT LONG TERM GOAL #3   Title  Pt will be able to perform 5x sit to stand < or = to 20 sec with min UE use for reduced risk of falls    Baseline  25 sec using one UE    Status  On-going            Plan - 01/06/19 1225    Clinical Impression Statement  Pt did well with standing and stepping exercises without UE support.  She felt challenged but felt more secure with PT present.  Occasional CGA for safety but she had no loss of balance.  She feels more unsteady with wider BOS.  Pt will benefit from skilled PT to work on improved propriception and build confidence for doing household activities.    PT  Treatment/Interventions  Manual techniques;Patient/family education;Therapeutic exercise;Balance training;Neuromuscular re-education;Therapeutic activities;ADLs/Self Care Home Management;Cryotherapy;Gait training;Moist Heat    PT Next Visit Plan  LE strength and balance, Reissue BERG for goals    Consulted and Agree with Plan of Care  Patient       Patient will benefit from skilled therapeutic intervention in order to improve the following deficits and impairments:  Abnormal gait, Decreased endurance, Decreased strength, Decreased balance  Visit Diagnosis: Abnormal posture  History of falling  Muscle weakness (generalized)     Problem List Patient Active Problem List   Diagnosis Date Noted  . Epiretinal membrane, left eye 04/18/2017  . Acute blood loss anemia 09/27/2015  . Pelvic fracture ([place]) 09/21/2015  . Closed fracture of pelvis ([place])   . Hypertensive retinopathy of both eyes 08/16/2015  . Hypertension 01/28/2014  . Kyphoscoliosis 09/16/2013  . After cataract 02/19/2013  . Pseudophakia of both eyes 01/22/2013  . Posterior capsular opacification 10/29/2012  . Cystoid macular edema 04/30/2012  . Epiretinal membrane 04/30/2012  . MVP (mitral valve prolapse) 05/24/2011  . Hypercholesterolemia 05/24/2011    Zannie Cove, PT 01/06/2019, 12:31 PM  [facility] [address] [place], [place], [address] Phone: [phone]   Fax:  [phone]  Name: Madison Odom MRN: [id number] Date of Birth: June 05, 1934

## 2019-01-08 ENCOUNTER — Encounter: Payer: Self-pay | Admitting: Physical Therapy

## 2019-01-08 ENCOUNTER — Ambulatory Visit: Payer: Medicare Other | Admitting: Physical Therapy

## 2019-01-08 DIAGNOSIS — Z9181 History of falling: Secondary | ICD-10-CM

## 2019-01-08 DIAGNOSIS — M6281 Muscle weakness (generalized): Secondary | ICD-10-CM

## 2019-01-08 DIAGNOSIS — R293 Abnormal posture: Secondary | ICD-10-CM

## 2019-01-08 NOTE — Therapy (Addendum)
[facility] [address], [place], [place], [address] Phone: [phone]   Fax:  [phone]  Physical Therapy Treatment  Patient Details  Name: Madison Odom MRN: [id number] Date of Birth: [birth year] Referring Provider (PT): Dorothyann Peng, NP   Encounter Date: 01/08/2019  PT End of Session - 01/08/19 1149    Visit Number  7    Number of Visits  10    Date for PT Re-Evaluation  02/12/19    Authorization Type  medicare    PT Start Time  5830    PT Stop Time  1223    PT Time Calculation (min)  38 min    Activity Tolerance  Patient tolerated treatment well    Behavior During Therapy  [facility] for tasks assessed/performed       Past Medical History:  Diagnosis Date  . GERD (gastroesophageal reflux disease)   . Hyperlipidemia   . MVP (mitral valve prolapse)   . OP (osteoporosis)   . Pelvis fracture ([place]) 09/2015   CLOSED   . Scoliosis   . Spastic colon     Past Surgical History:  Procedure Laterality Date  . ABDOMINAL HYSTERECTOMY  1965  . APPENDECTOMY    . BILATERAL OOPHORECTOMY  1981  . EYE SURGERY    . ROTATOR CUFF REPAIR Right     There were no vitals filed for this visit.  Subjective Assessment - 01/08/19 1153    Subjective  No new complaints.    Currently in Pain?  No/denies    Multiple Pain Sites  No                       [facility] Adult PT Treatment/Exercise - 01/08/19 0001      Neuro Re-ed    Neuro Re-ed Details   standing at kitchen counter to perform a variety of counter top activities where pt had perfom the activity with both hands and maintain her balance.    side stepping no UE 4x, alt toe taps at step no UE 2x10 CGA      Knee/Hip Exercises: Aerobic   Nustep  L2 x 5 min   PTA present to discuss goals     Knee/Hip Exercises: Seated   Long Arc Quad  Strengthening;Both;2 sets;20 reps    Long Arc Quad Weight  2 lbs.    Ball Squeeze  30x    Clamshell with TheraBand  Green    25x   Marching  Strengthening;Both;2 sets;20 reps;Weights    Marching Weights  2 lbs.               PT Short Term Goals - 01/06/19 1228      PT SHORT TERM GOAL #1   Title  TUG < or = to 27 seconds    Status  On-going      PT SHORT TERM GOAL #2   Title  berg balance score >/= 36/56     Status  On-going      PT SHORT TERM GOAL #3   Title  able to transfer without min UE support x1 in order to safely navigate home and do functional tasks    Baseline  able to stand and step without UE support    Status  Partially Met        PT Long Term Goals - 01/06/19 1151      PT LONG TERM GOAL #1   Title  pt will be ind with  advanced HEP    Status  On-going      PT LONG TERM GOAL #2   Title  Pt will be able to perform TUG < or = to 22 sec for reduced risk of falls    Status  On-going      PT LONG TERM GOAL #3   Title  Pt will be able to perform 5x sit to stand < or = to 20 sec with min UE use for reduced risk of falls    Baseline  25 sec using one UE    Status  On-going            Plan - 01/08/19 1149    Clinical Impression Statement  Pt would like to be at her kitchen counter to perform activities like mixing/stirring without haveing to hold onto the counter. A variety of activities were performed at the counter today in PT with only SBA. Pt required CGA for activities that involve a small single leg stance moment.     Rehab Potential  Good    Clinical Impairments Affecting Rehab Potential  scoliosis    PT Frequency  2x / week    PT Duration  8 weeks    PT Treatment/Interventions  Manual techniques;Patient/family education;Therapeutic exercise;Balance training;Neuromuscular re-education;Therapeutic activities;ADLs/Self Care Home Management;Cryotherapy;Gait training;Moist Heat    PT Next Visit Plan  BERG next visit, out of time today    Consulted and Agree with Plan of Care  Patient       Patient will benefit from skilled therapeutic intervention in order to improve  the following deficits and impairments:  Abnormal gait, Decreased endurance, Decreased strength, Decreased balance  Visit Diagnosis: Abnormal posture  History of falling  Muscle weakness (generalized)     Problem List Patient Active Problem List   Diagnosis Date Noted  . Epiretinal membrane, left eye 04/18/2017  . Acute blood loss anemia 09/27/2015  . Pelvic fracture ([place]) 09/21/2015  . Closed fracture of pelvis ([place])   . Hypertensive retinopathy of both eyes 08/16/2015  . Hypertension 01/28/2014  . Kyphoscoliosis 09/16/2013  . After cataract 02/19/2013  . Pseudophakia of both eyes 01/22/2013  . Posterior capsular opacification 10/29/2012  . Cystoid macular edema 04/30/2012  . Epiretinal membrane 04/30/2012  . MVP (mitral valve prolapse) 05/24/2011  . Hypercholesterolemia 05/24/2011    COCHRAN,JENNIFER, PTA 01/08/2019, 12:23 PM  [facility] [address] [place], [place], [address] Phone: [phone]   Fax:  [phone]  Name: Madison Odom MRN: [id number] Date of Birth: 10-Oct-1934  PHYSICAL THERAPY DISCHARGE SUMMARY  Visits from Start of Care: 7  Current functional level related to goals / functional outcomes: See above details   Remaining deficits: See above   Education / Equipment: HEP  Plan: Patient agrees to discharge.  Patient goals were not met. Patient is being discharged due to a change in medical status.  ?????    [employer], PT 01/10/19 12:02 PM

## 2019-01-09 ENCOUNTER — Inpatient Hospital Stay (HOSPITAL_COMMUNITY)
Admission: EM | Admit: 2019-01-09 | Discharge: 2019-01-14 | DRG: 041 | Disposition: A | Discharge status: Rehabilitation Facility | Payer: Medicare Other | Attending: Internal Medicine | Admitting: Internal Medicine

## 2019-01-09 ENCOUNTER — Emergency Department (HOSPITAL_COMMUNITY): Payer: Medicare Other

## 2019-01-09 ENCOUNTER — Encounter (HOSPITAL_COMMUNITY): Payer: Self-pay

## 2019-01-09 DIAGNOSIS — R29700 NIHSS score 0: Secondary | ICD-10-CM | POA: Diagnosis present

## 2019-01-09 DIAGNOSIS — I63411 Cerebral infarction due to embolism of right middle cerebral artery: Secondary | ICD-10-CM | POA: Diagnosis present

## 2019-01-09 DIAGNOSIS — Z880 Allergy status to penicillin: Secondary | ICD-10-CM

## 2019-01-09 DIAGNOSIS — I10 Essential (primary) hypertension: Secondary | ICD-10-CM | POA: Diagnosis present

## 2019-01-09 DIAGNOSIS — I639 Cerebral infarction, unspecified: Secondary | ICD-10-CM | POA: Diagnosis not present

## 2019-01-09 DIAGNOSIS — M419 Scoliosis, unspecified: Secondary | ICD-10-CM | POA: Diagnosis present

## 2019-01-09 DIAGNOSIS — Z8249 Family history of ischemic heart disease and other diseases of the circulatory system: Secondary | ICD-10-CM

## 2019-01-09 DIAGNOSIS — D509 Iron deficiency anemia, unspecified: Secondary | ICD-10-CM | POA: Diagnosis present

## 2019-01-09 DIAGNOSIS — R7303 Prediabetes: Secondary | ICD-10-CM | POA: Diagnosis present

## 2019-01-09 DIAGNOSIS — Z79899 Other long term (current) drug therapy: Secondary | ICD-10-CM

## 2019-01-09 DIAGNOSIS — Z7982 Long term (current) use of aspirin: Secondary | ICD-10-CM

## 2019-01-09 DIAGNOSIS — R2689 Other abnormalities of gait and mobility: Secondary | ICD-10-CM | POA: Diagnosis present

## 2019-01-09 DIAGNOSIS — R531 Weakness: Secondary | ICD-10-CM | POA: Diagnosis not present

## 2019-01-09 DIAGNOSIS — R29701 NIHSS score 1: Secondary | ICD-10-CM | POA: Diagnosis not present

## 2019-01-09 DIAGNOSIS — R262 Difficulty in walking, not elsewhere classified: Secondary | ICD-10-CM | POA: Diagnosis present

## 2019-01-09 DIAGNOSIS — K589 Irritable bowel syndrome without diarrhea: Secondary | ICD-10-CM | POA: Diagnosis present

## 2019-01-09 DIAGNOSIS — H35039 Hypertensive retinopathy, unspecified eye: Secondary | ICD-10-CM | POA: Diagnosis present

## 2019-01-09 DIAGNOSIS — R414 Neurologic neglect syndrome: Secondary | ICD-10-CM

## 2019-01-09 DIAGNOSIS — Z888 Allergy status to other drugs, medicaments and biological substances status: Secondary | ICD-10-CM

## 2019-01-09 DIAGNOSIS — G4700 Insomnia, unspecified: Secondary | ICD-10-CM | POA: Diagnosis present

## 2019-01-09 DIAGNOSIS — Z881 Allergy status to other antibiotic agents status: Secondary | ICD-10-CM

## 2019-01-09 DIAGNOSIS — R9082 White matter disease, unspecified: Secondary | ICD-10-CM | POA: Diagnosis present

## 2019-01-09 DIAGNOSIS — I351 Nonrheumatic aortic (valve) insufficiency: Secondary | ICD-10-CM | POA: Diagnosis not present

## 2019-01-09 DIAGNOSIS — I080 Rheumatic disorders of both mitral and aortic valves: Secondary | ICD-10-CM | POA: Diagnosis present

## 2019-01-09 DIAGNOSIS — I340 Nonrheumatic mitral (valve) insufficiency: Secondary | ICD-10-CM | POA: Diagnosis not present

## 2019-01-09 DIAGNOSIS — M810 Age-related osteoporosis without current pathological fracture: Secondary | ICD-10-CM | POA: Diagnosis present

## 2019-01-09 DIAGNOSIS — K219 Gastro-esophageal reflux disease without esophagitis: Secondary | ICD-10-CM | POA: Diagnosis present

## 2019-01-09 DIAGNOSIS — E785 Hyperlipidemia, unspecified: Secondary | ICD-10-CM | POA: Diagnosis present

## 2019-01-09 DIAGNOSIS — R002 Palpitations: Secondary | ICD-10-CM | POA: Diagnosis not present

## 2019-01-09 DIAGNOSIS — Z90710 Acquired absence of both cervix and uterus: Secondary | ICD-10-CM

## 2019-01-09 DIAGNOSIS — I361 Nonrheumatic tricuspid (valve) insufficiency: Secondary | ICD-10-CM | POA: Diagnosis not present

## 2019-01-09 DIAGNOSIS — Z9181 History of falling: Secondary | ICD-10-CM | POA: Diagnosis not present

## 2019-01-09 DIAGNOSIS — R29702 NIHSS score 2: Secondary | ICD-10-CM | POA: Diagnosis present

## 2019-01-09 DIAGNOSIS — H3500 Unspecified background retinopathy: Secondary | ICD-10-CM | POA: Diagnosis present

## 2019-01-09 DIAGNOSIS — D508 Other iron deficiency anemias: Secondary | ICD-10-CM | POA: Diagnosis not present

## 2019-01-09 DIAGNOSIS — I6389 Other cerebral infarction: Secondary | ICD-10-CM | POA: Diagnosis not present

## 2019-01-09 DIAGNOSIS — I69354 Hemiplegia and hemiparesis following cerebral infarction affecting left non-dominant side: Secondary | ICD-10-CM | POA: Diagnosis not present

## 2019-01-09 DIAGNOSIS — I63511 Cerebral infarction due to unspecified occlusion or stenosis of right middle cerebral artery: Secondary | ICD-10-CM | POA: Diagnosis not present

## 2019-01-09 HISTORY — DX: Anemia, unspecified: D64.9

## 2019-01-09 HISTORY — DX: Essential (primary) hypertension: I10

## 2019-01-09 HISTORY — DX: Hypertensive retinopathy, unspecified eye: H35.039

## 2019-01-09 LAB — CBC WITH DIFFERENTIAL/PLATELET
Abs Immature Granulocytes: 0.02 10*3/uL (ref 0.00–0.07)
Basophils Absolute: 0.1 10*3/uL (ref 0.0–0.1)
Basophils Relative: 1 %
Eosinophils Absolute: 0.1 10*3/uL (ref 0.0–0.5)
Eosinophils Relative: 1 %
HCT: 31.8 % — ABNORMAL LOW (ref 36.0–46.0)
Hemoglobin: 8.8 g/dL — ABNORMAL LOW (ref 12.0–15.0)
Immature Granulocytes: 0 %
Lymphocytes Relative: 22 %
Lymphs Abs: 1.6 10*3/uL (ref 0.7–4.0)
MCH: 19.5 pg — ABNORMAL LOW (ref 26.0–34.0)
MCHC: 27.7 g/dL — ABNORMAL LOW (ref 30.0–36.0)
MCV: 70.5 fL — ABNORMAL LOW (ref 80.0–100.0)
Monocytes Absolute: 0.6 10*3/uL (ref 0.1–1.0)
Monocytes Relative: 8 %
Neutro Abs: 4.8 10*3/uL (ref 1.7–7.7)
Neutrophils Relative %: 68 %
Platelets: 156 10*3/uL (ref 150–400)
RBC: 4.51 MIL/uL (ref 3.87–5.11)
RDW: 19 % — ABNORMAL HIGH (ref 11.5–15.5)
WBC: 7.1 10*3/uL (ref 4.0–10.5)
nRBC: 0 % (ref 0.0–0.2)

## 2019-01-09 LAB — BASIC METABOLIC PANEL
Anion gap: 9 (ref 5–15)
BUN: 18 mg/dL (ref 8–23)
CO2: 22 mmol/L (ref 22–32)
Calcium: 8.4 mg/dL — ABNORMAL LOW (ref 8.9–10.3)
Chloride: 107 mmol/L (ref 98–111)
Creatinine, Ser: 0.46 mg/dL (ref 0.44–1.00)
GFR calc Af Amer: >60 mL/min (ref >60)
GFR calc non Af Amer: >60 mL/min (ref >60)
Glucose, Bld: 93 mg/dL (ref 70–99)
Potassium: 3.7 mmol/L (ref 3.5–5.1)
Sodium: 138 mmol/L (ref 135–145)

## 2019-01-09 LAB — URINALYSIS, ROUTINE W REFLEX MICROSCOPIC
Bilirubin Urine: NEGATIVE
Glucose, UA: NEGATIVE mg/dL
Hgb urine dipstick: NEGATIVE
Ketones, ur: 5 mg/dL — AB
Leukocytes,Ua: NEGATIVE
Nitrite: NEGATIVE
Protein, ur: NEGATIVE mg/dL
Specific Gravity, Urine: 1.033 — ABNORMAL HIGH (ref 1.005–1.030)
pH: 8 (ref 5.0–8.0)

## 2019-01-09 MED ORDER — ASPIRIN 325 MG PO TABS
325.0000 mg | ORAL_TABLET | Freq: Every day | ORAL | Status: DC
  Administered 2019-01-09 – 2019-01-10 (×2): 325 mg via ORAL
  Filled 2019-01-09 (×2): qty 1

## 2019-01-09 MED ORDER — SODIUM CHLORIDE 0.9 % IV SOLN
INTRAVENOUS | Status: DC
  Administered 2019-01-09 – 2019-01-10 (×2): via INTRAVENOUS

## 2019-01-09 MED ORDER — SENNOSIDES-DOCUSATE SODIUM 8.6-50 MG PO TABS: 1.0000 | ORAL_TABLET | Freq: Every evening | ORAL | Status: DC | PRN

## 2019-01-09 MED ORDER — IOPAMIDOL (ISOVUE-370) INJECTION 76%
100.0000 mL | Freq: Once | INTRAVENOUS | Status: AC | PRN
  Administered 2019-01-09: 100 mL via INTRAVENOUS

## 2019-01-09 MED ORDER — ACETAMINOPHEN 160 MG/5ML PO SOLN: 650.0000 mg | ORAL | Status: DC | PRN

## 2019-01-09 MED ORDER — PANTOPRAZOLE SODIUM 40 MG PO TBEC
40.0000 mg | DELAYED_RELEASE_TABLET | Freq: Every day | ORAL | Status: DC
  Administered 2019-01-09 – 2019-01-14 (×6): 40 mg via ORAL
  Filled 2019-01-09 (×5): qty 1

## 2019-01-09 MED ORDER — ACETAMINOPHEN 325 MG PO TABS
650.0000 mg | ORAL_TABLET | ORAL | Status: DC | PRN
  Administered 2019-01-10 – 2019-01-14 (×6): 650 mg via ORAL
  Filled 2019-01-09 (×6): qty 2

## 2019-01-09 MED ORDER — ASPIRIN 300 MG RE SUPP: 300.0000 mg | Freq: Every day | RECTAL | Status: DC

## 2019-01-09 MED ORDER — ATORVASTATIN CALCIUM 80 MG PO TABS
80.0000 mg | ORAL_TABLET | Freq: Every day | ORAL | Status: DC
  Administered 2019-01-09: 80 mg via ORAL
  Filled 2019-01-09: qty 1

## 2019-01-09 MED ORDER — NADOLOL 40 MG PO TABS
40.0000 mg | ORAL_TABLET | Freq: Every day | ORAL | Status: DC
  Administered 2019-01-11 – 2019-01-14 (×4): 40 mg via ORAL
  Filled 2019-01-09 (×4): qty 1

## 2019-01-09 MED ORDER — ACETAMINOPHEN 650 MG RE SUPP: 650.0000 mg | RECTAL | Status: DC | PRN

## 2019-01-09 MED ORDER — STROKE: EARLY STAGES OF RECOVERY BOOK
Freq: Once | Status: AC
  Administered 2019-01-09: 18:00:00
  Filled 2019-01-09: qty 1

## 2019-01-09 MED ORDER — WHITE PETROLATUM EX OINT
TOPICAL_OINTMENT | CUTANEOUS | Status: AC
  Administered 2019-01-09: 0.2
  Filled 2019-01-09: qty 28.35

## 2019-01-09 MED ORDER — HEPARIN SODIUM (PORCINE) 5000 UNIT/ML IJ SOLN
5000.0000 [IU] | Freq: Three times a day (TID) | INTRAMUSCULAR | Status: DC
  Administered 2019-01-10 – 2019-01-14 (×13): 5000 [IU] via SUBCUTANEOUS
  Filled 2019-01-09 (×10): qty 1

## 2019-01-09 NOTE — Code Documentation (Signed)
85 yo female coming from home with complaints of unsteady gait that started when she woke up this morning. Pt was last known well at 2300 when she went to bed last night. Pt came into the ED at 0759 with these complaints. Initial NIHSS 0 upon RN assessment. Neuro consulted. Upon neurologist assessment, left sided neglect noted and neurology activated a Code Stroke. Stroke RN met patient in hallway and patient taken to CT. CTA/CTP completed and no LVO noted on exam. Pt remains in the IR window until tonight at 2300. Handoff given to [place], Therapist, sports.

## 2019-01-09 NOTE — ED Triage Notes (Addendum)
Pt presents for evaluation of generalized weakness. Reports she got up to go to the bathroom in the night and had some right sided arm "clinching" but that resolved this morning. Pt daughter states that when she visited patient last night around 1130 PM she was complaining about the arm bothering her too. Pt describes "clinching" sensation as something grabbing her arm. Pt denies symptoms or pain at this time.

## 2019-01-09 NOTE — Plan of Care (Signed)
Pt is progressing toward expected goals for discharge

## 2019-01-09 NOTE — ED Provider Notes (Signed)
[facility] EMERGENCY DEPARTMENT Provider Note   CSN: [id number] Arrival date & time: 01/09/19  0759    History   Chief Complaint Chief Complaint  Patient presents with  . Weakness    HPI Madison Odom is a 85 y.o. female.     HPI   85 year old female with unsteadiness.  Patient describes a "gripping" sensation in her left forearm and hand.  She first noticed around 3:00am when she went up to use the bathroom.  She describes difficulty using her Rollator device.  She states that she did not feel dizzy, lightheaded and denied room spinning sensation.  She feels like her strength was fine, but that the Rollator kept going to the side.  She ended up laying back down to sleep.  She noticed the same symptoms when she got back up again this morning and her family brought her to the emergency room.  She denies any acute pain.  No acute visual complaints.  She has 2 daughters at bedside that state that she seems to be at her baseline mental status.  Past Medical History:  Diagnosis Date  . GERD (gastroesophageal reflux disease)   . Hyperlipidemia   . MVP (mitral valve prolapse)   . OP (osteoporosis)   . Pelvis fracture (HCC) 09/2015   CLOSED   . Scoliosis   . Spastic colon     Patient Active Problem List   Diagnosis Date Noted  . Epiretinal membrane, left eye 04/18/2017  . Acute blood loss anemia 09/27/2015  . Pelvic fracture (HCC) 09/21/2015  . Closed fracture of pelvis (HCC)   . Hypertensive retinopathy of both eyes 08/16/2015  . Hypertension 01/28/2014  . Kyphoscoliosis 09/16/2013  . After cataract 02/19/2013  . Pseudophakia of both eyes 01/22/2013  . Posterior capsular opacification 10/29/2012  . Cystoid macular edema 04/30/2012  . Epiretinal membrane 04/30/2012  . MVP (mitral valve prolapse) 05/24/2011  . Hypercholesterolemia 05/24/2011    Past Surgical History:  Procedure Laterality Date  . ABDOMINAL HYSTERECTOMY  1965  . APPENDECTOMY    .  BILATERAL OOPHORECTOMY  1981  . EYE SURGERY    . ROTATOR CUFF REPAIR Right      OB History   No obstetric history on file.      Home Medications    Prior to Admission medications   Medication Sig Start Date End Date Taking? Authorizing Provider  aspirin 81 MG EC tablet Take 81 mg by mouth daily.      [provider]  Calcium Carb-Cholecalciferol (CALCIUM 500 +D PO) Take 1 tablet by mouth daily.     [provider]  FLUZONE HIGH-DOSE 0.5 ML injection TO BE ADMINISTERED BY PHARMACIST FOR IMMUNIZATION 08/22/17   [provider]  Multiple Vitamin (MULTIVITAMIN) tablet Take 1 tablet by mouth daily.    [provider]  nadolol (CORGARD) 40 MG tablet TAKE 1 TABLET BY MOUTH EVERY DAY 03/13/18   Lars Masson, MD  omeprazole (PRILOSEC) 20 MG capsule TAKE 1 CAPSULE BY MOUTH EVERY DAY 05/01/18   Lars Masson, MD  simvastatin (ZOCOR) 20 MG tablet TAKE 1 TABLET BY MOUTH EVERY DAY 05/03/18   Lars Masson, MD    Family History Family History  Problem Relation Age of Onset  . Heart attack Mother   . Hypertension Mother   . Heart disease Father   . Hypertension Father   . Stroke Neg Hx     Social History Social History   Tobacco Use  .  Smoking status: Never Smoker  . Smokeless tobacco: Never Used  Substance Use Topics  . Alcohol use: No  . Drug use: No     Allergies   Phenothiazines; Prochlorperazine; Penicillin v; Penicillins; Tetracycline; and Tetracyclines & related   Review of Systems Review of Systems  All systems reviewed and negative, other than as noted in HPI.  Physical Exam Updated Vital Signs BP 139/71   Pulse 73   Temp 98 F (36.7 C) (Oral)   Resp (!) 25   SpO2 95%   Physical Exam Vitals signs and nursing note reviewed.  Constitutional:      General: She is not in acute distress.    Appearance: She is well-developed.  HENT:     Head: Normocephalic and atraumatic.  Eyes:     General:        Right eye:  No discharge.        Left eye: No discharge.     Conjunctiva/sclera: Conjunctivae normal.  Neck:     Musculoskeletal: Neck supple.  Cardiovascular:     Rate and Rhythm: Normal rate and regular rhythm.     Heart sounds: Normal heart sounds. No murmur. No friction rub. No gallop.   Pulmonary:     Effort: Pulmonary effort is normal. No respiratory distress.     Breath sounds: Normal breath sounds.  Abdominal:     General: There is no distension.     Palpations: Abdomen is soft.     Tenderness: There is no abdominal tenderness.  Musculoskeletal:        General: No tenderness.  Skin:    General: Skin is warm and dry.     Capillary Refill: Capillary refill takes more than 3 seconds.  Neurological:     Mental Status: She is alert.     Comments: R eye droop which pt reports is chronic. CN2-12 otherwise intact. Speech clear. Content appropriate. Strength 5/5 R side. Strength seems normal on L although pt does have difficulty, particularly with L upper extremity. This seems most consistent with neglect though as opposed to deficit with strength or coordination. She does not seem to have a visual field deficit. Her sensation is intact to light touch. She was able to draw a clock without any problems.   Psychiatric:        Behavior: Behavior normal.        Thought Content: Thought content normal.      ED Treatments / Results  Labs (all labs ordered are listed, but only abnormal results are displayed) Labs Reviewed  CBC WITH DIFFERENTIAL/PLATELET - Abnormal; Notable for the following components:      Result Value   Hemoglobin 8.8 (*)    HCT 31.8 (*)    MCV 70.5 (*)    MCH 19.5 (*)    MCHC 27.7 (*)    RDW 19.0 (*)    All other components within normal limits  BASIC METABOLIC PANEL - Abnormal; Notable for the following components:   Calcium 8.4 (*)    All other components within normal limits  URINALYSIS, ROUTINE W REFLEX MICROSCOPIC    EKG EKG  Interpretation  Date/Time:  Thursday January 09 2019 08:12:40 EST Ventricular Rate:  63 PR Interval:    QRS Duration: 237 QT Interval:  444 QTC Calculation: 448 R Axis:   14 Text Interpretation:  Sinus rhythm Confirmed by Raeford Razor [phone]) on 01/09/2019 9:08:27 AM   Radiology Ct Angio Head W Or Wo Contrast  Result Date: 01/09/2019 CLINICAL DATA:  Left-sided neglect. EXAM: CT ANGIOGRAPHY HEAD AND NECK CT PERFUSION BRAIN TECHNIQUE: Multidetector CT imaging of the head and neck was performed using the standard protocol during bolus administration of intravenous contrast. Multiplanar CT image reconstructions and MIPs were obtained to evaluate the vascular anatomy. Carotid stenosis measurements (when applicable) are obtained utilizing NASCET criteria, using the distal internal carotid diameter as the denominator. Multiphase CT imaging of the brain was performed following IV bolus contrast injection. Subsequent parametric perfusion maps were calculated using RAPID software. CONTRAST:  ISOVUE-370 IOPAMIDOL (ISOVUE-370) INJECTION 76% COMPARISON:  None. FINDINGS: CTA NECK FINDINGS Aortic arch: Normal variant 4 vessel aortic arch with the left vertebral artery arising directly from the arch. Widely patent arch vessel origins. Right carotid system: Patent without evidence of stenosis or dissection. Mildly ectatic appearance of the mid to distal cervical ICA. Left carotid system: Patent without evidence of stenosis or dissection. Mildly ectatic appearance of the mid to distal cervical ICA. Vertebral arteries: Patent without evidence of significant stenosis or dissection. Codominant. Skeleton: Mild-to-moderate cervical disc degeneration. Right C4-5 facet ankylosis. Other neck: Subcentimeter bilateral thyroid nodules. Complete right maxillary sinus opacification. Upper chest: Mild pleural-parenchymal scarring in the lung apices. Review of the MIP images confirms the above findings CTA HEAD FINDINGS  Anterior circulation: The internal carotid arteries are widely patent from skull base to carotid termini. ACAs and MCAs are patent without evidence of significant proximal stenosis. There is a distal right M2 occlusion corresponding to the acute right parietal infarct. No aneurysm is identified. Posterior circulation: The intracranial vertebral arteries are widely patent to the basilar. Patent left PICA, right AICA, and bilateral SCAs are visualized. The basilar artery is widely patent. There are predominantly fetal type origins of both PCAs without evidence of significant PCA stenosis. No aneurysm is identified. Venous sinuses: Patent. Anatomic variants: Fetal type origins of both PCAs. Review of the MIP images confirms the above findings CT Brain Perfusion Findings: CBF (<30%) Volume: 30mL Perfusion (Tmax>6.0s) volume: 83mL Mismatch Volume: 33mL by automated RAPID processing, however the area of prolonged Tmax corresponds to the low-density established infarct on the earlier noncontrast head CT without evidence of significant penumbra. Infarction Location: Right parietal lobe IMPRESSION: 1. Distal right M2 occlusion. 2. Associated right parietal infarct without evidence of significant penumbra by CTP. 3. No significant stenosis in the proximal intracranial or cervical arterial circulation. These results were communicated to Dr. Wilford Corner at 11:20 am on 01/09/2019 by text page via the [facility] messaging system. Electronically Signed   By: Sebastian Ache M.D.   On: 01/09/2019 11:32   Ct Head Wo Contrast  Result Date: 01/09/2019 CLINICAL DATA:  Focal neural deficit of greater than 6 hours suspected stroke, LEFT hemi spatial neglect on exam, imbalance, history of hypertension EXAM: CT HEAD WITHOUT CONTRAST TECHNIQUE: Contiguous axial images were obtained from the base of the skull through the vertex without intravenous contrast. Sagittal and coronal MPR images reconstructed from axial data set. COMPARISON:  None FINDINGS:  Brain: Generalized atrophy. Normal ventricular morphology. No midline shift or mass effect. Small vessel chronic ischemic changes of deep cerebral white matter. Loss of gray-white differentiation at the RIGHT parietal lobe consistent with cortical infarct. No intracranial hemorrhage, mass lesion or extra-axial fluid collection. No additional infarcts identified. Vascular: No definite hyperdense vessels Skull: Demineralized with biparietal thinning.  No fractures. Sinuses/Orbits: Complete opacification of RIGHT maxillary sinus and LEFT sphenoid sinus. Remaining visualized paranasal sinuses and mastoid air cells clear Other: N/A IMPRESSION: Atrophy with small vessel chronic ischemic  changes of deep cerebral white matter. Acute/subacute infarct of the RIGHT parietal lobe where loss of gray-white differentiation is seen. RIGHT maxillary and LEFT sphenoid sinus disease changes. Electronically Signed   By: Ulyses Southward M.D.   On: 01/09/2019 09:57   Ct Angio Neck W Or Wo Contrast  Result Date: 01/09/2019 CLINICAL DATA:  Left-sided neglect. EXAM: CT ANGIOGRAPHY HEAD AND NECK CT PERFUSION BRAIN TECHNIQUE: Multidetector CT imaging of the head and neck was performed using the standard protocol during bolus administration of intravenous contrast. Multiplanar CT image reconstructions and MIPs were obtained to evaluate the vascular anatomy. Carotid stenosis measurements (when applicable) are obtained utilizing NASCET criteria, using the distal internal carotid diameter as the denominator. Multiphase CT imaging of the brain was performed following IV bolus contrast injection. Subsequent parametric perfusion maps were calculated using RAPID software. CONTRAST:  ISOVUE-370 IOPAMIDOL (ISOVUE-370) INJECTION 76% COMPARISON:  None. FINDINGS: CTA NECK FINDINGS Aortic arch: Normal variant 4 vessel aortic arch with the left vertebral artery arising directly from the arch. Widely patent arch vessel origins. Right carotid system:  Patent without evidence of stenosis or dissection. Mildly ectatic appearance of the mid to distal cervical ICA. Left carotid system: Patent without evidence of stenosis or dissection. Mildly ectatic appearance of the mid to distal cervical ICA. Vertebral arteries: Patent without evidence of significant stenosis or dissection. Codominant. Skeleton: Mild-to-moderate cervical disc degeneration. Right C4-5 facet ankylosis. Other neck: Subcentimeter bilateral thyroid nodules. Complete right maxillary sinus opacification. Upper chest: Mild pleural-parenchymal scarring in the lung apices. Review of the MIP images confirms the above findings CTA HEAD FINDINGS Anterior circulation: The internal carotid arteries are widely patent from skull base to carotid termini. ACAs and MCAs are patent without evidence of significant proximal stenosis. There is a distal right M2 occlusion corresponding to the acute right parietal infarct. No aneurysm is identified. Posterior circulation: The intracranial vertebral arteries are widely patent to the basilar. Patent left PICA, right AICA, and bilateral SCAs are visualized. The basilar artery is widely patent. There are predominantly fetal type origins of both PCAs without evidence of significant PCA stenosis. No aneurysm is identified. Venous sinuses: Patent. Anatomic variants: Fetal type origins of both PCAs. Review of the MIP images confirms the above findings CT Brain Perfusion Findings: CBF (<30%) Volume: 0mL Perfusion (Tmax>6.0s) volume: 31mL Mismatch Volume: 31mL by automated RAPID processing, however the area of prolonged Tmax corresponds to the low-density established infarct on the earlier noncontrast head CT without evidence of significant penumbra. Infarction Location: Right parietal lobe IMPRESSION: 1. Distal right M2 occlusion. 2. Associated right parietal infarct without evidence of significant penumbra by CTP. 3. No significant stenosis in the proximal intracranial or cervical  arterial circulation. These results were communicated to Dr. Wilford Corner at 11:20 am on 01/09/2019 by text page via the [facility] messaging system. Electronically Signed   By: Sebastian Ache M.D.   On: 01/09/2019 11:32   Ct Cerebral Perfusion W Contrast  Result Date: 01/09/2019 CLINICAL DATA:  Left-sided neglect. EXAM: CT ANGIOGRAPHY HEAD AND NECK CT PERFUSION BRAIN TECHNIQUE: Multidetector CT imaging of the head and neck was performed using the standard protocol during bolus administration of intravenous contrast. Multiplanar CT image reconstructions and MIPs were obtained to evaluate the vascular anatomy. Carotid stenosis measurements (when applicable) are obtained utilizing NASCET criteria, using the distal internal carotid diameter as the denominator. Multiphase CT imaging of the brain was performed following IV bolus contrast injection. Subsequent parametric perfusion maps were calculated using RAPID software. CONTRAST:  100mL ISOVUE-370 IOPAMIDOL (ISOVUE-370) INJECTION 76% COMPARISON:  None. FINDINGS: CTA NECK FINDINGS Aortic arch: Normal variant 4 vessel aortic arch with the left vertebral artery arising directly from the arch. Widely patent arch vessel origins. Right carotid system: Patent without evidence of stenosis or dissection. Mildly ectatic appearance of the mid to distal cervical ICA. Left carotid system: Patent without evidence of stenosis or dissection. Mildly ectatic appearance of the mid to distal cervical ICA. Vertebral arteries: Patent without evidence of significant stenosis or dissection. Codominant. Skeleton: Mild-to-moderate cervical disc degeneration. Right C4-5 facet ankylosis. Other neck: Subcentimeter bilateral thyroid nodules. Complete right maxillary sinus opacification. Upper chest: Mild pleural-parenchymal scarring in the lung apices. Review of the MIP images confirms the above findings CTA HEAD FINDINGS Anterior circulation: The internal carotid arteries are widely patent from skull base to  carotid termini. ACAs and MCAs are patent without evidence of significant proximal stenosis. There is a distal right M2 occlusion corresponding to the acute right parietal infarct. No aneurysm is identified. Posterior circulation: The intracranial vertebral arteries are widely patent to the basilar. Patent left PICA, right AICA, and bilateral SCAs are visualized. The basilar artery is widely patent. There are predominantly fetal type origins of both PCAs without evidence of significant PCA stenosis. No aneurysm is identified. Venous sinuses: Patent. Anatomic variants: Fetal type origins of both PCAs. Review of the MIP images confirms the above findings CT Brain Perfusion Findings: CBF (<30%) Volume: 0mL Perfusion (Tmax>6.0s) volume: 31mL Mismatch Volume: 31mL by automated RAPID processing, however the area of prolonged Tmax corresponds to the low-density established infarct on the earlier noncontrast head CT without evidence of significant penumbra. Infarction Location: Right parietal lobe IMPRESSION: 1. Distal right M2 occlusion. 2. Associated right parietal infarct without evidence of significant penumbra by CTP. 3. No significant stenosis in the proximal intracranial or cervical arterial circulation. These results were communicated to Dr. Wilford CornerArora at 11:20 am on 01/09/2019 by text page [facility]. Electronically Signed   By: Sebastian AcheAllen  Grady M.D.   On: 01/09/2019 11:32    Procedures Procedures (including critical care time)  CRITICAL CARE Performed by: Raeford RazorStephen Hazen Brumett Total critical care time: 35 minutes Critical care time was exclusive of separately billable procedures and treating other patients. Critical care was necessary to treat or prevent imminent or life-threatening deterioration. Critical care was time spent personally by me on the following activities: development of treatment plan with patient and/or surrogate as well as nursing, discussions with consultants, evaluation of  patient's response to treatment, examination of patient, obtaining history from patient or surrogate, ordering and performing treatments and interventions, ordering and review of laboratory studies, ordering and review of radiographic studies, pulse oximetry and re-evaluation of patient's condition.   Medications Ordered in ED Medications - No data to display   Initial Impression / Assessment and Plan / ED Course  I have reviewed the triage vital signs and the nursing notes.  Pertinent labs & imaging results that were available during my care of the patient were reviewed by me and considered in my medical decision making (see chart for details).      85 year old female with what appears to be a left hemispatial neglect.  Unclear as to when exactly she was last known normal but appears to be sometime yesterday. Pt says she noticed around 0300 when she got up to use the bathroom, but one of her daughters at bedside reports that she was complaining of these symptoms to her around 11:30 PM last night.  CT scan w/ R parietal infarct which correlates with exam. Neurology consulted.   Final Clinical Impressions(s) / ED Diagnoses   Final diagnoses:  Cerebrovascular accident (CVA), unspecified mechanism (HCC)  Left-sided neglect    ED Discharge Orders    None       Raeford Razor, MD 01/15/19 [phone]

## 2019-01-09 NOTE — Progress Notes (Signed)
Rehab Admissions Coordinator Note:  Patient was screened by Clois Dupes for appropriateness for an Inpatient Acute Rehab Consult per PT recs.   At this time, we are recommending Inpatient Rehab consult. Please place order for consult.  Clois Dupes RN MSN 01/09/2019, 11:46 PM  I can be reached at [phone].

## 2019-01-09 NOTE — Consult Note (Addendum)
Neurology Consultation  Reason for Consult: stroke Referring Physician: Juleen China  CC: right sided neglect  History is obtained from: patient  HPI: Madison Odom is a 85 y.o. female with past medical history of scoliosis, pelvis fracture, mitral valve prolapse, hyperlipidemia, and GERD.  Patient was last known normal at 2300 hours.  Patient noted that when she woke up and started to use her walker that she felt off balance.  For that reason she was brought to the hospital.  When the MD in the emergency department examined her he noted that she had some neglect.  CT of head was obtained.  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  At that point neurology was called.  Due to findings of left-sided neglect and patient being within the 24-hour window patient was immediately brought back to CT for a perfusion and CTA scan.  LKW: 2300 tpa given?: no, out of window Premorbid modified Rankin scale (mRS): 0 NIH stroke scale of 2 ROS: ROS was performed and is negative except as noted in the HPI.   Past Medical History:  Diagnosis Date  . GERD (gastroesophageal reflux disease)   . Hyperlipidemia   . MVP (mitral valve prolapse)   . OP (osteoporosis)   . Pelvis fracture (HCC) 09/2015   CLOSED   . Scoliosis   . Spastic colon     Family History  Problem Relation Age of Onset  . Heart attack Mother   . Hypertension Mother   . Heart disease Father   . Hypertension Father   . Stroke Neg Hx    Social History:   reports that she has never smoked. She has never used smokeless tobacco. She reports that she does not drink alcohol or use drugs.   Medications No current facility-administered medications for this encounter.   Current Outpatient Medications:  .  aspirin 81 MG EC tablet, Take 81 mg by mouth daily.  , Disp: , Rfl:  .  Calcium Carb-Cholecalciferol (CALCIUM 500 +D PO), Take 1 tablet by mouth daily. , Disp: , Rfl:  .  FLUZONE HIGH-DOSE 0.5 ML injection, TO BE  ADMINISTERED BY PHARMACIST FOR IMMUNIZATION, Disp: , Rfl: 0 .  Multiple Vitamin (MULTIVITAMIN) tablet, Take 1 tablet by mouth daily., Disp: , Rfl:  .  nadolol (CORGARD) 40 MG tablet, TAKE 1 TABLET BY MOUTH EVERY DAY, Disp: 90 tablet, Rfl: 3 .  omeprazole (PRILOSEC) 20 MG capsule, TAKE 1 CAPSULE BY MOUTH EVERY DAY, Disp: 90 capsule, Rfl: 2 .  simvastatin (ZOCOR) 20 MG tablet, TAKE 1 TABLET BY MOUTH EVERY DAY, Disp: 90 tablet, Rfl: 2   Exam: Current vital signs: BP 139/71   Pulse 73   Temp 98 F (36.7 C) (Oral)   Resp (!) 25   SpO2 95%  Vital signs in last 24 hours: Temp:  [98 F (36.7 C)] 98 F (36.7 C) (02/27 0813) Pulse Rate:  [57-73] 73 (02/27 0930) Resp:  [15-25] 25 (02/27 0930) BP: (137-147)/(67-71) 139/71 (02/27 0930) SpO2:  [95 %-96 %] 95 % (02/27 0930)  Physical Exam  Constitutional: Appears well-developed and well-nourished.  Psych: Affect appropriate to situation Eyes: No scleral injection HENT: No OP obstrucion Head: Normocephalic.  Cardiovascular: Normal rate and regular rhythm.  Respiratory: Effort normal, non-labored breathing GI: Soft.  No distension. There is no tenderness.  Skin: WDI  Neuro: Mental Status: Patient is awake, alert, oriented to person, place, month, year, and situation. Patient is able to give a clear and coherent history. Complete  neglect of left side Cranial Nerves: II: Visual Fields are full. III,IV, VI: EOMI without ptosis or diploplia.  Pupils are equal, round, and reactive to light.   V: Facial sensation is symmetric to temperature however to DSS neglected left side VII: Facial movement is symmetric.  VIII: hearing is intact to voice X: Uvula elevates symmetrically XI: Shoulder shrug is symmetric. XII: tongue is midline without atrophy or fasciculations.  Motor: Tone is normal. Bulk is normal. 5/5 strength was present in all four extremities.  Sensory: Sensation is symmetric to light touch and temperature in the arms and legs.  DSS neglect left side Deep Tendon Reflexes: 1+ and symmetric in the biceps and patellae.  Plantars: Toes are downgoing bilaterally.  Cerebellar: FNF and HKS are intact bilaterally  Labs I have reviewed labs in epic and the results pertinent to this consultation are:   CBC    Component Value Date/Time   WBC 7.1 01/09/2019 0844   RBC 4.51 01/09/2019 0844   HGB 8.8 (L) 01/09/2019 0844   HCT 31.8 (L) 01/09/2019 0844   PLT 156 01/09/2019 0844   MCV 70.5 (L) 01/09/2019 0844   MCH 19.5 (L) 01/09/2019 0844   MCHC 27.7 (L) 01/09/2019 0844   RDW 19.0 (H) 01/09/2019 0844   LYMPHSABS 1.6 01/09/2019 0844   MONOABS 0.6 01/09/2019 0844   EOSABS 0.1 01/09/2019 0844   BASOSABS 0.1 01/09/2019 0844    CMP     Component Value Date/Time   NA 138 01/09/2019 0844   K 3.7 01/09/2019 0844   CL 107 01/09/2019 0844   CO2 22 01/09/2019 0844   GLUCOSE 93 01/09/2019 0844   BUN 18 01/09/2019 0844   CREATININE 0.46 01/09/2019 0844   CREATININE 0.58 (L) 12/01/2015 0838   CALCIUM 8.4 (L) 01/09/2019 0844   PROT 6.3 11/29/2017 0925   ALBUMIN 4.0 11/29/2017 0925   AST 21 11/29/2017 0925   ALT 14 11/29/2017 0925   ALKPHOS 70 11/29/2017 0925   BILITOT 0.5 11/29/2017 0925   GFRNONAA >60 01/09/2019 0844   GFRAA >60 01/09/2019 0844    Lipid Panel     Component Value Date/Time   CHOL 178 11/29/2017 0925   TRIG 90.0 11/29/2017 0925   HDL 69.80 11/29/2017 0925   CHOLHDL 3 11/29/2017 0925   VLDL 18.0 11/29/2017 0925   LDLCALC 90 11/29/2017 0925     Imaging I have reviewed the images obtained: CT-scan of the brain-acute/subacute infarct of the right parietal lobe or loss of the gray-white differential is seen. CTA of head neck- distal right M2 occlusion.  CT perfusion scan- no area of core, 31 cc penumbra.  Felicie Morn PA-C [facility] [phone]  M-F  (9:00 am- 5:00 PM)  01/09/2019, 10:33 AM    Attending addendum Patient seen and examined.  I was called in by Dr. Juleen China  to examine the patient and evaluate her for possible stroke due to concern for left-sided neglect.  I called a code stroke, because she was within the 24-hour window for possible intervention.  I immediately proceeded to see the patient. According to the family, the patient is independent at baseline, lives by herself, uses a walker to walk not because of a prior stroke but because of having had a hip fracture and weakness since then, was at baseline and usual state of health last known normal 11:30 PM last night, woke up this morning with some left-sided weakness for which they brought her in for evaluation. She had no weakness on  initial ED provider exam, hence a code stroke was not called out.  The ED attending examined the patient and was concerned that the patient was neglecting her left side and call me immediately after the CT scan was done and showed a left parietal area of hypodensity.  Initiated a code stroke and saw the patient.  She had both visual and sensory neglect with an NIH stroke scale of 2. Detailed examination that I performed independently is identical to the one documented above. CTA of the head and neck showed a right M2-distal part occlusion with CT perfusion showing 31 cc penumbra with no established infarct.  Aspects 8 on the Noncon CT of the head. Case discussed in detail with the IR interventionalists- Dr. Corliss Skains, and due to the dislocation of the clot and low NIH stroke scale, deemed to be not a candidate for EVT. Outside the window for IV TPA because of last known normal being greater than 4.5 hours.   Assessment:  Patient with acute/subacute infarct of the right parietal lobe and exam showing complete neglect of the left side.  Outside the window for TPA due to last known normal greater than 4.5 hours. Not a candidate for EVT due to distal M2 lesion and low NIH stroke scale. 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   You can look them up on [email or web address]  Password [email or web address]   -- Milon Dikes, MD [facility] Pager: [phone] If 7pm to 7am, please call on call as listed on AMION.  CRITICAL CARE ATTESTATION Performed by: Milon Dikes, MD Total critical care time: 60 minutes Critical care time was exclusive of separately billable procedures and treating other patients and/or supervising APPs/Residents/Students Critical care was necessary to treat or prevent imminent or life-threatening deterioration due to acute ischemic stroke  This patient is critically ill and at significant risk for neurological worsening and/or death and care requires constant monitoring. Critical care was time spent personally by me on the following activities: development of treatment plan with patient and/or surrogate as well as nursing, discussions with consultants, evaluation of patient's response to treatment, examination of patient, obtaining history from patient or surrogate, ordering and performing treatments and interventions, ordering and review of laboratory studies, ordering and review of radiographic studies, pulse oximetry, re-evaluation of patient's condition, participation in multidisciplinary rounds and medical decision making of high complexity in the care of this patient.

## 2019-01-09 NOTE — Plan of Care (Signed)
Pt is progressing towards goals of care

## 2019-01-09 NOTE — Evaluation (Addendum)
Physical Therapy Evaluation Patient Details Name: Madison Odom MRN: [id number] DOB: October 21, 1934 Today's Date: 01/09/2019   History of Present Illness  Patient is a 85 y/o female presenting to the ED on 01/09/19 with primary complaints of imbalance. CT of head was obtained.  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 PMH significant for scoliosis, pelvis fracture, mitral valve prolapse, hyperlipidemia, and GERD.  Clinical Impression  Madison Odom admitted with the above listed diagnosis. Patient reports Mod I with mobility prior to admission with rollator. Patient today requiring Min A throughout all bed level mobility, transfers and gait with noted poor attention to L side with patient often running into objects on L side. Requires consistent cueing throughout session for safety. Spoke to family members present with PT and family both agreeing patient is currently not safe to return home currently. Will recommend CIR at discharge to progress safe functional mobility.     Follow Up Recommendations CIR    Equipment Recommendations  Other (comment)(defer)    Recommendations for Other Services OT consult     Precautions / Restrictions Precautions Precautions: Fall Restrictions Weight Bearing Restrictions: No      Mobility  Bed Mobility Overal bed mobility: Needs Assistance Bed Mobility: Supine to Sit;Sit to Supine     Supine to sit: Min assist Sit to supine: Min assist   General bed mobility comments: Min A for sequencing and safety;  Transfers Overall transfer level: Needs assistance Equipment used: Rolling walker (2 wheeled) Transfers: Sit to/from [employer] Sit to Stand: Min assist Stand pivot transfers: Min assist       General transfer comment: Min A for immediate standing balance and safety; poor attention to L side  Ambulation/Gait Ambulation/Gait assistance: Min assist Gait Distance (Feet): 15 Feet(2 reps) Assistive device:  Rolling walker (2 wheeled) Gait Pattern/deviations: Step-to pattern;Step-through pattern;Decreased stride length;Trunk flexed Gait velocity: decreased   General Gait Details: trunk flexion likely due to spinal features; poor attention to L side with patient running into objects; 1 LOB requiring Mod A due to L neglect  Stairs            Wheelchair Mobility    Modified Rankin (Stroke Patients Only)       Balance Overall balance assessment: Needs assistance Sitting-balance support: Bilateral upper extremity supported;Feet supported Sitting balance-Leahy Scale: Fair   Postural control: (forward flexion) Standing balance support: Bilateral upper extremity supported;During functional activity Standing balance-Leahy Scale: Poor                               Pertinent Vitals/Pain Pain Assessment: No/denies pain    Home Living Family/patient expects to be discharged to:: Private residence Living Arrangements: Alone Available Help at Discharge: Family;Available 24 hours/day Type of Home: (townhouse) Home Access: Stairs to enter   [employer] of Steps: 1 Home Layout: Two level(has a chair lift) Home Equipment: Walker - 4 wheels;Shower seat;Grab bars - tub/shower      Prior Function Level of Independence: Independent with assistive device(s)         Comments: use of rollator for mobility; has been in OPPT as recent as yesterday working on Educational psychologist Dominance        Extremity/Trunk Assessment   Upper Extremity Assessment Upper Extremity Assessment: Defer to OT evaluation    Lower Extremity Assessment Lower Extremity Assessment: Generalized weakness;LLE deficits/detail LLE Deficits / Details: seemingly L sided neglect  Cervical / Trunk Assessment Cervical / Trunk Assessment: Kyphotic(scoliosis)  Communication   Communication: No difficulties  Cognition Arousal/Alertness: Awake/alert Behavior During Therapy: WFL for tasks  assessed/performed Overall Cognitive Status: Within Functional Limits for tasks assessed                                        General Comments General comments (skin integrity, edema, etc.): family present and supportive    Exercises     Assessment/Plan    PT Assessment Patient needs continued PT services  PT Problem List Decreased strength;Decreased activity tolerance;Decreased balance;Decreased mobility;Decreased knowledge of use of DME;Decreased safety awareness       PT Treatment Interventions DME instruction;Gait training;Stair training;Therapeutic activities;Therapeutic exercise;Balance training;Patient/family education;Neuromuscular re-education;Functional mobility training    PT Goals (Current goals can be found in the Care Plan section)  Acute Rehab PT Goals Patient Stated Goal: "return home soon" PT Goal Formulation: With patient Time For Goal Achievement: 01/23/19 Potential to Achieve Goals: Good    Frequency Min 4X/week   Barriers to discharge        Co-evaluation               AM-PAC PT "6 Clicks" Mobility  Outcome Measure Help needed turning from your back to your side while in a flat bed without using bedrails?: A Little Help needed moving from lying on your back to sitting on the side of a flat bed without using bedrails?: A Little Help needed moving to and from a bed to a chair (including a wheelchair)?: A Little Help needed standing up from a chair using your arms (e.g., wheelchair or bedside chair)?: A Little Help needed to walk in hospital room?: A Little Help needed climbing 3-5 steps with a railing? : A Lot 6 Click Score: 17    End of Session Equipment Utilized During Treatment: Gait belt Activity Tolerance: Patient tolerated treatment well Patient left: in bed;with call bell/phone within reach;with family/visitor present;with nursing/sitter in room Nurse Communication: Mobility status PT Visit Diagnosis: Unsteadiness on  feet (R26.81);Other abnormalities of gait and mobility (R26.89);Muscle weakness (generalized) (M62.81)    Time: 5643-3295 PT Time Calculation (min) (ACUTE ONLY): 48 min   Charges:   PT Evaluation $PT Eval Moderate Complexity: 1 Mod PT Treatments $Gait Training: 8-22 mins $Therapeutic Activity: 8-22 mins        Kipp Laurence, PT, DPT [facility] Physical Therapist 01/09/19 4:53 PM Pager: [phone] Office: [phone]

## 2019-01-09 NOTE — H&P (Signed)
[facility] History and Physical   Patient: Madison Odom ZOX:[id number]   PCP: [name], NP DOB: 05/14/1934   DOA: 01/09/2019   DOS: 01/09/2019   DOS: the patient was seen and examined on 01/09/2019  Patient coming from: The patient is coming from home.  Chief Complaint: Difficulty walking  HPI: Madison Odom is a 85 y.o. female with Past medical history of GERD, HLD, scoliosis. Patient presented with complaints of difficulty walking. She reports that she actually has some difficulty walking at her baseline but this morning was difficult more than her baseline. Denies any nausea or vomiting. She reports that at night when she went to sleep she was fine. No fall no trauma no injury reported. At the time of my evaluation no chest pain abdominal pain reported. No dizziness lightheadedness.  No difficulty with vision.  No diarrhea no constipation. Patient also reports no tingling or numbness.  ED Course: EDP examined the patient and felt that the patient was likely having right-sided neglect.  CT head was ordered which was positive for acute stroke.  Neurology was consulted.  Patient was referred for admission to stepdown unit.  At her baseline ambulates with walker And is independent for most of her ADL; manages her medication on her own.  Review of Systems: as mentioned in the history of present illness.  All other systems reviewed and are negative.  Past Medical History:  Diagnosis Date  . GERD (gastroesophageal reflux disease)   . Hyperlipidemia   . MVP (mitral valve prolapse)   . OP (osteoporosis)   . Pelvis fracture (HCC) 09/2015   CLOSED   . Scoliosis   . Spastic colon    Past Surgical History:  Procedure Laterality Date  . ABDOMINAL HYSTERECTOMY  1965  . APPENDECTOMY    . BILATERAL OOPHORECTOMY  1981  . EYE SURGERY    . ROTATOR CUFF REPAIR Right    Social History:  reports that she has never smoked. She has never used smokeless tobacco. She reports that  she does not drink alcohol or use drugs.  Allergies  Allergen Reactions  . Phenothiazines Other (See Comments) and Anaphylaxis    Unknown reaction  . Prochlorperazine Anaphylaxis  . Penicillin V     Other reaction(s): Other (See Comments) Yeast   . Penicillins     Yeast infections  . Tetracycline     Other reaction(s): Other (See Comments) Yeast infection  . Tetracyclines & Related Other (See Comments)    Unknown reaction   Family History  Problem Relation Age of Onset  . Heart attack Mother   . Hypertension Mother   . Heart disease Father   . Hypertension Father   . Stroke Neg Hx      Prior to Admission medications   Medication Sig Start Date End Date Taking? Authorizing Provider  aspirin 81 MG EC tablet Take 81 mg by mouth daily.      [provider]  Calcium Carb-Cholecalciferol (CALCIUM 500 +D PO) Take 1 tablet by mouth daily.     [provider]  FLUZONE HIGH-DOSE 0.5 ML injection TO BE ADMINISTERED BY PHARMACIST FOR IMMUNIZATION 08/22/17   [provider]  Multiple Vitamin (MULTIVITAMIN) tablet Take 1 tablet by mouth daily.    [provider]  nadolol (CORGARD) 40 MG tablet TAKE 1 TABLET BY MOUTH EVERY DAY 03/13/18   Lars Masson, MD  omeprazole (PRILOSEC) 20 MG capsule TAKE 1 CAPSULE BY MOUTH EVERY DAY 05/01/18   Tobias Alexander  H, MD  simvastatin (ZOCOR) 20 MG tablet TAKE 1 TABLET BY MOUTH EVERY DAY 05/03/18   Lars Masson, MD    Physical Exam: Vitals:   01/09/19 1215 01/09/19 1230 01/09/19 1330 01/09/19 1530  BP: 126/65 135/63 110/60 (!) 147/72  Pulse:   63 91  Resp: 17 18    Temp:   98.6 F (37 C) 98.6 F (37 C)  TempSrc:   Oral Oral  SpO2:   92%     General: Alert, Awake and Oriented to Time, Place and Person. Appear in mild distress, affect appropriate Eyes: PERRL, Conjunctiva normal, right eye ptosis ENT: Oral Mucosa clear moist. Neck: no JVD, no Abnormal Mass Or lumps Cardiovascular: S1 and S2 Present,  aortic systolic Murmur, Peripheral Pulses Present Respiratory: normal respiratory effort, Bilateral Air entry equal and Decreased, no use of accessory muscle, Clear to Auscultation, no Crackles, no wheezes Abdomen: Bowel Sound present, Soft and no tenderness, no hernia Skin: no redness, no Rash, no induration Extremities: no Pedal edema, no calf tenderness Neurologic: Mental status AAOx3, speech normal, attention normal, Cranial Nerves PERRL, EOM normal and present,  Motor strength bilateral equal strength 5/5,  Sensation present to light touch, left neglect. Reflexes present knee and biceps, babinski negative,  Cerebellar test finger-nose-finger difficult on left side. Gait not checked due to patient safety concerns.  Labs on Admission:  CBC: Recent Labs  Lab 01/09/19 0844  WBC 7.1  NEUTROABS 4.8  HGB 8.8*  HCT 31.8*  MCV 70.5*  PLT 156   Basic Metabolic Panel: Recent Labs  Lab 01/09/19 0844  NA 138  K 3.7  CL 107  CO2 22  GLUCOSE 93  BUN 18  CREATININE 0.46  CALCIUM 8.4*   GFR: CrCl cannot be calculated (Unknown ideal weight.). Liver Function Tests: No results for input(s): AST, ALT, ALKPHOS, BILITOT, PROT, ALBUMIN in the last 168 hours. No results for input(s): LIPASE, AMYLASE in the last 168 hours. No results for input(s): AMMONIA in the last 168 hours. Coagulation Profile: No results for input(s): INR, PROTIME in the last 168 hours. Cardiac Enzymes: No results for input(s): CKTOTAL, CKMB, CKMBINDEX, TROPONINI in the last 168 hours. BNP (last 3 results) No results for input(s): PROBNP in the last 8760 hours. HbA1C: No results for input(s): HGBA1C in the last 72 hours. CBG: No results for input(s): GLUCAP in the last 168 hours. Lipid Profile: No results for input(s): CHOL, HDL, LDLCALC, TRIG, CHOLHDL, LDLDIRECT in the last 72 hours. Thyroid Function Tests: No results for input(s): TSH, T4TOTAL, FREET4, T3FREE, THYROIDAB in the last 72 hours. Anemia  Panel: No results for input(s): VITAMINB12, FOLATE, FERRITIN, TIBC, IRON, RETICCTPCT in the last 72 hours. Urine analysis:    Component Value Date/Time   COLORURINE YELLOW 01/09/2019 0839   APPEARANCEUR HAZY (A) 01/09/2019 0839   LABSPEC 1.033 (H) 01/09/2019 0839   PHURINE 8.0 01/09/2019 0839   GLUCOSEU NEGATIVE 01/09/2019 0839   HGBUR NEGATIVE 01/09/2019 0839   BILIRUBINUR NEGATIVE 01/09/2019 0839   BILIRUBINUR n 11/16/2016 0928   KETONESUR 5 (A) 01/09/2019 0839   PROTEINUR NEGATIVE 01/09/2019 0839   UROBILINOGEN 0.2 11/16/2016 0928   UROBILINOGEN 0.2 09/22/2015 1153   NITRITE NEGATIVE 01/09/2019 0839   LEUKOCYTESUR NEGATIVE 01/09/2019 0839    Radiological Exams on Admission: Ct Angio Head W Or Wo Contrast  Result Date: 01/09/2019 CLINICAL DATA:  Left-sided neglect. EXAM: CT ANGIOGRAPHY HEAD AND NECK CT PERFUSION BRAIN TECHNIQUE: Multidetector CT imaging of the head and neck was performed using  the standard protocol during bolus administration of intravenous contrast. Multiplanar CT image reconstructions and MIPs were obtained to evaluate the vascular anatomy. Carotid stenosis measurements (when applicable) are obtained utilizing NASCET criteria, using the distal internal carotid diameter as the denominator. Multiphase CT imaging of the brain was performed following IV bolus contrast injection. Subsequent parametric perfusion maps were calculated using RAPID software. CONTRAST:  ISOVUE-370 IOPAMIDOL (ISOVUE-370) INJECTION 76% COMPARISON:  None. FINDINGS: CTA NECK FINDINGS Aortic arch: Normal variant 4 vessel aortic arch with the left vertebral artery arising directly from the arch. Widely patent arch vessel origins. Right carotid system: Patent without evidence of stenosis or dissection. Mildly ectatic appearance of the mid to distal cervical ICA. Left carotid system: Patent without evidence of stenosis or dissection. Mildly ectatic appearance of the mid to distal cervical ICA.  Vertebral arteries: Patent without evidence of significant stenosis or dissection. Codominant. Skeleton: Mild-to-moderate cervical disc degeneration. Right C4-5 facet ankylosis. Other neck: Subcentimeter bilateral thyroid nodules. Complete right maxillary sinus opacification. Upper chest: Mild pleural-parenchymal scarring in the lung apices. Review of the MIP images confirms the above findings CTA HEAD FINDINGS Anterior circulation: The internal carotid arteries are widely patent from skull base to carotid termini. ACAs and MCAs are patent without evidence of significant proximal stenosis. There is a distal right M2 occlusion corresponding to the acute right parietal infarct. No aneurysm is identified. Posterior circulation: The intracranial vertebral arteries are widely patent to the basilar. Patent left PICA, right AICA, and bilateral SCAs are visualized. The basilar artery is widely patent. There are predominantly fetal type origins of both PCAs without evidence of significant PCA stenosis. No aneurysm is identified. Venous sinuses: Patent. Anatomic variants: Fetal type origins of both PCAs. Review of the MIP images confirms the above findings CT Brain Perfusion Findings: CBF (<30%) Volume: 72mL Perfusion (Tmax>6.0s) volume: 66mL Mismatch Volume: 25mL by automated RAPID processing, however the area of prolonged Tmax corresponds to the low-density established infarct on the earlier noncontrast head CT without evidence of significant penumbra. Infarction Location: Right parietal lobe IMPRESSION: 1. Distal right M2 occlusion. 2. Associated right parietal infarct without evidence of significant penumbra by CTP. 3. No significant stenosis in the proximal intracranial or cervical arterial circulation. These results were communicated to Dr. Wilford Corner at 11:20 am on 01/09/2019 by text page via the [facility] messaging system. Electronically Signed   By: Sebastian Ache M.D.   On: 01/09/2019 11:32   Ct Head Wo Contrast  Result  Date: 01/09/2019 CLINICAL DATA:  Focal neural deficit of greater than 6 hours suspected stroke, LEFT hemi spatial neglect on exam, imbalance, history of hypertension EXAM: CT HEAD WITHOUT CONTRAST TECHNIQUE: Contiguous axial images were obtained from the base of the skull through the vertex without intravenous contrast. Sagittal and coronal MPR images reconstructed from axial data set. COMPARISON:  None FINDINGS: Brain: Generalized atrophy. Normal ventricular morphology. No midline shift or mass effect. Small vessel chronic ischemic changes of deep cerebral white matter. Loss of gray-white differentiation at the RIGHT parietal lobe consistent with cortical infarct. No intracranial hemorrhage, mass lesion or extra-axial fluid collection. No additional infarcts identified. Vascular: No definite hyperdense vessels Skull: Demineralized with biparietal thinning.  No fractures. Sinuses/Orbits: Complete opacification of RIGHT maxillary sinus and LEFT sphenoid sinus. Remaining visualized paranasal sinuses and mastoid air cells clear Other: N/A IMPRESSION: Atrophy with small vessel chronic ischemic changes of deep cerebral white matter. Acute/subacute infarct of the RIGHT parietal lobe where loss of gray-white differentiation is seen. RIGHT maxillary and  LEFT sphenoid sinus disease changes. Electronically Signed   By: Ulyses SouthwardMark  Boles M.D.   On: 01/09/2019 09:57   Ct Angio Neck W Or Wo Contrast  Result Date: 01/09/2019 CLINICAL DATA:  Left-sided neglect. EXAM: CT ANGIOGRAPHY HEAD AND NECK CT PERFUSION BRAIN TECHNIQUE: Multidetector CT imaging of the head and neck was performed using the standard protocol during bolus administration of intravenous contrast. Multiplanar CT image reconstructions and MIPs were obtained to evaluate the vascular anatomy. Carotid stenosis measurements (when applicable) are obtained utilizing NASCET criteria, using the distal internal carotid diameter as the denominator. Multiphase CT imaging of the  brain was performed following IV bolus contrast injection. Subsequent parametric perfusion maps were calculated using RAPID software. CONTRAST:  100mL ISOVUE-370 IOPAMIDOL (ISOVUE-370) INJECTION 76% COMPARISON:  None. FINDINGS: CTA NECK FINDINGS Aortic arch: Normal variant 4 vessel aortic arch with the left vertebral artery arising directly from the arch. Widely patent arch vessel origins. Right carotid system: Patent without evidence of stenosis or dissection. Mildly ectatic appearance of the mid to distal cervical ICA. Left carotid system: Patent without evidence of stenosis or dissection. Mildly ectatic appearance of the mid to distal cervical ICA. Vertebral arteries: Patent without evidence of significant stenosis or dissection. Codominant. Skeleton: Mild-to-moderate cervical disc degeneration. Right C4-5 facet ankylosis. Other neck: Subcentimeter bilateral thyroid nodules. Complete right maxillary sinus opacification. Upper chest: Mild pleural-parenchymal scarring in the lung apices. Review of the MIP images confirms the above findings CTA HEAD FINDINGS Anterior circulation: The internal carotid arteries are widely patent from skull base to carotid termini. ACAs and MCAs are patent without evidence of significant proximal stenosis. There is a distal right M2 occlusion corresponding to the acute right parietal infarct. No aneurysm is identified. Posterior circulation: The intracranial vertebral arteries are widely patent to the basilar. Patent left PICA, right AICA, and bilateral SCAs are visualized. The basilar artery is widely patent. There are predominantly fetal type origins of both PCAs without evidence of significant PCA stenosis. No aneurysm is identified. Venous sinuses: Patent. Anatomic variants: Fetal type origins of both PCAs. Review of the MIP images confirms the above findings CT Brain Perfusion Findings: CBF (<30%) Volume: 0mL Perfusion (Tmax>6.0s) volume: 31mL Mismatch Volume: 31mL by automated  RAPID processing, however the area of prolonged Tmax corresponds to the low-density established infarct on the earlier noncontrast head CT without evidence of significant penumbra. Infarction Location: Right parietal lobe IMPRESSION: 1. Distal right M2 occlusion. 2. Associated right parietal infarct without evidence of significant penumbra by CTP. 3. No significant stenosis in the proximal intracranial or cervical arterial circulation. These results were communicated to Dr. Wilford CornerArora at 11:20 am on 01/09/2019 by text page via the [facility] messaging system. Electronically Signed   By: Sebastian AcheAllen  Grady M.D.   On: 01/09/2019 11:32   Ct Cerebral Perfusion W Contrast  Result Date: 01/09/2019 CLINICAL DATA:  Left-sided neglect. EXAM: CT ANGIOGRAPHY HEAD AND NECK CT PERFUSION BRAIN TECHNIQUE: Multidetector CT imaging of the head and neck was performed using the standard protocol during bolus administration of intravenous contrast. Multiplanar CT image reconstructions and MIPs were obtained to evaluate the vascular anatomy. Carotid stenosis measurements (when applicable) are obtained utilizing NASCET criteria, using the distal internal carotid diameter as the denominator. Multiphase CT imaging of the brain was performed following IV bolus contrast injection. Subsequent parametric perfusion maps were calculated using RAPID software. CONTRAST:  100mL ISOVUE-370 IOPAMIDOL (ISOVUE-370) INJECTION 76% COMPARISON:  None. FINDINGS: CTA NECK FINDINGS Aortic arch: Normal variant 4 vessel aortic arch with the  left vertebral artery arising directly from the arch. Widely patent arch vessel origins. Right carotid system: Patent without evidence of stenosis or dissection. Mildly ectatic appearance of the mid to distal cervical ICA. Left carotid system: Patent without evidence of stenosis or dissection. Mildly ectatic appearance of the mid to distal cervical ICA. Vertebral arteries: Patent without evidence of significant stenosis or dissection.  Codominant. Skeleton: Mild-to-moderate cervical disc degeneration. Right C4-5 facet ankylosis. Other neck: Subcentimeter bilateral thyroid nodules. Complete right maxillary sinus opacification. Upper chest: Mild pleural-parenchymal scarring in the lung apices. Review of the MIP images confirms the above findings CTA HEAD FINDINGS Anterior circulation: The internal carotid arteries are widely patent from skull base to carotid termini. ACAs and MCAs are patent without evidence of significant proximal stenosis. There is a distal right M2 occlusion corresponding to the acute right parietal infarct. No aneurysm is identified. Posterior circulation: The intracranial vertebral arteries are widely patent to the basilar. Patent left PICA, right AICA, and bilateral SCAs are visualized. The basilar artery is widely patent. There are predominantly fetal type origins of both PCAs without evidence of significant PCA stenosis. No aneurysm is identified. Venous sinuses: Patent. Anatomic variants: Fetal type origins of both PCAs. Review of the MIP images confirms the above findings CT Brain Perfusion Findings: CBF (<30%) Volume: 0mL Perfusion (Tmax>6.0s) volume: 31mL Mismatch Volume: 31mL by automated RAPID processing, however the area of prolonged Tmax corresponds to the low-density established infarct on the earlier noncontrast head CT without evidence of significant penumbra. Infarction Location: Right parietal lobe IMPRESSION: 1. Distal right M2 occlusion. 2. Associated right parietal infarct without evidence of significant penumbra by CTP. 3. No significant stenosis in the proximal intracranial or cervical arterial circulation. These results were communicated to Dr. Wilford Corner at 11:20 am on 01/09/2019 by text page via the [facility] messaging system. Electronically Signed   By: Sebastian Ache M.D.   On: 01/09/2019 11:32   EKG: Independently reviewed. normal sinus rhythm, nonspecific ST and T waves changes.  Assessment/Plan 1.  Acute  right parietal CVA. Side of the window for TPA. Within the window for endovascular intervention although per neurology not a candidate right now. Recommend to admit to stepdown unit for close monitoring and frequent neuro checks and if there is a new worsening patient will be a candidate for endovascular intervention. Currently PT OT consulted. N.p.o. until speech therapy consulted. Aspirin. Lipitor. Echocardiogram ordered as well. Continue to monitor on telemetry.  2.  Accelerated hypertension. Patient is on nadolol at home. Currently allowing permissive hypertension. Nadolol is resumed but start date is 01/11/2019.  3.  GERD. Continue PPI.  Nutrition: N.p.o. pending speech evaluation DVT Prophylaxis: subcutaneous Heparin  Advance goals of care discussion: full code   Consults: neurology   Family Communication: no family was present at bedside, at the time of interview.  Disposition: Admitted as inpatient, step-down unit. Likely to be discharged home, in 3 days.  Author: Lynden Oxford, MD [facility] 01/09/2019  To reach On-call, see care teams to locate the attending and reach out to them via [email or web address]. If 7PM-7AM, please contact night-coverage If you still have difficulty reaching the attending provider, please page the [facility] (Director on Call) for [facility] on amion for assistance.

## 2019-01-10 ENCOUNTER — Other Ambulatory Visit (HOSPITAL_COMMUNITY): Payer: Medicare Other

## 2019-01-10 ENCOUNTER — Inpatient Hospital Stay (HOSPITAL_COMMUNITY): Payer: Medicare Other

## 2019-01-10 LAB — COMPREHENSIVE METABOLIC PANEL
ALBUMIN: 3.1 g/dL — AB (ref 3.5–5.0)
ALT: 15 U/L (ref 0–44)
AST: 25 U/L (ref 15–41)
Alkaline Phosphatase: 51 U/L (ref 38–126)
Anion gap: 10 (ref 5–15)
BUN: 14 mg/dL (ref 8–23)
CO2: 22 mmol/L (ref 22–32)
Calcium: 8.5 mg/dL — ABNORMAL LOW (ref 8.9–10.3)
Chloride: 107 mmol/L (ref 98–111)
Creatinine, Ser: 0.47 mg/dL (ref 0.44–1.00)
GFR calc Af Amer: >60 mL/min (ref >60)
GFR calc non Af Amer: >60 mL/min (ref >60)
GLUCOSE: 83 mg/dL (ref 70–99)
Potassium: 3.6 mmol/L (ref 3.5–5.1)
Sodium: 139 mmol/L (ref 135–145)
Total Bilirubin: 0.5 mg/dL (ref 0.3–1.2)
Total Protein: 5.8 g/dL — ABNORMAL LOW (ref 6.5–8.1)

## 2019-01-10 LAB — IRON AND TIBC
Iron: 12 ug/dL — ABNORMAL LOW (ref 28–170)
Saturation Ratios: 3 % — ABNORMAL LOW (ref 10.4–31.8)
TIBC: 441 ug/dL (ref 250–450)
UIBC: 429 ug/dL

## 2019-01-10 LAB — LIPID PANEL
CHOLESTEROL: 158 mg/dL (ref 0–200)
HDL: 61 mg/dL (ref >40)
LDL Cholesterol: 80 mg/dL (ref 0–99)
Total CHOL/HDL Ratio: 2.6 RATIO
Triglycerides: 86 mg/dL (ref <150)
VLDL: 17 mg/dL (ref 0–40)

## 2019-01-10 LAB — FERRITIN: Ferritin: 7 ng/mL — ABNORMAL LOW (ref 11–307)

## 2019-01-10 LAB — GLUCOSE, CAPILLARY: Glucose-Capillary: 80 mg/dL (ref 70–99)

## 2019-01-10 LAB — HEMOGLOBIN A1C
Hgb A1c MFr Bld: 5.8 % — ABNORMAL HIGH (ref 4.8–5.6)
Mean Plasma Glucose: 119.76 mg/dL

## 2019-01-10 LAB — VITAMIN B12: Vitamin B-12: 417 pg/mL (ref 180–914)

## 2019-01-10 MED ORDER — ASPIRIN EC 81 MG PO TBEC
81.0000 mg | DELAYED_RELEASE_TABLET | Freq: Every day | ORAL | Status: DC
  Administered 2019-01-11 – 2019-01-14 (×4): 81 mg via ORAL
  Filled 2019-01-10 (×4): qty 1

## 2019-01-10 MED ORDER — SIMVASTATIN 20 MG PO TABS
40.0000 mg | ORAL_TABLET | Freq: Every day | ORAL | Status: DC
  Administered 2019-01-10 – 2019-01-13 (×4): 40 mg via ORAL
  Filled 2019-01-10 (×5): qty 2

## 2019-01-10 MED ORDER — ENSURE ENLIVE PO LIQD
237.0000 mL | Freq: Two times a day (BID) | ORAL | Status: DC
  Administered 2019-01-10 – 2019-01-14 (×2): 237 mL via ORAL

## 2019-01-10 MED ORDER — FERROUS SULFATE 325 (65 FE) MG PO TABS
325.0000 mg | ORAL_TABLET | Freq: Two times a day (BID) | ORAL | Status: DC
  Administered 2019-01-10 – 2019-01-14 (×8): 325 mg via ORAL
  Filled 2019-01-10 (×8): qty 1

## 2019-01-10 MED ORDER — CLOPIDOGREL BISULFATE 75 MG PO TABS
75.0000 mg | ORAL_TABLET | Freq: Every day | ORAL | Status: DC
  Administered 2019-01-10 – 2019-01-14 (×5): 75 mg via ORAL
  Filled 2019-01-10 (×5): qty 1

## 2019-01-10 NOTE — Progress Notes (Signed)
Initial Nutrition Assessment  DOCUMENTATION CODES:   Not applicable  INTERVENTION:  Continue Ensure Enlive po BID, each supplement provides 350 kcal and 20 grams of protein.  Encourage adequate PO intake.   Recommend obtaining new weight to fully assess weight trends.  NUTRITION DIAGNOSIS:   Increased nutrient needs related to acute illness as evidenced by estimated needs.  GOAL:   Patient will meet greater than or equal to 90% of their needs  MONITOR:   PO intake, Supplement acceptance, Labs, Weight trends, I & O's, Skin  REASON FOR ASSESSMENT:   Consult Assessment of nutrition requirement/status  ASSESSMENT:   85 year old with past medical history significant for GERD, HLD, scoliosis who presents with difficulty walking. Pt with Acute right parietal CVA.  Pt unavailable during time of visit. RD unable to obtain most recent nutrition history. Noted no recent weight recorded. Recommend obtaining new weight to fully assess weight trends. Pt currently has Ensure ordered. RD to continue with current orders to aid in caloric and protein needs.  Unable to complete Nutrition-Focused physical exam at this time.   Labs and medications reviewed.   Diet Order:   Diet Order            Diet NPO time specified  Diet effective midnight        Diet Heart Room service appropriate? Yes; Fluid consistency: Thin  Diet effective now              EDUCATION NEEDS:   Not appropriate for education at this time  Skin:  Skin Assessment: Reviewed RN Assessment  Last BM:  2/26  Height:   Ht Readings from Last 1 Encounters:  01/31/18 4\' 10"  (1.473 m)    Weight:   Wt Readings from Last 1 Encounters:  01/31/18 36.2 kg    Ideal Body Weight:  43.8 kg  BMI:  There is no height or weight on file to calculate BMI.  Estimated Nutritional Needs:   Kcal:  1300-1500  Protein:  65-75 grams  Fluid:  >/= 1.5 L/day    Roslyn Smiling, MS, RD, LDN Pager # [phone] After  hours/ weekend pager # [phone]

## 2019-01-10 NOTE — Progress Notes (Addendum)
STROKE TEAM PROGRESS NOTE   INTERVAL HISTORY Her daughter is at the bedside.  Patient neuro stable. Has been up with therapy in the halls. CIR recommended. Stroke appears embolic. For tee and loop on Monday.   Vitals:   01/09/19 2309 01/09/19 2333 01/10/19 0133 01/10/19 0341  BP: 125/60 105/61 129/60 129/60  Pulse: 68 65 66 61  Resp: (!) 22 19  20   Temp: 98.1 F (36.7 C)   98 F (36.7 C)  TempSrc: Oral   Oral  SpO2: 94% 94% 97% 97%    CBC:  Recent Labs  Lab 01/09/19 0844 01/10/19 0602  WBC 7.1 9.5  NEUTROABS 4.8 6.2  HGB 8.8* 8.5*  HCT 31.8* 29.7*  MCV 70.5* 68.4*  PLT 156 336    Basic Metabolic Panel:  Recent Labs  Lab 01/09/19 0844 01/10/19 0602  NA 138 139  K 3.7 3.6  CL 107 107  CO2 22 22  GLUCOSE 93 83  BUN 18 14  CREATININE 0.46 0.47  CALCIUM 8.4* 8.5*   Lipid Panel:     Component Value Date/Time   CHOL 158 01/10/2019 0602   TRIG 86 01/10/2019 0602   HDL 61 01/10/2019 0602   CHOLHDL 2.6 01/10/2019 0602   VLDL 17 01/10/2019 0602   LDLCALC 80 01/10/2019 0602   HgbA1c:  Lab Results  Component Value Date   HGBA1C 5.8 (H) 01/10/2019   Urine Drug Screen: No results found for: LABOPIA, COCAINSCRNUR, LABBENZ, AMPHETMU, THCU, LABBARB  Alcohol Level No results found for: ETH  IMAGING Ct Angio Head W Or Wo Contrast  Result Date: 01/09/2019 CLINICAL DATA:  Left-sided neglect. EXAM: CT ANGIOGRAPHY HEAD AND NECK CT PERFUSION BRAIN TECHNIQUE: Multidetector CT imaging of the head and neck was performed using the standard protocol during bolus administration of intravenous contrast. Multiplanar CT image reconstructions and MIPs were obtained to evaluate the vascular anatomy. Carotid stenosis measurements (when applicable) are obtained utilizing NASCET criteria, using the distal internal carotid diameter as the denominator. Multiphase CT imaging of the brain was performed following IV bolus contrast injection. Subsequent parametric perfusion maps were calculated  using RAPID software. CONTRAST:  ISOVUE-370 IOPAMIDOL (ISOVUE-370) INJECTION 76% COMPARISON:  None. FINDINGS: CTA NECK FINDINGS Aortic arch: Normal variant 4 vessel aortic arch with the left vertebral artery arising directly from the arch. Widely patent arch vessel origins. Right carotid system: Patent without evidence of stenosis or dissection. Mildly ectatic appearance of the mid to distal cervical ICA. Left carotid system: Patent without evidence of stenosis or dissection. Mildly ectatic appearance of the mid to distal cervical ICA. Vertebral arteries: Patent without evidence of significant stenosis or dissection. Codominant. Skeleton: Mild-to-moderate cervical disc degeneration. Right C4-5 facet ankylosis. Other neck: Subcentimeter bilateral thyroid nodules. Complete right maxillary sinus opacification. Upper chest: Mild pleural-parenchymal scarring in the lung apices. Review of the MIP images confirms the above findings CTA HEAD FINDINGS Anterior circulation: The internal carotid arteries are widely patent from skull base to carotid termini. ACAs and MCAs are patent without evidence of significant proximal stenosis. There is a distal right M2 occlusion corresponding to the acute right parietal infarct. No aneurysm is identified. Posterior circulation: The intracranial vertebral arteries are widely patent to the basilar. Patent left PICA, right AICA, and bilateral SCAs are visualized. The basilar artery is widely patent. There are predominantly fetal type origins of both PCAs without evidence of significant PCA stenosis. No aneurysm is identified. Venous sinuses: Patent. Anatomic variants: Fetal type origins of both PCAs. Review of  the MIP images confirms the above findings CT Brain Perfusion Findings: CBF (<30%) Volume: 76mL Perfusion (Tmax>6.0s) volume: 74mL Mismatch Volume: 75mL by automated RAPID processing, however the area of prolonged Tmax corresponds to the low-density established infarct on the  earlier noncontrast head CT without evidence of significant penumbra. Infarction Location: Right parietal lobe IMPRESSION: 1. Distal right M2 occlusion. 2. Associated right parietal infarct without evidence of significant penumbra by CTP. 3. No significant stenosis in the proximal intracranial or cervical arterial circulation. These results were communicated to Dr. Wilford Corner at 11:20 am on 01/09/2019 by text page via the [facility] messaging system. Electronically Signed   By: Sebastian Ache M.D.   On: 01/09/2019 11:32   Ct Head Wo Contrast  Result Date: 01/09/2019 CLINICAL DATA:  Focal neural deficit of greater than 6 hours suspected stroke, LEFT hemi spatial neglect on exam, imbalance, history of hypertension EXAM: CT HEAD WITHOUT CONTRAST TECHNIQUE: Contiguous axial images were obtained from the base of the skull through the vertex without intravenous contrast. Sagittal and coronal MPR images reconstructed from axial data set. COMPARISON:  None FINDINGS: Brain: Generalized atrophy. Normal ventricular morphology. No midline shift or mass effect. Small vessel chronic ischemic changes of deep cerebral white matter. Loss of gray-white differentiation at the RIGHT parietal lobe consistent with cortical infarct. No intracranial hemorrhage, mass lesion or extra-axial fluid collection. No additional infarcts identified. Vascular: No definite hyperdense vessels Skull: Demineralized with biparietal thinning.  No fractures. Sinuses/Orbits: Complete opacification of RIGHT maxillary sinus and LEFT sphenoid sinus. Remaining visualized paranasal sinuses and mastoid air cells clear Other: N/A IMPRESSION: Atrophy with small vessel chronic ischemic changes of deep cerebral white matter. Acute/subacute infarct of the RIGHT parietal lobe where loss of gray-white differentiation is seen. RIGHT maxillary and LEFT sphenoid sinus disease changes. Electronically Signed   By: Ulyses Southward M.D.   On: 01/09/2019 09:57   Ct Angio Neck W Or Wo  Contrast  Result Date: 01/09/2019 CLINICAL DATA:  Left-sided neglect. EXAM: CT ANGIOGRAPHY HEAD AND NECK CT PERFUSION BRAIN TECHNIQUE: Multidetector CT imaging of the head and neck was performed using the standard protocol during bolus administration of intravenous contrast. Multiplanar CT image reconstructions and MIPs were obtained to evaluate the vascular anatomy. Carotid stenosis measurements (when applicable) are obtained utilizing NASCET criteria, using the distal internal carotid diameter as the denominator. Multiphase CT imaging of the brain was performed following IV bolus contrast injection. Subsequent parametric perfusion maps were calculated using RAPID software. CONTRAST:  ISOVUE-370 IOPAMIDOL (ISOVUE-370) INJECTION 76% COMPARISON:  None. FINDINGS: CTA NECK FINDINGS Aortic arch: Normal variant 4 vessel aortic arch with the left vertebral artery arising directly from the arch. Widely patent arch vessel origins. Right carotid system: Patent without evidence of stenosis or dissection. Mildly ectatic appearance of the mid to distal cervical ICA. Left carotid system: Patent without evidence of stenosis or dissection. Mildly ectatic appearance of the mid to distal cervical ICA. Vertebral arteries: Patent without evidence of significant stenosis or dissection. Codominant. Skeleton: Mild-to-moderate cervical disc degeneration. Right C4-5 facet ankylosis. Other neck: Subcentimeter bilateral thyroid nodules. Complete right maxillary sinus opacification. Upper chest: Mild pleural-parenchymal scarring in the lung apices. Review of the MIP images confirms the above findings CTA HEAD FINDINGS Anterior circulation: The internal carotid arteries are widely patent from skull base to carotid termini. ACAs and MCAs are patent without evidence of significant proximal stenosis. There is a distal right M2 occlusion corresponding to the acute right parietal infarct. No aneurysm is identified. Posterior circulation:  The intracranial vertebral arteries are widely patent to the basilar. Patent left PICA, right AICA, and bilateral SCAs are visualized. The basilar artery is widely patent. There are predominantly fetal type origins of both PCAs without evidence of significant PCA stenosis. No aneurysm is identified. Venous sinuses: Patent. Anatomic variants: Fetal type origins of both PCAs. Review of the MIP images confirms the above findings CT Brain Perfusion Findings: CBF (<30%) Volume: 0mL Perfusion (Tmax>6.0s) volume: 31mL Mismatch Volume: 31mL by automated RAPID processing, however the area of prolonged Tmax corresponds to the low-density established infarct on the earlier noncontrast head CT without evidence of significant penumbra. Infarction Location: Right parietal lobe IMPRESSION: 1. Distal right M2 occlusion. 2. Associated right parietal infarct without evidence of significant penumbra by CTP. 3. No significant stenosis in the proximal intracranial or cervical arterial circulation. These results were communicated to Dr. Wilford Corner at 11:20 am on 01/09/2019 by text page via the [facility] messaging system. Electronically Signed   By: Sebastian Ache M.D.   On: 01/09/2019 11:32   Ct Cerebral Perfusion W Contrast  Result Date: 01/09/2019 CLINICAL DATA:  Left-sided neglect. EXAM: CT ANGIOGRAPHY HEAD AND NECK CT PERFUSION BRAIN TECHNIQUE: Multidetector CT imaging of the head and neck was performed using the standard protocol during bolus administration of intravenous contrast. Multiplanar CT image reconstructions and MIPs were obtained to evaluate the vascular anatomy. Carotid stenosis measurements (when applicable) are obtained utilizing NASCET criteria, using the distal internal carotid diameter as the denominator. Multiphase CT imaging of the brain was performed following IV bolus contrast injection. Subsequent parametric perfusion maps were calculated using RAPID software. CONTRAST:  ISOVUE-370 IOPAMIDOL (ISOVUE-370)  INJECTION 76% COMPARISON:  None. FINDINGS: CTA NECK FINDINGS Aortic arch: Normal variant 4 vessel aortic arch with the left vertebral artery arising directly from the arch. Widely patent arch vessel origins. Right carotid system: Patent without evidence of stenosis or dissection. Mildly ectatic appearance of the mid to distal cervical ICA. Left carotid system: Patent without evidence of stenosis or dissection. Mildly ectatic appearance of the mid to distal cervical ICA. Vertebral arteries: Patent without evidence of significant stenosis or dissection. Codominant. Skeleton: Mild-to-moderate cervical disc degeneration. Right C4-5 facet ankylosis. Other neck: Subcentimeter bilateral thyroid nodules. Complete right maxillary sinus opacification. Upper chest: Mild pleural-parenchymal scarring in the lung apices. Review of the MIP images confirms the above findings CTA HEAD FINDINGS Anterior circulation: The internal carotid arteries are widely patent from skull base to carotid termini. ACAs and MCAs are patent without evidence of significant proximal stenosis. There is a distal right M2 occlusion corresponding to the acute right parietal infarct. No aneurysm is identified. Posterior circulation: The intracranial vertebral arteries are widely patent to the basilar. Patent left PICA, right AICA, and bilateral SCAs are visualized. The basilar artery is widely patent. There are predominantly fetal type origins of both PCAs without evidence of significant PCA stenosis. No aneurysm is identified. Venous sinuses: Patent. Anatomic variants: Fetal type origins of both PCAs. Review of the MIP images confirms the above findings CT Brain Perfusion Findings: CBF (<30%) Volume: 0mL Perfusion (Tmax>6.0s) volume: 31mL Mismatch Volume: 31mL by automated RAPID processing, however the area of prolonged Tmax corresponds to the low-density established infarct on the earlier noncontrast head CT without evidence of significant penumbra.  Infarction Location: Right parietal lobe IMPRESSION: 1. Distal right M2 occlusion. 2. Associated right parietal infarct without evidence of significant penumbra by CTP. 3. No significant stenosis in the proximal intracranial or cervical arterial circulation. These results were communicated  to Dr. Wilford Corner at 11:20 am on 01/09/2019 by text page via the [facility] messaging system. Electronically Signed   By: Sebastian Ache M.D.   On: 01/09/2019 11:32    PHYSICAL EXAM Constitutional: elderly thin female who appears well-developed and well-nourished.  Psych: Affect appropriate to situation Eyes: No scleral injection HENT: No OP obstrucion Head: Normocephalic.  Cardiovascular: Normal rate and regular rhythm.  Respiratory: Effort normal, non-labored breathing Skin: WDI  Neuro: Mental Status: Patient is awake, alert, oriented to person, place, month, year, and situation. Patient is able to give a clear and coherent history. Neglect of left side Cranial Nerves: II: Visual Fields are full. III,IV, VI: EOMI without ptosis or diploplia.  Pupils are equal, round, and reactive to light.   V: Facial sensation is symmetric to temperature however to DSS neglected left side VII: Facial movement is symmetric.  VIII: hearing is intact to voice X: Uvula elevates symmetrically XI: Shoulder shrug is symmetric. XII: tongue is midline without atrophy or fasciculations.  Motor: Tone is normal. Bulk is normal. 5/5 strength was present in all four extremities. R hand orbits L Sensory: Sensation is symmetric to light touch and temperature in the arms and legs. DSS neglect left side Plantars: Toes are downgoing bilaterally.  Cerebellar: Unsteady FNF on the L. intact on the R   ASSESSMENT/PLAN Ms. Madison Odom is a 85 y.o. female with history of scoliosis, pelvis fracture, mitral valve prolapse, hyperlipidemia, and GERD who woke with imbalance. Brought to ED and found to have R sided neglect. Not a tPA candidate as  out of the window. No LVO found on imaging.  Stroke:  right parietal infarct embolic secondary to unknown source, suspicious for AF  CT head Small vessel disease. Atrophy. Acute R parietal lobe infarct. R maxillary and L sphenoid sinus dz  CTA head & neck distal R M2 occlusion.   CT perfusion R parietal infarct without penumbra  MRI  R parietal infarct. Formal reading pending   2D Echo  pending   TEE to look for embolic source. Arranged with [facility] for Monday.  If positive for PFO (patent foramen ovale), check bilateral lower extremity venous dopplers to rule out DVT as possible source of stroke. (I have made patient NPO after midnight tonight).  If TEE negative, a  [facility] electrophysiologist will consult and consider placement of an implantable loop recorder to evaluate for atrial fibrillation as etiology of stroke. This has been explained to patient/family by myself and they are agreeable.  EKG repeated this am d/t hx AF on prior EKGs. Machine auto read AF but MD who came behind removed dx and stated pt in sinus  LDL 80  HgbA1c 5.8  Heparin 5000 units sq tid for VTE prophylaxis  aspirin 81 mg daily prior to admission, now on aspirin 325 mg daily. Continue for now. Likely DAPT at d/c. Await TEE results  Therapy recommendations:  CIR. Consult requested  Disposition:  pending   Hyperlipidemia  Home meds:  zocor 20  Now on lipitor 80  LDL 80, goal < 70  Will decrease zocor dose to 40  Continue statin at discharge  Other Stroke Risk Factors  Advanced age  Other Active Problems  MVP - followed by Dr. Tobias Alexander  GERD  Scoliosis  Anemia - Hgb 8.5 w/ low Fe and Fer. For iron replacement  Discussed with Dr. Sunnie Nielsen and Dr. Roda Shutters.  Hospital day # 1  Annie Main, MSN, APRN, ANVP-BC,  AGPCNP-BC Advanced Practice Stroke Nurse [facility] See Amion for Schedule & Pager information 01/10/2019  1:43 PM   ATTENDING NOTE: I reviewed above note and agree with the assessment and plan. Pt was seen and examined.   85 year old female with history of hyperlipidemia, mitral valve prolapse admitted for gait imbalance and left neglect.  CT showed right parietal acute infarct.  CTA head and neck right distal M2 occlusion.  CT perfusion no significant penumbra due to pseudonormalization.  MRI showed right MCA parietal moderate infarct.  2D echo pending.  LDL 80 and A1c 5.8.  On exam, patient daughter at bedside, patient sitting in bed, awake alert orientated x3.  Fluent language, no aphasia, follows simple commands.  Able to name and repeat.  PERRL, EOMI.  Visual field to fall, however, left simultanagnosia present.  Facial symmetrical, tongue midline.  Moving all extremities symmetrical, sensation intact, coordination intact, gait not tested.  Patient stroke embolic pattern, highly suspicious for paroxysmal A. fib.  Will recommend TEE and loop recorder.  Patient does have anemia hemoglobin 8.5, anemia microcytic pattern, likely iron deficiency.  Patient and family admitted that she does have anemia 2 years ago, treated with iron and much improved.  However, her iron treatment has been stopped. I will consider patient still anticoagulation candidate with iron treatment and stabilization of anemia.  Patient currently on aspirin 325.  Will recommend aspirin 81 and Plavix 75 DAPT for 3 weeks and then Plavix alone.  Continue Zocor 40 for HLD and stroke prevention.  PT/OT recommend CIR.  Will follow.  Marvel Plan, MD PhD Stroke Neurology 01/10/2019 4:13 PM  I spent  35 minutes in total face-to-face time with the patient, more than 50% of which was spent in counseling and coordination of care, reviewing test results, images and medication, and discussing the diagnosis of right MCA infarct, treatment plan and potential prognosis. This patient's care requiresreview of multiple databases, neurological  assessment, discussion with family, other specialists and medical decision making of high complexity. I had long discussion with pt and daughter at bedside, updated pt current condition, treatment plan and potential prognosis. They expressed understanding and appreciation.      To contact Stroke Continuity provider, please refer to [email or web address]. After hours, contact General Neurology

## 2019-01-10 NOTE — Progress Notes (Signed)
Physical Therapy Treatment Patient Details Name: Madison Odom MRN: [id number] DOB: January 21, 1934 Today's Date: 01/10/2019    History of Present Illness Patient is a 85 y/o female presenting to the ED on 01/09/19 with primary complaints of imbalance. CT of head was obtained.  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 PMH significant for scoliosis, pelvis fracture, mitral valve prolapse, hyperlipidemia, and GERD.    PT Comments    Patient received in bed with daughter present. Agrees to walk. Reports she is feeling good. Requires min guard with bed mobility and cues for safety. Transfers with min guard. Patient ambulated 300 feet using rollator with min guard assist. Requires assist to get left hand properly on walker and to lock/unlock brake on the left. Otherwise she ambulated well in hallway without difficulty. Patient will continue to benefit from skilled PT to address her functional limitations.     Follow Up Recommendations  CIR     Equipment Recommendations  Other (comment)    Recommendations for Other Services       Precautions / Restrictions Precautions Precautions: Fall Restrictions Weight Bearing Restrictions: No    Mobility  Bed Mobility Overal bed mobility: Needs Assistance Bed Mobility: Supine to Sit;Sit to Supine     Supine to sit: Supervision Sit to supine: Min guard   General bed mobility comments: cues for safety  Transfers Overall transfer level: Needs assistance Equipment used: 4-wheeled walker Transfers: Sit to/from Stand Sit to Stand: Min guard Stand pivot transfers: Min guard       General transfer comment: decreased safety awareness, decreased attention to left side especially with UE.   Ambulation/Gait Ambulation/Gait assistance: Min guard   Assistive device: 4-wheeled walker Gait Pattern/deviations: Step-through pattern;Trunk flexed     General Gait Details: much improved ambulation this visit using rollator. No  LOB, no difficulty with object avoidance in hallway, did not veer to left.    Stairs             Wheelchair Mobility    Modified Rankin (Stroke Patients Only)       Balance Overall balance assessment: Needs assistance Sitting-balance support: Feet supported Sitting balance-Leahy Scale: Good Sitting balance - Comments: Able to doff and don socks with min A without need for A for balance   Standing balance support: Bilateral upper extremity supported Standing balance-Leahy Scale: Fair                              Cognition Arousal/Alertness: Awake/alert Behavior During Therapy: WFL for tasks assessed/performed Overall Cognitive Status: Impaired/Different from baseline                                 General Comments: Pt stated she gets up on right side of bed (both dtrs said it was her left), once I had her get up on left side of bed she stated "this is the side I get up on". Pt with difficulty with donning socks--she kept saying it was becasue of her toenails not because of her left hand (it was clear to me that it was becasue of both)      Exercises Other Exercises Other Exercises: assisted patient into bathroom at end of session. required min assist and cues inside bathroom due to inability to fit rollator in bathroom.  Difficulty finding paper which was on left side.     General  Comments        Pertinent Vitals/Pain Pain Assessment: No/denies pain    Home Living Family/patient expects to be discharged to:: Inpatient rehab Living Arrangements: Alone Available Help at Discharge: Family;Available 24 hours/day Type of Home: House Home Access: Stairs to enter   Home Layout: Two level;Other (Comment)(has chair lift for stairs) Home Equipment: Walker - 4 wheels;Shower seat;Grab bars - tub/shower Additional Comments: has 2 4 wheeled walkers one upstairs and one downstairs    Prior Function Level of Independence: Independent with assistive  device(s)      Comments: use of rollator for mobility; has been in OPPT as recent as of a couple days ago working on balance   PT Goals (current goals can now be found in the care plan section) Acute Rehab PT Goals Patient Stated Goal: to rehab then home PT Goal Formulation: With patient Time For Goal Achievement: 01/23/19 Potential to Achieve Goals: Good Progress towards PT goals: Progressing toward goals    Frequency    Min 4X/week      PT Plan Current plan remains appropriate    Co-evaluation              AM-PAC PT "6 Clicks" Mobility   Outcome Measure  Help needed turning from your back to your side while in a flat bed without using bedrails?: A Little Help needed moving from lying on your back to sitting on the side of a flat bed without using bedrails?: A Little Help needed moving to and from a bed to a chair (including a wheelchair)?: A Little Help needed standing up from a chair using your arms (e.g., wheelchair or bedside chair)?: A Little Help needed to walk in hospital room?: A Little Help needed climbing 3-5 steps with a railing? : A Lot 6 Click Score: 17    End of Session Equipment Utilized During Treatment: Gait belt Activity Tolerance: Patient tolerated treatment well Patient left: with bed alarm set;in bed;with call bell/phone within reach;with family/visitor present Nurse Communication: Mobility status PT Visit Diagnosis: Muscle weakness (generalized) (M62.81);Unsteadiness on feet (R26.81);Other abnormalities of gait and mobility (R26.89)     Time: 1310-1336 PT Time Calculation (min) (ACUTE ONLY): 26 min  Charges:  $Gait Training: 8-22 mins $Therapeutic Activity: 8-22 mins                     Marlen Koman, PT, GCS 01/10/19,2:23 PM

## 2019-01-10 NOTE — Evaluation (Signed)
Occupational Therapy Evaluation Patient Details Name: Madison Odom MRN: [id number] DOB: 10-02-1934 Today's Date: 01/10/2019    History of Present Illness Patient is a 85 y/o female presenting to the ED on 01/09/19 with primary complaints of imbalance. CT of head was obtained.  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 PMH significant for scoliosis, pelvis fracture, mitral valve prolapse, hyperlipidemia, and GERD.   Clinical Impression   This 85 yo female admitted with above presents to acute OT with decreased proprioception and ataxia with LUE as well as decreased perception in space with regard to use of RW, Decreased attention to left side, will attempt to use LUE functionally with bi-manual task but is not always successful. She normally is totally independent in her house at rollator level and currently is min A level with decreased awareness of her deficits. She will continue to benefit from acute OT with follow up OT on CIR.    Follow Up Recommendations  CIR;Supervision/Assistance - 24 hour    Equipment Recommendations  None recommended by OT       Precautions / Restrictions Precautions Precautions: Fall Restrictions Weight Bearing Restrictions: No      Mobility Bed Mobility Overal bed mobility: Needs Assistance Bed Mobility: Supine to Sit;Sit to Supine     Supine to sit: Supervision Sit to supine: Supervision      Transfers Overall transfer level: Needs assistance Equipment used: Rolling walker (2 wheeled) Transfers: Sit to/from Stand Sit to Stand: Min assist         General transfer comment: Min A for immediate standing balance and safety; poor attention to L side; runs into objects on left side with RW and needs cues/A to correct    Balance Overall balance assessment: Needs assistance Sitting-balance support: No upper extremity supported;Feet supported Sitting balance-Leahy Scale: Good Sitting balance - Comments: Able to doff and  don socks with min A without need for A for balance   Standing balance support: Bilateral upper extremity supported(during ambulation, no UE support for standing at sink to brush teeth) Standing balance-Leahy Scale: Poor                             ADL either performed or assessed with clinical judgement   ADL Overall ADL's : Needs assistance/impaired Eating/Feeding: Modified independent;Sitting Eating/Feeding Details (indicate cue type and reason): increased time due to LUE coordination and proprioception issues Grooming: Minimal assistance;Standing Grooming Details (indicate cue type and reason): increased time due to LUE coordination and proprioception issues, reached for cup with left hand and knocked it off the counter, decreased control to get toothpaste on toothbrush. She was able to find the items she need for brushing her teeth (all 3 on left) Upper Body Bathing: Set up;Supervision/ safety;Sitting Upper Body Bathing Details (indicate cue type and reason): increased time due to LUE coordination and proprioception issues Lower Body Bathing: Minimal assistance;Sit to/from stand Lower Body Bathing Details (indicate cue type and reason): increased time due to LUE coordination and proprioception issues Upper Body Dressing : Minimal assistance;Sitting Upper Body Dressing Details (indicate cue type and reason): increased time due to LUE coordination and proprioception issues Lower Body Dressing: Minimal assistance;Sit to/from stand Lower Body Dressing Details (indicate cue type and reason): increased time due to LUE coordination and proprioception issues Toilet Transfer: Minimal assistance;Ambulation;RW;Regular Toilet;Grab bars Toilet Transfer Details (indicate cue type and reason): A to [facility] walker due to running into things in room on  left side Toileting- Clothing Manipulation and Hygiene: Minimal assistance;Sit to/from stand               Vision Baseline  Vision/History: Wears glasses Wears Glasses: At all times Patient Visual Report: No change from baseline Vision Assessment?: Yes Eye Alignment: Within Functional Limits Ocular Range of Motion: Within Functional Limits Alignment/Gaze Preference: Within Defined Limits Tracking/Visual Pursuits: Able to track stimulus in all quads without difficulty Visual Fields: No apparent deficits Additional Comments: right eye ptosis at baseline, able to read paragraph in book without issues keeping book centered and head straight. Line bisection test, she started at midline and did all the ones to right then went back to left and did them from left of midline to far left--no pattern to how she crossed them off. She did get all of them. With clock drawing she only did numbers 1-3 when asked for it to show 3 o'clock and it was an oval not a circle. When asked to draw a the whole face of a clock she did, it again was an oval and numbers 1-11 with 8 o'clock with hands on 8 and 11 (the 11 was where the 12 should have been)            Pertinent Vitals/Pain Pain Assessment: No/denies pain     Hand Dominance Right   Extremity/Trunk Assessment Upper Extremity Assessment Upper Extremity Assessment: LUE deficits/detail LUE Deficits / Details: decreasd coordination and proprioception, LUE lags behind RUE with same activity LUE Sensation: decreased proprioception LUE Coordination: decreased fine motor;decreased gross motor           Communication Communication Communication: No difficulties   Cognition Arousal/Alertness: Awake/alert Behavior During Therapy: WFL for tasks assessed/performed Overall Cognitive Status: Impaired/Different from baseline                                 General Comments: Pt stated she gets up on right side of bed (both dtrs said it was her left), once I had her get up on left side of bed she stated "this is the side I get up on". Pt with difficulty with donning  socks--she kept saying it was becasue of her toenails not because of her left hand (it was clear to me that it was becasue of both)              Home Living Family/patient expects to be discharged to:: Inpatient rehab Living Arrangements: Alone Available Help at Discharge: Family;Available 24 hours/day Type of Home: House Home Access: Stairs to enter [employer] of Steps: 1   Home Layout: Two level;Other (Comment)(has chair lift for stairs)     Bathroom Shower/Tub: Other (comment)(walk in tub)   Bathroom Toilet: Standard     Home Equipment: Walker - 4 wheels;Shower seat;Grab bars - tub/shower   Additional Comments: has 2 4 wheeled walkers one upstairs and one downstairs      Prior Functioning/Environment Level of Independence: Independent with assistive device(s)        Comments: use of rollator for mobility; has been in OPPT as recent as of a couple days ago working on balance        OT Problem List: Decreased range of motion;Impaired balance (sitting and/or standing);Impaired UE functional use;Decreased coordination;Impaired vision/perception      OT Treatment/Interventions: Self-care/ADL training;Balance training;Therapeutic activities;Therapeutic exercise;DME and/or AE instruction;Patient/family education;Visual/perceptual remediation/compensation    OT Goals(Current goals can be found in the care plan  section) Acute Rehab OT Goals Patient Stated Goal: to rehab then home OT Goal Formulation: With patient/family Time For Goal Achievement: 01/24/19 Potential to Achieve Goals: Good  OT Frequency: Min 3X/week              AM-PAC OT "6 Clicks" Daily Activity     Outcome Measure Help from another person eating meals?: A Little Help from another person taking care of personal grooming?: A Little Help from another person toileting, which includes using toliet, bedpan, or urinal?: A Little Help from another person bathing (including washing, rinsing,  drying)?: A Little Help from another person to put on and taking off regular upper body clothing?: A Little Help from another person to put on and taking off regular lower body clothing?: A Little 6 Click Score: 18   End of Session Equipment Utilized During Treatment: Gait belt;Rolling walker Nurse Communication: (NT: pt wants purewick put in)  Activity Tolerance: Patient tolerated treatment well Patient left: in bed;with call bell/phone within reach;with bed alarm set;with family/visitor present  OT Visit Diagnosis: Unsteadiness on feet (R26.81);Other abnormalities of gait and mobility (R26.89);Muscle weakness (generalized) (M62.81);Ataxia, unspecified (R27.0)                Time: 4076-8088 OT Time Calculation (min): 61 min Charges:  OT General Charges $OT Visit: 1 Visit OT Evaluation $OT Eval Moderate Complexity: 1 Mod OT Treatments $Self Care/Home Management : 38-52 mins  Ignacia Palma, OTR/L Acute [employer] Pager [phone] Office [phone]    Evette Georges 01/10/2019, 11:20 AM

## 2019-01-10 NOTE — Progress Notes (Signed)
Patient only able to complete diffussion images due to back pain.

## 2019-01-10 NOTE — Progress Notes (Signed)
PROGRESS NOTE    Madison Odom  ZOX:[id number] DOB: 17-Feb-1934 DOA: 01/09/2019 PCP: Shirline FreesNafziger, Cory, NP    Brief Narrative: 85 year old with past medical history significant for GERD, HLD, scoliosis who presents with difficulty walking.  She reports that she actually has some difficulty walking at baseline but this morning she was having more difficulty with ambulation.  In the ED she was noted to have right side neglect.   Assessment & Plan:   Active Problems:   Acute CVA (cerebrovascular accident) (HCC)  Acute right parietal CVA; Acute/subacute infarct of the RIGHT parietal lobe MRI ; Acute ischemic infarct of the posterior right MCA territory, within the right parietal lobe. Echo pending LDL 80, globin A1c 5.8 On aspirin Plan for TEE on Monday. Monitor on telemetry.  HTN; continue with nadolol  GERD continue with PPI  Iron deficiency anemia: Start iron supplement. Iron 12, ferritin 7.   Estimated body mass index is 16.68 kg/m as calculated from the following:   Height as of 01/31/18: 4\' 10"  (1.473 m).   Weight as of 01/31/18: 36.2 kg.   DVT prophylaxis: heparin Code Status: full code Family Communication: daughter at bedside.  Disposition Plan: Needs TEE and CIR evaluation    Consultants:  Neurology   Procedures:   ECHO;  Doppler.   Antimicrobials:   none   Subjective: Alert, denies pain. Feels ok. Denies melena, hematochezia.   Objective: Vitals:   01/09/19 2309 01/09/19 2333 01/10/19 0133 01/10/19 0341  BP: 125/60 105/61 129/60 129/60  Pulse: 68 65 66 61  Resp: (!) 22 19  20   Temp: 98.1 F (36.7 C)   98 F (36.7 C)  TempSrc: Oral   Oral  SpO2: 94% 94% 97% 97%   No intake or output data in the 24 hours ending 01/10/19 1514 There were no vitals filed for this visit.  Examination:  General exam: Appears calm and comfortable  Respiratory system: Clear to auscultation. Respiratory effort normal. Cardiovascular system: S1 & S2 heard, RRR.  No JVD, murmurs, rubs, gallops or clicks. No pedal edema. Gastrointestinal system: Abdomen is nondistended, soft and nontender. No organomegaly or masses felt. Normal bowel sounds heard. Central nervous system: Alert and oriented. Unsteady FNF on the left  Extremities: Symmetric 5 x 5 power. Skin: No rashes, lesions or ulcers    Data Reviewed: I have personally reviewed following labs and imaging studies  CBC: Recent Labs  Lab 01/09/19 0844 01/10/19 0602  WBC 7.1 9.5  NEUTROABS 4.8 6.2  HGB 8.8* 8.5*  HCT 31.8* 29.7*  MCV 70.5* 68.4*  PLT 156 336   Basic Metabolic Panel: Recent Labs  Lab 01/09/19 0844 01/10/19 0602  NA 138 139  K 3.7 3.6  CL 107 107  CO2 22 22  GLUCOSE 93 83  BUN 18 14  CREATININE 0.46 0.47  CALCIUM 8.4* 8.5*   GFR: CrCl cannot be calculated (Unknown ideal weight.). Liver Function Tests: Recent Labs  Lab 01/10/19 0602  AST 25  ALT 15  ALKPHOS 51  BILITOT 0.5  PROT 5.8*  ALBUMIN 3.1*   No results for input(s): LIPASE, AMYLASE in the last 168 hours. No results for input(s): AMMONIA in the last 168 hours. Coagulation Profile: No results for input(s): INR, PROTIME in the last 168 hours. Cardiac Enzymes: No results for input(s): CKTOTAL, CKMB, CKMBINDEX, TROPONINI in the last 168 hours. BNP (last 3 results) No results for input(s): PROBNP in the last 8760 hours. HbA1C: Recent Labs    01/10/19 0602  HGBA1C  5.8*   CBG: Recent Labs  Lab 01/10/19 0733  GLUCAP 80   Lipid Profile: Recent Labs    01/10/19 0602  CHOL 158  HDL 61  LDLCALC 80  TRIG 86  CHOLHDL 2.6   Thyroid Function Tests: No results for input(s): TSH, T4TOTAL, FREET4, T3FREE, THYROIDAB in the last 72 hours. Anemia Panel: Recent Labs    01/10/19 0602  VITAMINB12 417  FERRITIN 7*  TIBC 441  IRON 12*   Sepsis Labs: No results for input(s): PROCALCITON, LATICACIDVEN in the last 168 hours.  No results found for this or any previous visit (from the past 240  hour(s)).       Radiology Studies: Ct Angio Head W Or Wo Contrast  Result Date: 01/09/2019 CLINICAL DATA:  Left-sided neglect. EXAM: CT ANGIOGRAPHY HEAD AND NECK CT PERFUSION BRAIN TECHNIQUE: Multidetector CT imaging of the head and neck was performed using the standard protocol during bolus administration of intravenous contrast. Multiplanar CT image reconstructions and MIPs were obtained to evaluate the vascular anatomy. Carotid stenosis measurements (when applicable) are obtained utilizing NASCET criteria, using the distal internal carotid diameter as the denominator. Multiphase CT imaging of the brain was performed following IV bolus contrast injection. Subsequent parametric perfusion maps were calculated using RAPID software. CONTRAST:  ISOVUE-370 IOPAMIDOL (ISOVUE-370) INJECTION 76% COMPARISON:  None. FINDINGS: CTA NECK FINDINGS Aortic arch: Normal variant 4 vessel aortic arch with the left vertebral artery arising directly from the arch. Widely patent arch vessel origins. Right carotid system: Patent without evidence of stenosis or dissection. Mildly ectatic appearance of the mid to distal cervical ICA. Left carotid system: Patent without evidence of stenosis or dissection. Mildly ectatic appearance of the mid to distal cervical ICA. Vertebral arteries: Patent without evidence of significant stenosis or dissection. Codominant. Skeleton: Mild-to-moderate cervical disc degeneration. Right C4-5 facet ankylosis. Other neck: Subcentimeter bilateral thyroid nodules. Complete right maxillary sinus opacification. Upper chest: Mild pleural-parenchymal scarring in the lung apices. Review of the MIP images confirms the above findings CTA HEAD FINDINGS Anterior circulation: The internal carotid arteries are widely patent from skull base to carotid termini. ACAs and MCAs are patent without evidence of significant proximal stenosis. There is a distal right M2 occlusion corresponding to the acute right  parietal infarct. No aneurysm is identified. Posterior circulation: The intracranial vertebral arteries are widely patent to the basilar. Patent left PICA, right AICA, and bilateral SCAs are visualized. The basilar artery is widely patent. There are predominantly fetal type origins of both PCAs without evidence of significant PCA stenosis. No aneurysm is identified. Venous sinuses: Patent. Anatomic variants: Fetal type origins of both PCAs. Review of the MIP images confirms the above findings CT Brain Perfusion Findings: CBF (<30%) Volume: 61mL Perfusion (Tmax>6.0s) volume: 43mL Mismatch Volume: 92mL by automated RAPID processing, however the area of prolonged Tmax corresponds to the low-density established infarct on the earlier noncontrast head CT without evidence of significant penumbra. Infarction Location: Right parietal lobe IMPRESSION: 1. Distal right M2 occlusion. 2. Associated right parietal infarct without evidence of significant penumbra by CTP. 3. No significant stenosis in the proximal intracranial or cervical arterial circulation. These results were communicated to Dr. Wilford Corner at 11:20 am on 01/09/2019 by text page via the [facility] messaging system. Electronically Signed   By: Sebastian Ache M.D.   On: 01/09/2019 11:32   Ct Head Wo Contrast  Result Date: 01/09/2019 CLINICAL DATA:  Focal neural deficit of greater than 6 hours suspected stroke, LEFT hemi spatial neglect on exam,  imbalance, history of hypertension EXAM: CT HEAD WITHOUT CONTRAST TECHNIQUE: Contiguous axial images were obtained from the base of the skull through the vertex without intravenous contrast. Sagittal and coronal MPR images reconstructed from axial data set. COMPARISON:  None FINDINGS: Brain: Generalized atrophy. Normal ventricular morphology. No midline shift or mass effect. Small vessel chronic ischemic changes of deep cerebral white matter. Loss of gray-white differentiation at the RIGHT parietal lobe consistent with cortical  infarct. No intracranial hemorrhage, mass lesion or extra-axial fluid collection. No additional infarcts identified. Vascular: No definite hyperdense vessels Skull: Demineralized with biparietal thinning.  No fractures. Sinuses/Orbits: Complete opacification of RIGHT maxillary sinus and LEFT sphenoid sinus. Remaining visualized paranasal sinuses and mastoid air cells clear Other: N/A IMPRESSION: Atrophy with small vessel chronic ischemic changes of deep cerebral white matter. Acute/subacute infarct of the RIGHT parietal lobe where loss of gray-white differentiation is seen. RIGHT maxillary and LEFT sphenoid sinus disease changes. Electronically Signed   By: Ulyses Southward M.D.   On: 01/09/2019 09:57   Ct Angio Neck W Or Wo Contrast  Result Date: 01/09/2019 CLINICAL DATA:  Left-sided neglect. EXAM: CT ANGIOGRAPHY HEAD AND NECK CT PERFUSION BRAIN TECHNIQUE: Multidetector CT imaging of the head and neck was performed using the standard protocol during bolus administration of intravenous contrast. Multiplanar CT image reconstructions and MIPs were obtained to evaluate the vascular anatomy. Carotid stenosis measurements (when applicable) are obtained utilizing NASCET criteria, using the distal internal carotid diameter as the denominator. Multiphase CT imaging of the brain was performed following IV bolus contrast injection. Subsequent parametric perfusion maps were calculated using RAPID software. CONTRAST:  ISOVUE-370 IOPAMIDOL (ISOVUE-370) INJECTION 76% COMPARISON:  None. FINDINGS: CTA NECK FINDINGS Aortic arch: Normal variant 4 vessel aortic arch with the left vertebral artery arising directly from the arch. Widely patent arch vessel origins. Right carotid system: Patent without evidence of stenosis or dissection. Mildly ectatic appearance of the mid to distal cervical ICA. Left carotid system: Patent without evidence of stenosis or dissection. Mildly ectatic appearance of the mid to distal cervical ICA.  Vertebral arteries: Patent without evidence of significant stenosis or dissection. Codominant. Skeleton: Mild-to-moderate cervical disc degeneration. Right C4-5 facet ankylosis. Other neck: Subcentimeter bilateral thyroid nodules. Complete right maxillary sinus opacification. Upper chest: Mild pleural-parenchymal scarring in the lung apices. Review of the MIP images confirms the above findings CTA HEAD FINDINGS Anterior circulation: The internal carotid arteries are widely patent from skull base to carotid termini. ACAs and MCAs are patent without evidence of significant proximal stenosis. There is a distal right M2 occlusion corresponding to the acute right parietal infarct. No aneurysm is identified. Posterior circulation: The intracranial vertebral arteries are widely patent to the basilar. Patent left PICA, right AICA, and bilateral SCAs are visualized. The basilar artery is widely patent. There are predominantly fetal type origins of both PCAs without evidence of significant PCA stenosis. No aneurysm is identified. Venous sinuses: Patent. Anatomic variants: Fetal type origins of both PCAs. Review of the MIP images confirms the above findings CT Brain Perfusion Findings: CBF (<30%) Volume: 0mL Perfusion (Tmax>6.0s) volume: 31mL Mismatch Volume: 31mL by automated RAPID processing, however the area of prolonged Tmax corresponds to the low-density established infarct on the earlier noncontrast head CT without evidence of significant penumbra. Infarction Location: Right parietal lobe IMPRESSION: 1. Distal right M2 occlusion. 2. Associated right parietal infarct without evidence of significant penumbra by CTP. 3. No significant stenosis in the proximal intracranial or cervical arterial circulation. These results were communicated to  Dr. Wilford Corner at 11:20 am on 01/09/2019 by text page via the [facility] messaging system. Electronically Signed   By: Sebastian Ache M.D.   On: 01/09/2019 11:32   Mr Brain Wo Contrast  Result  Date: 01/10/2019 CLINICAL DATA:  Stroke follow-up. EXAM: MRI HEAD WITHOUT CONTRAST TECHNIQUE: Multiplanar, multiecho pulse sequences of the brain and surrounding structures were obtained without intravenous contrast. COMPARISON:  Head CT 01/09/2019 FINDINGS: The examination was discontinued prematurely due to patient pain. Only axial and coronal diffusion-weighted imaging was obtained. There is a intermediate sized area of acute ischemia within the posterior right MCA territory, within the right parietal lobe. The area of ischemia is roughly equal to that indicated on the perfusion scan. There is no contralateral ischemia or ischemia within a different vascular territory. There is no midline shift. IMPRESSION: 1. Truncated examination due to patient discomfort. 2. Acute ischemic infarct of the posterior right MCA territory, within the right parietal lobe. Electronically Signed   By: Deatra Robinson M.D.   On: 01/10/2019 14:28   Ct Cerebral Perfusion W Contrast  Result Date: 01/09/2019 CLINICAL DATA:  Left-sided neglect. EXAM: CT ANGIOGRAPHY HEAD AND NECK CT PERFUSION BRAIN TECHNIQUE: Multidetector CT imaging of the head and neck was performed using the standard protocol during bolus administration of intravenous contrast. Multiplanar CT image reconstructions and MIPs were obtained to evaluate the vascular anatomy. Carotid stenosis measurements (when applicable) are obtained utilizing NASCET criteria, using the distal internal carotid diameter as the denominator. Multiphase CT imaging of the brain was performed following IV bolus contrast injection. Subsequent parametric perfusion maps were calculated using RAPID software. CONTRAST:  ISOVUE-370 IOPAMIDOL (ISOVUE-370) INJECTION 76% COMPARISON:  None. FINDINGS: CTA NECK FINDINGS Aortic arch: Normal variant 4 vessel aortic arch with the left vertebral artery arising directly from the arch. Widely patent arch vessel origins. Right carotid system: Patent without  evidence of stenosis or dissection. Mildly ectatic appearance of the mid to distal cervical ICA. Left carotid system: Patent without evidence of stenosis or dissection. Mildly ectatic appearance of the mid to distal cervical ICA. Vertebral arteries: Patent without evidence of significant stenosis or dissection. Codominant. Skeleton: Mild-to-moderate cervical disc degeneration. Right C4-5 facet ankylosis. Other neck: Subcentimeter bilateral thyroid nodules. Complete right maxillary sinus opacification. Upper chest: Mild pleural-parenchymal scarring in the lung apices. Review of the MIP images confirms the above findings CTA HEAD FINDINGS Anterior circulation: The internal carotid arteries are widely patent from skull base to carotid termini. ACAs and MCAs are patent without evidence of significant proximal stenosis. There is a distal right M2 occlusion corresponding to the acute right parietal infarct. No aneurysm is identified. Posterior circulation: The intracranial vertebral arteries are widely patent to the basilar. Patent left PICA, right AICA, and bilateral SCAs are visualized. The basilar artery is widely patent. There are predominantly fetal type origins of both PCAs without evidence of significant PCA stenosis. No aneurysm is identified. Venous sinuses: Patent. Anatomic variants: Fetal type origins of both PCAs. Review of the MIP images confirms the above findings CT Brain Perfusion Findings: CBF (<30%) Volume: 0mL Perfusion (Tmax>6.0s) volume: 31mL Mismatch Volume: 31mL by automated RAPID processing, however the area of prolonged Tmax corresponds to the low-density established infarct on the earlier noncontrast head CT without evidence of significant penumbra. Infarction Location: Right parietal lobe IMPRESSION: 1. Distal right M2 occlusion. 2. Associated right parietal infarct without evidence of significant penumbra by CTP. 3. No significant stenosis in the proximal intracranial or cervical arterial  circulation. These results were  communicated to Dr. Wilford Corner at 11:20 am on 01/09/2019 by text page via the [facility] messaging system. Electronically Signed   By: Sebastian Ache M.D.   On: 01/09/2019 11:32        Scheduled Meds: . aspirin  300 mg Rectal Daily   Or  . aspirin  325 mg Oral Daily  . feeding supplement (ENSURE ENLIVE)  237 mL Oral BID BM  . ferrous sulfate  325 mg Oral BID WC  . heparin  5,000 Units Subcutaneous Q8H  . [START ON 01/11/2019] nadolol  40 mg Oral Daily  . pantoprazole  40 mg Oral Daily  . simvastatin  40 mg Oral q1800   Continuous Infusions: . sodium chloride 50 mL/hr at 01/10/19 1022     LOS: 1 day    Time spent: 35 minutes.     Alba Cory, MD [facility]  01/10/2019, 3:14 PM

## 2019-01-10 NOTE — Progress Notes (Signed)
  Speech Language Pathology   Patient Details Name: Madison Odom MRN: [id number] DOB: [birth year] Today's Date: 01/10/2019 Time: 7782-      Pt screened for swallow and speech-cognition. She passed [facility] swallow; pt and 2 daughters deny previous or present dysphagia. Consumed breakfast without difficulty. She also lives alone however does not report any cognitive changes in any areas- daughter's agree that she appears capable for management of ADL's (medicine, finances etc) without assist from a cognitive perspective. OT/PT are following and can notify ST if higher level/executive functions are impaired. Informally, SLP did not pick up on need for formal assessment- pt/family in agreement.    Breck Coons Arvin.Ed Nurse, children's [phone] Office [phone]

## 2019-01-11 ENCOUNTER — Other Ambulatory Visit (HOSPITAL_COMMUNITY): Payer: Medicare Other

## 2019-01-11 ENCOUNTER — Inpatient Hospital Stay (HOSPITAL_COMMUNITY): Payer: Medicare Other

## 2019-01-11 DIAGNOSIS — I361 Nonrheumatic tricuspid (valve) insufficiency: Secondary | ICD-10-CM

## 2019-01-11 DIAGNOSIS — I351 Nonrheumatic aortic (valve) insufficiency: Secondary | ICD-10-CM

## 2019-01-11 LAB — ECHOCARDIOGRAM COMPLETE

## 2019-01-11 LAB — CBC WITH DIFFERENTIAL/PLATELET
Abs Immature Granulocytes: 0.02 10*3/uL (ref 0.00–0.07)
BASOS ABS: 0 10*3/uL (ref 0.0–0.1)
Basophils Relative: 0 %
Eosinophils Absolute: 0.1 10*3/uL (ref 0.0–0.5)
Eosinophils Relative: 1 %
HCT: 29.7 % — ABNORMAL LOW (ref 36.0–46.0)
HEMOGLOBIN: 8.5 g/dL — AB (ref 12.0–15.0)
Immature Granulocytes: 0 %
LYMPHS PCT: 23 %
Lymphs Abs: 2.2 10*3/uL (ref 0.7–4.0)
MCH: 19.6 pg — ABNORMAL LOW (ref 26.0–34.0)
MCHC: 28.6 g/dL — ABNORMAL LOW (ref 30.0–36.0)
MCV: 68.4 fL — ABNORMAL LOW (ref 80.0–100.0)
Monocytes Absolute: 1 10*3/uL (ref 0.1–1.0)
Monocytes Relative: 11 %
Neutro Abs: 6.2 10*3/uL (ref 1.7–7.7)
Neutrophils Relative %: 65 %
Platelets: 336 10*3/uL (ref 150–400)
RBC: 4.34 MIL/uL (ref 3.87–5.11)
RDW: 19.1 % — ABNORMAL HIGH (ref 11.5–15.5)
WBC: 9.5 10*3/uL (ref 4.0–10.5)
nRBC: 0 % (ref 0.0–0.2)

## 2019-01-11 LAB — GLUCOSE, CAPILLARY
Glucose-Capillary: 148 mg/dL — ABNORMAL HIGH (ref 70–99)
Glucose-Capillary: 56 mg/dL — ABNORMAL LOW (ref 70–99)
Glucose-Capillary: 98 mg/dL (ref 70–99)

## 2019-01-11 MED ORDER — POLYETHYLENE GLYCOL 3350 17 G PO PACK
17.0000 g | PACK | Freq: Every day | ORAL | Status: DC
  Administered 2019-01-11 – 2019-01-12 (×2): 17 g via ORAL
  Filled 2019-01-11 (×2): qty 1

## 2019-01-11 MED ORDER — MELATONIN 3 MG PO TABS
3.0000 mg | ORAL_TABLET | Freq: Every evening | ORAL | Status: DC | PRN
  Administered 2019-01-12: 3 mg via ORAL
  Filled 2019-01-11 (×5): qty 1

## 2019-01-11 NOTE — Progress Notes (Signed)
PROGRESS NOTE    Madison Odom  NWG:[id number] DOB: October 03, 1934 DOA: 01/09/2019 PCP: [name], NP    Brief Narrative: 85 year old with past medical history significant for GERD, HLD, scoliosis who presents with difficulty walking.  She reports that she actually has some difficulty walking at baseline but this morning she was having more difficulty with ambulation.  In the ED she was noted to have right side neglect.   Assessment & Plan:   Active Problems:   Acute CVA (cerebrovascular accident) (HCC)  Acute right parietal CVA; Acute/subacute infarct of the RIGHT parietal lobe MRI ; Acute ischemic infarct of the posterior right MCA territory, within the right parietal lobe. Echo pending LDL 80, globin A1c 5.8 On aspirin and plavix Plan for TEE on Monday. Will need loop recorder.  Monitor on telemetry.  HTN; continue with nadolol  GERD continue with PPI  Iron deficiency anemia: Started  iron supplement. Iron 12, ferritin 7. Repeat hb in am.   Estimated body mass index is 16.68 kg/m as calculated from the following:   Height as of 01/31/18:  (1.473 m).   Weight as of 01/31/18: 36.2 kg.   DVT prophylaxis: heparin Code Status: full code Family Communication: daughter at bedside.  Disposition Plan: Needs TEE and CIR evaluation    Consultants:  Neurology   Procedures:   ECHO;  Doppler.   Antimicrobials:   none   Subjective: Feeling ok, denies new symptoms.   Objective: Vitals:   01/10/19 1529 01/10/19 2018 01/11/19 0002 01/11/19 0426  BP: (!) 122/56 113/64 (!) 122/43 134/60  Pulse: 66 79 65 64  Resp:  (!) 21 20 (!) 21  Temp: 98.6 F (37 C) 98.6 F (37 C) 98.2 F (36.8 C) 97.8 F (36.6 C)  TempSrc: Oral Oral Oral Oral  SpO2: 95% 95% 96% 94%   No intake or output data in the 24 hours ending 01/11/19 0900 There were no vitals filed for this visit.  Examination:  General exam:NAD Respiratory system: CTA Cardiovascular system: S 1, S 2  RRR Gastrointestinal system: BS present, soft, nt Central nervous system: alert and oriented Extremities: symmetric power.  Skin: No rashes, lesions or ulcers    Data Reviewed: I have personally reviewed following labs and imaging studies  CBC: Recent Labs  Lab 01/09/19 0844 01/10/19 0602  WBC 7.1 9.5  NEUTROABS 4.8 6.2  HGB 8.8* 8.5*  HCT 31.8* 29.7*  MCV 70.5* 68.4*  PLT 156 336   Basic Metabolic Panel: Recent Labs  Lab 01/09/19 0844 01/10/19 0602  NA 138 139  K 3.7 3.6  CL 107 107  CO2 22 22  GLUCOSE 93 83  BUN 18 14  CREATININE 0.46 0.47  CALCIUM 8.4* 8.5*   GFR: CrCl cannot be calculated (Unknown ideal weight.). Liver Function Tests: Recent Labs  Lab 01/10/19 0602  AST 25  ALT 15  ALKPHOS 51  BILITOT 0.5  PROT 5.8*  ALBUMIN 3.1*   No results for input(s): LIPASE, AMYLASE in the last 168 hours. No results for input(s): AMMONIA in the last 168 hours. Coagulation Profile: No results for input(s): INR, PROTIME in the last 168 hours. Cardiac Enzymes: No results for input(s): CKTOTAL, CKMB, CKMBINDEX, TROPONINI in the last 168 hours. BNP (last 3 results) No results for input(s): PROBNP in the last 8760 hours. HbA1C: Recent Labs    01/10/19 0602  HGBA1C 5.8*   CBG: Recent Labs  Lab 01/10/19 0733 01/11/19 0003 01/11/19 0116  GLUCAP 80 56* 148*  Lipid Profile: Recent Labs    01/10/19 0602  CHOL 158  HDL 61  LDLCALC 80  TRIG 86  CHOLHDL 2.6   Thyroid Function Tests: No results for input(s): TSH, T4TOTAL, FREET4, T3FREE, THYROIDAB in the last 72 hours. Anemia Panel: Recent Labs    01/10/19 0602  VITAMINB12 417  FERRITIN 7*  TIBC 441  IRON 12*   Sepsis Labs: No results for input(s): PROCALCITON, LATICACIDVEN in the last 168 hours.  No results found for this or any previous visit (from the past 240 hour(s)).       Radiology Studies: Ct Angio Head W Or Wo Contrast  Result Date: 01/09/2019 CLINICAL DATA:  Left-sided  neglect. EXAM: CT ANGIOGRAPHY HEAD AND NECK CT PERFUSION BRAIN TECHNIQUE: Multidetector CT imaging of the head and neck was performed using the standard protocol during bolus administration of intravenous contrast. Multiplanar CT image reconstructions and MIPs were obtained to evaluate the vascular anatomy. Carotid stenosis measurements (when applicable) are obtained utilizing NASCET criteria, using the distal internal carotid diameter as the denominator. Multiphase CT imaging of the brain was performed following IV bolus contrast injection. Subsequent parametric perfusion maps were calculated using RAPID software. CONTRAST:  ISOVUE-370 IOPAMIDOL (ISOVUE-370) INJECTION 76% COMPARISON:  None. FINDINGS: CTA NECK FINDINGS Aortic arch: Normal variant 4 vessel aortic arch with the left vertebral artery arising directly from the arch. Widely patent arch vessel origins. Right carotid system: Patent without evidence of stenosis or dissection. Mildly ectatic appearance of the mid to distal cervical ICA. Left carotid system: Patent without evidence of stenosis or dissection. Mildly ectatic appearance of the mid to distal cervical ICA. Vertebral arteries: Patent without evidence of significant stenosis or dissection. Codominant. Skeleton: Mild-to-moderate cervical disc degeneration. Right C4-5 facet ankylosis. Other neck: Subcentimeter bilateral thyroid nodules. Complete right maxillary sinus opacification. Upper chest: Mild pleural-parenchymal scarring in the lung apices. Review of the MIP images confirms the above findings CTA HEAD FINDINGS Anterior circulation: The internal carotid arteries are widely patent from skull base to carotid termini. ACAs and MCAs are patent without evidence of significant proximal stenosis. There is a distal right M2 occlusion corresponding to the acute right parietal infarct. No aneurysm is identified. Posterior circulation: The intracranial vertebral arteries are widely patent to the  basilar. Patent left PICA, right AICA, and bilateral SCAs are visualized. The basilar artery is widely patent. There are predominantly fetal type origins of both PCAs without evidence of significant PCA stenosis. No aneurysm is identified. Venous sinuses: Patent. Anatomic variants: Fetal type origins of both PCAs. Review of the MIP images confirms the above findings CT Brain Perfusion Findings: CBF (<30%) Volume: 43mL Perfusion (Tmax>6.0s) volume: 38mL Mismatch Volume: 88mL by automated RAPID processing, however the area of prolonged Tmax corresponds to the low-density established infarct on the earlier noncontrast head CT without evidence of significant penumbra. Infarction Location: Right parietal lobe IMPRESSION: 1. Distal right M2 occlusion. 2. Associated right parietal infarct without evidence of significant penumbra by CTP. 3. No significant stenosis in the proximal intracranial or cervical arterial circulation. These results were communicated to Dr. Wilford Corner at 11:20 am on 01/09/2019 by text page via the [facility] messaging system. Electronically Signed   By: Sebastian Ache M.D.   On: 01/09/2019 11:32   Ct Head Wo Contrast  Result Date: 01/09/2019 CLINICAL DATA:  Focal neural deficit of greater than 6 hours suspected stroke, LEFT hemi spatial neglect on exam, imbalance, history of hypertension EXAM: CT HEAD WITHOUT CONTRAST TECHNIQUE: Contiguous axial images were obtained  from the base of the skull through the vertex without intravenous contrast. Sagittal and coronal MPR images reconstructed from axial data set. COMPARISON:  None FINDINGS: Brain: Generalized atrophy. Normal ventricular morphology. No midline shift or mass effect. Small vessel chronic ischemic changes of deep cerebral white matter. Loss of gray-white differentiation at the RIGHT parietal lobe consistent with cortical infarct. No intracranial hemorrhage, mass lesion or extra-axial fluid collection. No additional infarcts identified. Vascular: No  definite hyperdense vessels Skull: Demineralized with biparietal thinning.  No fractures. Sinuses/Orbits: Complete opacification of RIGHT maxillary sinus and LEFT sphenoid sinus. Remaining visualized paranasal sinuses and mastoid air cells clear Other: N/A IMPRESSION: Atrophy with small vessel chronic ischemic changes of deep cerebral white matter. Acute/subacute infarct of the RIGHT parietal lobe where loss of gray-white differentiation is seen. RIGHT maxillary and LEFT sphenoid sinus disease changes. Electronically Signed   By: Ulyses SouthwardMark  Boles M.D.   On: 01/09/2019 09:57   Ct Angio Neck W Or Wo Contrast  Result Date: 01/09/2019 CLINICAL DATA:  Left-sided neglect. EXAM: CT ANGIOGRAPHY HEAD AND NECK CT PERFUSION BRAIN TECHNIQUE: Multidetector CT imaging of the head and neck was performed using the standard protocol during bolus administration of intravenous contrast. Multiplanar CT image reconstructions and MIPs were obtained to evaluate the vascular anatomy. Carotid stenosis measurements (when applicable) are obtained utilizing NASCET criteria, using the distal internal carotid diameter as the denominator. Multiphase CT imaging of the brain was performed following IV bolus contrast injection. Subsequent parametric perfusion maps were calculated using RAPID software. CONTRAST:  100mL ISOVUE-370 IOPAMIDOL (ISOVUE-370) INJECTION 76% COMPARISON:  None. FINDINGS: CTA NECK FINDINGS Aortic arch: Normal variant 4 vessel aortic arch with the left vertebral artery arising directly from the arch. Widely patent arch vessel origins. Right carotid system: Patent without evidence of stenosis or dissection. Mildly ectatic appearance of the mid to distal cervical ICA. Left carotid system: Patent without evidence of stenosis or dissection. Mildly ectatic appearance of the mid to distal cervical ICA. Vertebral arteries: Patent without evidence of significant stenosis or dissection. Codominant. Skeleton: Mild-to-moderate cervical disc  degeneration. Right C4-5 facet ankylosis. Other neck: Subcentimeter bilateral thyroid nodules. Complete right maxillary sinus opacification. Upper chest: Mild pleural-parenchymal scarring in the lung apices. Review of the MIP images confirms the above findings CTA HEAD FINDINGS Anterior circulation: The internal carotid arteries are widely patent from skull base to carotid termini. ACAs and MCAs are patent without evidence of significant proximal stenosis. There is a distal right M2 occlusion corresponding to the acute right parietal infarct. No aneurysm is identified. Posterior circulation: The intracranial vertebral arteries are widely patent to the basilar. Patent left PICA, right AICA, and bilateral SCAs are visualized. The basilar artery is widely patent. There are predominantly fetal type origins of both PCAs without evidence of significant PCA stenosis. No aneurysm is identified. Venous sinuses: Patent. Anatomic variants: Fetal type origins of both PCAs. Review of the MIP images confirms the above findings CT Brain Perfusion Findings: CBF (<30%) Volume: 0mL Perfusion (Tmax>6.0s) volume: 31mL Mismatch Volume: 31mL by automated RAPID processing, however the area of prolonged Tmax corresponds to the low-density established infarct on the earlier noncontrast head CT without evidence of significant penumbra. Infarction Location: Right parietal lobe IMPRESSION: 1. Distal right M2 occlusion. 2. Associated right parietal infarct without evidence of significant penumbra by CTP. 3. No significant stenosis in the proximal intracranial or cervical arterial circulation. These results were communicated to Dr. Wilford CornerArora at 11:20 am on 01/09/2019 by text page via the [email or web address] messaging system.  Electronically Signed   By: Sebastian Ache M.D.   On: 01/09/2019 11:32   Mr Brain Wo Contrast  Result Date: 01/10/2019 CLINICAL DATA:  Stroke follow-up. EXAM: MRI HEAD WITHOUT CONTRAST TECHNIQUE: Multiplanar, multiecho pulse sequences of the  brain and surrounding structures were obtained without intravenous contrast. COMPARISON:  Head CT 01/09/2019 FINDINGS: The examination was discontinued prematurely due to patient pain. Only axial and coronal diffusion-weighted imaging was obtained. There is a intermediate sized area of acute ischemia within the posterior right MCA territory, within the right parietal lobe. The area of ischemia is roughly equal to that indicated on the perfusion scan. There is no contralateral ischemia or ischemia within a different vascular territory. There is no midline shift. IMPRESSION: 1. Truncated examination due to patient discomfort. 2. Acute ischemic infarct of the posterior right MCA territory, within the right parietal lobe. Electronically Signed   By: Deatra Robinson M.D.   On: 01/10/2019 14:28   Ct Cerebral Perfusion W Contrast  Result Date: 01/09/2019 CLINICAL DATA:  Left-sided neglect. EXAM: CT ANGIOGRAPHY HEAD AND NECK CT PERFUSION BRAIN TECHNIQUE: Multidetector CT imaging of the head and neck was performed using the standard protocol during bolus administration of intravenous contrast. Multiplanar CT image reconstructions and MIPs were obtained to evaluate the vascular anatomy. Carotid stenosis measurements (when applicable) are obtained utilizing NASCET criteria, using the distal internal carotid diameter as the denominator. Multiphase CT imaging of the brain was performed following IV bolus contrast injection. Subsequent parametric perfusion maps were calculated using RAPID software. CONTRAST:  ISOVUE-370 IOPAMIDOL (ISOVUE-370) INJECTION 76% COMPARISON:  None. FINDINGS: CTA NECK FINDINGS Aortic arch: Normal variant 4 vessel aortic arch with the left vertebral artery arising directly from the arch. Widely patent arch vessel origins. Right carotid system: Patent without evidence of stenosis or dissection. Mildly ectatic appearance of the mid to distal cervical ICA. Left carotid system: Patent without evidence  of stenosis or dissection. Mildly ectatic appearance of the mid to distal cervical ICA. Vertebral arteries: Patent without evidence of significant stenosis or dissection. Codominant. Skeleton: Mild-to-moderate cervical disc degeneration. Right C4-5 facet ankylosis. Other neck: Subcentimeter bilateral thyroid nodules. Complete right maxillary sinus opacification. Upper chest: Mild pleural-parenchymal scarring in the lung apices. Review of the MIP images confirms the above findings CTA HEAD FINDINGS Anterior circulation: The internal carotid arteries are widely patent from skull base to carotid termini. ACAs and MCAs are patent without evidence of significant proximal stenosis. There is a distal right M2 occlusion corresponding to the acute right parietal infarct. No aneurysm is identified. Posterior circulation: The intracranial vertebral arteries are widely patent to the basilar. Patent left PICA, right AICA, and bilateral SCAs are visualized. The basilar artery is widely patent. There are predominantly fetal type origins of both PCAs without evidence of significant PCA stenosis. No aneurysm is identified. Venous sinuses: Patent. Anatomic variants: Fetal type origins of both PCAs. Review of the MIP images confirms the above findings CT Brain Perfusion Findings: CBF (<30%) Volume: 0mL Perfusion (Tmax>6.0s) volume: 31mL Mismatch Volume: 31mL by automated RAPID processing, however the area of prolonged Tmax corresponds to the low-density established infarct on the earlier noncontrast head CT without evidence of significant penumbra. Infarction Location: Right parietal lobe IMPRESSION: 1. Distal right M2 occlusion. 2. Associated right parietal infarct without evidence of significant penumbra by CTP. 3. No significant stenosis in the proximal intracranial or cervical arterial circulation. These results were communicated to Dr. Wilford Corner at 11:20 am on 01/09/2019 by text page via the [facility] messaging  system. Electronically  Signed   By: Sebastian Ache M.D.   On: 01/09/2019 11:32        Scheduled Meds: . aspirin EC  81 mg Oral Daily  . clopidogrel  75 mg Oral Daily  . feeding supplement (ENSURE ENLIVE)  237 mL Oral BID BM  . ferrous sulfate  325 mg Oral BID WC  . heparin  5,000 Units Subcutaneous Q8H  . nadolol  40 mg Oral Daily  . pantoprazole  40 mg Oral Daily  . simvastatin  40 mg Oral q1800   Continuous Infusions: . sodium chloride 50 mL/hr at 01/10/19 1022     LOS: 2 days    Time spent: 35 minutes.     Alba Cory, MD [facility]  01/11/2019, 9:00 AM

## 2019-01-11 NOTE — Progress Notes (Signed)
  Echocardiogram 2D Echocardiogram has been performed.  Delcie Roch 01/11/2019, 3:27 PM

## 2019-01-11 NOTE — Progress Notes (Signed)
    [facility] has been requested to perform a transesophageal echocardiogram on Madison Odom for CVA.  After careful review of history and examination, the risks and benefits of transesophageal echocardiogram have been explained including risks of esophageal damage, perforation (1:10,000 risk), bleeding, pharyngeal hematoma as well as other potential complications associated with conscious sedation including aspiration, arrhythmia, respiratory failure and death. Alternatives to treatment were discussed, questions were answered. Patient is willing to proceed.  TEE - Dr. Royann Shivers 01/13/2019 @ 1430. NPO after midnight. Meds with sips.   Manson Passey, PA-C 01/11/2019 12:56 PM

## 2019-01-11 NOTE — Progress Notes (Addendum)
STROKE TEAM PROGRESS NOTE   INTERVAL HISTORY Her daughter is at the bedside.  Patient neuro stable. Has been up with therapy in the halls. CIR recommended. Stroke appears embolic. For tee and loop on Monday. She is pleasant, alert, oriented and doing well.  Vitals:   01/10/19 1529 01/10/19 2018 01/11/19 0002 01/11/19 0426  BP: (!) 122/56 113/64 (!) 122/43 134/60  Pulse: 66 79 65 64  Resp:  (!) 21 20 (!) 21  Temp: 98.6 F (37 C) 98.6 F (37 C) 98.2 F (36.8 C) 97.8 F (36.6 C)  TempSrc: Oral Oral Oral Oral  SpO2: 95% 95% 96% 94%    CBC:  Recent Labs  Lab 01/09/19 0844 01/10/19 0602  WBC 7.1 9.5  NEUTROABS 4.8 6.2  HGB 8.8* 8.5*  HCT 31.8* 29.7*  MCV 70.5* 68.4*  PLT 156 336    Basic Metabolic Panel:  Recent Labs  Lab 01/09/19 0844 01/10/19 0602  NA 138 139  K 3.7 3.6  CL 107 107  CO2 22 22  GLUCOSE 93 83  BUN 18 14  CREATININE 0.46 0.47  CALCIUM 8.4* 8.5*   Lipid Panel:     Component Value Date/Time   CHOL 158 01/10/2019 0602   TRIG 86 01/10/2019 0602   HDL 61 01/10/2019 0602   CHOLHDL 2.6 01/10/2019 0602   VLDL 17 01/10/2019 0602   LDLCALC 80 01/10/2019 0602   HgbA1c:  Lab Results  Component Value Date   HGBA1C 5.8 (H) 01/10/2019   Urine Drug Screen: No results found for: LABOPIA, COCAINSCRNUR, LABBENZ, AMPHETMU, THCU, LABBARB  Alcohol Level No results found for: [facility]  IMAGING Mr Brain Wo Contrast  Result Date: 01/10/2019 CLINICAL DATA:  Stroke follow-up. EXAM: MRI HEAD WITHOUT CONTRAST TECHNIQUE: Multiplanar, multiecho pulse sequences of the brain and surrounding structures were obtained without intravenous contrast. COMPARISON:  Head CT 01/09/2019 FINDINGS: The examination was discontinued prematurely due to patient pain. Only axial and coronal diffusion-weighted imaging was obtained. There is a intermediate sized area of acute ischemia within the posterior right MCA territory, within the right parietal lobe. The area of ischemia is roughly equal to  that indicated on the perfusion scan. There is no contralateral ischemia or ischemia within a different vascular territory. There is no midline shift. IMPRESSION: 1. Truncated examination due to patient discomfort. 2. Acute ischemic infarct of the posterior right MCA territory, within the right parietal lobe. Electronically Signed   By: Deatra Robinson M.D.   On: 01/10/2019 14:28   Physical and Neurologic exam stable today:  PHYSICAL EXAM Constitutional: elderly thin female who appears well-developed and well-nourished.  Psych: Affect appropriate to situation Eyes: No scleral injection HENT: No OP obstrucion Head: Normocephalic.  Cardiovascular: Normal rate and regular rhythm.  Respiratory: Effort normal, non-labored breathing Skin: WDI  Neuro: Mental Status: Patient is awake, alert, oriented to person, place, month, year, and situation. Patient is able to give a clear and coherent history. Neglect of left side Cranial Nerves: II: Visual Fields are full. III,IV, VI: EOMI without ptosis or diploplia.  Pupils are equal, round, and reactive to light.   V: Facial sensation is symmetric to temperature however to DSS neglected left side VII: Facial movement is symmetric.  VIII: hearing is intact to voice X: Uvula elevates symmetrically XI: Shoulder shrug is symmetric. XII: tongue is midline without atrophy or fasciculations.  Motor: Tone is normal. Bulk is normal. 5/5 strength was present in all four extremities. R hand orbits L Sensory: Sensation is symmetric to  light touch and temperature in the arms and legs. DSS neglect left side Plantars: Toes are downgoing bilaterally.  Cerebellar: Unsteady FNF on the L. intact on the R   ASSESSMENT/PLAN Ms. Madison Odom is a 85 y.o. female with history of scoliosis, pelvis fracture, mitral valve prolapse, hyperlipidemia, and GERD who woke with imbalance. Brought to ED and found to have R sided neglect. Not a tPA candidate as out of the window.  No LVO found on imaging.  Stroke:  right parietal infarct embolic secondary to unknown source, suspicious for AF  CT head Small vessel disease. Atrophy. Acute R parietal lobe infarct. R maxillary and L sphenoid sinus dz  CTA head & neck distal R M2 occlusion.   CT perfusion R parietal infarct without penumbra  MRI  R parietal infarct. Formal reading pending   2D Echo  pending   TEE to look for embolic source. Arranged with [facility] for Monday.  If positive for PFO (patent foramen ovale), check bilateral lower extremity venous dopplers to rule out DVT as possible source of stroke. (I have made patient NPO after midnight tonight).  If TEE negative, a  [facility] electrophysiologist will consult and consider placement of an implantable loop recorder to evaluate for atrial fibrillation as etiology of stroke. This has been explained to patient/family by myself and they are agreeable.  EKG repeated this am d/t hx AF on prior EKGs. Machine auto read AF but MD who came behind removed dx and stated pt in sinus  LDL 80  HgbA1c 5.8  Heparin 5000 units sq tid for VTE prophylaxis  aspirin 81 mg daily prior to admission, now on aspirin 325 mg daily. Continue for now. Likely DAPT at d/c. Await TEE results  Therapy recommendations:  CIR. Consult requested  Disposition:  pending   Hyperlipidemia  Home meds:  zocor 20  Now on lipitor 80  LDL 80, goal < 70  Will decrease zocor dose to 40  Continue statin at discharge  Other Stroke Risk Factors  Advanced age  Other Active Problems  MVP - followed by Dr. Tobias Alexander  GERD  Scoliosis  Anemia - Hgb 8.5 w/ low Fe and Fer. For iron replacement  Plan: - TEE and Loop Monday - aspirin 81 and Plavix 75 DAPT for 3 weeks and then Plavix alone.  Continue Zocor 40 for HLD and stroke prevention.   - PT/OT recommend CIR. - She is doing extremely well. At this time stroke team will sign  off but we will follow results on Monday of TEE and loop for anything further needed - f/u with Jessica stroke nurse at [facility] in 6-12 weeks.  Hospital day # 2  Personally  participated in, made any corrections needed, and agree with history, physical, neuro exam,assessment and plan as stated above.     Naomie Dean, MD [facility]      I have personally reviewed the history, evaluated lab date, reviewed imaging studies and agree with radiology interpretations.    Naomie Dean, MD Stroke Neurology   A total of 15 minutes was spent face-to-face with this patient. Over half this time was spent on counseling patient and daughter on the  1. Cerebrovascular accident (CVA), unspecified mechanism (HCC)   2. Left-sided neglect     diagnosis and different diagnostic and therapeutic options available.        To contact Stroke Continuity provider, please refer to [email or web address]. After hours, contact General Neurology

## 2019-01-11 NOTE — Progress Notes (Signed)
Occupational Therapy Treatment Patient Details Name: Madison Odom MRN: [id number] DOB: 1934-07-05 Today's Date: 01/11/2019    History of present illness Patient is a 85 y/o female presenting to the ED on 01/09/19 with primary complaints of imbalance. CT of head was obtained.  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 PMH significant for scoliosis, pelvis fracture, mitral valve prolapse, hyperlipidemia, and GERD.   OT comments  Pt. Seen for skilled OT.  Able to complete bed mobility and pivot transfer to recliner.  Family present at end of session.  Answered questions regarding possible admit to CIR for continued therapies.  Agree with recommendation that pt. Is an excellent candidate for CIR for continued OT with strong focus on L inattention and visual deficits as noted in previous sessions.     Follow Up Recommendations  CIR;Supervision/Assistance - 24 hour    Equipment Recommendations       Recommendations for Other Services      Precautions / Restrictions Precautions Precautions: Fall       Mobility Bed Mobility Overal bed mobility: Needs Assistance Bed Mobility: Supine to Sit     Supine to sit: Supervision        Transfers Overall transfer level: Needs assistance Equipment used: None Transfers: Stand Pivot Transfers;Squat Pivot Transfers   Stand pivot transfers: Min guard Squat pivot transfers: Min guard          Balance                                           ADL either performed or assessed with clinical judgement   ADL Overall ADL's : Needs assistance/impaired                         Toilet Transfer: Tax adviser Details (indicate cue type and reason): simulated with pivot from eob to recliner, good hand placement no cues required for sequencing Toileting- Clothing Manipulation and Hygiene: Minimal assistance;Sit to/from stand Toileting - Clothing Manipulation Details (indicate  cue type and reason): simulated during transfer             Vision       Perception     Praxis      Cognition Arousal/Alertness: Awake/alert Behavior During Therapy: WFL for tasks assessed/performed Overall Cognitive Status: Within Functional Limits for tasks assessed                                          Exercises     Shoulder Instructions       General Comments      Pertinent Vitals/ Pain       Pain Assessment: No/denies pain  Home Living                                          Prior Functioning/Environment              Frequency  Min 3X/week        Progress Toward Goals  OT Goals(current goals can now be found in the care plan section)  Progress towards OT goals: Progressing toward goals     Plan  Co-evaluation                 AM-PAC OT "6 Clicks" Daily Activity     Outcome Measure   Help from another person eating meals?: A Little Help from another person taking care of personal grooming?: A Little Help from another person toileting, which includes using toliet, bedpan, or urinal?: A Little Help from another person bathing (including washing, rinsing, drying)?: A Little Help from another person to put on and taking off regular upper body clothing?: A Little Help from another person to put on and taking off regular lower body clothing?: A Little 6 Click Score: 18    End of Session    OT Visit Diagnosis: Unsteadiness on feet (R26.81);Other abnormalities of gait and mobility (R26.89);Muscle weakness (generalized) (M62.81);Ataxia, unspecified (R27.0)   Activity Tolerance Patient tolerated treatment well   Patient Left in chair;with call bell/phone within reach;with family/visitor present   Nurse Communication          Time: 0623-7628 OT Time Calculation (min): 13 min  Charges: OT General Charges $OT Visit: 1 Visit OT Treatments $Self Care/Home Management : 8-22  mins   Robet Leu, COTA/L 01/11/2019, 12:59 PM

## 2019-01-12 LAB — BASIC METABOLIC PANEL
ANION GAP: 8 (ref 5–15)
BUN: 13 mg/dL (ref 8–23)
CO2: 22 mmol/L (ref 22–32)
Calcium: 8.3 mg/dL — ABNORMAL LOW (ref 8.9–10.3)
Chloride: 108 mmol/L (ref 98–111)
Creatinine, Ser: 0.52 mg/dL (ref 0.44–1.00)
GFR calc Af Amer: >60 mL/min (ref >60)
GFR calc non Af Amer: >60 mL/min (ref >60)
Glucose, Bld: 86 mg/dL (ref 70–99)
POTASSIUM: 3.5 mmol/L (ref 3.5–5.1)
Sodium: 138 mmol/L (ref 135–145)

## 2019-01-12 LAB — CBC
HCT: 31.1 % — ABNORMAL LOW (ref 36.0–46.0)
HEMOGLOBIN: 8.6 g/dL — AB (ref 12.0–15.0)
MCH: 19.5 pg — ABNORMAL LOW (ref 26.0–34.0)
MCHC: 27.7 g/dL — ABNORMAL LOW (ref 30.0–36.0)
MCV: 70.5 fL — ABNORMAL LOW (ref 80.0–100.0)
NRBC: 0 % (ref 0.0–0.2)
Platelets: 332 10*3/uL (ref 150–400)
RBC: 4.41 MIL/uL (ref 3.87–5.11)
RDW: 20.1 % — ABNORMAL HIGH (ref 11.5–15.5)
WBC: 8 10*3/uL (ref 4.0–10.5)

## 2019-01-12 MED ORDER — MELATONIN 3 MG PO TABS
6.0000 mg | ORAL_TABLET | Freq: Every evening | ORAL | Status: DC | PRN
  Administered 2019-01-12: 6 mg via ORAL
  Filled 2019-01-12 (×2): qty 2

## 2019-01-12 NOTE — Progress Notes (Signed)
PROGRESS NOTE    Madison Odom  BXI:[id number] DOB: [birth year] DOA: 01/09/2019 PCP: [name], NP    Brief Narrative: 85 year old with past medical history significant for GERD, HLD, scoliosis who presents with difficulty walking.  She reports that she actually has some difficulty walking at baseline but this morning she was having more difficulty with ambulation.  In the ED she was noted to have right side neglect.   Assessment & Plan:   Active Problems:   Acute CVA (cerebrovascular accident) (HCC)  Acute right parietal CVA; Acute/subacute infarct of the RIGHT parietal lobe MRI ; Acute ischemic infarct of the posterior right MCA territory, within the right parietal lobe. Echo normal ef.  LDL 80, globin A1c 5.8 On aspirin and plavix Plan for TEE on Monday. Will need loop recorder.  Monitor on telemetry.  HTN; continue with nadolol  GERD continue with PPI  Iron deficiency anemia: Started  iron supplement. Iron 12, ferritin 7. Hb stable.   Insomnia; increase melatonin.    Estimated body mass index is 16.68 kg/m as calculated from the following:   Height as of 01/31/18: 4\' 10"  (1.473 m).   Weight as of 01/31/18: 36.2 kg.   DVT prophylaxis: heparin Code Status: full code Family Communication: daughter at bedside.  Disposition Plan: Needs TEE and CIR evaluation    Consultants:  Neurology   Procedures:   ECHO;  Doppler.   Antimicrobials:   none   Subjective: No new complaint, other than she could sleep last night.   Objective: Vitals:   01/11/19 2338 01/12/19 0317 01/12/19 0753 01/12/19 1148  BP: 122/64 (!) 133/52 133/62 (!) 143/66  Pulse: 69 (!) 58 (!) 58 60  Resp: 16 18 18  (!) 26  Temp: 98 F (36.7 C) 98.1 F (36.7 C) 97.8 F (36.6 C) 97.6 F (36.4 C)  TempSrc: Oral Oral Oral Oral  SpO2: 96% 95% 96% 94%   No intake or output data in the 24 hours ending 01/12/19 1222 There were no vitals filed for this visit.  Examination:  General  exam: NAD Respiratory system: CTA Cardiovascular system: S 1, S 2 RRR Gastrointestinal system: BS present, soft, nt Central nervous system: alert and oriented Extremities: symmetric power    Data Reviewed: I have personally reviewed following labs and imaging studies  CBC: Recent Labs  Lab 01/09/19 0844 01/10/19 0602 01/12/19 0536  WBC 7.1 9.5 8.0  NEUTROABS 4.8 6.2  --   HGB 8.8* 8.5* 8.6*  HCT 31.8* 29.7* 31.1*  MCV 70.5* 68.4* 70.5*  PLT 156 336 332   Basic Metabolic Panel: Recent Labs  Lab 01/09/19 0844 01/10/19 0602 01/12/19 0536  NA 138 139 138  K 3.7 3.6 3.5  CL 107 107 108  CO2 22 22 22   GLUCOSE 93 83 86  BUN 18 14 13   CREATININE 0.46 0.47 0.52  CALCIUM 8.4* 8.5* 8.3*   GFR: CrCl cannot be calculated (Unknown ideal weight.). Liver Function Tests: Recent Labs  Lab 01/10/19 0602  AST 25  ALT 15  ALKPHOS 51  BILITOT 0.5  PROT 5.8*  ALBUMIN 3.1*   No results for input(s): LIPASE, AMYLASE in the last 168 hours. No results for input(s): AMMONIA in the last 168 hours. Coagulation Profile: No results for input(s): INR, PROTIME in the last 168 hours. Cardiac Enzymes: No results for input(s): CKTOTAL, CKMB, CKMBINDEX, TROPONINI in the last 168 hours. BNP (last 3 results) No results for input(s): PROBNP in the last 8760 hours. HbA1C: Recent Labs  01/10/19 0602  HGBA1C 5.8*   CBG: Recent Labs  Lab 01/10/19 0733 01/11/19 0003 01/11/19 0116 01/11/19 2313  GLUCAP 80 56* 148* 98   Lipid Profile: Recent Labs    01/10/19 0602  CHOL 158  HDL 61  LDLCALC 80  TRIG 86  CHOLHDL 2.6   Thyroid Function Tests: No results for input(s): TSH, T4TOTAL, FREET4, T3FREE, THYROIDAB in the last 72 hours. Anemia Panel: Recent Labs    01/10/19 0602  VITAMINB12 417  FERRITIN 7*  TIBC 441  IRON 12*   Sepsis Labs: No results for input(s): PROCALCITON, LATICACIDVEN in the last 168 hours.  No results found for this or any previous visit (from the  past 240 hour(s)).       Radiology Studies: Mr Brain Wo Contrast  Result Date: 01/10/2019 CLINICAL DATA:  Stroke follow-up. EXAM: MRI HEAD WITHOUT CONTRAST TECHNIQUE: Multiplanar, multiecho pulse sequences of the brain and surrounding structures were obtained without intravenous contrast. COMPARISON:  Head CT 01/09/2019 FINDINGS: The examination was discontinued prematurely due to patient pain. Only axial and coronal diffusion-weighted imaging was obtained. There is a intermediate sized area of acute ischemia within the posterior right MCA territory, within the right parietal lobe. The area of ischemia is roughly equal to that indicated on the perfusion scan. There is no contralateral ischemia or ischemia within a different vascular territory. There is no midline shift. IMPRESSION: 1. Truncated examination due to patient discomfort. 2. Acute ischemic infarct of the posterior right MCA territory, within the right parietal lobe. Electronically Signed   By: Deatra Robinson M.D.   On: 01/10/2019 14:28        Scheduled Meds: . aspirin EC  81 mg Oral Daily  . clopidogrel  75 mg Oral Daily  . feeding supplement (ENSURE ENLIVE)  237 mL Oral BID BM  . ferrous sulfate  325 mg Oral BID WC  . heparin  5,000 Units Subcutaneous Q8H  . nadolol  40 mg Oral Daily  . pantoprazole  40 mg Oral Daily  . polyethylene glycol  17 g Oral Daily  . simvastatin  40 mg Oral q1800   Continuous Infusions: . sodium chloride 50 mL/hr at 01/10/19 1022     LOS: 3 days    Time spent: 35 minutes.     Alba Cory, MD [facility]  01/12/2019, 12:22 PM

## 2019-01-13 ENCOUNTER — Encounter: Payer: Medicare Other | Admitting: Physical Therapy

## 2019-01-13 ENCOUNTER — Encounter (HOSPITAL_COMMUNITY)
Admission: EM | Disposition: A | Discharge status: Rehabilitation Facility | Payer: Self-pay | Source: Home / Self Care | Attending: Internal Medicine

## 2019-01-13 ENCOUNTER — Encounter (HOSPITAL_COMMUNITY): Payer: Self-pay | Admitting: Physical Medicine and Rehabilitation

## 2019-01-13 ENCOUNTER — Inpatient Hospital Stay (HOSPITAL_COMMUNITY): Payer: Medicare Other

## 2019-01-13 DIAGNOSIS — I340 Nonrheumatic mitral (valve) insufficiency: Secondary | ICD-10-CM

## 2019-01-13 DIAGNOSIS — I6389 Other cerebral infarction: Secondary | ICD-10-CM

## 2019-01-13 DIAGNOSIS — I351 Nonrheumatic aortic (valve) insufficiency: Secondary | ICD-10-CM

## 2019-01-13 HISTORY — PX: LOOP RECORDER INSERTION: EP1214

## 2019-01-13 HISTORY — PX: TEE WITHOUT CARDIOVERSION: SHX5443

## 2019-01-13 SURGERY — LOOP RECORDER INSERTION
Site: Chest | Laterality: Left

## 2019-01-13 SURGERY — ECHOCARDIOGRAM, TRANSESOPHAGEAL
Anesthesia: Moderate Sedation

## 2019-01-13 MED ORDER — FENTANYL CITRATE (PF) 100 MCG/2ML IJ SOLN
INTRAMUSCULAR | Status: AC
  Filled 2019-01-13: qty 2

## 2019-01-13 MED ORDER — MIDAZOLAM HCL (PF) 10 MG/2ML IJ SOLN
INTRAMUSCULAR | Status: DC | PRN
  Administered 2019-01-13 (×2): 1 mg via INTRAVENOUS

## 2019-01-13 MED ORDER — SODIUM CHLORIDE 0.9 % IV SOLN: INTRAVENOUS | Status: DC

## 2019-01-13 MED ORDER — LIDOCAINE-EPINEPHRINE 1 %-1:100000 IJ SOLN
INTRAMUSCULAR | Status: DC | PRN
  Administered 2019-01-13: 10 mL

## 2019-01-13 MED ORDER — MIDAZOLAM HCL (PF) 5 MG/ML IJ SOLN
INTRAMUSCULAR | Status: AC
  Filled 2019-01-13: qty 2

## 2019-01-13 MED ORDER — BUTAMBEN-TETRACAINE-BENZOCAINE 2-2-14 % EX AERO
INHALATION_SPRAY | CUTANEOUS | Status: DC | PRN
  Administered 2019-01-13: 2 via TOPICAL

## 2019-01-13 MED ORDER — LIDOCAINE-EPINEPHRINE 1 %-1:100000 IJ SOLN
INTRAMUSCULAR | Status: AC
  Filled 2019-01-13: qty 1

## 2019-01-13 SURGICAL SUPPLY — 2 items
LOOP REVEAL LINQSYS (Prosthesis & Implant Heart) ×2 IMPLANT
PACK LOOP INSERTION (CUSTOM PROCEDURE TRAY) ×3 IMPLANT

## 2019-01-13 NOTE — Progress Notes (Signed)
  Echocardiogram Echocardiogram Transesophageal has been performed.  Madison Odom 01/13/2019, 3:34 PM

## 2019-01-13 NOTE — Consult Note (Addendum)
ELECTROPHYSIOLOGY CONSULT NOTE  Patient ID: Madison Odom MRN: [id number], DOB/AGE: 83/09/1934   Admit date: 01/09/2019 Date of Consult: 01/13/2019  Primary Physician: [name], NP Primary Cardiologist: Dr. Delton See Reason for Consultation: Cryptogenic stroke ; recommendations regarding Implantable Loop Recorder, requested by Dr. Lucia Gaskins  History of Present Illness Madison Odom was admitted on 01/09/2019 with stroke.   PMHx of GERD, HLD, MVP, OA, h/o falls.   They first developed symptoms upon waking with balance instability.  Neurology notes,right parietal infarct embolic secondary to unknown source, suspicious for AF.  she has undergone workup for stroke including echocardiogram and carotid angio.  The patient has been monitored on telemetry which has demonstrated sinus rhythm with no arrhythmias.  Inpatient stroke work-up is to be completed with a TEE.   Echocardiogram this admission demonstrated  IMPRESSIONS 1. The left ventricle has normal systolic function with an ejection fraction of 60-65%. The cavity size was normal. Moderate basal septal hypertrophy. Left ventricular diastolic parameters were normal.  2. The right ventricle has normal systolic function. The cavity was normal. There is no increase in right ventricular wall thickness.  3. The mitral valve is normal in structure.  4. The tricuspid valve is normal in structure.  5. The aortic valve is tricuspid Moderate thickening of the aortic valve Moderate calcification of the aortic valve. Aortic valve regurgitation is mild by color flow Doppler.  6. The pulmonic valve was normal in structure.    Lab work is reviewed.  Prior to admission, the patient denies chest pain, shortness of breath, dizziness, or syncope.   She reports going to PT to help with her ambulation and mobility with much improvement in the last several months.  She feels like her post CVA balance problems are improving.  She DOES have palpitations.  About  once a month or every couple months she will wake about 4AM with the sensation that her heart is beating fast.  She will sit up at the edge of the bed an over a few minutes settles down. She is recovering from her stroke with plans to CIR at discharge.   Past Medical History:  Diagnosis Date  . Anemia   . GERD (gastroesophageal reflux disease)   . Hyperlipidemia   . Hypertensive retinopathy   . MVP (mitral valve prolapse)   . OP (osteoporosis)   . Pelvis fracture (HCC) 09/2015   CLOSED   . Scoliosis   . Spastic colon      Surgical History:  Past Surgical History:  Procedure Laterality Date  . ABDOMINAL HYSTERECTOMY  1965  . APPENDECTOMY    . BILATERAL OOPHORECTOMY  1981  . EYE SURGERY    . ROTATOR CUFF REPAIR Right      Medications Prior to Admission  Medication Sig Dispense Refill Last Dose  . aspirin 81 MG EC tablet Take 81 mg by mouth daily.     Past Week at Unknown time  . Calcium Carb-Cholecalciferol (CALCIUM 500 +D PO) Take 1 tablet by mouth daily.    Past Week at Unknown time  . ibuprofen (ADVIL,MOTRIN) 200 MG tablet Take 200-400 mg by mouth every 6 (six) hours as needed for headache or mild pain.   Past Month at Unknown time  . ketotifen (ZADITOR) 0.025 % ophthalmic solution Place 1 drop into both eyes 2 (two) times daily as needed (itching).   Past Week at Unknown time  . Multiple Vitamin (MULTIVITAMIN) tablet Take 1 tablet by mouth daily.   Past Week  at Unknown time  . nadolol (CORGARD) 40 MG tablet TAKE 1 TABLET BY MOUTH EVERY DAY (Patient taking differently: Take 40 mg by mouth daily. ) 90 tablet 3 01/08/2019 at 0800  . omeprazole (PRILOSEC) 20 MG capsule TAKE 1 CAPSULE BY MOUTH EVERY DAY (Patient taking differently: Take 20 mg by mouth daily. ) 90 capsule 2 Past Week at Unknown time  . simvastatin (ZOCOR) 20 MG tablet TAKE 1 TABLET BY MOUTH EVERY DAY (Patient taking differently: Take 20 mg by mouth daily at 6 PM. ) 90 tablet 2 Past Week at Unknown time    Inpatient  Medications:  . aspirin EC  81 mg Oral Daily  . clopidogrel  75 mg Oral Daily  . feeding supplement (ENSURE ENLIVE)  237 mL Oral BID BM  . ferrous sulfate  325 mg Oral BID WC  . heparin  5,000 Units Subcutaneous Q8H  . nadolol  40 mg Oral Daily  . pantoprazole  40 mg Oral Daily  . simvastatin  40 mg Oral q1800    Allergies:  Allergies  Allergen Reactions  . Phenothiazines Other (See Comments) and Anaphylaxis    Unknown reaction  . Prochlorperazine Anaphylaxis  . Benadryl [Diphenhydramine]     Agitation , confusion   . Penicillins     Yeast infections Did it involve swelling of the face/tongue/throat, SOB, or low BP? No Did it involve sudden or severe rash/hives, skin peeling, or any reaction on the inside of your mouth or nose? No Did you need to seek medical attention at a hospital or doctor's office? No When did it last happen?30 yrs If all above answers are "NO", may proceed with cephalosporin use.   . Tetracycline     Other reaction(s): Other (See Comments) Yeast infection    Social History   Socioeconomic History  . Marital status: Widowed    Spouse name: Not on file  . Number of children: Not on file  . Years of education: Not on file  . Highest education level: Not on file  Occupational History  . Not on file  Social Needs  . Financial resource strain: Not on file  . Food insecurity:    Worry: Not on file    Inability: Not on file  . Transportation needs:    Medical: Not on file    Non-medical: Not on file  Tobacco Use  . Smoking status: Never Smoker  . Smokeless tobacco: Never Used  Substance and Sexual Activity  . Alcohol use: No  . Drug use: No  . Sexual activity: Not on file  Lifestyle  . Physical activity:    Days per week: Not on file    Minutes per session: Not on file  . Stress: Not on file  Relationships  . Social connections:    Talks on phone: Not on file    Gets together: Not on file    Attends religious service: Not on file      Active member of club or organization: Not on file    Attends meetings of clubs or organizations: Not on file    Relationship status: Not on file  . Intimate partner violence:    Fear of current or ex partner: Not on file    Emotionally abused: Not on file    Physically abused: Not on file    Forced sexual activity: Not on file  Other Topics Concern  . Not on file  Social History Narrative   1 year of college  Widow   Two daughters      Family History  Problem Relation Age of Onset  . Heart attack Mother   . Hypertension Mother   . Heart disease Father   . Hypertension Father   . Stroke Neg Hx       Review of Systems: All other systems reviewed and are otherwise negative except as noted above.  Physical Exam: Vitals:   01/12/19 2036 01/12/19 2327 01/13/19 0420 01/13/19 0850  BP: 124/63 132/64 140/66 (!) 146/73  Pulse: 62 (!) 59 62 67  Resp: (!) 21  Temp: 98.1 F (36.7 C) 98 F (36.7 C) 98.4 F (36.9 C) 97.7 F (36.5 C)  TempSrc: Oral Oral Oral Oral  SpO2: 96% 95% 94% 97%    GEN- The patient is well appearing, alert and oriented x 3 today.   Head- normocephalic, atraumatic Eyes-  Sclera clear, conjunctiva pink Ears- hearing intact Oropharynx- clear Neck- supple Lungs-  CTA b/l, normal work of breathing Heart- RRR, no murmurs, rubs or gallops  GI- soft, NT, ND Extremities- no clubbing, cyanosis, or edema MS- she has significant scoliosis, thin body habitus, age appropriate atrophy. Skin- no rash or lesion Psych- euthymic mood, full affect   Labs:   Lab Results  Component Value Date   WBC 8.0 01/12/2019   HGB 8.6 (L) 01/12/2019   HCT 31.1 (L) 01/12/2019   MCV 70.5 (L) 01/12/2019   PLT 332 01/12/2019    Recent Labs  Lab 01/10/19 0602 01/12/19 0536  NA 139 138  K 3.6 3.5  CL 107 108  CO2 22 22  BUN 14 13  CREATININE 0.47 0.52  CALCIUM 8.5* 8.3*  PROT 5.8*  --   BILITOT 0.5  --   ALKPHOS 51  --   ALT 15  --   AST 25  --    GLUCOSE 83 86   Lab Results  Component Value Date   TROPONINI 0.07 (H) 09/22/2015   Lab Results  Component Value Date   CHOL 158 01/10/2019   CHOL 178 11/29/2017   CHOL 178 11/16/2016   Lab Results  Component Value Date   HDL 61 01/10/2019   HDL 69.80 11/29/2017   HDL 69.20 11/16/2016   Lab Results  Component Value Date   LDLCALC 80 01/10/2019   LDLCALC 90 11/29/2017   LDLCALC 89 11/16/2016   Lab Results  Component Value Date   TRIG 86 01/10/2019   TRIG 90.0 11/29/2017   TRIG 96.0 11/16/2016   Lab Results  Component Value Date   CHOLHDL 2.6 01/10/2019   CHOLHDL 3 11/29/2017   CHOLHDL 3 11/16/2016   No results found for: LDLDIRECT  No results found for: DDIMER   Radiology/Studies:   Ct Angio Head W Or Wo Contrast Result Date: 01/09/2019 CLINICAL DATA:  Left-sided neglect. EXAM: CT ANGIOGRAPHY HEAD AND NECK CT PERFUSION BRAIN TECHNIQUE: Multidetector CT imaging of the head and neck was performed using the standard protocol during bolus administration of intravenous contrast. Multiplanar CT image reconstructions and MIPs were obtained to evaluate the vascular anatomy. Carotid stenosis measurements (when applicable) are obtained utilizing NASCET criteria, using the distal internal carotid diameter as the denominator. Multiphase CT imaging of the brain was performed following IV bolus contrast injection. Subsequent parametric perfusion maps were calculated using RAPID software. CONTRAST:  ISOVUE-370 IOPAMIDOL (ISOVUE-370) INJECTION 76% COMPARISON:  None. FINDINGS: CTA NECK FINDINGS Aortic arch: Normal variant 4 vessel aortic arch with the left vertebral artery arising directly  from the arch. Widely patent arch vessel origins. Right carotid system: Patent without evidence of stenosis or dissection. Mildly ectatic appearance of the mid to distal cervical ICA. Left carotid system: Patent without evidence of stenosis or dissection. Mildly ectatic appearance of the mid to  distal cervical ICA. Vertebral arteries: Patent without evidence of significant stenosis or dissection. Codominant. Skeleton: Mild-to-moderate cervical disc degeneration. Right C4-5 facet ankylosis. Other neck: Subcentimeter bilateral thyroid nodules. Complete right maxillary sinus opacification. Upper chest: Mild pleural-parenchymal scarring in the lung apices. Review of the MIP images confirms the above findings CTA HEAD FINDINGS Anterior circulation: The internal carotid arteries are widely patent from skull base to carotid termini. ACAs and MCAs are patent without evidence of significant proximal stenosis. There is a distal right M2 occlusion corresponding to the acute right parietal infarct. No aneurysm is identified. Posterior circulation: The intracranial vertebral arteries are widely patent to the basilar. Patent left PICA, right AICA, and bilateral SCAs are visualized. The basilar artery is widely patent. There are predominantly fetal type origins of both PCAs without evidence of significant PCA stenosis. No aneurysm is identified. Venous sinuses: Patent. Anatomic variants: Fetal type origins of both PCAs. Review of the MIP images confirms the above findings CT Brain Perfusion Findings: CBF (<30%) Volume: 0mL Perfusion (Tmax>6.0s) volume: 31mL Mismatch Volume: 31mL by automated RAPID processing, however the area of prolonged Tmax corresponds to the low-density established infarct on the earlier noncontrast head CT without evidence of significant penumbra. Infarction Location: Right parietal lobe IMPRESSION: 1. Distal right M2 occlusion. 2. Associated right parietal infarct without evidence of significant penumbra by CTP. 3. No significant stenosis in the proximal intracranial or cervical arterial circulation. These results were communicated to Dr. Wilford Corner at 11:20 am on 01/09/2019 by text page via the [facility] messaging system. Electronically Signed   By: Sebastian Ache M.D.   On: 01/09/2019 11:32    Ct Head Wo  Contrast Result Date: 01/09/2019 CLINICAL DATA:  Focal neural deficit of greater than 6 hours suspected stroke, LEFT hemi spatial neglect on exam, imbalance, history of hypertension EXAM: CT HEAD WITHOUT CONTRAST TECHNIQUE: Contiguous axial images were obtained from the base of the skull through the vertex without intravenous contrast. Sagittal and coronal MPR images reconstructed from axial data set. COMPARISON:  None FINDINGS: Brain: Generalized atrophy. Normal ventricular morphology. No midline shift or mass effect. Small vessel chronic ischemic changes of deep cerebral white matter. Loss of gray-white differentiation at the RIGHT parietal lobe consistent with cortical infarct. No intracranial hemorrhage, mass lesion or extra-axial fluid collection. No additional infarcts identified. Vascular: No definite hyperdense vessels Skull: Demineralized with biparietal thinning.  No fractures. Sinuses/Orbits: Complete opacification of RIGHT maxillary sinus and LEFT sphenoid sinus. Remaining visualized paranasal sinuses and mastoid air cells clear Other: N/A IMPRESSION: Atrophy with small vessel chronic ischemic changes of deep cerebral white matter. Acute/subacute infarct of the RIGHT parietal lobe where loss of gray-white differentiation is seen. RIGHT maxillary and LEFT sphenoid sinus disease changes. Electronically Signed   By: Ulyses Southward M.D.   On: 01/09/2019 09:57     Mr Brain Wo Contrast Result Date: 01/10/2019 CLINICAL DATA:  Stroke follow-up. EXAM: MRI HEAD WITHOUT CONTRAST TECHNIQUE: Multiplanar, multiecho pulse sequences of the brain and surrounding structures were obtained without intravenous contrast. COMPARISON:  Head CT 01/09/2019 FINDINGS: The examination was discontinued prematurely due to patient pain. Only axial and coronal diffusion-weighted imaging was obtained. There is a intermediate sized area of acute ischemia within the posterior right MCA  territory, within the right parietal lobe. The area  of ischemia is roughly equal to that indicated on the perfusion scan. There is no contralateral ischemia or ischemia within a different vascular territory. There is no midline shift. IMPRESSION: 1. Truncated examination due to patient discomfort. 2. Acute ischemic infarct of the posterior right MCA territory, within the right parietal lobe. Electronically Signed   By: Deatra Robinson M.D.   On: 01/10/2019 14:28      12-lead ECG SR All prior EKG's in EPIC reviewed with no documented atrial fibrillation  Telemetry SR  Assessment and Plan:  1. Cryptogenic stroke The patient presents with cryptogenic stroke.  The patient has a TEE planned for this afternoon.  I spoke at length with the patient about monitoring for afib with either a 30 day event monitor or an implantable loop recorder.  Risks, benefits, and alteratives to implantable loop recorder were discussed with the patient today.   She does have some palpitations but can have months in-between episodes.  At this time, the patient is very clear in her decision to proceed with implantable loop recorder.   Wound care was reviewed with the patient (keep incision clean and dry for 3 days).  Wound check will be scheduled for the patient  Please call with questions.   Renee Norberto Sorenson, PA-C 01/13/2019  EP Attending  Patient seen and examined. Agree with above. The patient has had a cryptogenic stroke. She has had a nice recovery. She is pendiung TEE. I have reviewed the treatment options with the patient and her daughters. The risks/benefits/goals/expectations of insertion of an ILR were reviewed and she wishes to proceed.  Leonia Reeves.D.

## 2019-01-13 NOTE — Interval H&P Note (Signed)
History and Physical Interval Note:  01/13/2019 4:12 PM  Madison Odom  has presented today for surgery, with the diagnosis of syncope  The various methods of treatment have been discussed with the patient and family. After consideration of risks, benefits and other options for treatment, the patient has consented to  Procedure(s): LOOP RECORDER INSERTION (Left) as a surgical intervention .  The patient's history has been reviewed, patient examined, no change in status, stable for surgery.  I have reviewed the patient's chart and labs.  Questions were answered to the patient's satisfaction.     Lewayne Bunting

## 2019-01-13 NOTE — H&P (Signed)
Physical Medicine and Rehabilitation Admission H&P    Chief Complaint  Patient presents with  . Stroke with functional decline due to balance deficits and left inattention.    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  CTA head/neck done revealing right M2 occlusion and right parietal infarct without significant penumbra.  MRI brain done revealing acute ischemic infarct in posterior right MCA territory affecting right parietal lobe.  2D echo showed EF of 60 to 65% with moderate basal septal hypertrophy, normal mitral valve trivial regurg and moderate thickening of aortic valve with mild regurg.    She was found to have iron deficiency anemia with hemoglobin of 8.8 and started on iron supplement.  EKG with question of A fib and stroke felt to be embolic due to unknown source. She was started on DAPT with recommendations for to discontinue ASA after 3 weeks.   TEE negative for PFO or thrombus and Loop recorder placed on 3/2. She has had some bleeding from site that has been controlled with pressure. Patient with resultant left neglect with poor safety awareness. CIR recommended due to functional decline.    Review of Systems  Constitutional: Negative for fever.  HENT: Negative for hearing loss (mild) and tinnitus.   Eyes: Negative for double vision.  Respiratory: Negative for cough and wheezing.   Cardiovascular: Positive for leg swelling. Negative for chest pain and palpitations.  Gastrointestinal: Negative for heartburn.  Genitourinary: Negative for dysuria.  Musculoskeletal: Positive for back pain. Negative for falls and myalgias.       Has been clumsy--tends to run into things PTA.   Skin: Negative for rash.  Neurological:  Negative for dizziness, tingling, tremors and headaches.  Psychiatric/Behavioral: Negative for suicidal ideas. The patient has insomnia (due to hospitalization. ).      Past Medical History:  Diagnosis Date  . Anemia   . GERD (gastroesophageal reflux disease)   . Hyperlipidemia   . Hypertension   . Hypertensive retinopathy   . MVP (mitral valve prolapse)   . OP (osteoporosis)   . Pelvis fracture (HCC) 09/2015   CLOSED   . Scoliosis   . Spastic colon     Past Surgical History:  Procedure Laterality Date  . ABDOMINAL HYSTERECTOMY  1965  . APPENDECTOMY    . BILATERAL OOPHORECTOMY  1981  . EYE SURGERY    . LOOP RECORDER INSERTION Left 01/13/2019   Procedure: LOOP RECORDER INSERTION;  Surgeon: Marinus Maw, MD;  Location: [facility] INVASIVE CV LAB;  Service: Cardiovascular;  Laterality: Left;  . ROTATOR CUFF REPAIR Right   . TEE WITHOUT CARDIOVERSION N/A 01/13/2019   Procedure: TRANSESOPHAGEAL ECHOCARDIOGRAM (TEE);  Surgeon: Thurmon Fair, MD;  Location: [facility] ENDOSCOPY;  Service: Cardiovascular;  Laterality: N/A;  loop    Family History  Problem Relation Age of Onset  . Heart attack Mother   . Hypertension Mother   . Heart disease Father   . Hypertension Father   . Stroke Neg Hx     Social History:  Lives alone and was independent PTA.  Has a caregiver who comes in for 4 hours/twice a week to help with errands/takes her out. She has been a homemaker. She reports that she has never smoked. She has never used  smokeless tobacco. She reports that she does not drink alcohol or use drugs.    Allergies  Allergen Reactions  . Phenothiazines Other (See Comments) and Anaphylaxis    Unknown reaction  . Prochlorperazine Anaphylaxis  . Benadryl [Diphenhydramine]     Agitation , confusion   . Penicillins     Yeast infections Did it involve swelling of the face/tongue/throat, SOB, or low BP? No Did it involve sudden or severe rash/hives, skin peeling, or any reaction on the inside of your  mouth or nose? No Did you need to seek medical attention at a hospital or doctor's office? No When did it last happen?30 yrs If all above answers are "NO", may proceed with cephalosporin use.   . Tetracycline     Other reaction(s): Other (See Comments) Yeast infection    Medications Prior to Admission  Medication Sig Dispense Refill  . aspirin 81 MG EC tablet Take 81 mg by mouth daily.      . Calcium Carb-Cholecalciferol (CALCIUM 500 +D PO) Take 1 tablet by mouth daily.     Melene Muller ON 01/15/2019] clopidogrel (PLAVIX) 75 MG tablet Take 1 tablet (75 mg total) by mouth daily. 30 tablet 0  . feeding supplement, ENSURE ENLIVE, (ENSURE ENLIVE) LIQD Take 237 mLs by mouth 2 (two) times daily between meals. 237 mL 12  . ferrous sulfate 325 (65 FE) MG tablet Take 1 tablet (325 mg total) by mouth 2 (two) times daily with a meal. 30 tablet 3  . ketotifen (ZADITOR) 0.025 % ophthalmic solution Place 1 drop into both eyes 2 (two) times daily as needed (itching).    . Melatonin 3 MG TABS Take 2 tablets (6 mg total) by mouth at bedtime as needed (sleep). 30 tablet 0  . Multiple Vitamin (MULTIVITAMIN) tablet Take 1 tablet by mouth daily.    . nadolol (CORGARD) 40 MG tablet TAKE 1 TABLET BY MOUTH EVERY DAY (Patient taking differently: Take 40 mg by mouth daily. ) 90 tablet 3  . omeprazole (PRILOSEC) 20 MG capsule TAKE 1 CAPSULE BY MOUTH EVERY DAY (Patient taking differently: Take 20 mg by mouth daily. ) 90 capsule 2  . simvastatin (ZOCOR) 40 MG tablet Take 1 tablet (40 mg total) by mouth daily at 6 PM. 30 tablet 0    Drug Regimen Review  Drug regimen was reviewed and remains appropriate with no significant issues identified  Home: Home Living Family/patient expects to be discharged to:: Inpatient rehab Living Arrangements: Alone Available Help at Discharge: Family, Available 24 hours/day Type of Home: House Home Access: Stairs to enter Secretary/administrator of Steps: 1 Home Layout: Two level,  Other (Comment)(has chair lift for stairs) Bathroom Shower/Tub: Other (comment)(walk in tub) Bathroom Toilet: Standard Home Equipment: Environmental consultant - 4 wheels, Shower seat, Grab bars - tub/shower Additional Comments: has 2 4 wheeled walkers one upstairs and one downstairs   Functional History: Prior Function Level of Independence: Independent with assistive device(s) Comments: use of rollator for mobility; has been in OPPT as recent as of a couple days ago working on balance  Functional Status:  Mobility: Bed Mobility Overal bed mobility: Needs Assistance Bed Mobility: Supine to Sit Supine to sit: Supervision, HOB elevated Sit to supine: Min guard General bed mobility comments: No assist needed but HOB elevated. Transfers Overall transfer level: Needs assistance Equipment used: 4-wheeled walker Transfers: Sit to/from Stand Sit to Stand: Min guard, Min assist Stand pivot transfers: Min guard Squat pivot transfers: Min assist General transfer comment: Assist to steady  in standing on first attempt to stand progressing to close Min guard on second stand. Inattention to placing LUE on rollator properly. Able to take a few steps to get to chair with Min A for balance.  Ambulation/Gait Ambulation/Gait assistance: Min guard Gait Distance (Feet): 100 Feet Assistive device: 4-wheeled walker Gait Pattern/deviations: Step-through pattern, Trunk flexed General Gait Details: Slow, short steps and guarded gait with rollator, cues for LUE placement. Running into things on left side throughout. Flexed posture. Difficulty navigating things on left side/finding rooms etc.  Gait velocity: decreased    ADL: ADL Overall ADL's : Needs assistance/impaired Eating/Feeding: Modified independent, Sitting Eating/Feeding Details (indicate cue type and reason): increased time due to LUE coordination and proprioception issues Grooming: Minimal assistance, Standing Grooming Details (indicate cue type and reason):  increased time due to LUE coordination and proprioception issues, reached for cup with left hand and knocked it off the counter, decreased control to get toothpaste on toothbrush. She was able to find the items she need for brushing her teeth (all 3 on left) Upper Body Bathing: Set up, Supervision/ safety, Sitting Upper Body Bathing Details (indicate cue type and reason): increased time due to LUE coordination and proprioception issues Lower Body Bathing: Minimal assistance, Sit to/from stand Lower Body Bathing Details (indicate cue type and reason): increased time due to LUE coordination and proprioception issues Upper Body Dressing : Minimal assistance, Sitting Upper Body Dressing Details (indicate cue type and reason): increased time due to LUE coordination and proprioception issues Lower Body Dressing: Minimal assistance, Sit to/from stand Lower Body Dressing Details (indicate cue type and reason): increased time due to LUE coordination and proprioception issues Toilet Transfer: Tax adviser Details (indicate cue type and reason): simulated with pivot from eob to recliner, good hand placement no cues required for sequencing Toileting- Clothing Manipulation and Hygiene: Minimal assistance, Sit to/from stand Toileting - Clothing Manipulation Details (indicate cue type and reason): simulated during transfer  Cognition: Cognition Overall Cognitive Status: Impaired/Different from baseline Orientation Level: Oriented X4 Cognition Arousal/Alertness: Awake/alert Behavior During Therapy: WFL for tasks assessed/performed Overall Cognitive Status: Impaired/Different from baseline Area of Impairment: Awareness Awareness: Intellectual General Comments: Able to state 1/3 words for short memory recall during session. Pt with left inattention to body and environment running into things on left occasionally and misplacing left hand on rollator without awareness.    Blood pressure (!)  146/67, pulse (!) 58, temperature 98.6 F (37 C), temperature source Oral, resp. rate 18, height 4\' 10"  (1.473 m), weight 35.4 kg, SpO2 95 %. Physical Exam  Nursing note and vitals reviewed. Constitutional: She appears well-developed and well-nourished. No distress.  HENT:  Head: Normocephalic and atraumatic.  Mouth/Throat: Oropharynx is clear and moist.  Eyes: Pupils are equal, round, and reactive to light. Conjunctivae and EOM are normal.  Neck: Normal range of motion. No tracheal deviation present. No thyromegaly present.  Cardiovascular: Normal rate and regular rhythm. Exam reveals no friction rub.  No murmur heard. Dressing on chest soaked thorough with serosanguinous drainage.   Respiratory: Effort normal. No respiratory distress. She has no wheezes.  Loop site saturated with serosang discharge.   GI: Soft. She exhibits no distension. There is no abdominal tenderness.  Musculoskeletal:        General: No deformity or edema.  Neurological: She is alert. Coordination normal.  Mild left central 7. Speech fairly clear. Mild left pronator drift. Strength 4/5 UE, and 3+ to 4/5 LE prox to distal. No sensory deficits. Cognitively demonstrates good  insight and awareness.   Skin: Skin is dry. No rash noted. She is not diaphoretic. No erythema.  Ecchymotic areas bilateral shins.   Psychiatric: She has a normal mood and affect. Her behavior is normal.    No results found for this or any previous visit (from the past 48 hour(s)). No results found.    Medical Problem List and Plan: 1.  Functional and mobility deficits secondary to right MCA infarct  -admit to inpatient rehab 2.  Antithrombotics: -DVT/anticoagulation:  Pharmaceutical: Lovenox  -antiplatelet therapy: ASA/Plavix X 3 weeks followed by Plavix alone  3. Pain Management: tylenol prn  4. Mood: LCSW to follow for evaluation and support.   -antipsychotic agents: n/a 5. Neuropsych: This patient is capable of making decisions on  her own behalf. 6. Skin/Wound Care: Left loop incision continues to ooze---change dressing left chest wall as needed with application of pressure relief measures as well as ice prn. Routine pressure relief measures.  7. Fluids/Electrolytes/Nutrition: Monitor I/O. Check lytes in am.  8.  HTN: Monitor blood pressures twice daily. 9.  Iron deficiency anemia: On iron supplement.  10.  Prediabetes: Hemoglobin A1c 5.8. Add CM restrictions and educate patient on diet.      Jacquelynn Cree, PA-C 01/14/2019

## 2019-01-13 NOTE — Interval H&P Note (Signed)
History and Physical Interval Note:  01/13/2019 12:13 PM  Madison Odom  has presented today for surgery, with the diagnosis of STORKE  The various methods of treatment have been discussed with the patient and family. After consideration of risks, benefits and other options for treatment, the patient has consented to  Procedure(s) with comments: TRANSESOPHAGEAL ECHOCARDIOGRAM (TEE) (N/A) - loop as a surgical intervention .  The patient's history has been reviewed, patient examined, no change in status, stable for surgery.  I have reviewed the patient's chart and labs.  Questions were answered to the patient's satisfaction.     Calisha Tindel

## 2019-01-13 NOTE — Progress Notes (Signed)
PROGRESS NOTE    Madison Odom  DYJ:[id number] DOB: [birth year] DOA: 01/09/2019 PCP: [name], NP    Brief Narrative: 85 year old with past medical history significant for GERD, HLD, scoliosis who presents with difficulty walking.  She reports that she actually has some difficulty walking at baseline but this morning she was having more difficulty with ambulation.  In the ED she was noted to have right side neglect.   Assessment & Plan:   Active Problems:   Acute CVA (cerebrovascular accident) (HCC)  Acute right parietal CVA; Acute/subacute infarct of the RIGHT parietal lobe MRI ; Acute ischemic infarct of the posterior right MCA territory, within the right parietal lobe. Echo normal ef.  LDL 80, globin A1c 5.8 On aspirin and plavix Plan for TEE  Today.  Will need loop recorder.  Monitor on telemetry. Disposition; CIR admission.   HTN; continue with nadolol  GERD continue with PPI  Iron deficiency anemia: Started  iron supplement. Iron 12, ferritin 7. Hb stable.   Insomnia; increase melatonin.    Estimated body mass index is 16.68 kg/m as calculated from the following:   Height as of 01/31/18: 4\' 10"  (1.473 m).   Weight as of 01/31/18: 36.2 kg.   DVT prophylaxis: heparin Code Status: full code Family Communication: daughter at bedside.  Disposition Plan: Needs TEE and CIR evaluation    Consultants:  Neurology   Procedures:   ECHO;  Doppler.   Antimicrobials:   none   Subjective: Denies chest pain.  Had loose stool. Does not wants to take laxative Objective: Vitals:   01/12/19 2327 01/13/19 0420 01/13/19 0850 01/13/19 1201  BP: 132/64 140/66 (!) 146/73 136/60  Pulse: (!) 59 62 67 62  Resp: 18 16 (!) 21 15  Temp: 98 F (36.7 C) 98.4 F (36.9 C) 97.7 F (36.5 C) 98.4 F (36.9 C)  TempSrc: Oral Oral Oral Oral  SpO2: 95% 94% 97% 97%    Intake/Output Summary (Last 24 hours) at 01/13/2019 1348 Last data filed at 01/13/2019 0500 Gross per 24  hour  Intake 290 ml  Output 100 ml  Net 190 ml   There were no vitals filed for this visit.  Examination:  General exam: NAD Respiratory system: CTA Cardiovascular system: S 1, S 2 RRR Gastrointestinal system: BS present, soft, nt Central nervous system: alert, motor strength 5/5/  Extremities: symmetric power.     Data Reviewed: I have personally reviewed following labs and imaging studies  CBC: Recent Labs  Lab 01/09/19 0844 01/10/19 0602 01/12/19 0536  WBC 7.1 9.5 8.0  NEUTROABS 4.8 6.2  --   HGB 8.8* 8.5* 8.6*  HCT 31.8* 29.7* 31.1*  MCV 70.5* 68.4* 70.5*  PLT 156 336 332   Basic Metabolic Panel: Recent Labs  Lab 01/09/19 0844 01/10/19 0602 01/12/19 0536  NA 138 139 138  K 3.7 3.6 3.5  CL 107 107 108  CO2 22 22 22   GLUCOSE 93 83 86  BUN 18 14 13   CREATININE 0.46 0.47 0.52  CALCIUM 8.4* 8.5* 8.3*   GFR: CrCl cannot be calculated (Unknown ideal weight.). Liver Function Tests: Recent Labs  Lab 01/10/19 0602  AST 25  ALT 15  ALKPHOS 51  BILITOT 0.5  PROT 5.8*  ALBUMIN 3.1*   No results for input(s): LIPASE, AMYLASE in the last 168 hours. No results for input(s): AMMONIA in the last 168 hours. Coagulation Profile: No results for input(s): INR, PROTIME in the last 168 hours. Cardiac Enzymes: No results for input(s):  CKTOTAL, CKMB, CKMBINDEX, TROPONINI in the last 168 hours. BNP (last 3 results) No results for input(s): PROBNP in the last 8760 hours. HbA1C: No results for input(s): HGBA1C in the last 72 hours. CBG: Recent Labs  Lab 01/10/19 0733 01/11/19 0003 01/11/19 0116 01/11/19 2313  GLUCAP 80 56* 148* 98   Lipid Profile: No results for input(s): CHOL, HDL, LDLCALC, TRIG, CHOLHDL, LDLDIRECT in the last 72 hours. Thyroid Function Tests: No results for input(s): TSH, T4TOTAL, FREET4, T3FREE, THYROIDAB in the last 72 hours. Anemia Panel: No results for input(s): VITAMINB12, FOLATE, FERRITIN, TIBC, IRON, RETICCTPCT in the last 72  hours. Sepsis Labs: No results for input(s): PROCALCITON, LATICACIDVEN in the last 168 hours.  No results found for this or any previous visit (from the past 240 hour(s)).       Radiology Studies: No results found.      Scheduled Meds: . aspirin EC  81 mg Oral Daily  . clopidogrel  75 mg Oral Daily  . feeding supplement (ENSURE ENLIVE)  237 mL Oral BID BM  . ferrous sulfate  325 mg Oral BID WC  . heparin  5,000 Units Subcutaneous Q8H  . nadolol  40 mg Oral Daily  . pantoprazole  40 mg Oral Daily  . simvastatin  40 mg Oral q1800   Continuous Infusions:    LOS: 4 days    Time spent: 35 minutes.     Alba Cory, MD [facility]  01/13/2019, 1:48 PM

## 2019-01-13 NOTE — Discharge Instructions (Signed)
Implant (heart monitor) site care instructions Keep incision clean and dry for 3 days. You can remove outer dressing tomorrow. Leave steri-strips (little pieces of tape) on until seen in the office for wound check appointment. Call the office [phone]) for redness, drainage, swelling, or fever.

## 2019-01-13 NOTE — Plan of Care (Signed)
  Problem: Education: Goal: Knowledge of disease or condition will improve Outcome: Progressing   Problem: Ischemic Stroke/TIA Tissue Perfusion: Goal: Complications of ischemic stroke/TIA will be minimized Outcome: Progressing   

## 2019-01-13 NOTE — Op Note (Signed)
INDICATIONS: cryptogenic stroke  PROCEDURE:   Informed consent was obtained prior to the procedure. The risks, benefits and alternatives for the procedure were discussed and the patient comprehended these risks.  Risks include, but are not limited to, cough, sore throat, vomiting, nausea, somnolence, esophageal and stomach trauma or perforation, bleeding, low blood pressure, aspiration, pneumonia, infection, trauma to the teeth and death.    After a procedural time-out, the oropharynx was anesthetized with 20% benzocaine spray.   During this procedure the patient was administered a total of Versed 2 mg to achieve and maintain moderate conscious sedation.  The patient's heart rate, blood pressure, and oxygen saturationweare monitored continuously during the procedure. The period of conscious sedation was 12 minutes, of which I was present face-to-face 100% of this time.  The transesophageal probe was inserted in the esophagus and stomach without difficulty and multiple views were obtained.  The patient was kept under observation until the patient left the procedure room.  The patient left the procedure room in stable condition.   Agitated microbubble saline contrast was administered.  COMPLICATIONS:    There were no immediate complications.  FINDINGS:  No cardiac source of embolism.  No evidence of LA thrombus (LA appears mildly dilated), normal valves, no ASD/PFO, no aortic plaques are seen.  RECOMMENDATIONS:     Proceed with loop recorder.  Time Spent Directly with the Patient:  30 minutes   Arlene Genova 01/13/2019, 3:14 PM

## 2019-01-13 NOTE — Progress Notes (Signed)
Physical Therapy Treatment Patient Details Name: Madison Odom MRN: [id number] DOB: 02-12-1934 Today's Date: 01/13/2019    History of Present Illness Patient is a 85 y/o female presenting to the ED on 01/09/19 with primary complaints of imbalance. CT of head was obtained.  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 PMH significant for scoliosis, pelvis fracture, mitral valve prolapse, hyperlipidemia, and GERD.    PT Comments    Patient progressing well towards PT goals. Continues to demonstrate left inattention to body and environment requiring assist/cues for left hand placement on rollator without awareness and assist for navigating environment and finding things/rooms on left side. Pt with poor awareness of this. Took 57 seconds to perform cognitive TUG (>15 sec indicative of high fall risk) demonstrating difficulty dual tasking, walking and counting tasks. Also able to recall 1/3 words during session indicating poor memory recall. Some of this memory issue might be baseline per pt. Continue to recommend CIR to maximize independence and mobility. Will follow.   Follow Up Recommendations  CIR     Equipment Recommendations  Other (comment)(defer)    Recommendations for Other Services       Precautions / Restrictions Precautions Precautions: Fall Precaution Comments: left inattention Restrictions Weight Bearing Restrictions: No    Mobility  Bed Mobility Overal bed mobility: Needs Assistance Bed Mobility: Supine to Sit     Supine to sit: Supervision;HOB elevated     General bed mobility comments: No assist needed but HOB elevated.  Transfers Overall transfer level: Needs assistance Equipment used: 4-wheeled walker Transfers: Sit to/from Stand Sit to Stand: Min guard;Min assist   Squat pivot transfers: Min assist     General transfer comment: Assist to steady in standing on first attempt to stand progressing to close Min guard on second stand.  Inattention to placing LUE on rollator properly. Able to take a few steps to get to chair with Min A for balance.   Ambulation/Gait Ambulation/Gait assistance: Min guard Gait Distance (Feet): 100 Feet Assistive device: 4-wheeled walker Gait Pattern/deviations: Step-through pattern;Trunk flexed Gait velocity: decreased   General Gait Details: Slow, short steps and guarded gait with rollator, cues for LUE placement. Running into things on left side throughout. Flexed posture. Difficulty navigating things on left side/finding rooms etc.    Stairs             Wheelchair Mobility    Modified Rankin (Stroke Patients Only) Modified Rankin (Stroke Patients Only) Pre-Morbid Rankin Score: Moderate disability Modified Rankin: Moderately severe disability     Balance Overall balance assessment: Needs assistance Sitting-balance support: Feet supported;No upper extremity supported Sitting balance-Leahy Scale: Good     Standing balance support: During functional activity;Bilateral upper extremity supported Standing balance-Leahy Scale: Poor Standing balance comment: Requires BUE support in standing vs external support for balance.                  Standardized Balance Assessment Standardized Balance Assessment : TUG: Timed Up and Go Test     Timed Up and Go Test TUG: Cognitive TUG Cognitive TUG (seconds): 57    Cognition Arousal/Alertness: Awake/alert Behavior During Therapy: WFL for tasks assessed/performed Overall Cognitive Status: Impaired/Different from baseline Area of Impairment: Awareness                           Awareness: Intellectual   General Comments: Able to state 1/3 words for short memory recall during session. Pt with left inattention to  body and environment running into things on left occasionally and misplacing left hand on rollator without awareness.       Exercises      General Comments        Pertinent Vitals/Pain Pain  Assessment: Faces Faces Pain Scale: Hurts a little bit Pain Location: chronic back pain/abdomen Pain Descriptors / Indicators: Discomfort;Aching Pain Intervention(s): Monitored during session;Repositioned    Home Living                      Prior Function            PT Goals (current goals can now be found in the care plan section) Progress towards PT goals: Progressing toward goals    Frequency    Min 4X/week      PT Plan Current plan remains appropriate    Co-evaluation              AM-PAC PT "6 Clicks" Mobility   Outcome Measure  Help needed turning from your back to your side while in a flat bed without using bedrails?: A Little Help needed moving from lying on your back to sitting on the side of a flat bed without using bedrails?: A Little Help needed moving to and from a bed to a chair (including a wheelchair)?: A Little Help needed standing up from a chair using your arms (e.g., wheelchair or bedside chair)?: A Little Help needed to walk in hospital room?: A Little Help needed climbing 3-5 steps with a railing? : A Lot 6 Click Score: 17    End of Session Equipment Utilized During Treatment: Gait belt Activity Tolerance: Patient tolerated treatment well Patient left: in chair;with call bell/phone within reach;with nursing/sitter in room Nurse Communication: Mobility status PT Visit Diagnosis: Muscle weakness (generalized) (M62.81);Unsteadiness on feet (R26.81);Other abnormalities of gait and mobility (R26.89)     Time: 2297-9892 PT Time Calculation (min) (ACUTE ONLY): 24 min  Charges:  $Gait Training: 8-22 mins $Neuromuscular Re-education: 8-22 mins                     Mylo Red, [place], DPT Acute Rehabilitation Services Pager [phone] Office [phone]       Madison Odom 01/13/2019, 9:06 AM

## 2019-01-13 NOTE — H&P (View-Only) (Signed)
  ELECTROPHYSIOLOGY CONSULT NOTE  Patient ID: Madison Odom MRN: [id number], DOB/AGE: 83/20/1935   Admit date: 01/09/2019 Date of Consult: 01/13/2019  Primary Physician: Nafziger, Cory, NP Primary Cardiologist: Dr. Nelson Reason for Consultation: Cryptogenic stroke ; recommendations regarding Implantable Loop Recorder, requested by Dr. Ahern  History of Present Illness Madison Odom was admitted on 01/09/2019 with stroke.   PMHx of GERD, HLD, MVP, OA, h/o falls.   They first developed symptoms upon waking with balance instability.  Neurology notes,right parietal infarct embolic secondary to unknown source, suspicious for AF.  she has undergone workup for stroke including echocardiogram and carotid angio.  The patient has been monitored on telemetry which has demonstrated sinus rhythm with no arrhythmias.  Inpatient stroke work-up is to be completed with a TEE.   Echocardiogram this admission demonstrated  IMPRESSIONS 1. The left ventricle has normal systolic function with an ejection fraction of 60-65%. The cavity size was normal. Moderate basal septal hypertrophy. Left ventricular diastolic parameters were normal.  2. The right ventricle has normal systolic function. The cavity was normal. There is no increase in right ventricular wall thickness.  3. The mitral valve is normal in structure.  4. The tricuspid valve is normal in structure.  5. The aortic valve is tricuspid Moderate thickening of the aortic valve Moderate calcification of the aortic valve. Aortic valve regurgitation is mild by color flow Doppler.  6. The pulmonic valve was normal in structure.    Lab work is reviewed.  Prior to admission, the patient denies chest pain, shortness of breath, dizziness, or syncope.   She reports going to PT to help with her ambulation and mobility with much improvement in the last several months.  She feels like her post CVA balance problems are improving.  She DOES have palpitations.  About  once a month or every couple months she will wake about 4AM with the sensation that her heart is beating fast.  She will sit up at the edge of the bed an over a few minutes settles down. She is recovering from her stroke with plans to CIR at discharge.   Past Medical History:  Diagnosis Date  . Anemia   . GERD (gastroesophageal reflux disease)   . Hyperlipidemia   . Hypertensive retinopathy   . MVP (mitral valve prolapse)   . OP (osteoporosis)   . Pelvis fracture (HCC) 09/2015   CLOSED   . Scoliosis   . Spastic colon      Surgical History:  Past Surgical History:  Procedure Laterality Date  . ABDOMINAL HYSTERECTOMY  1965  . APPENDECTOMY    . BILATERAL OOPHORECTOMY  1981  . EYE SURGERY    . ROTATOR CUFF REPAIR Right      Medications Prior to Admission  Medication Sig Dispense Refill Last Dose  . aspirin 81 MG EC tablet Take 81 mg by mouth daily.     Past Week at Unknown time  . Calcium Carb-Cholecalciferol (CALCIUM 500 +D PO) Take 1 tablet by mouth daily.    Past Week at Unknown time  . ibuprofen (ADVIL,MOTRIN) 200 MG tablet Take 200-400 mg by mouth every 6 (six) hours as needed for headache or mild pain.   Past Month at Unknown time  . ketotifen (ZADITOR) 0.025 % ophthalmic solution Place 1 drop into both eyes 2 (two) times daily as needed (itching).   Past Week at Unknown time  . Multiple Vitamin (MULTIVITAMIN) tablet Take 1 tablet by mouth daily.   Past Week   at Unknown time  . nadolol (CORGARD) 40 MG tablet TAKE 1 TABLET BY MOUTH EVERY DAY (Patient taking differently: Take 40 mg by mouth daily. ) 90 tablet 3 01/08/2019 at 0800  . omeprazole (PRILOSEC) 20 MG capsule TAKE 1 CAPSULE BY MOUTH EVERY DAY (Patient taking differently: Take 20 mg by mouth daily. ) 90 capsule 2 Past Week at Unknown time  . simvastatin (ZOCOR) 20 MG tablet TAKE 1 TABLET BY MOUTH EVERY DAY (Patient taking differently: Take 20 mg by mouth daily at 6 PM. ) 90 tablet 2 Past Week at Unknown time    Inpatient  Medications:  . aspirin EC  81 mg Oral Daily  . clopidogrel  75 mg Oral Daily  . feeding supplement (ENSURE ENLIVE)  237 mL Oral BID BM  . ferrous sulfate  325 mg Oral BID WC  . heparin  5,000 Units Subcutaneous Q8H  . nadolol  40 mg Oral Daily  . pantoprazole  40 mg Oral Daily  . simvastatin  40 mg Oral q1800    Allergies:  Allergies  Allergen Reactions  . Phenothiazines Other (See Comments) and Anaphylaxis    Unknown reaction  . Prochlorperazine Anaphylaxis  . Benadryl [Diphenhydramine]     Agitation , confusion   . Penicillins     Yeast infections Did it involve swelling of the face/tongue/throat, SOB, or low BP? No Did it involve sudden or severe rash/hives, skin peeling, or any reaction on the inside of your mouth or nose? No Did you need to seek medical attention at a hospital or doctor's office? No When did it last happen?30 yrs If all above answers are "NO", may proceed with cephalosporin use.   . Tetracycline     Other reaction(s): Other (See Comments) Yeast infection    Social History   Socioeconomic History  . Marital status: Widowed    Spouse name: Not on file  . Number of children: Not on file  . Years of education: Not on file  . Highest education level: Not on file  Occupational History  . Not on file  Social Needs  . Financial resource strain: Not on file  . Food insecurity:    Worry: Not on file    Inability: Not on file  . Transportation needs:    Medical: Not on file    Non-medical: Not on file  Tobacco Use  . Smoking status: Never Smoker  . Smokeless tobacco: Never Used  Substance and Sexual Activity  . Alcohol use: No  . Drug use: No  . Sexual activity: Not on file  Lifestyle  . Physical activity:    Days per week: Not on file    Minutes per session: Not on file  . Stress: Not on file  Relationships  . Social connections:    Talks on phone: Not on file    Gets together: Not on file    Attends religious service: Not on file      Active member of club or organization: Not on file    Attends meetings of clubs or organizations: Not on file    Relationship status: Not on file  . Intimate partner violence:    Fear of current or ex partner: Not on file    Emotionally abused: Not on file    Physically abused: Not on file    Forced sexual activity: Not on file  Other Topics Concern  . Not on file  Social History Narrative   1 year of college      Widow   Two daughters      Family History  Problem Relation Age of Onset  . Heart attack Mother   . Hypertension Mother   . Heart disease Father   . Hypertension Father   . Stroke Neg Hx       Review of Systems: All other systems reviewed and are otherwise negative except as noted above.  Physical Exam: Vitals:   01/12/19 2036 01/12/19 2327 01/13/19 0420 01/13/19 0850  BP: 124/63 132/64 140/66 (!) 146/73  Pulse: 62 (!) 59 62 67  Resp: 18 18 16 (!) 21  Temp: 98.1 F (36.7 C) 98 F (36.7 C) 98.4 F (36.9 C) 97.7 F (36.5 C)  TempSrc: Oral Oral Oral Oral  SpO2: 96% 95% 94% 97%    GEN- The patient is well appearing, alert and oriented x 3 today.   Head- normocephalic, atraumatic Eyes-  Sclera clear, conjunctiva pink Ears- hearing intact Oropharynx- clear Neck- supple Lungs-  CTA b/l, normal work of breathing Heart- RRR, no murmurs, rubs or gallops  GI- soft, NT, ND Extremities- no clubbing, cyanosis, or edema MS- she has significant scoliosis, thin body habitus, age appropriate atrophy. Skin- no rash or lesion Psych- euthymic mood, full affect   Labs:   Lab Results  Component Value Date   WBC 8.0 01/12/2019   HGB 8.6 (L) 01/12/2019   HCT 31.1 (L) 01/12/2019   MCV 70.5 (L) 01/12/2019   PLT 332 01/12/2019    Recent Labs  Lab 01/10/19 0602 01/12/19 0536  NA 139 138  K 3.6 3.5  CL 107 108  CO2 22 22  BUN 14 13  CREATININE 0.47 0.52  CALCIUM 8.5* 8.3*  PROT 5.8*  --   BILITOT 0.5  --   ALKPHOS 51  --   ALT 15  --   AST 25  --    GLUCOSE 83 86   Lab Results  Component Value Date   TROPONINI 0.07 (H) 09/22/2015   Lab Results  Component Value Date   CHOL 158 01/10/2019   CHOL 178 11/29/2017   CHOL 178 11/16/2016   Lab Results  Component Value Date   HDL 61 01/10/2019   HDL 69.80 11/29/2017   HDL 69.20 11/16/2016   Lab Results  Component Value Date   LDLCALC 80 01/10/2019   LDLCALC 90 11/29/2017   LDLCALC 89 11/16/2016   Lab Results  Component Value Date   TRIG 86 01/10/2019   TRIG 90.0 11/29/2017   TRIG 96.0 11/16/2016   Lab Results  Component Value Date   CHOLHDL 2.6 01/10/2019   CHOLHDL 3 11/29/2017   CHOLHDL 3 11/16/2016   No results found for: LDLDIRECT  No results found for: DDIMER   Radiology/Studies:   Ct Angio Head W Or Wo Contrast Result Date: 01/09/2019 CLINICAL DATA:  Left-sided neglect. EXAM: CT ANGIOGRAPHY HEAD AND NECK CT PERFUSION BRAIN TECHNIQUE: Multidetector CT imaging of the head and neck was performed using the standard protocol during bolus administration of intravenous contrast. Multiplanar CT image reconstructions and MIPs were obtained to evaluate the vascular anatomy. Carotid stenosis measurements (when applicable) are obtained utilizing NASCET criteria, using the distal internal carotid diameter as the denominator. Multiphase CT imaging of the brain was performed following IV bolus contrast injection. Subsequent parametric perfusion maps were calculated using RAPID software. CONTRAST:  100mL ISOVUE-370 IOPAMIDOL (ISOVUE-370) INJECTION 76% COMPARISON:  None. FINDINGS: CTA NECK FINDINGS Aortic arch: Normal variant 4 vessel aortic arch with the left vertebral artery arising directly   from the arch. Widely patent arch vessel origins. Right carotid system: Patent without evidence of stenosis or dissection. Mildly ectatic appearance of the mid to distal cervical ICA. Left carotid system: Patent without evidence of stenosis or dissection. Mildly ectatic appearance of the mid to  distal cervical ICA. Vertebral arteries: Patent without evidence of significant stenosis or dissection. Codominant. Skeleton: Mild-to-moderate cervical disc degeneration. Right C4-5 facet ankylosis. Other neck: Subcentimeter bilateral thyroid nodules. Complete right maxillary sinus opacification. Upper chest: Mild pleural-parenchymal scarring in the lung apices. Review of the MIP images confirms the above findings CTA HEAD FINDINGS Anterior circulation: The internal carotid arteries are widely patent from skull base to carotid termini. ACAs and MCAs are patent without evidence of significant proximal stenosis. There is a distal right M2 occlusion corresponding to the acute right parietal infarct. No aneurysm is identified. Posterior circulation: The intracranial vertebral arteries are widely patent to the basilar. Patent left PICA, right AICA, and bilateral SCAs are visualized. The basilar artery is widely patent. There are predominantly fetal type origins of both PCAs without evidence of significant PCA stenosis. No aneurysm is identified. Venous sinuses: Patent. Anatomic variants: Fetal type origins of both PCAs. Review of the MIP images confirms the above findings CT Brain Perfusion Findings: CBF (<30%) Volume: 0mL Perfusion (Tmax>6.0s) volume: 31mL Mismatch Volume: 31mL by automated RAPID processing, however the area of prolonged Tmax corresponds to the low-density established infarct on the earlier noncontrast head CT without evidence of significant penumbra. Infarction Location: Right parietal lobe IMPRESSION: 1. Distal right M2 occlusion. 2. Associated right parietal infarct without evidence of significant penumbra by CTP. 3. No significant stenosis in the proximal intracranial or cervical arterial circulation. These results were communicated to Dr. Arora at 11:20 am on 01/09/2019 by text page via the AMION messaging system. Electronically Signed   By: Allen  Grady M.D.   On: 01/09/2019 11:32    Ct Head Wo  Contrast Result Date: 01/09/2019 CLINICAL DATA:  Focal neural deficit of greater than 6 hours suspected stroke, LEFT hemi spatial neglect on exam, imbalance, history of hypertension EXAM: CT HEAD WITHOUT CONTRAST TECHNIQUE: Contiguous axial images were obtained from the base of the skull through the vertex without intravenous contrast. Sagittal and coronal MPR images reconstructed from axial data set. COMPARISON:  None FINDINGS: Brain: Generalized atrophy. Normal ventricular morphology. No midline shift or mass effect. Small vessel chronic ischemic changes of deep cerebral white matter. Loss of gray-white differentiation at the RIGHT parietal lobe consistent with cortical infarct. No intracranial hemorrhage, mass lesion or extra-axial fluid collection. No additional infarcts identified. Vascular: No definite hyperdense vessels Skull: Demineralized with biparietal thinning.  No fractures. Sinuses/Orbits: Complete opacification of RIGHT maxillary sinus and LEFT sphenoid sinus. Remaining visualized paranasal sinuses and mastoid air cells clear Other: N/A IMPRESSION: Atrophy with small vessel chronic ischemic changes of deep cerebral white matter. Acute/subacute infarct of the RIGHT parietal lobe where loss of gray-white differentiation is seen. RIGHT maxillary and LEFT sphenoid sinus disease changes. Electronically Signed   By: Mark  Boles M.D.   On: 01/09/2019 09:57     Mr Brain Wo Contrast Result Date: 01/10/2019 CLINICAL DATA:  Stroke follow-up. EXAM: MRI HEAD WITHOUT CONTRAST TECHNIQUE: Multiplanar, multiecho pulse sequences of the brain and surrounding structures were obtained without intravenous contrast. COMPARISON:  Head CT 01/09/2019 FINDINGS: The examination was discontinued prematurely due to patient pain. Only axial and coronal diffusion-weighted imaging was obtained. There is a intermediate sized area of acute ischemia within the posterior right MCA   territory, within the right parietal lobe. The area  of ischemia is roughly equal to that indicated on the perfusion scan. There is no contralateral ischemia or ischemia within a different vascular territory. There is no midline shift. IMPRESSION: 1. Truncated examination due to patient discomfort. 2. Acute ischemic infarct of the posterior right MCA territory, within the right parietal lobe. Electronically Signed   By: Kevin  Herman M.D.   On: 01/10/2019 14:28      12-lead ECG SR All prior EKG's in EPIC reviewed with no documented atrial fibrillation  Telemetry SR  Assessment and Plan:  1. Cryptogenic stroke The patient presents with cryptogenic stroke.  The patient has a TEE planned for this afternoon.  I spoke at length with the patient about monitoring for afib with either a 30 day event monitor or an implantable loop recorder.  Risks, benefits, and alteratives to implantable loop recorder were discussed with the patient today.   She does have some palpitations but can have months in-between episodes.  At this time, the patient is very clear in her decision to proceed with implantable loop recorder.   Wound care was reviewed with the patient (keep incision clean and dry for 3 days).  Wound check will be scheduled for the patient  Please call with questions.   Renee Lynn Ursuy, PA-C 01/13/2019  EP Attending  Patient seen and examined. Agree with above. The patient has had a cryptogenic stroke. She has had a nice recovery. She is pendiung TEE. I have reviewed the treatment options with the patient and her daughters. The risks/benefits/goals/expectations of insertion of an ILR were reviewed and she wishes to proceed.   ,M.D. 

## 2019-01-13 NOTE — Consult Note (Signed)
[facility] CM Primary Care Navigator  01/13/2019  FABIHA ROUGEAU [birth year] [id number]   Met with patient and daughters Mardene Celeste and Kathlee Nations) atthe bedsideto identify possible discharge needs.  Patient reports that she "did not feel right with difficulty walking" that had led to this admission. (acute right parietal CVA, HTN)  Patient endorses Beaulah Dinning with [facility] [place] her primary care provider.   [place] [address] to obtain medications without difficulty.  Patient verbalized that she hasbeenmanaging her own medications at home straight out of the containers but plans to use "pill box" system after discharge.  According to patient, daughters have been driving and providing transportation to her doctors' appointments.  Patient independently lives in her apartment and both daughters work. Daughters report having a private pay caregiver from [employer] that stays with patient twice a week for 4 hours a day and may be able to increase the time of her stay with patient depending on patient's needs.  Anticipated plan for discharge [place] per therapy recommendation.  Patient and daughtersvoiced understandingto callprimary care provider's office whenpatientreturns backhomefor a post discharge follow-upvisit within1- 2 weeksor sooner if needs arise.Patient letter (with PCP's contact number) was provided as areminder.   Explained topatient and daughtersaboutTHN CM services available for health management andresourcesat homebut denied any current needs or concerns for now. Patient states being able to manage her health needs so far.  Patient and daughters verbalizedunderstanding todiscuss with primary care provider onpatient's next visit,for anyfurther needsand assistance inmanaginghealthissues once she gets back to home.  Patient and  daughtersexpressedunderstandingto seekreferral from primary care provider to [facility] care management asdeemed necessary and appropriatefor servicesin the nearfuture- when patient returns tohome.   [facility] care management information was provided for future needs thatpatientmay have.  Primary care provider's office is listed as providing transition of care (TOC).  Noted EMMI stroke calls already in place to follow-up patient's recovery and they were made aware of it.    For additional questions please contact:  Edwena Felty A. Yareth Macdonnell, BSN, RN-BC [facility] PRIMARY CARE Navigator Cell: [phone]

## 2019-01-13 NOTE — PMR Pre-admission (Signed)
[facility] PMR Admission Coordinator Pre-Admission Assessment  Patient: Madison Odom is an 85 y.o., female MRN: [id number] DOB: 1934/06/04 Height: 4' 10"  (147.3 cm) Weight: 35.4 kg  Insurance Information HMO:    PPO: Yes     PCP:       IPA:       80/20:       OTHER: Group # [id number] PRIMARY: [employer] Medicare      Policy#: [id number]      Subscriber: patient CM Name: Sharrell Ku      Phone#: [phone]     Fax#: [phone] Pre-Cert#: [id number] for 7 days with follow up to Dorthula Nettles at [phone] and fax [phone]      Employer: Retired Benefits:  Phone #: [phone]     Name: On line portal Eff. Date: 11/13/18     Deduct:  $0      Out of Pocket Max: $925 (met 40)      Life Max: N/A CIR: $75 per admission      SNF: $0 days 1-100 Outpatient: medical necessity     Co-Pay: $10/visit Home Health: 100%      Co-Pay: none DME: 80%     Co-Pay: 20% Providers: in network  Medicaid Application Date:       Case Manager:  Disability Application Date:       Case Worker:   Emergency Contact Information Contact Information    Name Relation Home Work [place] Daughter [phone]     [name],[name] Daughter   [phone]      Current Medical History  Patient Admitting Diagnosis: R parietal lobe infarct  History of Present Illness: An 85 y.o. female with past medical history of scoliosis, pelvis fracture, mitral valve prolapse, hyperlipidemia, and GERD.  Patient was last known normal at 2300 hours.  Patient noted that when she woke up and started to use her walker that she felt off balance.  For that reason she was brought to the hospital.  When the MD in the emergency department examined her he noted that she had some neglect.  CT of head was obtained.  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  At that point neurology was called.  Due to findings of left-sided neglect and patient being within the 24-hour window patient was immediately  brought back to CT for a perfusion and CTA scan.  MRI positive for an acute ischemic infarct of the posterior right MCA territory, within the right parietal lobe. TEE completed and Loop recorder placed 01/13/19.  PT/OT evaluations were completed with recommendations for inpatient rehab admission.  Patient's medical record from [facility] has been reviewed by the rehabilitation admission coordinator and physician.  NIH Stroke scale: 0  Past Medical History  Past Medical History:  Diagnosis Date  . Anemia   . GERD (gastroesophageal reflux disease)   . Hyperlipidemia   . Hypertensive retinopathy   . MVP (mitral valve prolapse)   . OP (osteoporosis)   . Pelvis fracture ([place]) 09/2015   CLOSED   . Scoliosis   . Spastic colon     Family History   family history includes Heart attack in her mother; Heart disease in her father; Hypertension in her father and mother.  Prior Rehab/Hospitalizations Has the patient had major surgery during 100 days prior to admission? No   Current Medications  Current Facility-Administered Medications:  .  acetaminophen (TYLENOL) tablet 650 mg, 650 mg, Oral, Q4H PRN, 650 mg at 01/12/19 0620 **OR** acetaminophen (  TYLENOL) solution 650 mg, 650 mg, Per Tube, Q4H PRN **OR** acetaminophen (TYLENOL) suppository 650 mg, 650 mg, Rectal, Q4H PRN, Evans Lance, MD .  aspirin EC tablet 81 mg, 81 mg, Oral, Daily, Evans Lance, MD, 81 mg at 01/13/19 1039 .  clopidogrel (PLAVIX) tablet 75 mg, 75 mg, Oral, Daily, Evans Lance, MD, 75 mg at 01/13/19 1038 .  feeding supplement (ENSURE ENLIVE) (ENSURE ENLIVE) liquid 237 mL, 237 mL, Oral, BID BM, Evans Lance, MD, 237 mL at 01/10/19 1744 .  ferrous sulfate tablet 325 mg, 325 mg, Oral, BID WC, Evans Lance, MD, 325 mg at 01/13/19 1705 .  heparin injection 5,000 Units, 5,000 Units, Subcutaneous, Q8H, Evans Lance, MD, 5,000 Units at 01/14/19 [phone] .  Melatonin TABS 6 mg, 6 mg, Oral, QHS PRN, Evans Lance, MD, 6  mg at 01/12/19 2133 .  nadolol (CORGARD) tablet 40 mg, 40 mg, Oral, Daily, Evans Lance, MD, 40 mg at 01/13/19 1039 .  ondansetron (ZOFRAN) injection 4 mg, 4 mg, Intravenous, Q6H PRN, Evans Lance, MD .  pantoprazole (PROTONIX) EC tablet 40 mg, 40 mg, Oral, Daily, Evans Lance, MD, 40 mg at 01/13/19 1040 .  senna-docusate (Senokot-S) tablet 1 tablet, 1 tablet, Oral, QHS PRN, Evans Lance, MD .  simvastatin [facility]) tablet 40 mg, 40 mg, Oral, q1800, Evans Lance, MD, 40 mg at 01/13/19 1705  Patients Current Diet:   Diet Order            Diet Heart Room service appropriate? Yes; Fluid consistency: Thin  Diet effective now             Precautions / Restrictions Precautions Precautions: Fall Precaution Comments: left inattention Restrictions Weight Bearing Restrictions: No   Has the patient had 2 or more falls or a fall with injury in the past year?No  Prior Activity Level Limited Community (1-2x/wk): Went out 3 X a week, not working, not driving.  Prior Functional Level Prior Function Level of Independence: Independent with assistive device(s) Comments: use of rollator for mobility; has been in OPPT as recent as of a couple days ago working on balance   Self Care: Did the patient need help bathing, dressing, using the toilet or eating?  Independent  Indoor Mobility: Did the patient need assistance with walking from room to room (with or without device)? Independent  Stairs: Did the patient need assistance with internal or external stairs (with or without device)? Independent.  Note patient has a chair lift in her townhouse to the second level.  Functional Cognition: Did the patient need help planning regular tasks such as shopping or remembering to take medications? Independent  Home Assistive Devices / Equipment Home Equipment: Walker - 4 wheels, Shower seat, Grab bars - tub/shower  Prior Device Use: Indicate devices/aids used by the patient prior to current  illness, exacerbation or injury? Rollator  Prior Functional Level Comments: use of rollator for mobility; has been in OPPT as recent as of a couple days ago working on balance   Prior Functional Level Current Functional Level  Bed Mobility Independent supervision  Transfers Independent Min to minguard assist  Mobility - Walk/Wheelchair Independent Min to [employer] assistance  Mobility - Ambulation/Gait Independent Min guard  Upper Body Dressing Independent Minimal assistance, Sitting  Lower Body Dressing Independent Minimal assistance, Sit to/from stand  Grooming Independent Minimal assistance, Standing  Eating/Drinking Independent Modified independent, Sitting  Toilet Transfer Independent Min assistance  Bladder Continence WDL Using  BSC and up to bathroom with assist  Bowel Management  WDL Last BM 01/13/19  Stair Climbing Independent but has stair lift in her townhouse Not tested  Communication WDL No difficulties  Memory WDL WDL  Cooking/Meal Prep  Independent     Housework  Has a caregiver 2 X a week for 4 hours/day   Money Management  Independent   Driving  No driving.  Dtrs and caregiver drives     Special needs/care consideration BiPAP/CPAP No   CPM No Continuous Drip IV No Dialysis No  Life Vest No Oxygen No Special Bed No Trach Size No Wound Vac (area) No      Skin Has loop recorder with dressing mid chest (watch for bleeding at site).  Skin bruises easily and tears easily. Bowel mgmt: Last BM 01/13/19 Bladder mgmt: Voiding in bathroom with assist and on BSC Diabetic mgmt No  Previous Home Environment Living Arrangements: Alone Available Help at Discharge: Family, Available 24 hours/day Type of Home: House Home Layout: Two level, Other (Comment)(has chair lift for stairs) Home Access: Stairs to enter Entrance Stairs-Number of Steps: 1 Bathroom Shower/Tub: Other (comment)(walk in tub) Bathroom Toilet: Standard Additional Comments: has 2 4 wheeled walkers one  upstairs and one downstairs  Discharge Living Setting Plans for Discharge Living Setting: Alone Type of Home at Discharge: Other (Comment)(Town House) Discharge Home Layout: Two level, Other (Comment)(Chair lift to second level.) Alternate Level Stairs-Number of Steps: Chair lift to second level. Discharge Home Access: Stairs to enter Entrance Stairs-Rails: None Entrance Stairs-Number of Steps: 2 steps Discharge Bathroom Shower/Tub: Door, Other (comment)(Walk in tub) Discharge Bathroom Toilet: Standard Discharge Bathroom Accessibility: No  Social/Family/Support Systems Patient Roles: Parent(Has 2 daughters.) Contact Information: Stefani Dama - daughter - [phone] Anticipated Caregiver: Daughters, son in Sports coach, caregiver 2 X a week Ability/Limitations of Caregiver: 2 daughters work Building control surveyor Availability: Intermittent Discharge Plan Discussed with Primary Caregiver: Yes Is Caregiver In Agreement with Plan?: Yes Does Caregiver/Family have Issues with Lodging/Transportation while Pt is in Rehab?: No  Goals/Additional Needs Patient/Family Goal for Rehab: PT/OT mod I and supervision goals Expected length of stay: 7 days Cultural Considerations: [facility] Dietary Needs: Heart healthy, thin liquids Equipment Needs: TBD Pt/Family Agrees to Admission and willing to participate: Yes Program Orientation Provided & Reviewed with Pt/Caregiver Including Roles  & Responsibilities: Yes  Patient Condition: I have reviewed medical records from [facility], spoken with patient and her two daughters and the attending MD.  I met with patient and her two daughters at the bedside for inpatient rehabilitation assessment.  Patient will benefit from ongoingPT/OT, can actively participate in 3 hours of therapy a day 5 days of the week, and can make measurable gains during the admission.  Patient will also benefit from the coordinated team approach during an Inpatient Acute Rehabilitation admission.  The patient  will receive intensive therapy as well as Rehabilitation physician, nursing, social worker, and care management interventions.  Due to safety, skin/wound care, disease management, medical administration, pain management, patient education the patient requires 24 hour a day rehabilitation nursing.  The patient is currently min to min guard assist with mobility and basic ADLs.  Discharge setting and therapy post discharge at home with [facility] or outpatient therapy is anticipated.  Patient has agreed to participate in the Acute Inpatient Rehabilitation Program and will admit today.  Preadmission Screen Completed By:  Retta Diones, 01/14/2019 8:01 AM ______________________________________________________________________   Discussed status with Dr. Naaman Plummer on 01/14/19 at 1000 and received telephone approval  for admission today.  Admission Coordinator:  Retta Diones, time 1019/Date 01/14/19   Assessment/Plan: Diagnosis: right parietal infarct 1. Does the need for close, 24 hr/day  Medical supervision in concert with the patient's rehab needs make it unreasonable for this patient to be served in a less intensive setting? Yes 2. Co-Morbidities requiring supervision/potential complications: mvp, GERD 3. Due to bladder management, bowel management, safety, skin/wound care, disease management and medication administration, does the patient require 24 hr/day rehab nursing? Yes 4. Does the patient require coordinated care of a physician, rehab nurse, PT (1-2 hrs/day, 5 days/week) and OT (1-2 hrs/day, 5 days/week) to address physical and functional deficits in the context of the above medical diagnosis(es)? Yes Addressing deficits in the following areas: balance, endurance, locomotion, strength, transferring, bowel/bladder control, bathing, dressing, feeding, grooming, toileting and psychosocial support 5. Can the patient actively participate in an intensive therapy program of at least 3 hrs of therapy 5 days a week?  Yes 6. The potential for patient to make measurable gains while on inpatient rehab is excellent 7. Anticipated functional outcomes upon discharge from inpatients are: modified independent and supervision PT, modified independent and supervision OT, n/a SLP 8. Estimated rehab length of stay to reach the above functional goals is: 7 days 9. Anticipated D/C setting: Home 10. Anticipated post D/C treatments: HH therapy and Outpatient therapy 11. Overall Rehab/Functional Prognosis: excellent    RECOMMENDATIONS: This patient's condition is appropriate for continued rehabilitative care in the following setting: CIR Patient has agreed to participate in recommended program. Yes Note that insurance prior authorization may be required for reimbursement for recommended care.  Comment: Meredith Staggers, MD, [facility] 01/14/2019   Meredith Staggers, MD, [facility]   Jodell Cipro Jerilynn Mages 01/14/2019

## 2019-01-13 NOTE — Progress Notes (Signed)
IP rehab admissions - I met with patient's two daughters this past Friday.  I opened the case with [facility] medicare and I am waiting for response from insurance case manager.  Met with patient and her son in law this am.  They tell me TEE and Loop are scheduled for 2:30 this afternoon.  I will follow up later today.  Call me for questions.  [phone]

## 2019-01-14 ENCOUNTER — Other Ambulatory Visit: Payer: Self-pay

## 2019-01-14 ENCOUNTER — Inpatient Hospital Stay (HOSPITAL_COMMUNITY)
Admission: RE | Admit: 2019-01-14 | Discharge: 2019-01-18 | DRG: 057 | Disposition: A | Discharge status: Home Health | Payer: Medicare Other | Source: Intra-hospital | Attending: Physical Medicine & Rehabilitation | Admitting: Physical Medicine & Rehabilitation

## 2019-01-14 ENCOUNTER — Encounter (HOSPITAL_COMMUNITY): Payer: Self-pay | Admitting: Internal Medicine

## 2019-01-14 ENCOUNTER — Encounter (HOSPITAL_COMMUNITY): Payer: Self-pay

## 2019-01-14 DIAGNOSIS — G4700 Insomnia, unspecified: Secondary | ICD-10-CM | POA: Diagnosis present

## 2019-01-14 DIAGNOSIS — I63511 Cerebral infarction due to unspecified occlusion or stenosis of right middle cerebral artery: Secondary | ICD-10-CM | POA: Diagnosis not present

## 2019-01-14 DIAGNOSIS — E785 Hyperlipidemia, unspecified: Secondary | ICD-10-CM | POA: Diagnosis present

## 2019-01-14 DIAGNOSIS — Z681 Body mass index (BMI) 19 or less, adult: Secondary | ICD-10-CM | POA: Diagnosis not present

## 2019-01-14 DIAGNOSIS — D508 Other iron deficiency anemias: Secondary | ICD-10-CM

## 2019-01-14 DIAGNOSIS — D509 Iron deficiency anemia, unspecified: Secondary | ICD-10-CM | POA: Diagnosis present

## 2019-01-14 DIAGNOSIS — I69354 Hemiplegia and hemiparesis following cerebral infarction affecting left non-dominant side: Secondary | ICD-10-CM | POA: Diagnosis present

## 2019-01-14 DIAGNOSIS — I10 Essential (primary) hypertension: Secondary | ICD-10-CM | POA: Diagnosis present

## 2019-01-14 DIAGNOSIS — Z90710 Acquired absence of both cervix and uterus: Secondary | ICD-10-CM

## 2019-01-14 DIAGNOSIS — I272 Pulmonary hypertension, unspecified: Secondary | ICD-10-CM

## 2019-01-14 DIAGNOSIS — E7800 Pure hypercholesterolemia, unspecified: Secondary | ICD-10-CM | POA: Diagnosis present

## 2019-01-14 DIAGNOSIS — K219 Gastro-esophageal reflux disease without esophagitis: Secondary | ICD-10-CM | POA: Diagnosis present

## 2019-01-14 DIAGNOSIS — E46 Unspecified protein-calorie malnutrition: Secondary | ICD-10-CM | POA: Diagnosis present

## 2019-01-14 DIAGNOSIS — R414 Neurologic neglect syndrome: Secondary | ICD-10-CM | POA: Diagnosis not present

## 2019-01-14 DIAGNOSIS — R7303 Prediabetes: Secondary | ICD-10-CM | POA: Diagnosis present

## 2019-01-14 DIAGNOSIS — M419 Scoliosis, unspecified: Secondary | ICD-10-CM | POA: Diagnosis present

## 2019-01-14 MED ORDER — ASPIRIN EC 81 MG PO TBEC
81.0000 mg | DELAYED_RELEASE_TABLET | Freq: Every day | ORAL | Status: DC
  Administered 2019-01-15 – 2019-01-18 (×4): 81 mg via ORAL
  Filled 2019-01-14 (×4): qty 1

## 2019-01-14 MED ORDER — NADOLOL 40 MG PO TABS
40.0000 mg | ORAL_TABLET | Freq: Every day | ORAL | Status: DC
  Administered 2019-01-15 – 2019-01-18 (×4): 40 mg via ORAL
  Filled 2019-01-14 (×4): qty 1

## 2019-01-14 MED ORDER — FLEET ENEMA 7-19 GM/118ML RE ENEM: 1.0000 | ENEMA | Freq: Once | RECTAL | Status: DC | PRN

## 2019-01-14 MED ORDER — GUAIFENESIN-DM 100-10 MG/5ML PO SYRP: 5.0000 mL | ORAL_SOLUTION | Freq: Four times a day (QID) | ORAL | Status: DC | PRN

## 2019-01-14 MED ORDER — ACETAMINOPHEN 325 MG PO TABS: 325.0000 mg | ORAL_TABLET | ORAL | Status: DC | PRN

## 2019-01-14 MED ORDER — ONDANSETRON HCL 4 MG/2ML IJ SOLN: 4.0000 mg | Freq: Four times a day (QID) | INTRAMUSCULAR | Status: DC | PRN

## 2019-01-14 MED ORDER — FERROUS SULFATE 325 (65 FE) MG PO TABS: 325.0000 mg | ORAL_TABLET | Freq: Two times a day (BID) | ORAL | 3 refills | Status: DC

## 2019-01-14 MED ORDER — SIMVASTATIN 40 MG PO TABS: 40.0000 mg | ORAL_TABLET | Freq: Every day | ORAL | 0 refills | Status: DC

## 2019-01-14 MED ORDER — MELATONIN 3 MG PO TABS: 6.0000 mg | ORAL_TABLET | Freq: Every evening | ORAL | 0 refills | Status: DC | PRN

## 2019-01-14 MED ORDER — BISACODYL 10 MG RE SUPP: 10.0000 mg | Freq: Every day | RECTAL | Status: DC | PRN

## 2019-01-14 MED ORDER — FERROUS SULFATE 325 (65 FE) MG PO TABS
325.0000 mg | ORAL_TABLET | Freq: Two times a day (BID) | ORAL | Status: DC
  Administered 2019-01-14 – 2019-01-15 (×2): 325 mg via ORAL
  Filled 2019-01-14 (×2): qty 1

## 2019-01-14 MED ORDER — MELATONIN 3 MG PO TABS
6.0000 mg | ORAL_TABLET | Freq: Every evening | ORAL | Status: DC | PRN
  Administered 2019-01-14 – 2019-01-18 (×3): 6 mg via ORAL
  Filled 2019-01-14 (×5): qty 2

## 2019-01-14 MED ORDER — SIMVASTATIN 20 MG PO TABS
40.0000 mg | ORAL_TABLET | Freq: Every day | ORAL | Status: DC
  Administered 2019-01-14 – 2019-01-17 (×4): 40 mg via ORAL
  Filled 2019-01-14 (×4): qty 2

## 2019-01-14 MED ORDER — ONDANSETRON HCL 4 MG PO TABS: 4.0000 mg | ORAL_TABLET | Freq: Four times a day (QID) | ORAL | Status: DC | PRN

## 2019-01-14 MED ORDER — POLYETHYLENE GLYCOL 3350 17 G PO PACK: 17.0000 g | PACK | Freq: Every day | ORAL | Status: DC | PRN

## 2019-01-14 MED ORDER — TRAZODONE HCL 50 MG PO TABS
25.0000 mg | ORAL_TABLET | Freq: Every evening | ORAL | Status: DC | PRN
  Filled 2019-01-14: qty 1

## 2019-01-14 MED ORDER — ALUM & MAG HYDROXIDE-SIMETH 200-200-20 MG/5ML PO SUSP
30.0000 mL | ORAL | Status: DC | PRN
  Filled 2019-01-14 (×2): qty 30

## 2019-01-14 MED ORDER — CLOPIDOGREL BISULFATE 75 MG PO TABS
75.0000 mg | ORAL_TABLET | Freq: Every day | ORAL | Status: DC
  Administered 2019-01-15 – 2019-01-18 (×4): 75 mg via ORAL
  Filled 2019-01-14 (×4): qty 1

## 2019-01-14 MED ORDER — CLOPIDOGREL BISULFATE 75 MG PO TABS: 75.0000 mg | ORAL_TABLET | Freq: Every day | ORAL | 0 refills | Status: DC

## 2019-01-14 MED ORDER — ENOXAPARIN SODIUM 40 MG/0.4ML ~~LOC~~ SOLN
40.0000 mg | SUBCUTANEOUS | Status: DC
  Administered 2019-01-15 – 2019-01-18 (×4): 40 mg via SUBCUTANEOUS
  Filled 2019-01-14 (×4): qty 0.4

## 2019-01-14 MED ORDER — ADULT MULTIVITAMIN W/MINERALS CH
1.0000 | ORAL_TABLET | Freq: Every day | ORAL | Status: DC
  Administered 2019-01-15 – 2019-01-18 (×4): 1 via ORAL
  Filled 2019-01-14 (×9): qty 1

## 2019-01-14 MED ORDER — ENSURE ENLIVE PO LIQD: 237.0000 mL | Freq: Two times a day (BID) | ORAL | 12 refills | Status: DC

## 2019-01-14 MED ORDER — KETOTIFEN FUMARATE 0.025 % OP SOLN
1.0000 [drp] | Freq: Two times a day (BID) | OPHTHALMIC | Status: DC | PRN
  Filled 2019-01-14: qty 5

## 2019-01-14 NOTE — H&P (Signed)
Physical Medicine and Rehabilitation Admission H&P        Chief Complaint  Patient presents with  . Stroke with functional decline due to balance deficits and left inattention.      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  CTA head/neck done revealing right M2 occlusion and right parietal infarct without significant penumbra.  MRI brain done revealing acute ischemic infarct in posterior right MCA territory affecting right parietal lobe.  2D echo showed EF of 60 to 65% with moderate basal septal hypertrophy, normal mitral valve trivial regurg and moderate thickening of aortic valve with mild regurg.     She was found to have iron deficiency anemia with hemoglobin of 8.8 and started on iron supplement.  EKG with question of A fib and stroke felt to be embolic due to unknown source. She was started on DAPT with recommendations for to discontinue ASA after 3 weeks.   TEE negative for PFO or thrombus and Loop recorder placed on 3/2. She has had some bleeding from site that has been controlled with pressure. Patient with resultant left neglect with poor safety awareness. CIR recommended due to functional decline.      Review of Systems  Constitutional: Negative for fever.  HENT: Negative for hearing loss (mild) and tinnitus.   Eyes: Negative for double vision.  Respiratory: Negative for cough and wheezing.   Cardiovascular: Positive for leg swelling. Negative for chest pain and palpitations.  Gastrointestinal: Negative for heartburn.  Genitourinary: Negative for dysuria.  Musculoskeletal: Positive for back pain. Negative for falls and myalgias.       Has been clumsy--tends to run into things PTA.   Skin: Negative for rash.    Neurological: Negative for dizziness, tingling, tremors and headaches.  Psychiatric/Behavioral: Negative for suicidal ideas. The patient has insomnia (due to hospitalization. ).           Past Medical History:  Diagnosis Date  . Anemia    . GERD (gastroesophageal reflux disease)    . Hyperlipidemia    . Hypertension    . Hypertensive retinopathy    . MVP (mitral valve prolapse)    . OP (osteoporosis)    . Pelvis fracture (HCC) 09/2015    CLOSED   . Scoliosis    . Spastic colon             Past Surgical History:  Procedure Laterality Date  . ABDOMINAL HYSTERECTOMY   1965  . APPENDECTOMY      . BILATERAL OOPHORECTOMY   1981  . EYE SURGERY      . LOOP RECORDER INSERTION Left 01/13/2019    Procedure: LOOP RECORDER INSERTION;  Surgeon: Marinus Maw, MD;  Location[facility] INVASIVE CV LAB;  Service: Cardiovascular;  Laterality: Left;  . ROTATOR CUFF REPAIR Right    . TEE WITHOUT CARDIOVERSION N/A 01/13/2019    Procedure: TRANSESOPHAGEAL ECHOCARDIOGRAM (TEE);  Surgeon: Thurmon Fair, MD;  Location: [facility] ENDOSCOPY;  Service: Cardiovascular;  Laterality: N/A;  loop           Family History  Problem Relation Age of Onset  . Heart attack Mother    . Hypertension Mother    . Heart disease Father    . Hypertension Father    .  Stroke Neg Hx        Social History:  Lives alone and was independent PTA.  Has a caregiver who comes in for 4 hours/twice a week to help with errands/takes her out. She has been a homemaker. She reports that she has never smoked. She has never used smokeless tobacco. She reports that she does not drink alcohol or use drugs.           Allergies  Allergen Reactions  . Phenothiazines Other (See Comments) and Anaphylaxis      Unknown reaction  . Prochlorperazine Anaphylaxis  . Benadryl [Diphenhydramine]        Agitation , confusion   . Penicillins        Yeast infections Did it involve swelling of the face/tongue/throat, SOB, or low BP? No Did it involve  sudden or severe rash/hives, skin peeling, or any reaction on the inside of your mouth or nose? No Did you need to seek medical attention at a hospital or doctor's office? No When did it last happen?      30 yrs If all above answers are "NO", may proceed with cephalosporin use.    . Tetracycline        Other reaction(s): Other (See Comments) Yeast infection            Medications Prior to Admission  Medication Sig Dispense Refill  . aspirin 81 MG EC tablet Take 81 mg by mouth daily.        . Calcium Carb-Cholecalciferol (CALCIUM 500 +D PO) Take 1 tablet by mouth daily.       Melene Muller ON 01/15/2019] clopidogrel (PLAVIX) 75 MG tablet Take 1 tablet (75 mg total) by mouth daily. 30 tablet 0  . feeding supplement, ENSURE ENLIVE, (ENSURE ENLIVE) LIQD Take 237 mLs by mouth 2 (two) times daily between meals. 237 mL 12  . ferrous sulfate 325 (65 FE) MG tablet Take 1 tablet (325 mg total) by mouth 2 (two) times daily with a meal. 30 tablet 3  . ketotifen (ZADITOR) 0.025 % ophthalmic solution Place 1 drop into both eyes 2 (two) times daily as needed (itching).      . Melatonin 3 MG TABS Take 2 tablets (6 mg total) by mouth at bedtime as needed (sleep). 30 tablet 0  . Multiple Vitamin (MULTIVITAMIN) tablet Take 1 tablet by mouth daily.      . nadolol (CORGARD) 40 MG tablet TAKE 1 TABLET BY MOUTH EVERY DAY (Patient taking differently: Take 40 mg by mouth daily. ) 90 tablet 3  . omeprazole (PRILOSEC) 20 MG capsule TAKE 1 CAPSULE BY MOUTH EVERY DAY (Patient taking differently: Take 20 mg by mouth daily. ) 90 capsule 2  . simvastatin (ZOCOR) 40 MG tablet Take 1 tablet (40 mg total) by mouth daily at 6 PM. 30 tablet 0      Drug Regimen Review  Drug regimen was reviewed and remains appropriate with no significant issues identified   Home: Home Living Family/patient expects to be discharged to:: Inpatient rehab Living Arrangements: Alone Available Help at Discharge: Family, Available 24 hours/day Type  of Home: House Home Access: Stairs to enter [employer] of Steps: 1 Home Layout: Two level, Other (Comment)(has chair lift for stairs) Bathroom Shower/Tub: Other (comment)(walk in tub) Bathroom Toilet: Standard Home Equipment: Environmental consultant - 4 wheels, Shower seat, Grab bars - tub/shower Additional Comments: has 2 4 wheeled walkers one upstairs and one downstairs   Functional History: Prior Function Level of Independence: Independent with assistive  device(s) Comments: use of rollator for mobility; has been in OPPT as recent as of a couple days ago working on balance   Functional Status:  Mobility: Bed Mobility Overal bed mobility: Needs Assistance Bed Mobility: Supine to Sit Supine to sit: Supervision, HOB elevated Sit to supine: Min guard General bed mobility comments: No assist needed but HOB elevated. Transfers Overall transfer level: Needs assistance Equipment used: 4-wheeled walker Transfers: Sit to/from Stand Sit to Stand: Min guard, Min assist Stand pivot transfers: Min guard Squat pivot transfers: Min assist General transfer comment: Assist to steady in standing on first attempt to stand progressing to close Min guard on second stand. Inattention to placing LUE on rollator properly. Able to take a few steps to get to chair with Min A for balance.  Ambulation/Gait Ambulation/Gait assistance: Min guard Gait Distance (Feet): 100 Feet Assistive device: 4-wheeled walker Gait Pattern/deviations: Step-through pattern, Trunk flexed General Gait Details: Slow, short steps and guarded gait with rollator, cues for LUE placement. Running into things on left side throughout. Flexed posture. Difficulty navigating things on left side/finding rooms etc.  Gait velocity: decreased   ADL: ADL Overall ADL's : Needs assistance/impaired Eating/Feeding: Modified independent, Sitting Eating/Feeding Details (indicate cue type and reason): increased time due to LUE coordination and  proprioception issues Grooming: Minimal assistance, Standing Grooming Details (indicate cue type and reason): increased time due to LUE coordination and proprioception issues, reached for cup with left hand and knocked it off the counter, decreased control to get toothpaste on toothbrush. She was able to find the items she need for brushing her teeth (all 3 on left) Upper Body Bathing: Set up, Supervision/ safety, Sitting Upper Body Bathing Details (indicate cue type and reason): increased time due to LUE coordination and proprioception issues Lower Body Bathing: Minimal assistance, Sit to/from stand Lower Body Bathing Details (indicate cue type and reason): increased time due to LUE coordination and proprioception issues Upper Body Dressing : Minimal assistance, Sitting Upper Body Dressing Details (indicate cue type and reason): increased time due to LUE coordination and proprioception issues Lower Body Dressing: Minimal assistance, Sit to/from stand Lower Body Dressing Details (indicate cue type and reason): increased time due to LUE coordination and proprioception issues Toilet Transfer: Tax adviser Details (indicate cue type and reason): simulated with pivot from eob to recliner, good hand placement no cues required for sequencing Toileting- Clothing Manipulation and Hygiene: Minimal assistance, Sit to/from stand Toileting - Clothing Manipulation Details (indicate cue type and reason): simulated during transfer   Cognition: Cognition Overall Cognitive Status: Impaired/Different from baseline Orientation Level: Oriented X4 Cognition Arousal/Alertness: Awake/alert Behavior During Therapy: WFL for tasks assessed/performed Overall Cognitive Status: Impaired/Different from baseline Area of Impairment: Awareness Awareness: Intellectual General Comments: Able to state 1/3 words for short memory recall during session. Pt with left inattention to body and environment running into  things on left occasionally and misplacing left hand on rollator without awareness.      Blood pressure (!) 146/67, pulse (!) 58, temperature 98.6 F (37 C), temperature source Oral, resp. rate 18, height  (1.473 m), weight 35.4 kg, SpO2 95 %. Physical Exam  Nursing note and vitals reviewed. Constitutional: She appears well-developed and well-nourished. No distress.  HENT:  Head: Normocephalic and atraumatic.  Mouth/Throat: Oropharynx is clear and moist.  Eyes: Pupils are equal, round, and reactive to light. Conjunctivae and EOM are normal.  Neck: Normal range of motion. No tracheal deviation present. No thyromegaly present.  Cardiovascular: Normal rate and regular  rhythm. Exam reveals no friction rub.  No murmur heard. Dressing on chest soaked thorough with serosanguinous drainage.   Respiratory: Effort normal. No respiratory distress. She has no wheezes.  Loop site saturated with serosang discharge.   GI: Soft. She exhibits no distension. There is no abdominal tenderness.  Musculoskeletal:        General: No deformity or edema.  Neurological: She is alert. Coordination normal.  Mild left central 7. Speech fairly clear. Mild left pronator drift. Strength 4/5 UE, and 3+ to 4/5 LE prox to distal. No sensory deficits. Cognitively demonstrates good insight and awareness.   Skin: Skin is dry. No rash noted. She is not diaphoretic. No erythema.  Ecchymotic areas bilateral shins and on upper exts.   Psychiatric: She has a normal mood and affect. Her behavior is normal.      Lab Results Last 48 Hours  No results found for this or any previous visit (from the past 48 hour(s)).   Imaging Results (Last 48 hours)  No results found.         Medical Problem List and Plan: 1.  Functional and mobility deficits secondary to right MCA infarct             -admit to inpatient rehab 2.  Antithrombotics: -DVT/anticoagulation:  Pharmaceutical: Lovenox             -antiplatelet therapy:  ASA/Plavix X 3 weeks followed by Plavix alone  3. Pain Management: tylenol prn  4. Mood: LCSW to follow for evaluation and support.              -antipsychotic agents: n/a 5. Neuropsych: This patient is capable of making decisions on her own behalf. 6. Skin/Wound Care: Left loop incision continues to ooze---change dressing left chest wall as needed with application of pressure relief measures as well as ice prn. Routine pressure relief measures.  7. Fluids/Electrolytes/Nutrition: Monitor I/O. Check lytes in am.  8.  HTN: Monitor blood pressures twice daily. 9.  Iron deficiency anemia: On iron supplement.  10.  Prediabetes: Hemoglobin A1c 5.8. Add CM restrictions and educate patient on diet.        Jacquelynn Cree, PA-C 01/14/2019  Post Admission Physician Evaluation: 1. Functional deficits secondary  to Right parietal infarct. 2. Patient is admitted to receive collaborative, interdisciplinary care between the physiatrist, rehab nursing staff, and therapy team. 3. Patient's level of medical complexity and substantial therapy needs in context of that medical necessity cannot be provided at a lesser intensity of care such as a SNF. 4. Patient has experienced substantial functional loss from his/her baseline which was documented above under the "Functional History" and "Functional Status" headings.  Judging by the patient's diagnosis, physical exam, and functional history, the patient has potential for functional progress which will result in measurable gains while on inpatient rehab.  These gains will be of substantial and practical use upon discharge  in facilitating mobility and self-care at the household level. 5. Physiatrist will provide 24 hour management of medical needs as well as oversight of the therapy plan/treatment and provide guidance as appropriate regarding the interaction of the two. 6. The Preadmission Screening has been reviewed and patient status is unchanged unless otherwise stated  above. 7. 24 hour rehab nursing will assist with bladder management, bowel management, safety, skin/wound care, disease management, medication administration, pain management and patient education  and help integrate therapy concepts, techniques,education, etc. 8. PT will assess and treat for/with: Lower extremity strength, range of motion, stamina,  balance, functional mobility, safety, adaptive techniques and equipment, NMR, family education, community reentry.   Goals are: mod I to supervision. 9. OT will assess and treat for/with: ADL's, functional mobility, safety, upper extremity strength, adaptive techniques and equipment, NMR, family ed.   Goals are: mod I to supervision. Therapy may proceed with showering this patient. 10. SLP will assess and treat for/with: n/a.  Goals are: n/a. 11. Case Management and Social Worker will assess and treat for psychological issues and discharge planning. 12. Team conference will be held weekly to assess progress toward goals and to determine barriers to discharge. 13. Patient will receive at least 3 hours of therapy per day at least 5 days per week. 14. ELOS: 7 days       15. Prognosis:  excellent   I have personally performed a face to face diagnostic evaluation of this patient and formulated the key components of the plan.  Additionally, I have personally reviewed laboratory data, imaging studies, as well as relevant notes and concur with the physician assistant's documentation above.  Ranelle Oyster, MD, [place] [facility]

## 2019-01-14 NOTE — Discharge Summary (Signed)
Physician Discharge Summary  Madison Odom ZOX:[id number] DOB: 01-14-1934 DOA: 01/09/2019  PCP: Shirline FreesNafziger, Cory, NP  Admit date: 01/09/2019 Discharge date: 01/14/2019  Admitted From: Home  Disposition: CIR  Recommendations for Outpatient Follow-up:  1. Follow up with PCP in 1-2 weeks 2. Please obtain BMP/CBC in one week 3. Follow up with neurology post stroke follow up.  4. Follow up with EP post loop recorder insertion.  5. Repeat hb in 24 to 48 hours.   Discharge Condition: Stable.  CODE STATUS: full code Diet recommendation: Heart Healthy   Brief/Interim Summary: 85 year old with past medical history significant for GERD, HLD, scoliosis who presents with difficulty walking.  She reports that she actually has some difficulty walking at baseline but this morning she was having more difficulty with ambulation.  In the ED she was noted to have right side neglect.  Acute right parietal CVA; Acute/subacute infarct of the RIGHT parietal lobe MRI ; Acute ischemic infarct of the posterior right MCA territory, within the right parietal lobe. Echo normal ef.  LDL 80, globin A1c 5.8. on statins.  On aspirin and plavix, for 3 weeks then plavix alone.  S/P TEE negative for source of embolism, S/P Loop recorder insertion.  Monitor on telemetry. Disposition; CIR admission.   HTN; continue with nadolol  GERD continue with PPI  Iron deficiency anemia: Started  iron supplement. Iron 12, ferritin 7. Hb stable.   Insomnia; on  melatonin.   Loop recorder  site, small bleeding, pressure apply. Stop.    Discharge Diagnoses:  Active Problems:   Acute CVA (cerebrovascular accident) (HCC)   HTN   Iron deficiency anemia    Discharge Instructions  Discharge Instructions    Diet - low sodium heart healthy   Complete by:  As directed    Increase activity slowly   Complete by:  As directed      Allergies as of 01/14/2019      Reactions   Phenothiazines Other (See Comments),  Anaphylaxis   Unknown reaction   Prochlorperazine Anaphylaxis   Benadryl [diphenhydramine]    Agitation , confusion    Penicillins    Yeast infections Did it involve swelling of the face/tongue/throat, SOB, or low BP? No Did it involve sudden or severe rash/hives, skin peeling, or any reaction on the inside of your mouth or nose? No Did you need to seek medical attention at a hospital or doctor's office? No When did it last happen?30 yrs If all above answers are "NO", may proceed with cephalosporin use.   Tetracycline    Other reaction(s): Other (See Comments) Yeast infection      Medication List    STOP taking these medications   ibuprofen 200 MG tablet Commonly known as:  ADVIL,MOTRIN     TAKE these medications   aspirin 81 MG EC tablet Take 81 mg by mouth daily.   CALCIUM 500 +D PO Take 1 tablet by mouth daily.   clopidogrel 75 MG tablet Commonly known as:  PLAVIX Take 1 tablet (75 mg total) by mouth daily. Start taking on:  January 15, 2019   feeding supplement (ENSURE ENLIVE) Liqd Take 237 mLs by mouth 2 (two) times daily between meals.   ferrous sulfate 325 (65 FE) MG tablet Take 1 tablet (325 mg total) by mouth 2 (two) times daily with a meal.   ketotifen 0.025 % ophthalmic solution Commonly known as:  ZADITOR Place 1 drop into both eyes 2 (two) times daily as needed (itching).   Melatonin 3  MG Tabs Take 2 tablets (6 mg total) by mouth at bedtime as needed (sleep).   multivitamin tablet Take 1 tablet by mouth daily.   nadolol 40 MG tablet Commonly known as:  CORGARD TAKE 1 TABLET BY MOUTH EVERY DAY   omeprazole 20 MG capsule Commonly known as:  PRILOSEC TAKE 1 CAPSULE BY MOUTH EVERY DAY What changed:  how much to take   simvastatin 40 MG tablet Commonly known as:  ZOCOR Take 1 tablet (40 mg total) by mouth daily at 6 PM. What changed:    medication strength  how much to take  when to take this      Follow-up Information    [facility] Follow up.   Specialty:  Cardiology Why:  01/23/2019 :00PM, wound check visit Contact information: [address], [address] [place] [address] [phone]         Allergies  Allergen Reactions  . Phenothiazines Other (See Comments) and Anaphylaxis    Unknown reaction  . Prochlorperazine Anaphylaxis  . Benadryl [Diphenhydramine]     Agitation , confusion   . Penicillins     Yeast infections Did it involve swelling of the face/tongue/throat, SOB, or low BP? No Did it involve sudden or severe rash/hives, skin peeling, or any reaction on the inside of your mouth or nose? No Did you need to seek medical attention at a hospital or doctor's office? No When did it last happen?30 yrs If all above answers are "NO", may proceed with cephalosporin use.   . Tetracycline     Other reaction(s): Other (See Comments) Yeast infection    Consultations: Cardiology Neurology   Procedures/Studies: Ct Angio Head W Or Wo Contrast  Result Date: 01/09/2019 CLINICAL DATA:  Left-sided neglect. EXAM: CT ANGIOGRAPHY HEAD AND NECK CT PERFUSION BRAIN TECHNIQUE: Multidetector CT imaging of the head and neck was performed using the standard protocol during bolus administration of intravenous contrast. Multiplanar CT image reconstructions and MIPs were obtained to evaluate the vascular anatomy. Carotid stenosis measurements (when applicable) are obtained utilizing NASCET criteria, using the distal internal carotid diameter as the denominator. Multiphase CT imaging of the brain was performed following IV bolus contrast injection. Subsequent parametric perfusion maps were calculated using RAPID software. CONTRAST:  ISOVUE-370 IOPAMIDOL (ISOVUE-370) INJECTION 76% COMPARISON:  None. FINDINGS: CTA NECK FINDINGS Aortic arch: Normal variant 4 vessel aortic arch with the left vertebral artery arising directly from the arch. Widely patent arch vessel origins.  Right carotid system: Patent without evidence of stenosis or dissection. Mildly ectatic appearance of the mid to distal cervical ICA. Left carotid system: Patent without evidence of stenosis or dissection. Mildly ectatic appearance of the mid to distal cervical ICA. Vertebral arteries: Patent without evidence of significant stenosis or dissection. Codominant. Skeleton: Mild-to-moderate cervical disc degeneration. Right C4-5 facet ankylosis. Other neck: Subcentimeter bilateral thyroid nodules. Complete right maxillary sinus opacification. Upper chest: Mild pleural-parenchymal scarring in the lung apices. Review of the MIP images confirms the above findings CTA HEAD FINDINGS Anterior circulation: The internal carotid arteries are widely patent from skull base to carotid termini. ACAs and MCAs are patent without evidence of significant proximal stenosis. There is a distal right M2 occlusion corresponding to the acute right parietal infarct. No aneurysm is identified. Posterior circulation: The intracranial vertebral arteries are widely patent to the basilar. Patent left PICA, right AICA, and bilateral SCAs are visualized. The basilar artery is widely patent. There are predominantly fetal type origins of both  PCAs without evidence of significant PCA stenosis. No aneurysm is identified. Venous sinuses: Patent. Anatomic variants: Fetal type origins of both PCAs. Review of the MIP images confirms the above findings CT Brain Perfusion Findings: CBF (<30%) Volume: 39mL Perfusion (Tmax>6.0s) volume: 8mL Mismatch Volume: 38mL by automated RAPID processing, however the area of prolonged Tmax corresponds to the low-density established infarct on the earlier noncontrast head CT without evidence of significant penumbra. Infarction Location: Right parietal lobe IMPRESSION: 1. Distal right M2 occlusion. 2. Associated right parietal infarct without evidence of significant penumbra by CTP. 3. No significant stenosis in the proximal  intracranial or cervical arterial circulation. These results were communicated to Dr. Wilford Corner at 11:20 am on 01/09/2019 by text page via the [facility] messaging system. Electronically Signed   By: Sebastian Ache M.D.   On: 01/09/2019 11:32   Ct Head Wo Contrast  Result Date: 01/09/2019 CLINICAL DATA:  Focal neural deficit of greater than 6 hours suspected stroke, LEFT hemi spatial neglect on exam, imbalance, history of hypertension EXAM: CT HEAD WITHOUT CONTRAST TECHNIQUE: Contiguous axial images were obtained from the base of the skull through the vertex without intravenous contrast. Sagittal and coronal MPR images reconstructed from axial data set. COMPARISON:  None FINDINGS: Brain: Generalized atrophy. Normal ventricular morphology. No midline shift or mass effect. Small vessel chronic ischemic changes of deep cerebral white matter. Loss of gray-white differentiation at the RIGHT parietal lobe consistent with cortical infarct. No intracranial hemorrhage, mass lesion or extra-axial fluid collection. No additional infarcts identified. Vascular: No definite hyperdense vessels Skull: Demineralized with biparietal thinning.  No fractures. Sinuses/Orbits: Complete opacification of RIGHT maxillary sinus and LEFT sphenoid sinus. Remaining visualized paranasal sinuses and mastoid air cells clear Other: N/A IMPRESSION: Atrophy with small vessel chronic ischemic changes of deep cerebral white matter. Acute/subacute infarct of the RIGHT parietal lobe where loss of gray-white differentiation is seen. RIGHT maxillary and LEFT sphenoid sinus disease changes. Electronically Signed   By: Ulyses Southward M.D.   On: 01/09/2019 09:57   Ct Angio Neck W Or Wo Contrast  Result Date: 01/09/2019 CLINICAL DATA:  Left-sided neglect. EXAM: CT ANGIOGRAPHY HEAD AND NECK CT PERFUSION BRAIN TECHNIQUE: Multidetector CT imaging of the head and neck was performed using the standard protocol during bolus administration of intravenous contrast.  Multiplanar CT image reconstructions and MIPs were obtained to evaluate the vascular anatomy. Carotid stenosis measurements (when applicable) are obtained utilizing NASCET criteria, using the distal internal carotid diameter as the denominator. Multiphase CT imaging of the brain was performed following IV bolus contrast injection. Subsequent parametric perfusion maps were calculated using RAPID software. CONTRAST:  ISOVUE-370 IOPAMIDOL (ISOVUE-370) INJECTION 76% COMPARISON:  None. FINDINGS: CTA NECK FINDINGS Aortic arch: Normal variant 4 vessel aortic arch with the left vertebral artery arising directly from the arch. Widely patent arch vessel origins. Right carotid system: Patent without evidence of stenosis or dissection. Mildly ectatic appearance of the mid to distal cervical ICA. Left carotid system: Patent without evidence of stenosis or dissection. Mildly ectatic appearance of the mid to distal cervical ICA. Vertebral arteries: Patent without evidence of significant stenosis or dissection. Codominant. Skeleton: Mild-to-moderate cervical disc degeneration. Right C4-5 facet ankylosis. Other neck: Subcentimeter bilateral thyroid nodules. Complete right maxillary sinus opacification. Upper chest: Mild pleural-parenchymal scarring in the lung apices. Review of the MIP images confirms the above findings CTA HEAD FINDINGS Anterior circulation: The internal carotid arteries are widely patent from skull base to carotid termini. ACAs and MCAs are patent without evidence  of significant proximal stenosis. There is a distal right M2 occlusion corresponding to the acute right parietal infarct. No aneurysm is identified. Posterior circulation: The intracranial vertebral arteries are widely patent to the basilar. Patent left PICA, right AICA, and bilateral SCAs are visualized. The basilar artery is widely patent. There are predominantly fetal type origins of both PCAs without evidence of significant PCA stenosis. No  aneurysm is identified. Venous sinuses: Patent. Anatomic variants: Fetal type origins of both PCAs. Review of the MIP images confirms the above findings CT Brain Perfusion Findings: CBF (<30%) Volume: 0mL Perfusion (Tmax>6.0s) volume: 31mL Mismatch Volume: 31mL by automated RAPID processing, however the area of prolonged Tmax corresponds to the low-density established infarct on the earlier noncontrast head CT without evidence of significant penumbra. Infarction Location: Right parietal lobe IMPRESSION: 1. Distal right M2 occlusion. 2. Associated right parietal infarct without evidence of significant penumbra by CTP. 3. No significant stenosis in the proximal intracranial or cervical arterial circulation. These results were communicated to Dr. Wilford Corner at 11:20 am on 01/09/2019 by text page via the [facility] messaging system. Electronically Signed   By: Sebastian Ache M.D.   On: 01/09/2019 11:32   Mr Brain Wo Contrast  Result Date: 01/10/2019 CLINICAL DATA:  Stroke follow-up. EXAM: MRI HEAD WITHOUT CONTRAST TECHNIQUE: Multiplanar, multiecho pulse sequences of the brain and surrounding structures were obtained without intravenous contrast. COMPARISON:  Head CT 01/09/2019 FINDINGS: The examination was discontinued prematurely due to patient pain. Only axial and coronal diffusion-weighted imaging was obtained. There is a intermediate sized area of acute ischemia within the posterior right MCA territory, within the right parietal lobe. The area of ischemia is roughly equal to that indicated on the perfusion scan. There is no contralateral ischemia or ischemia within a different vascular territory. There is no midline shift. IMPRESSION: 1. Truncated examination due to patient discomfort. 2. Acute ischemic infarct of the posterior right MCA territory, within the right parietal lobe. Electronically Signed   By: Deatra Robinson M.D.   On: 01/10/2019 14:28   Ct Cerebral Perfusion W Contrast  Result Date: 01/09/2019 CLINICAL  DATA:  Left-sided neglect. EXAM: CT ANGIOGRAPHY HEAD AND NECK CT PERFUSION BRAIN TECHNIQUE: Multidetector CT imaging of the head and neck was performed using the standard protocol during bolus administration of intravenous contrast. Multiplanar CT image reconstructions and MIPs were obtained to evaluate the vascular anatomy. Carotid stenosis measurements (when applicable) are obtained utilizing NASCET criteria, using the distal internal carotid diameter as the denominator. Multiphase CT imaging of the brain was performed following IV bolus contrast injection. Subsequent parametric perfusion maps were calculated using RAPID software. CONTRAST:  ISOVUE-370 IOPAMIDOL (ISOVUE-370) INJECTION 76% COMPARISON:  None. FINDINGS: CTA NECK FINDINGS Aortic arch: Normal variant 4 vessel aortic arch with the left vertebral artery arising directly from the arch. Widely patent arch vessel origins. Right carotid system: Patent without evidence of stenosis or dissection. Mildly ectatic appearance of the mid to distal cervical ICA. Left carotid system: Patent without evidence of stenosis or dissection. Mildly ectatic appearance of the mid to distal cervical ICA. Vertebral arteries: Patent without evidence of significant stenosis or dissection. Codominant. Skeleton: Mild-to-moderate cervical disc degeneration. Right C4-5 facet ankylosis. Other neck: Subcentimeter bilateral thyroid nodules. Complete right maxillary sinus opacification. Upper chest: Mild pleural-parenchymal scarring in the lung apices. Review of the MIP images confirms the above findings CTA HEAD FINDINGS Anterior circulation: The internal carotid arteries are widely patent from skull base to carotid termini. ACAs and MCAs are patent without  evidence of significant proximal stenosis. There is a distal right M2 occlusion corresponding to the acute right parietal infarct. No aneurysm is identified. Posterior circulation: The intracranial vertebral arteries are widely  patent to the basilar. Patent left PICA, right AICA, and bilateral SCAs are visualized. The basilar artery is widely patent. There are predominantly fetal type origins of both PCAs without evidence of significant PCA stenosis. No aneurysm is identified. Venous sinuses: Patent. Anatomic variants: Fetal type origins of both PCAs. Review of the MIP images confirms the above findings CT Brain Perfusion Findings: CBF (<30%) Volume: 54mL Perfusion (Tmax>6.0s) volume: 67mL Mismatch Volume: 68mL by automated RAPID processing, however the area of prolonged Tmax corresponds to the low-density established infarct on the earlier noncontrast head CT without evidence of significant penumbra. Infarction Location: Right parietal lobe IMPRESSION: 1. Distal right M2 occlusion. 2. Associated right parietal infarct without evidence of significant penumbra by CTP. 3. No significant stenosis in the proximal intracranial or cervical arterial circulation. These results were communicated to Dr. Wilford Corner at 11:20 am on 01/09/2019 by text page via the [facility] messaging system. Electronically Signed   By: Sebastian Ache M.D.   On: 01/09/2019 11:32     Subjective: Doing well. Denies pain. She is having small amount of bleeding at loop recorder site. Pressure apply, bleeding stop.   Discharge Exam: Vitals:   01/14/19 0441 01/14/19 0749  BP: (!) 101/52 (!) 120/58  Pulse: (!) 51 64  Resp: 18 16  Temp: 98.4 F (36.9 C) 98.2 F (36.8 C)  SpO2: 93% 93%     General: Pt is alert, awake, not in acute distress Cardiovascular: RRR, S1/S2 +, no rubs, no gallops Respiratory: CTA bilaterally, no wheezing, no rhonchi Abdominal: Soft, NT, ND, bowel sounds + Extremities: no edema, no cyanosis    The results of significant diagnostics from this hospitalization (including imaging, microbiology, ancillary and laboratory) are listed below for reference.     Microbiology: No results found for this or any previous visit (from the past 240  hour(s)).   Labs: BNP (last 3 results) No results for input(s): BNP in the last 8760 hours. Basic Metabolic Panel: Recent Labs  Lab 01/09/19 0844 01/10/19 0602 01/12/19 0536  NA 138 139 138  K 3.7 3.6 3.5  CL 107 107 108  CO2 22 22 22   GLUCOSE 93 83 86  BUN 18 14 13   CREATININE 0.46 0.47 0.52  CALCIUM 8.4* 8.5* 8.3*   Liver Function Tests: Recent Labs  Lab 01/10/19 0602  AST 25  ALT 15  ALKPHOS 51  BILITOT 0.5  PROT 5.8*  ALBUMIN 3.1*   No results for input(s): LIPASE, AMYLASE in the last 168 hours. No results for input(s): AMMONIA in the last 168 hours. CBC: Recent Labs  Lab 01/09/19 0844 01/10/19 0602 01/12/19 0536  WBC 7.1 9.5 8.0  NEUTROABS 4.8 6.2  --   HGB 8.8* 8.5* 8.6*  HCT 31.8* 29.7* 31.1*  MCV 70.5* 68.4* 70.5*  PLT 156 336 332   Cardiac Enzymes: No results for input(s): CKTOTAL, CKMB, CKMBINDEX, TROPONINI in the last 168 hours. BNP: Invalid input(s): POCBNP CBG: Recent Labs  Lab 01/10/19 0733 01/11/19 0003 01/11/19 0116 01/11/19 2313  GLUCAP 80 56* 148* 98   D-Dimer No results for input(s): DDIMER in the last 72 hours. Hgb A1c No results for input(s): HGBA1C in the last 72 hours. Lipid Profile No results for input(s): CHOL, HDL, LDLCALC, TRIG, CHOLHDL, LDLDIRECT in the last 72 hours. Thyroid function studies No results  for input(s): TSH, T4TOTAL, T3FREE, THYROIDAB in the last 72 hours.  Invalid input(s): FREET3 Anemia work up No results for input(s): VITAMINB12, FOLATE, FERRITIN, TIBC, IRON, RETICCTPCT in the last 72 hours. Urinalysis    Component Value Date/Time   COLORURINE YELLOW 01/09/2019 0839   APPEARANCEUR HAZY (A) 01/09/2019 0839   LABSPEC 1.033 (H) 01/09/2019 0839   PHURINE 8.0 01/09/2019 0839   GLUCOSEU NEGATIVE 01/09/2019 0839   HGBUR NEGATIVE 01/09/2019 0839   BILIRUBINUR NEGATIVE 01/09/2019 0839   BILIRUBINUR n 11/16/2016 0928   KETONESUR 5 (A) 01/09/2019 0839   PROTEINUR NEGATIVE 01/09/2019 0839    UROBILINOGEN 0.2 11/16/2016 0928   UROBILINOGEN 0.2 09/22/2015 1153   NITRITE NEGATIVE 01/09/2019 0839   LEUKOCYTESUR NEGATIVE 01/09/2019 0839   Sepsis Labs Invalid input(s): PROCALCITONIN,  WBC,  LACTICIDVEN Microbiology No results found for this or any previous visit (from the past 240 hour(s)).   Time coordinating discharge: 40 minutes  SIGNED:   Alba Cory, MD  [facility]

## 2019-01-14 NOTE — Progress Notes (Signed)
Patient was admitted to 4W-16 via wheelchair. Patient was in possession of all personal belongings. Patient states she is in no pain and is A&Ox4. Patient has been made aware of IR schedules and verbalizes understanding. Patient is in bed with alarm on and call bell within reach. Reuben Likes, LPN

## 2019-01-14 NOTE — Progress Notes (Signed)
Madison Staggers, MD  Physician  Physical Medicine and Rehabilitation  PMR Pre-admission  Signed  Date of Service:  01/13/2019 3:49 PM       Related encounter: ED to Hosp-Admission (Current) from 01/09/2019 in [facility]      Signed         Show:Clear all [x] Manual[x] Template[x] Copied  Added by: [x] Madison Diones, RN[x] Madison Staggers, MD  [] Hover for details [employer] PMR Admission Coordinator Pre-Admission Assessment  Patient: Madison Odom is an 85 y.o., female MRN: [id number] DOB: November 17, 1933 Height: 4' 10"  (147.3 cm) Weight: 35.4 kg  Insurance Information HMO:    PPO: Yes     PCP:       IPA:       80/20:       OTHER: Group # [id number] PRIMARY: [employer] Medicare      Policy#: [id number]      Subscriber: patient CM Name: Madison Odom      Phone#: [phone]     Fax#: [phone] Pre-Cert#: [id number] for 7 days with follow up to Madison Odom at [phone] and fax [phone]      Employer: Retired Benefits:  Phone #: [phone]     Name: On line portal Eff. Date: 11/13/18     Deduct:  $0      Out of Pocket Max: $925 (met 40)      Life Max: N/A CIR: $75 per admission      SNF: $0 days 1-100 Outpatient: medical necessity     Co-Pay: $10/visit Home Health: 100%      Co-Pay: none DME: 80%     Co-Pay: 20% Providers: in network  Medicaid Application Date:       Case Manager:  Disability Application Date:       Case Worker:   Emergency Contact Information         Contact Information    Name Relation Home Work Madison Odom Madison Odom [phone]     Madison Odom Madison Odom   [phone]      Current Medical History  Patient Admitting Diagnosis: R parietal lobe infarct  History of Present Illness: An 85 y.o.femalewith past medical history of scoliosis, pelvis fracture, mitral valve prolapse, hyperlipidemia, and GERD. Patient was last known normal at 2300 hours. Patient noted that when she woke up and started  to use her walker that she felt off balance. For that reason she was brought to the hospital. When the MD in the emergency department examined her he noted that she had some neglect. CT of head was obtained. 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 At that point neurology was called. Due to findings of left-sided neglect and patient being within the 24-hour window patient was immediately brought back to CT for a perfusion and CTA scan.  MRI positive for an acute ischemic infarct of the posterior right MCA territory, within the right parietal lobe. TEE completed and Loop recorder placed 01/13/19.  PT/OT evaluations were completed with recommendations for inpatient rehab admission.  Patient[facility] medical record from [facility] has been reviewed by the rehabilitation admission coordinator and physician.  NIH Stroke scale: 0  Past Medical History  Past Medical History:  Diagnosis Date  . Anemia   . GERD (gastroesophageal reflux disease)   . Hyperlipidemia   . Hypertensive retinopathy   . MVP (mitral valve prolapse)   . OP (osteoporosis)   . Pelvis fracture ([place]) 09/2015   CLOSED   . Scoliosis   .  Spastic colon     Family History   family history includes Heart attack in her mother; Heart disease in her father; Hypertension in her father and mother.  Prior Rehab/Hospitalizations Has the patient had major surgery during 100 days prior to admission? No              Current Medications  Current Facility-Administered Medications:  .  acetaminophen (TYLENOL) tablet 650 mg, 650 mg, Oral, Q4H PRN, 650 mg at 01/12/19 0620 **OR** acetaminophen (TYLENOL) solution 650 mg, 650 mg, Per Tube, Q4H PRN **OR** acetaminophen (TYLENOL) suppository 650 mg, 650 mg, Rectal, Q4H PRN, Evans Lance, MD .  aspirin EC tablet 81 mg, 81 mg, Oral, Daily, Evans Lance, MD, 81 mg at 01/13/19 1039 .  clopidogrel (PLAVIX) tablet 75 mg, 75 mg, Oral, Daily, Evans Lance,  MD, 75 mg at 01/13/19 1038 .  feeding supplement (ENSURE ENLIVE) (ENSURE ENLIVE) liquid 237 mL, 237 mL, Oral, BID BM, Evans Lance, MD, 237 mL at 01/10/19 1744 .  ferrous sulfate tablet 325 mg, 325 mg, Oral, BID WC, Evans Lance, MD, 325 mg at 01/13/19 1705 .  heparin injection 5,000 Units, 5,000 Units, Subcutaneous, Q8H, Evans Lance, MD, 5,000 Units at 01/14/19 [phone] .  Melatonin TABS 6 mg, 6 mg, Oral, QHS PRN, Evans Lance, MD, 6 mg at 01/12/19 2133 .  nadolol (CORGARD) tablet 40 mg, 40 mg, Oral, Daily, Evans Lance, MD, 40 mg at 01/13/19 1039 .  ondansetron (ZOFRAN) injection 4 mg, 4 mg, Intravenous, Q6H PRN, Evans Lance, MD .  pantoprazole (PROTONIX) EC tablet 40 mg, 40 mg, Oral, Daily, Evans Lance, MD, 40 mg at 01/13/19 1040 .  senna-docusate (Senokot-S) tablet 1 tablet, 1 tablet, Oral, QHS PRN, Evans Lance, MD .  simvastatin [facility]) tablet 40 mg, 40 mg, Oral, q1800, Evans Lance, MD, 40 mg at 01/13/19 1705  Patients Current Diet:      Diet Order                  Diet Heart Room service appropriate? Yes; Fluid consistency: Thin  Diet effective now              Precautions / Restrictions Precautions Precautions: Fall Precaution Comments: left inattention Restrictions Weight Bearing Restrictions: No   Has the patient had 2 or more falls or a fall with injury in the past year?No  Prior Activity Level Limited Community (1-2x/wk): Went out 3 X a week, not working, not driving.  Prior Functional Level Prior Function Level of Independence: Independent with assistive device(s) Comments: use of rollator for mobility; has been in OPPT as recent as of a couple days ago working on balance   Self Care: Did the patient need help bathing, dressing, using the toilet or eating?  Independent  Indoor Mobility: Did the patient need assistance with walking from room to room (with or without device)? Independent  Stairs: Did the patient need  assistance with internal or external stairs (with or without device)? Independent.  Note patient has a chair lift in her townhouse to the second level.  Functional Cognition: Did the patient need help planning regular tasks such as shopping or remembering to take medications? Independent  Home Assistive Devices / Equipment Home Equipment: Walker - 4 wheels, Shower seat, Grab bars - tub/shower  Prior Device Use: Indicate devices/aids used by the patient prior to current illness, exacerbation or injury? Rollator  Prior Functional Level Comments: use of rollator  for mobility; has been in OPPT as recent as of a couple days ago working on balance   Prior Functional Level Current Functional Level  Bed Mobility Independent supervision  Transfers Independent Min to minguard assist  Mobility - Walk/Wheelchair Independent Min to [employer] assistance  Mobility - Ambulation/Gait Independent Min guard  Upper Body Dressing Independent Minimal assistance, Sitting  Lower Body Dressing Independent Minimal assistance, Sit to/from stand  Grooming Independent Minimal assistance, Standing  Eating/Drinking Independent Modified independent, Sitting  Toilet Transfer Independent Min assistance  Bladder Continence WDL Using BSC and up to bathroom with assist  Bowel Management  WDL Last BM 01/13/19  Stair Climbing Independent but has stair lift in her townhouse Not tested  Communication WDL No difficulties  Memory WDL WDL  Cooking/Meal Prep  Independent     Housework  Has a caregiver 2 X a week for 4 hours/day   Money Management  Independent   Driving  No driving.  Dtrs and caregiver drives     Special needs/care consideration BiPAP/CPAP No           CPM No Continuous Drip IV No Dialysis No  Life Vest No Oxygen No Special Bed No Trach Size No Wound Vac (area) No      Skin Has loop recorder with dressing mid chest (watch for bleeding at site).  Skin bruises easily and tears  easily. Bowel mgmt: Last BM 01/13/19 Bladder mgmt: Voiding in bathroom with assist and on BSC Diabetic mgmt No  Previous Home Environment Living Arrangements: Alone Available Help at Discharge: Family, Available 24 hours/day Type of Home: House Home Layout: Two level, Other (Comment)(has chair lift for stairs) Home Access: Stairs to enter Entrance Stairs-Number of Steps: 1 Bathroom Shower/Tub: Other (comment)(walk in tub) Bathroom Toilet: Standard Additional Comments: has 2 4 wheeled walkers one upstairs and one downstairs  Discharge Living Setting Plans for Discharge Living Setting: Alone Type of Home at Discharge: Other (Comment)(Town House) Discharge Home Layout: Two level, Other (Comment)(Chair lift to second level.) Alternate Level Stairs-Number of Steps: Chair lift to second level. Discharge Home Access: Stairs to enter Entrance Stairs-Rails: None Entrance Stairs-Number of Steps: 2 steps Discharge Bathroom Shower/Tub: Door, Other (comment)(Walk in tub) Discharge Bathroom Toilet: Standard Discharge Bathroom Accessibility: No  Social/Family/Support Systems Patient Roles: Parent(Has 2 daughters.) Contact Information: Stefani Dama - Madison Odom - [phone] Anticipated Caregiver: Daughters, son in Sports coach, caregiver 2 X a week Ability/Limitations of Caregiver: 2 daughters work Building control surveyor Availability: Intermittent Discharge Plan Discussed with Primary Caregiver: Yes Is Caregiver In Agreement with Plan?: Yes Does Caregiver/Family have Issues with Lodging/Transportation while Pt is in Rehab?: No  Goals/Additional Needs Patient/Family Goal for Rehab: PT/OT mod I and supervision goals Expected length of stay: 7 days Cultural Considerations: [facility] Dietary Needs: Heart healthy, thin liquids Equipment Needs: TBD Pt/Family Agrees to Admission and willing to participate: Yes Program Orientation Provided & Reviewed with Pt/Caregiver Including Roles  & Responsibilities:  Yes  Patient Condition: I have reviewed medical records from [facility], spoken with patient and her two daughters and the attending MD.  I met with patient and her two daughters at the bedside for inpatient rehabilitation assessment.  Patient will benefit from ongoingPT/OT, can actively participate in 3 hours of therapy a day 5 days of the week, and can make measurable gains during the admission.  Patient will also benefit from the coordinated team approach during an Inpatient Acute Rehabilitation admission.  The patient will receive intensive therapy as well as Rehabilitation physician, nursing, social worker,  and care management interventions.  Due to safety, skin/wound care, disease management, medical administration, pain management, patient education the patient requires 24 hour a day rehabilitation nursing.  The patient is currently min to min guard assist with mobility and basic ADLs.  Discharge setting and therapy post discharge at home with [facility] or outpatient therapy is anticipated.  Patient has agreed to participate in the Acute Inpatient Rehabilitation Program and will admit today.  Preadmission Screen Completed By:  Madison Odom, 01/14/2019 8:01 AM ______________________________________________________________________   Discussed status with Dr. Naaman Plummer on 01/14/19 at 28 and received telephone approval for admission today.  Admission Coordinator:  Madison Odom, time 1019/Date 01/14/19   Assessment/Plan: Diagnosis: right parietal infarct 1. Does the need for close, 24 hr/day  Medical supervision in concert with the patient's rehab needs make it unreasonable for this patient to be served in a less intensive setting? Yes 2. Co-Morbidities requiring supervision/potential complications: mvp, GERD 3. Due to bladder management, bowel management, safety, skin/wound care, disease management and medication administration, does the patient require 24 hr/day rehab nursing? Yes 4. Does the  patient require coordinated care of a physician, rehab nurse, PT (1-2 hrs/day, 5 days/week) and OT (1-2 hrs/day, 5 days/week) to address physical and functional deficits in the context of the above medical diagnosis(es)? Yes Addressing deficits in the following areas: balance, endurance, locomotion, strength, transferring, bowel/bladder control, bathing, dressing, feeding, grooming, toileting and psychosocial support 5. Can the patient actively participate in an intensive therapy program of at least 3 hrs of therapy 5 days a week? Yes 6. The potential for patient to make measurable gains while on inpatient rehab is excellent 7. Anticipated functional outcomes upon discharge from inpatients are: modified independent and supervision PT, modified independent and supervision OT, n/a SLP 8. Estimated rehab length of stay to reach the above functional goals is: 7 days 9. Anticipated D/C setting: Home 10. Anticipated post D/C treatments: HH therapy and Outpatient therapy 11. Overall Rehab/Functional Prognosis: excellent    RECOMMENDATIONS: This patient's condition is appropriate for continued rehabilitative care in the following setting: CIR Patient has agreed to participate in recommended program. Yes Note that insurance prior authorization may be required for reimbursement for recommended care.  Comment: Madison Staggers, MD, [facility] 01/14/2019   Madison Staggers, MD, [facility]   Jodell Cipro M 01/14/2019        Revision History

## 2019-01-15 ENCOUNTER — Inpatient Hospital Stay (HOSPITAL_COMMUNITY): Payer: Medicare Other | Admitting: Occupational Therapy

## 2019-01-15 ENCOUNTER — Ambulatory Visit: Payer: Medicare Other | Admitting: Physical Therapy

## 2019-01-15 ENCOUNTER — Inpatient Hospital Stay (HOSPITAL_COMMUNITY): Payer: Medicare Other | Admitting: Physical Therapy

## 2019-01-15 ENCOUNTER — Inpatient Hospital Stay (HOSPITAL_COMMUNITY): Payer: Medicare Other

## 2019-01-15 DIAGNOSIS — R414 Neurologic neglect syndrome: Secondary | ICD-10-CM

## 2019-01-15 DIAGNOSIS — D509 Iron deficiency anemia, unspecified: Secondary | ICD-10-CM

## 2019-01-15 LAB — CBC WITH DIFFERENTIAL/PLATELET
Abs Immature Granulocytes: 0.2 10*3/uL — ABNORMAL HIGH (ref 0.00–0.07)
Basophils Absolute: 0.1 10*3/uL (ref 0.0–0.1)
Basophils Relative: 1 %
Eosinophils Absolute: 0 10*3/uL (ref 0.0–0.5)
Eosinophils Relative: 0 %
HCT: 31.2 % — ABNORMAL LOW (ref 36.0–46.0)
Hemoglobin: 8.9 g/dL — ABNORMAL LOW (ref 12.0–15.0)
LYMPHS PCT: 13 %
Lymphs Abs: 0.9 10*3/uL (ref 0.7–4.0)
MCH: 20.3 pg — ABNORMAL LOW (ref 26.0–34.0)
MCHC: 28.5 g/dL — ABNORMAL LOW (ref 30.0–36.0)
MCV: 71.1 fL — AB (ref 80.0–100.0)
MONO ABS: 0.3 10*3/uL (ref 0.1–1.0)
Monocytes Relative: 4 %
Neutro Abs: 5.2 10*3/uL (ref 1.7–7.7)
Neutrophils Relative %: 79 %
Platelets: 306 10*3/uL (ref 150–400)
Promyelocytes Relative: 3 %
RBC: 4.39 MIL/uL (ref 3.87–5.11)
RDW: 21.6 % — AB (ref 11.5–15.5)
WBC: 6.6 10*3/uL (ref 4.0–10.5)
nRBC: 0 % (ref 0.0–0.2)
nRBC: 0 /100 WBC

## 2019-01-15 LAB — COMPREHENSIVE METABOLIC PANEL
ALT: 57 U/L — ABNORMAL HIGH (ref 0–44)
AST: 87 U/L — ABNORMAL HIGH (ref 15–41)
Albumin: 2.7 g/dL — ABNORMAL LOW (ref 3.5–5.0)
Alkaline Phosphatase: 65 U/L (ref 38–126)
Anion gap: 9 (ref 5–15)
BUN: 13 mg/dL (ref 8–23)
CO2: 25 mmol/L (ref 22–32)
Calcium: 8.6 mg/dL — ABNORMAL LOW (ref 8.9–10.3)
Chloride: 104 mmol/L (ref 98–111)
Creatinine, Ser: 0.49 mg/dL (ref 0.44–1.00)
GFR calc Af Amer: >60 mL/min (ref >60)
Glucose, Bld: 86 mg/dL (ref 70–99)
Potassium: 3.5 mmol/L (ref 3.5–5.1)
Sodium: 138 mmol/L (ref 135–145)
Total Bilirubin: 0.3 mg/dL (ref 0.3–1.2)
Total Protein: 5.2 g/dL — ABNORMAL LOW (ref 6.5–8.1)

## 2019-01-15 MED ORDER — BOOST / RESOURCE BREEZE PO LIQD CUSTOM
1.0000 | Freq: Three times a day (TID) | ORAL | Status: DC
  Administered 2019-01-15 – 2019-01-18 (×7): 1 via ORAL

## 2019-01-15 MED ORDER — BOOST / RESOURCE BREEZE PO LIQD CUSTOM: 1.0000 | Freq: Three times a day (TID) | ORAL | Status: DC

## 2019-01-15 MED ORDER — POLYSACCHARIDE IRON COMPLEX 150 MG PO CAPS
150.0000 mg | ORAL_CAPSULE | Freq: Every day | ORAL | Status: DC
  Administered 2019-01-16: 150 mg via ORAL
  Filled 2019-01-15: qty 1

## 2019-01-15 MED ORDER — CALCIUM POLYCARBOPHIL 625 MG PO TABS
625.0000 mg | ORAL_TABLET | Freq: Every day | ORAL | Status: DC
  Administered 2019-01-15: 625 mg via ORAL
  Filled 2019-01-15 (×4): qty 1

## 2019-01-15 MED ORDER — DIPHENOXYLATE-ATROPINE 2.5-0.025 MG PO TABS
1.0000 | ORAL_TABLET | Freq: Once | ORAL | Status: AC
  Administered 2019-01-15: 1 via ORAL
  Filled 2019-01-15: qty 1

## 2019-01-15 NOTE — Evaluation (Signed)
Occupational Therapy Assessment and Plan  Patient Details  Name: Madison Odom MRN: [id number] Date of Birth: 1934-10-06  OT Diagnosis: abnormal posture, disturbance of vision, hemiplegia affecting non-dominant side and muscle weakness (generalized) Rehab Potential: Rehab Potential (ACUTE ONLY): Good ELOS: 3-5 days   Today's Date: 01/15/2019 OT Individual Time: 1104-1200 OT Individual Time Calculation (min): 56 min     Problem List:  Patient Active Problem List   Diagnosis Date Noted  . Acute ischemic right MCA stroke ([place]) 01/14/2019  . Acute CVA (cerebrovascular accident) ([place]) 01/09/2019  . Epiretinal membrane, left eye 04/18/2017  . Acute blood loss anemia 09/27/2015  . Pelvic fracture ([place]) 09/21/2015  . Closed fracture of pelvis ([place])   . Hypertensive retinopathy of both eyes 08/16/2015  . Hypertension 01/28/2014  . Kyphoscoliosis 09/16/2013  . After cataract 02/19/2013  . Pseudophakia of both eyes 01/22/2013  . Posterior capsular opacification 10/29/2012  . Cystoid macular edema 04/30/2012  . Epiretinal membrane 04/30/2012  . MVP (mitral valve prolapse) 05/24/2011  . Hypercholesterolemia 05/24/2011    Past Medical History:  Past Medical History:  Diagnosis Date  . Anemia   . GERD (gastroesophageal reflux disease)   . Hyperlipidemia   . Hypertension   . Hypertensive retinopathy   . MVP (mitral valve prolapse)   . OP (osteoporosis)   . Pelvis fracture ([place]) 09/2015   CLOSED   . Scoliosis   . Spastic colon    Past Surgical History:  Past Surgical History:  Procedure Laterality Date  . ABDOMINAL HYSTERECTOMY  1965  . APPENDECTOMY    . BILATERAL OOPHORECTOMY  1981  . EYE SURGERY    . LOOP RECORDER INSERTION Left 01/13/2019   Procedure: LOOP RECORDER INSERTION;  Surgeon: Evans Lance, MD;  Location: [place] CV LAB;  Service: Cardiovascular;  Laterality: Left;  . ROTATOR CUFF REPAIR Right   . TEE WITHOUT CARDIOVERSION N/A 01/13/2019   Procedure:  TRANSESOPHAGEAL ECHOCARDIOGRAM (TEE);  Surgeon: Sanda Klein, MD;  Location: [facility] ENDOSCOPY;  Service: Cardiovascular;  Laterality: N/A;  loop    Assessment & Plan Clinical Impression: Patient is a 85 y.o. right handed female with history of HTN with retinopathy, GERD, scoliosis with gait disorder;who was admitted on02/27/2020 increasing difficulty walkingand found to haveright neglect acute infarct in right parietal lob,and atrophy with small vessel white matter disease. CTA head/neck done revealing right M2 occlusion and right parietal infarct without significant penumbra.MRI brain done revealing acute ischemic infarct in posterior right MCA territory affecting right parietal lobe. 2D echo showed EF of 60 to 65% with moderate basal septal hypertrophy,normal mitral valve trivial regurg and moderate thickening of aortic valve with mild regurg.  She was found to have iron deficiency anemia with hemoglobin of 8.8and started on iron supplement.EKG with question of A fib and stroke felt to be embolic due to unknown source. She was started on DAPTwith recommendations for to discontinue ASA after 3 weeks.TEEnegative for PFO or thrombus andLoop recorderplacedon 3/2. She has had some bleeding from site that has been controlled with pressure. Patient with resultant left neglect with poor safety awareness. CIR recommended due to functional decline.   Patient transferred to CIR on 01/14/2019 .    Patient currently requires mod with basic self-care skills secondary to muscle weakness, decreased cardiorespiratoy endurance, decreased coordination, decreased visual perceptual skills, decreased attention to left.  Prior to hospitalization, patient could complete ADLs with modified independent .  Patient will benefit from skilled intervention to increase independence with basic self-care skills and  increase level of independence with iADL prior to discharge home independently.  Anticipate patient  will require intermittent supervision and follow up home health.  OT - End of Session Activity Tolerance: Tolerates 30+ min activity without fatigue Endurance Deficit: No OT Assessment Rehab Potential (ACUTE ONLY): Good OT Barriers to Discharge: Decreased caregiver support OT Patient demonstrates impairments in the following area(s): Balance;Endurance;Motor;Safety;Vision OT Basic ADL's Functional Problem(s): Grooming;Bathing;Dressing;Toileting OT Advanced ADL's Functional Problem(s): Simple Meal Preparation;Laundry OT Transfers Functional Problem(s): Toilet;Tub/Shower OT Additional Impairment(s): Fuctional Use of Upper Extremity OT Plan OT Intensity: Minimum of 1-2 x/day, 45 to 90 minutes OT Frequency: 5 out of 7 days OT Duration/Estimated Length of Stay: 3-5 days OT Treatment/Interventions: Balance/vestibular training;Cognitive remediation/compensation;Discharge planning;Disease mangement/prevention;DME/adaptive equipment instruction;Functional mobility training;Neuromuscular re-education;Pain management;Patient/family education;Psychosocial support;Self Care/advanced ADL retraining;Skin care/wound managment;Therapeutic Activities;Therapeutic Exercise;UE/LE Strength taining/ROM;UE/LE Coordination activities;Visual/perceptual remediation/compensation OT Basic Self-Care Anticipated Outcome(s): Mod I OT Toileting Anticipated Outcome(s): Mod I OT Bathroom Transfers Anticipated Outcome(s): Mod I OT Recommendation Patient destination: Home Follow Up Recommendations: Home health OT   Skilled Therapeutic Intervention OT eval completed with discussion of rehab process, OT purpose, POC, ELOS, and goals.  ADL assessment completed with pt completing perineal hygiene on toilet and deferring additional bathing.  Pt completed dressing at sit > stand level from EOB.  Pt demonstrated difficulty with fastening bra due to mild Lt inattention and difficulty with sustained grasp strength in Lt hand.  Pt also  with difficulty fastening button up shirt due to Lt inattention and decreased grasp.  Pt reports feeling close to baseline with regards to functional mobility as she used Rollator at home and has personal Rollator here on CIR.  Pt required increased time during all transfers and mobility and demonstrated mild Lt inattention, bumping in to items x2 during mobility.  Pt left upright in w/c with seat belt alarm on and all needs in reach.  OT Evaluation Precautions/Restrictions  Precautions Precautions: Fall Precaution Comments: left inattention Restrictions Weight Bearing Restrictions: No Pain Pain Assessment Pain Scale: 0-10 Pain Score: 0-No pain Home Living/Prior Functioning Home Living Family/patient expects to be discharged to:: Private residence Living Arrangements: Alone Available Help at Discharge: Family(caregiver 4 hrs x2 days/week) Type of Home: House Home Access: Stairs to enter Technical brewer of Steps: 1(3") Entrance Stairs-Rails: None Home Layout: Two level, Other (Comment)(uses chair lift for past 4 years to access 2nd floor) Bathroom Shower/Tub: (walk in tub) Bathroom Toilet: Standard Additional Comments: has 2 4 wheeled walkers one upstairs and one downstairs  Lives With: Alone IADL History Homemaking Responsibilities: Yes Meal Prep Responsibility: Primary Laundry Responsibility: Primary Cleaning Responsibility: Secondary Bill Paying/Finance Responsibility: Primary Shopping Responsibility: (does the grocery order and pick up service) Prior Function Level of Independence: Independent with basic ADLs, Independent with transfers, Independent with homemaking with ambulation, Independent with gait, Requires assistive device for independence  Able to Take Stairs?: Yes(with assistance) Driving: No Comments: use of rollator for mobility; has been in OPPT as recent as of a couple days ago working on balance ADL ADL Grooming: Contact guard Where Assessed-Grooming:  Standing at sink Lower Body Bathing: Moderate assistance Where Assessed-Lower Body Bathing: Other (Comment)(sit > stand from toilet) Upper Body Dressing: Moderate assistance Where Assessed-Upper Body Dressing: Edge of bed Lower Body Dressing: Contact guard Where Assessed-Lower Body Dressing: Edge of bed Toileting: Minimal assistance Where Assessed-Toileting: Glass blower/designer: Therapist, music Method: Counselling psychologist: Bedside commode Vision Baseline Vision/History: Wears glasses Wears Glasses: At all times Patient Visual Report: No change from baseline  Vision Assessment?: Yes Eye Alignment: Within Functional Limits Ocular Range of Motion: Within Functional Limits Alignment/Gaze Preference: Within Defined Limits Cognition Overall Cognitive Status: Within Functional Limits for tasks assessed Arousal/Alertness: Awake/alert Orientation Level: Person;Place;Situation Person: Oriented Place: Oriented Situation: Oriented Year: 2020 Month: March Day of Week: Correct Memory: Appears intact Immediate Memory Recall: Sock;Blue;Bed Memory Recall: Blue;Sock;Bed Memory Recall Sock: Without Cue Memory Recall Blue: Without Cue Memory Recall Bed: Without Cue Attention: Selective Selective Attention: Appears intact Awareness: Impaired Awareness Impairment: Emergent impairment Problem Solving: Appears intact Safety/Judgment: Appears intact Sensation Sensation Light Touch: Appears Intact Proprioception: Appears Intact Additional Comments: denies numbness/tingling Coordination Fine Motor Movements are Fluid and Coordinated: Yes Motor  Motor Motor: Abnormal postural alignment and control Motor - Skilled Clinical Observations: generalized deconditioning Mobility  Bed Mobility Bed Mobility: Rolling Right;Rolling Left;Sit to Supine;Supine to Sit Rolling Right: Supervision/verbal cueing Rolling Left: Supervision/Verbal cueing Supine to Sit:  Supervision/Verbal cueing Sit to Supine: Supervision/Verbal cueing Transfers Sit to Stand: Supervision/Verbal cueing Stand to Sit: Supervision/Verbal cueing  Trunk/Postural Assessment  Cervical Assessment Cervical Assessment: (forward head) Thoracic Assessment Thoracic Assessment: (scolios, kyphosis) Lumbar Assessment Lumbar Assessment: (scoliosis) Postural Control Postural Control: Deficits on evaluation Righting Reactions: delayed Protective Responses: delayed  Balance Balance Balance Assessed: Yes Dynamic Standing Balance Dynamic Standing - Balance Support: During functional activity;Bilateral upper extremity supported(gait with rollator) Dynamic Standing - Level of Assistance: 5: Stand by assistance Extremity/Trunk Assessment RUE Assessment RUE Assessment: Within Functional Limits Active Range of Motion (AROM) Comments: shoulder flexion grossly 100* due to previous rotator cuff repair General Strength Comments: grossly 4/5 LUE Assessment LUE Assessment: Within Functional Limits Active Range of Motion (AROM) Comments: shoulder flexion grossly 120* General Strength Comments: grossly 4/5     Refer to Care Plan for Long Term Goals  Recommendations for other services: None    Discharge Criteria: Patient will be discharged from OT if patient refuses treatment 3 consecutive times without medical reason, if treatment goals not met, if there is a change in medical status, if patient makes no progress towards goals or if patient is discharged from hospital.  The above assessment, treatment plan, treatment alternatives and goals were discussed and mutually agreed upon: by patient  Aleyna Cueva, [facility], [place] 01/15/2019, 12:00 PM

## 2019-01-15 NOTE — Discharge Summary (Signed)
Physician Discharge Summary  Patient ID: Madison Odom MRN: [id number] DOB/AGE: [birth year] 83 y.o.  Admit date: 01/14/2019 Discharge date: 01/18/2019  Discharge Diagnoses:  Principal Problem:   Acute ischemic right MCA stroke [facility]) Active Problems:   Hypercholesterolemia   Kyphoscoliosis   Iron deficiency anemia   Discharged Condition: stable   Significant Diagnostic Studies: N/A   Labs:  Basic Metabolic Panel: Recent Labs  Lab 01/15/19 0534  NA 138  K 3.5  CL 104  CO2 25  GLUCOSE 86  BUN 13  CREATININE 0.49  CALCIUM 8.6*    CBC: CBC Latest Ref Rng & Units 01/15/2019 01/12/2019 01/10/2019  WBC 4.0 - 10.5 K/uL 6.6 8.0 9.5  Hemoglobin 12.0 - 15.0 g/dL 5.5(M) 0.8(Y) 2.2(V)  Hematocrit 36.0 - 46.0 % 31.2(L) 31.1(L) 29.7(L)  Platelets 150 - 400 K/uL 306 332 336    CBG: No results for input(s): GLUCAP in the last 168 hours.  Brief HPI:   Madison Odom is an 85 year old female with history of HTN with retinopathy, GERd, scoliosis with gait disorder who was admitted on 01/09/19 with increased difficulty walking and was found to have right neglect on evaluation in ED. MRI brain done revevealing acute ischemic infarct in posterior right MCA territory affecting right parietal lobe.  2D echo showed EF of 60 to 65% with moderate basal septal hypertrophy, normal mitral valve with trivial regurg and moderate thickening of aortic valve with mild regurg.    She was found to have iron deficiency anemia with hemoglobin of 8.8 and started on iron supplement.  Stroke felt to be embolic due to unknown source and loop recorder placed on 3/7.  TEE was negative for PFO or thrombus.  She started on DAPT with recommendations to discontinue aspirin after 3 weeks.  Patient with resultant left neglect with poor safety awareness impacting ADLs and mobility.  CIR was recommended due to functional decline   Hospital Course: Madison Odom was admitted to rehab 01/14/2019 for inpatient therapies to  consist of PT and OT at least three hours five days a week. Past admission physiatrist, therapy team and rehab RN have worked together to provide customized collaborative inpatient rehab.  She did continue to have some bloody drainage from loop placement site that resolved within 24 hours.  Follow-up CBC showed persistent iron deficiency anemia low-dose iron supplement was added.  She was unable to tolerate this due to GI distress and diarrhea therefore this was discontinued.  Patient advised to follow-up with primary care provider regarding alternative supplementation.  Blood pressures was monitored on twice daily basis and has been reasonably controlled.  P.o. intake has been good and she is continent of bowel and bladder.  Nutritional supplements were added for protein calorie malnutrition.  She has made good gains during her rehab stay and is at modified independent level at discharge.  Outpatient therapy recommended past discharge and she will need to follow-up with PCP for follow-up therapy orders past discharge.     Rehab course: During patient's stay in rehab team conference was held to discuss patient's progress, set goals as well as discuss barriers to discharge. At admission, patient required mod assist with basic ADL tasks and supervision with mobility.  She  has had improvement in activity tolerance, balance, postural control as well as ability to compensate for deficits.  She is able to complete ADL tasks at modified independent level.  She is modified independent for transfers and to ambulate 150' with rollator.  Disposition: Home  Diet: Heart healthy/carb modified  Special Instructions: 1.  Stop ASA after 02/01/19.   Discharge Instructions    Ambulatory referral to Physical Medicine Rehab   Complete by:  As directed    1-2 weeks transitional care appt     Allergies as of 01/18/2019      Reactions   Phenothiazines Other (See Comments), Anaphylaxis   Unknown reaction    Prochlorperazine Anaphylaxis   Benadryl [diphenhydramine]    Agitation , confusion    Ferrous Sulfate    Abdominal pain/diarrhea   Penicillins    Yeast infections Did it involve swelling of the face/tongue/throat, SOB, or low BP? No Did it involve sudden or severe rash/hives, skin peeling, or any reaction on the inside of your mouth or nose? No Did you need to seek medical attention at a hospital or doctor's office? No When did it last happen?30 yrs If all above answers are "NO", may proceed with cephalosporin use.   Tetracycline    Other reaction(s): Other (See Comments) Yeast infection      Medication List    STOP taking these medications   feeding supplement (ENSURE ENLIVE) Liqd   omeprazole 20 MG capsule Commonly known as:  PRILOSEC     TAKE these medications   acetaminophen 325 MG tablet Commonly known as:  TYLENOL Take 1-2 tablets (325-650 mg total) by mouth every 4 (four) hours as needed for mild pain.   aspirin 81 MG EC tablet Take 1 tablet (81 mg total) by mouth daily. Continue for 2 more weeks then stop. What changed:  additional instructions Notes to patient:  Stop taking Aspirin after 02/01/19    CALCIUM 500 +D PO Take 1 tablet by mouth daily.   clopidogrel 75 MG tablet Commonly known as:  PLAVIX Take 1 tablet (75 mg total) by mouth daily.   ferrous sulfate 325 (65 FE) MG tablet Take 1 tablet (325 mg total) by mouth daily at 12 noon. What changed:  when to take this   ketotifen 0.025 % ophthalmic solution Commonly known as:  ZADITOR Place 1 drop into both eyes 2 (two) times daily as needed (itching).   Melatonin 3 MG Tabs Take 2 tablets (6 mg total) by mouth at bedtime as needed (sleep). Notes to patient:  Available OTC   multivitamin tablet Take 1 tablet by mouth daily.   nadolol 40 MG tablet Commonly known as:  CORGARD Take 1 tablet (40 mg total) by mouth daily.   pantoprazole 40 MG tablet Commonly known as:  PROTONIX Take 1 tablet  (40 mg total) by mouth daily.   polycarbophil 625 MG tablet Commonly known as:  FIBERCON Take 1 tablet (625 mg total) by mouth daily.   simvastatin 40 MG tablet Commonly known as:  ZOCOR Take 1 tablet (40 mg total) by mouth daily at 6 PM.      Follow-up Information    [name], NP Follow up on 01/21/2019.   Specialty:  Family Medicine Why:  Appointment @ 10:30 AM Contact information: [address] [place] [address] [phone]        Erick Colace, MD Follow up.   Specialty:  Physical Medicine and Rehabilitation Why:  Office will call you with follow up appointment Contact information: [address] [place] [address] [phone]        [facility]. Call in 2 day(s).   Why:  call for follow up appointment Contact information: [address]  [address] [place] [address] [phone]          Signed: Jacquelynn Cree 01/19/2019, 11:01 PM

## 2019-01-15 NOTE — Progress Notes (Signed)
Social Work Assessment and Plan Social Work Assessment and Plan  Patient Details  Name: Madison Odom MRN: [id number] Date of Birth: Feb 26, 1934  Today's Date: 01/15/2019  Problem List:  Patient Active Problem List   Diagnosis Date Noted  . Acute ischemic right MCA stroke ([facility]) 01/14/2019  . Acute CVA (cerebrovascular accident) ([facility]) 01/09/2019  . Epiretinal membrane, left eye 04/18/2017  . Acute blood loss anemia 09/27/2015  . Pelvic fracture ([facility]) 09/21/2015  . Closed fracture of pelvis ([facility])   . Hypertensive retinopathy of both eyes 08/16/2015  . Hypertension 01/28/2014  . Kyphoscoliosis 09/16/2013  . After cataract 02/19/2013  . Pseudophakia of both eyes 01/22/2013  . Posterior capsular opacification 10/29/2012  . Cystoid macular edema 04/30/2012  . Epiretinal membrane 04/30/2012  . MVP (mitral valve prolapse) 05/24/2011  . Hypercholesterolemia 05/24/2011   Past Medical History:  Past Medical History:  Diagnosis Date  . Anemia   . GERD (gastroesophageal reflux disease)   . Hyperlipidemia   . Hypertension   . Hypertensive retinopathy   . MVP (mitral valve prolapse)   . OP (osteoporosis)   . Pelvis fracture ([facility]) 09/2015   CLOSED   . Scoliosis   . Spastic colon    Past Surgical History:  Past Surgical History:  Procedure Laterality Date  . ABDOMINAL HYSTERECTOMY  1965  . APPENDECTOMY    . BILATERAL OOPHORECTOMY  1981  . EYE SURGERY    . LOOP RECORDER INSERTION Left 01/13/2019   Procedure: LOOP RECORDER INSERTION;  Surgeon: Marinus Maw, MD;  [facility] INVASIVE CV LAB;  Service: Cardiovascular;  Laterality: Left;  . ROTATOR CUFF REPAIR Right   . TEE WITHOUT CARDIOVERSION N/A 01/13/2019   Procedure: TRANSESOPHAGEAL ECHOCARDIOGRAM (TEE);  Surgeon: Thurmon Fair, MD;  Location: [facility] ENDOSCOPY;  Service: Cardiovascular;  Laterality: N/A;  loop   Social History:  reports that she has never smoked. She has never used smokeless tobacco. She reports that she does not  drink alcohol or use drugs.  Family / Support Systems Marital Status: Widow/Widower Patient Roles: Parent, Other (Comment)(grandmother) Children: [name]-daughter [phone]  Liz-daughter [phone] Other Supports: private duty caregiver 2x week- 4hours per day Anticipated Caregiver: Daughter's, son in-law and private duty caregiver Ability/Limitations of Caregiver: Daughter's work Engineer, structural Availability: Intermittent Family Dynamics: Close with her two daughter's, both are very involved and will do what they can but work. She has a few friends and church members. Her caregiver from helping hands is supportive.  Social History Preferred language: English Religion: Methodist Cultural Background: No issues Education: [employer] Read: Yes Write: Yes Employment Status: Retired Marine scientist Issues: No issues Guardian/Conservator: None-according to MD pt is capable of making her own decisions while here   Abuse/Neglect Abuse/Neglect Assessment Can Be Completed: Yes Physical Abuse: Denies Verbal Abuse: Denies Sexual Abuse: Denies Exploitation of patient/patient's resources: Denies Self-Neglect: Denies  Emotional Status Pt's affect, behavior and adjustment status: Pt is motivated and is recovering from her stroke, she is almost back to her baseline functioning. She had a gait disorder and visual issues prior to admission. She has always been independent and has been able to adapt to her deficits. Recent Psychosocial Issues: other health issues were being managed by her PCP Psychiatric History: No history deferred depression screen due to doing well and adjusting to being in the hospital and deficits. She is encouraged by her progress and recovery with this CVA. Will be a short length of stay and will not be here long enough to be seen  by  neuro-psych  Substance Abuse History: No issues  Patient / Family Perceptions, Expectations & Goals Pt/Family understanding of  illness & functional limitations: Pt can explain her stroke and deficits she came into the hospital with. She does talk with the MD and feels she has a good understanding of her treatment plan going forward. She is glad to only need to be here a very short time. Premorbid pt/family roles/activities: Mom, grandmother, retiree, church member, etc Anticipated changes in roles/activities/participation: resume Pt/family expectations/goals: Pt states: " I hope to be back to where I was before this  episode happened, I seem to be doing well." Daughter states: " I want to make sure she is safe alone at home when she leaves here."  [employer]: Other (Comment)([facility]-active pt) Premorbid Home Care/DME Agencies: Other (Comment)(rollator, 3 in 1) Transportation available at discharge: Family members and caregiver Resource referrals recommended: Support group (specify)  Discharge Planning Living Arrangements: Alone Support Systems: Children, Friends/neighbors, Psychologist, clinical community, Home care staff(private duty 2 x week-4 hours) Type of Residence: Private residence Insurance Resources: Media planner (specify)(UHC_Medicare) Financial Resources: Restaurant manager, fast food Screen Referred: No Living Expenses: Own Money Management: Patient Does the patient have any problems obtaining your medications?: No Home Management: Caregiver and pt does her own cooking Patient/Family Preliminary Plans: Return home with intermittent assist from daughter's and caregiver. She is back to her baseline functioning and will be ready to go home by Sat, according to MD and therapy team. Pt wants to resume OPPT at [facility] Barriers to Discharge: Decreased caregiver support [place] Barriers to Discharge Comments: Does not have 24 hr care Social Work Anticipated Follow Up Needs: HH/OP, Support Group  Clinical Impression Pleasant female who is motivated to reach her baseline functioning  and go back home alone with her daughter's checking on her and caregiver 2 x week.Therapy team and MD feel she will be ready by Sat. Will work on discharge needs.  Lucy Chris 01/15/2019, 3:22 PM

## 2019-01-15 NOTE — Plan of Care (Signed)
  Problem: Consults Goal: RH STROKE PATIENT EDUCATION Description See Patient Education module for education specifics  Outcome: Progressing   Problem: RH SKIN INTEGRITY Goal: RH STG MAINTAIN SKIN INTEGRITY WITH ASSISTANCE Description STG Maintain Skin Integrity With min Assistance.  Outcome: Progressing   Problem: RH SAFETY Goal: RH STG ADHERE TO SAFETY PRECAUTIONS W/ASSISTANCE/DEVICE Description STG Adhere to Safety Precautions With min Assistance and appropriate assistive Device.  Outcome: Progressing   Problem: RH PAIN MANAGEMENT Goal: RH STG PAIN MANAGED AT OR BELOW PT'S PAIN GOAL Description <3 on a 0-10 pain scale  Outcome: Progressing   Problem: RH KNOWLEDGE DEFICIT Goal: RH STG INCREASE KNOWLEDGE OF STROKE PROPHYLAXIS Description Patient and family will be able to demonstrate knowledge of medications taken for stroke prevention with min assist from staff.  Outcome: Progressing

## 2019-01-15 NOTE — Progress Notes (Addendum)
[place] PHYSICAL MEDICINE & REHABILITATION PROGRESS NOTE   Subjective/Complaints:  No issues overnite Discussed poor appetite, even at home, like peach Boost ROS Neg for CP, SOB, N/V/D, had freq stool after laxative yesterday but now back to normal Objective:   No results found. Recent Labs    01/15/19 0534  WBC 6.6  HGB 8.9*  HCT 31.2*  PLT 306   Recent Labs    01/15/19 0534  NA 138  K 3.5  CL 104  CO2 25  GLUCOSE 86  BUN 13  CREATININE 0.49  CALCIUM 8.6*   No intake or output data in the 24 hours ending 01/15/19 0804   Physical Exam: Vital Signs Blood pressure 132/63, pulse (!) 52, temperature 98.5 F (36.9 C), temperature source Oral, resp. rate 18, height 4\' 10"  (1.473 m), weight 37.5 kg, SpO2 94 %.   General: No acute distress Mood and affect are appropriate Heart: Regular rate and rhythm no rubs murmurs or extra sounds Lungs: Clear to auscultation, breathing unlabored, no rales or wheezes Abdomen: Positive bowel sounds, soft nontender to palpation, nondistended Extremities: No clubbing, cyanosis, or edema Skin: No evidence of breakdown, no evidence of rash Neurologic: Cranial nerves II through XII intact, motor strength is 4/5 in bilateral deltoid, bicep, tricep, grip, hip flexor, knee extensors, ankle dorsiflexor and plantar flexor Left neglect on confrontation Sensory exam normal sensation to light touch in bilateral lower extremities Cerebellar exam normal finger to nose to finger as well as heel to shin in bilateral upper and lower extremities Musculoskeletal: Full range of motion in all 4 extremities. No joint swelling   Assessment/Plan: 1. Functional deficits secondary to Right MCA infarct with Left neglect which require 3+ hours per day of interdisciplinary therapy in a comprehensive inpatient rehab setting.  Physiatrist is providing close team supervision and 24 hour management of active medical problems listed below.  Physiatrist and rehab  team continue to assess barriers to discharge/monitor patient progress toward functional and medical goals  Care Tool:  Bathing              Bathing assist       Upper Body Dressing/Undressing Upper body dressing   What is the patient wearing?: Hospital gown only    Upper body assist      Lower Body Dressing/Undressing Lower body dressing      What is the patient wearing?: Hospital gown only     Lower body assist       Toileting Toileting    Toileting assist Assist for toileting: Contact Guard/Touching assist     Transfers Chair/bed transfer  Transfers assist     Chair/bed transfer assist level: Minimal Assistance - Patient > 75%     Locomotion Ambulation   Ambulation assist              Walk 10 feet activity   Assist           Walk 50 feet activity   Assist           Walk 150 feet activity   Assist           Walk 10 feet on uneven surface  activity   Assist           Wheelchair     Assist               Wheelchair 50 feet with 2 turns activity    Assist  Wheelchair 150 feet activity     Assist          Medical Problem List and Plan: 1.Functional and mobility deficitssecondary to right MCA infarct CIR PT, OT, SLP evals today 2. Antithrombotics: -DVT/anticoagulation:Pharmaceutical:Lovenox -antiplatelet therapy: ASA/Plavix X 3 weeks followed by Plavix alone 3. Pain Management:tylenol prn 4. Mood:LCSW to follow for evaluation and support. -antipsychotic agents: n/a 5. Neuropsych: This patientiscapable of making decisions on herown behalf. 6. Skin/Wound Care:Left loop incision continues to ooze---change dressing left chest wall as needed with application of pressure relief measures as well as ice prn.Routine pressure relief measures. 7. Fluids/Electrolytes/Nutrition:Monitor I/O. Check lytes in am. 8.HTN: Monitor  blood pressures twice daily. Vitals:   01/14/19 2014 01/15/19 0439  BP: 131/70 132/63  Pulse: (!) 57 (!) 52  Resp: 18 18  Temp: (!) 97.5 F (36.4 C) 98.5 F (36.9 C)  SpO2: 96% 94%   9.Iron deficiency anemia: On iron supplement. 10.Prediabetes: Hemoglobin A1c 5.8. Add CM restrictions and educate patient on diet. 11.  Protein malnutrition   LOS: 1 days A FACE TO FACE EVALUATION WAS PERFORMED  Erick Colace 01/15/2019, 8:04 AM

## 2019-01-15 NOTE — Progress Notes (Signed)
Per nursing, patient was given "Data Collection Information Summary for Patients in Inpatient Rehabilitation Facilities with attached Privacy Act Statement Health Care Records" upon admission.    Patient information reviewed and entered into [facility] by Becky Taunia Frasco, PPS coordinator. Information including medical coding, function ability, and quality indicators will be reviewed and updated through discharge.   

## 2019-01-15 NOTE — Patient Care Conference (Signed)
Inpatient RehabilitationTeam Conference and Plan of Care Update Date: 01/15/2019   Time: 10:50 Am    Patient Name: Madison Odom      Medical Record Number: [id number]  Date of Birth: October 02, 1934 Sex: Female         Room/Bed: 4W16C/4W16C-01 Payor Info: Payor: [employer]vertising copywriter MEDICARE / Plan: [employer] MEDICARE / Product Type: *No Product type* /    Admitting Diagnosis: CVA  Admit Date/Time:  01/14/2019  3:47 PM Admission Comments: No comment available   Primary Diagnosis:  <principal problem not specified> Principal Problem: <principal problem not specified>  Patient Active Problem List   Diagnosis Date Noted  . Acute ischemic right MCA stroke (HCC) 01/14/2019  . Acute CVA (cerebrovascular accident) (HCC) 01/09/2019  . Epiretinal membrane, left eye 04/18/2017  . Acute blood loss anemia 09/27/2015  . Pelvic fracture (HCC) 09/21/2015  . Closed fracture of pelvis (HCC)   . Hypertensive retinopathy of both eyes 08/16/2015  . Hypertension 01/28/2014  . Kyphoscoliosis 09/16/2013  . After cataract 02/19/2013  . Pseudophakia of both eyes 01/22/2013  . Posterior capsular opacification 10/29/2012  . Cystoid macular edema 04/30/2012  . Epiretinal membrane 04/30/2012  . MVP (mitral valve prolapse) 05/24/2011  . Hypercholesterolemia 05/24/2011    Expected Discharge Date: Expected Discharge Date: 01/18/19  Team Members Present: Physician leading conference: Dr. Claudette Laws Social Worker Present: Dossie Der, LCSW Nurse Present: Ronny Bacon, RN PT Present: Aleda Grana, PT OT Present: Rosalio Loud, OT SLP Present: Reuel Derby, SLP PPS Coordinator present : Fae Pippin     Current Status/Progress Goal Weekly Team Focus  Medical   Patient requiring supervision at home, poor nutritional status, blood pressure stabilized,  Reduce readmission risk reduce fall risk  Initiate rehabilitation program assess functional disabilities and home environment   Bowel/Bladder   Continent  of b/b; regular bowel patteren LBM: 03/03  remain continent of b/b; maintain regular bowel pattern  assist with tolieting need prn    Swallow/Nutrition/ Hydration             ADL's   eval pending         Mobility   supervision overall with rollator  mod I household mobility, min assist 2 steps without rails for access to daughters house  balance, pt education & d/c planning, stair negotiation   Communication             Safety/Cognition/ Behavioral Observations            Pain   no c/o pain; prn tylenol  remain pain free   assess q shift and prn    Skin   Incision from placement of loop recorder; skin tear on left arm  remain free of new skin infection/breakdown  assess q shift and prn       *See Care Plan and progress notes for long and short-term goals.     Barriers to Discharge  Current Status/Progress Possible Resolutions Date Resolved   Physician    Medical stability     Initial eval's in progress  Based on PT eval will likely be short length of stay given that the patient is close to her premorbid status      Nursing  Medication compliance;Decreased caregiver support;Lack of/limited family support;Home environment access/layout               PT  (pt lives alone)                 OT Decreased caregiver support  SLP                SW                Discharge Planning/Teaching Needs:    Home with intermittent assist from daughter's and hired private duty caregiver 2 x week.     Team Discussion:  New patient goals mod/i level with rollator. L-neglect pt reports has before and had balance issues. Working on po intake. No speech issues. Ready for DC Sat. Medically doing well and will be ready on Sat.  Revisions to Treatment Plan:  DC 3/7    Continued Need for Acute Rehabilitation Level of Care: The patient requires daily medical management by a physician with specialized training in physical medicine and rehabilitation for the following  conditions: Daily direction of a multidisciplinary physical rehabilitation program to ensure safe treatment while eliciting the highest outcome that is of practical value to the patient.: Yes Daily medical management of patient stability for increased activity during participation in an intensive rehabilitation regime.: Yes Daily analysis of laboratory values and/or radiology reports with any subsequent need for medication adjustment of medical intervention for : Neurological problems   I attest that I was present, lead the team conference, and concur with the assessment and plan of the team.   Lucy Chris 01/15/2019, 1:47 PM

## 2019-01-15 NOTE — Progress Notes (Signed)
Occupational Therapy Session Note  Patient Details  Name: Madison Odom MRN: [id number] Date of Birth: 04-May-1934  Today's Date: 01/15/2019 OT Individual Time: 1400-1515 OT Individual Time Calculation (min): 75 min    Short Term Goals: Week 1:  OT Short Term Goal 1 (Week 1): STG = LTGs due to ELOS  Skilled Therapeutic Interventions/Progress Updates:  1:1. Pt completes ambulation throughout session with rolator and min VC for management when stand>sit as well as locking brakes as pt forgets to lock/unlock with LUE ~25% of time. Pt completes dynavision activity demoing .5second slower location of stimulus in L half and .25sec slower rxn time with LUE v RUE. UE assessments as follows:  Box and blocks:   RUE 37 LUE 24  9HPT:  RUE 34.5 sec LUE 49.3 sec  Pt completes BM/toileting 2x throughout session with S. Pt completes kitchen search activity with S and VC for safety awareness as pt stepping around rolator to reach into cabinets/pantry. Exited session with pt seated in bed, call light in reach and all needs met Therapy Documentation Precautions:  Precautions Precautions: Fall Precaution Comments: left inattention Restrictions Weight Bearing Restrictions: No   Therapy/Group: Individual Therapy  Tonny Branch 01/15/2019, 2:40 PM

## 2019-01-15 NOTE — Progress Notes (Signed)
Initial Nutrition Assessment  DOCUMENTATION CODES:   Underweight  INTERVENTION:  - Continue Boost Breeze po TID, each supplement provides 250 kcal and 9 grams of protein - Magic cup BID with meals, each supplement provides 290 kcal and 9 grams of protein - Will order snacks between meals  NUTRITION DIAGNOSIS:   Increased nutrient needs related to acute illness(infarct) as evidenced by estimated needs.  GOAL:   Patient will meet greater than or equal to 90% of their needs   MONITOR:   PO intake, Supplement acceptance, Weight trends  REASON FOR ASSESSMENT:   Other (Comment)(Low BMI)    ASSESSMENT:   85 year old female with PMH of HLD, GERD, and HTN. Admitted with acute/subacute infarct of right parietal lobe. TEE completed and Loop Recorder placed on 01/13/2019.    Spoke with patient who was sitting in her wheelchair bedside the bed. Pt was very pleasant and in good spirits.  Pt reports that PTA she has 3 meals a day and has a good appetite. Breakfast is often cereal, maybe eggs and water. Lunch will be a deli sandwich. Dinner is often meat (chicken, fish, pork) with vegetables or salad. Pt reports that she enjoys snacking on chips throughout the day. She does live at home alone and does all her cooking, but she has no issues completing ADL.  Educated pt about rinsing canned vegetables, choosing fresh or frozen if able and to limit the amount of salt added to meals. Pt also reports she eats low sodium frozen meals.    Currently her appetite is good but her intake is poor. She contributes that to not being able to enjoy the food. Pt reports that she does not eat any more than 50% of her meals because she thinks the food tastes bad. She does enjoy the [employer] and tries to drink the 3 a day. Pt does not want Ensure and wishes to continue with the Boost Breeze.  Encouraged pt to try to have good PO intake and to order foods she enjoys. Pt does have some degree of lactose  intolerance so she avoids yogurts, some cheese and will drink milk but deal with the GI intolerances. Educated pt regarding lactose free options. Encouraged overall healthy PO intake.    She is normally around 80#, so her current wt is not abnormal for her. NFPE revealed severe and moderate depletions however, pt reported that she has always been smaller and has not noticed any kind of wt loss. Per chart, pt has consistently stayed around 80-90#s for the last 5 years. She said about 50 years ago she was once weighing around 115#. Unable to determine if pt is severely malnourished d/t advanced aging or if it is related to inadequate intake over a long period of time.   Will continue to monitor PO intake. Recommend continuing BB TID, and beginning MC BID to see if she can tolerate that. Will order snacks between meals.   Medications reviewed and include: Boost Breeze TID, Ferrous sulfate, MVI Labs reviewed: CBG (56, 148, 98), Hgb A1C 5.8  NUTRITION - FOCUSED PHYSICAL EXAM:    Most Recent Value  Orbital Region  No depletion  Upper Arm Region  Severe depletion  Thoracic and Lumbar Region  Moderate depletion  Buccal Region  Moderate depletion  Temple Region  No depletion  Clavicle Bone Region  Severe depletion  Clavicle and Acromion Bone Region  Severe depletion  Scapular Bone Region  Severe depletion  Dorsal Hand  Severe depletion  Patellar Region  Severe depletion  Anterior Thigh Region  Moderate depletion  Posterior Calf Region  Mild depletion  Edema (RD Assessment)  None  Hair  Reviewed  Eyes  Reviewed  Mouth  Reviewed  Skin  Reviewed  Nails  Reviewed      Diet Order:   Diet Order            Diet heart healthy/carb modified Room service appropriate? Yes; Fluid consistency: Thin  Diet effective now              EDUCATION NEEDS:   Education needs have been addressed  Skin:  Skin Assessment: Skin Integrity Issues: Skin Integrity Issues:: Incisions Incisions: Closed; left  side chest  Last BM:  3/4  Height:   Ht Readings from Last 1 Encounters:  01/14/19 4\' 10"  (1.473 m)    Weight:   Wt Readings from Last 1 Encounters:  01/14/19 37.5 kg    Ideal Body Weight:  43.9 kg  BMI:  Body mass index is 17.28 kg/m.  Estimated Nutritional Needs:   Kcal:  1200-1500 kcal  Protein:  60-75 g  Fluid:  >/= 1.2L    [employer] Dietetic Intern

## 2019-01-15 NOTE — Progress Notes (Signed)
Patient reported that she did not take iron at home as it upset her stomach. Has had diarrhea. Will add fiber and decreased iron to Niferex 150 mg daily. Monitor for further GI symptoms.

## 2019-01-15 NOTE — Care Management Note (Signed)
Inpatient Rehabilitation Center Individual Statement of Services  Patient Name:  Madison Odom  Date:  01/15/2019  Welcome to the Inpatient Rehabilitation Center.  Our goal is to provide you with an individualized program based on your diagnosis and situation, designed to meet your specific needs.  With this comprehensive rehabilitation program, you will be expected to participate in at least 3 hours of rehabilitation therapies Monday-Friday, with modified therapy programming on the weekends.  Your rehabilitation program will include the following services:  Physical Therapy (PT), Occupational Therapy (OT), 24 hour per day rehabilitation nursing, Case Management (Social Worker), Rehabilitation Medicine, Nutrition Services and Pharmacy Services  Weekly team conferences will be held on Wednesday to discuss your progress.  Your Social Worker will talk with you frequently to get your input and to update you on team discussions.  Team conferences with you and your family in attendance may also be held.  Expected length of stay: 3-5 days  Overall anticipated outcome: mod/i-supervision level  Depending on your progress and recovery, your program may change. Your Social Worker will coordinate services and will keep you informed of any changes. Your Social Worker's name and contact numbers are listed  below.  The following services may also be recommended but are not provided by the Inpatient Rehabilitation Center:    Home Health Rehabiltiation Services  Outpatient Rehabilitation Services    Arrangements will be made to provide these services after discharge if needed.  Arrangements include referral to agencies that provide these services.  Your insurance has been verified to be:  [employer]HC-Medicare Your primary doctor is:  [facility]  Pertinent information will be shared with your doctor and your insurance company.  Social Worker:  Dossie Der, SW [phone] or [phone]  Information  discussed with and copy given to patient by: Lucy Chris, 01/15/2019, 2:55 PM

## 2019-01-15 NOTE — Evaluation (Signed)
Physical Therapy Assessment and Plan  Patient Details  Name: Madison Odom MRN: [id number] Date of Birth: 06-28-1934  PT Diagnosis: Abnormal posture, Abnormality of gait, Hemiparesis (mild LUE) and Muscle weakness Rehab Potential: Excellent ELOS: 3-5 days   Today's Date: 01/15/2019 PT Individual Time: 7619-5093 PT Individual Time Calculation (min): 57 min    Problem List:  Patient Active Problem List   Diagnosis Date Noted  . Acute ischemic right MCA stroke ([place]) 01/14/2019  . Acute CVA (cerebrovascular accident) ([place]) 01/09/2019  . Epiretinal membrane, left eye 04/18/2017  . Acute blood loss anemia 09/27/2015  . Pelvic fracture ([place]) 09/21/2015  . Closed fracture of pelvis ([place])   . Hypertensive retinopathy of both eyes 08/16/2015  . Hypertension 01/28/2014  . Kyphoscoliosis 09/16/2013  . After cataract 02/19/2013  . Pseudophakia of both eyes 01/22/2013  . Posterior capsular opacification 10/29/2012  . Cystoid macular edema 04/30/2012  . Epiretinal membrane 04/30/2012  . MVP (mitral valve prolapse) 05/24/2011  . Hypercholesterolemia 05/24/2011    Past Medical History:  Past Medical History:  Diagnosis Date  . Anemia   . GERD (gastroesophageal reflux disease)   . Hyperlipidemia   . Hypertension   . Hypertensive retinopathy   . MVP (mitral valve prolapse)   . OP (osteoporosis)   . Pelvis fracture ([place]) 09/2015   CLOSED   . Scoliosis   . Spastic colon    Past Surgical History:  Past Surgical History:  Procedure Laterality Date  . ABDOMINAL HYSTERECTOMY  1965  . APPENDECTOMY    . BILATERAL OOPHORECTOMY  1981  . EYE SURGERY    . LOOP RECORDER INSERTION Left 01/13/2019   Procedure: LOOP RECORDER INSERTION;  Surgeon: Evans Lance, MD;  Location: [place] [facility];  Service: Cardiovascular;  Laterality: Left;  . ROTATOR CUFF REPAIR Right   . TEE WITHOUT CARDIOVERSION N/A 01/13/2019   Procedure: TRANSESOPHAGEAL ECHOCARDIOGRAM (TEE);  Surgeon: Sanda Klein, MD;   Location: [facility] ENDOSCOPY;  Service: Cardiovascular;  Laterality: N/A;  loop    Assessment & Plan Clinical Impression: Patient is a 85 y.o. year old female with history of HTN with retinopathy, GERD, scoliosis with gait disorder;who was admitted on02/27/2020 increasing difficulty walkingand found to haveright neglect acute infarct in right parietal lob,and atrophy with small vessel white matter disease. CTA head/neck done revealing right M2 occlusion and right parietal infarct without significant penumbra.MRI brain done revealing acute ischemic infarct in posterior right MCA territory affecting right parietal lobe. 2D echo showed EF of 60 to 65% with moderate basal septal hypertrophy,normal mitral valve trivial regurg and moderate thickening of aortic valve with mild regurg.  She was found to have iron deficiency anemia with hemoglobin of 8.8and started on iron supplement.EKG with question of A fib and stroke felt to be embolic due to unknown source. She was started on DAPTwith recommendations for to discontinue ASA after 3 weeks.TEEnegative for PFO or thrombus andLoop recorderplacedon 3/2. She has had some bleeding from site that has been controlled with pressure. Patient with resultant left neglect with poor safety awareness. CIR recommended due to functional decline. Patient transferred to CIR on 01/14/2019 .   Patient currently requires supervision with mobility secondary to muscle weakness, decreased cardiorespiratoy endurance, decreased visual perceptual skills, decreased attention to left, and decreased standing balance, decreased postural control and decreased balance strategies.  Prior to hospitalization, patient was modified independent  with mobility and lived with Alone in a House home.  Home access is 1(3")Stairs to enter.  Patient will benefit  from skilled PT intervention to maximize safe functional mobility, minimize fall risk and decrease caregiver burden for planned  discharge home with intermittent assist.  Anticipate patient will benefit from follow up [facility] at discharge.  PT - End of Session Activity Tolerance: Tolerates 30+ min activity without fatigue Endurance Deficit: No PT Assessment Rehab Potential (ACUTE/IP ONLY): Excellent PT Barriers to Discharge: (pt lives alone) PT Patient demonstrates impairments in the following area(s): Balance;Motor;Perception PT Transfers Functional Problem(s): Bed Mobility;Bed to Chair;Furniture PT Locomotion Functional Problem(s): Ambulation;Stairs PT Plan PT Intensity: Minimum of 1-2 x/day ,45 to 90 minutes PT Frequency: 5 out of 7 days PT Duration Estimated Length of Stay: 3-5 days PT Treatment/Interventions: Ambulation/gait training;Discharge planning;DME/adaptive equipment instruction;Functional mobility training;Psychosocial support;Visual/perceptual remediation/compensation;Therapeutic Exercise;Stair training;Patient/family education;Neuromuscular re-education;Functional electrical stimulation;Disease management/prevention;Community reintegration;Balance/vestibular training;Pain management;Therapeutic Activities;UE/LE Strength taining/ROM;UE/LE Coordination activities PT Transfers Anticipated Outcome(s): mod I  PT Locomotion Anticipated Outcome(s): mod I with LRAD for household mobility PT Recommendation Recommendations for Other Services: Neuropsych consult;Therapeutic Recreation consult Therapeutic Recreation Interventions: Stress management;Pet therapy;Kitchen group Follow Up Recommendations: Home health PT Patient destination: Home Equipment Recommended: None recommended by PT Equipment Details: pt already has rollator  Skilled Therapeutic Intervention Patient received in bed & agreeable to tx. Educated pt on daily therapy schedule, weekly team meetings, ELOS, and other CIR information. Also briefly educated pt on physiology of stroke, purpose of therapy, and stroke recovery. Pt completes bed mobility  without bed features (bed flat, no rails) with supervision, transfers with supervision, ambulates with rollator with supervision, car transfer with supervision with education to sit then transfer BLE in/out, negotiates ramp & mulch with rollator and supervision. Pt does require assistance to place rollator up/down curb but otherwise no physical assistance & pt reports this is baseline. Pt reports she only has to negotiate ~2 steps without rails at her daughters house and her family assists her with this task. Pt negotiates 12 steps (6" + 3") with L<>B rails with min assist. Pt attempts to propel w/c with BUE but requires max cuing & assist 2/2 decreased ability to use LUE. Pt retrieves object from floor with supervision and completes transfer to recliner with supervision. At end of session pt left sitting in w/c with call bell in reach. Notified RN of need to get alarm box for pt's chair alarm.   PT Evaluation Precautions/Restrictions Precautions Precautions: Fall Restrictions Weight Bearing Restrictions: No  General Chart Reviewed: Yes Additional Pertinent History: HLD, MVP, OP, hx of pelvic fx 2016, scoliosis Response to Previous Treatment: Patient with no complaints from previous session. Family/Caregiver Present: No   Pain Pain Assessment Pain Scale: 0-10 Pain Score: 0-No pain  Home Living/Prior Functioning Home Living Available Help at Discharge: Family(caregiver 4 hours x 2 days/week) Type of Home: House Home Access: Stairs to enter [employer] of Steps: 1(3") Entrance Stairs-Rails: None Home Layout: Two level;Other (Comment)(uses chair lift for past 4 years to access 2nd floor)  Lives With: Alone Prior Function Level of Independence: Independent with basic ADLs;Independent with transfers;Independent with homemaking with ambulation;Independent with gait;Requires assistive device for independence(rollator)  Able to Take Stairs?: Yes(with assistance) Driving:  No Comments: use of rollator for mobility; has been in OPPT as recent as of a couple days ago working on balance  Vision/Perception  Pt reports wearing glasses at all times at baseline. Pt denies any changes in vision. Slight L inattention.   Cognition Overall Cognitive Status: Within Functional Limits for tasks assessed Arousal/Alertness: Awake/alert Orientation Level: Oriented X4  Sensation Sensation Light Touch: Appears Intact  Proprioception: Appears Intact Additional Comments: denies numbness/tingling Coordination Fine Motor Movements are Fluid and Coordinated: Yes  Motor  Motor Motor: Abnormal postural alignment and control Motor - Skilled Clinical Observations: generalized deconditioning   Mobility Bed Mobility Bed Mobility: Rolling Right;Rolling Left;Sit to Supine;Supine to Sit Rolling Right: Supervision/verbal cueing Rolling Left: Supervision/Verbal cueing Supine to Sit: Supervision/Verbal cueing Sit to Supine: Supervision/Verbal cueing Transfers Transfers: Sit to Stand;Stand to Sit;Stand Pivot Transfers Sit to Stand: Supervision/Verbal cueing Stand to Sit: Supervision/Verbal cueing  Locomotion  Gait Ambulation: Yes Gait Assistance: Supervision/Verbal cueing Gait Distance (Feet): 150 Feet Assistive device: (rollator) Gait Gait velocity: decreased Stairs / Additional Locomotion Stairs: Yes Stairs Assistance: Minimal Assistance - Patient > 75% Stair Management Technique: Two rails;One rail Left Number of Stairs: 12 Height of Stairs: (6" + 3") Ramp: Supervision/Verbal cueing(ambulatory with rollator) Curb: Minimal Assistance - Patient >75%(assistance for managing rollator up/down) Wheelchair Mobility Wheelchair Mobility: Yes   Trunk/Postural Assessment  Cervical Assessment Cervical Assessment: (forward head) Thoracic Assessment Thoracic Assessment: (scolios, kyphosis) Lumbar Assessment Lumbar Assessment: (scoliosis) Postural Control Postural  Control: Deficits on evaluation Righting Reactions: delayed Protective Responses: delayed   Balance Balance Balance Assessed: Yes Dynamic Standing Balance Dynamic Standing - Balance Support: During functional activity;Bilateral upper extremity supported(gait with rollator) Dynamic Standing - Level of Assistance: 5: Stand by assistance   Extremity Assessment  All UE WFL   Refer to Care Plan for Long Term Goals  Recommendations for other services: Neuropsych and Therapeutic Recreation  Pet therapy, Kitchen group and Stress management  Discharge Criteria: Patient will be discharged from PT if patient refuses treatment 3 consecutive times without medical reason, if treatment goals not met, if there is a change in medical status, if patient makes no progress towards goals or if patient is discharged from hospital.  The above assessment, treatment plan, treatment alternatives and goals were discussed and mutually agreed upon: by patient  Waunita Schooner 01/15/2019, 12:22 PM

## 2019-01-16 ENCOUNTER — Inpatient Hospital Stay (HOSPITAL_COMMUNITY): Payer: Medicare Other | Admitting: Occupational Therapy

## 2019-01-16 ENCOUNTER — Inpatient Hospital Stay (HOSPITAL_COMMUNITY): Payer: Medicare Other | Admitting: Physical Therapy

## 2019-01-16 DIAGNOSIS — K219 Gastro-esophageal reflux disease without esophagitis: Secondary | ICD-10-CM

## 2019-01-16 MED ORDER — CALCIUM CARBONATE ANTACID 500 MG PO CHEW
1.0000 | CHEWABLE_TABLET | Freq: Four times a day (QID) | ORAL | Status: DC | PRN
  Administered 2019-01-16 – 2019-01-17 (×3): 200 mg via ORAL
  Filled 2019-01-16 (×3): qty 1

## 2019-01-16 NOTE — Progress Notes (Signed)
Social Work Patient ID: Madison Odom, female   DOB: [birth year], 85 y.o.   MRN: [id number] Daughter concerned pt is going home too soon and wants to be sure she is mod/i before going home. Have informed her the therapy team is challenging her today and taking her into the kitchen due to she prepares her meals. Discussed having home health and then transitioning back to OP. She will need to see her PCP to resume orders on OP due to he made the referral to being with. Will work toward discharge Sat.

## 2019-01-16 NOTE — Progress Notes (Signed)
Occupational Therapy Session Note  Patient Details  Name: Madison Odom MRN: [id number] Date of Birth: [birth year]  Today's Date: 01/16/2019 OT Individual Time: 1350-1450 OT Individual Time Calculation (min): 60 min    Short Term Goals: Week 1:  OT Short Term Goal 1 (Week 1): STG = LTGs due to ELOS  Skilled Therapeutic Interventions/Progress Updates:    Treatment session with focus on functional mobility, activity tolerance, and dynamic balance during home making tasks. Pt received upright in recliner finishing lunch.  Pt ambulated 150' with Rollator without assistance to ADL apt.  Completed transfers in/out of recliner with close supervision, Mod I overall with other transfers and mobility.  Engaged in laundry task in standing as pt reports she typically folds towels and clothes standing at dryer and then transports small amounts of items on her Rollator.  Pt stood to fold and then ambulated to dresser to place items in dresser drawers at [employer] I level.  Engaged in LUE NMR with focus on grasp and pinch strength to increase success with fastening bra.  Utilized resistive clothespins with pt requiring increased time with increased resistance but able to complete.  Returned to room ambulating with Rollator at [employer] I.  Pt reports need to toilet, completed toileting task and transfer Mod I with Rollator.  Pt left upright in recliner with all needs in reach.  Engaged in discussion about pt goals and current status with pt stating that she feels she is at baseline. Discussed caregiver who assists with heavier housekeeping tasks such as changing sheets on bed and vacuuming.  Pt reports that she does cook items in microwave and occasionally on the stove top.  Discussed family concerns with recommendation for possible supervision initially upon d/c with pt unsure that either daughter could stay even over night.  This therapist continues to recommend HHOT initially upon d/c to further address home bathroom transfers  (walk-in tub) and IADLs.    Therapy Documentation Precautions:  Precautions Precautions: Fall Precaution Comments: left inattention Restrictions Weight Bearing Restrictions: No General:   Vital Signs: Therapy Vitals Temp: 97.7 F (36.5 C) Temp Source: Oral Pulse Rate: (!) 56 Resp: 18 BP: 117/77 Patient Position (if appropriate): Sitting Oxygen Therapy SpO2: 96 % O2 Device: Room Air Pain: Pain Assessment Pain Scale: 0-10 Pain Score: 0-No pain   Therapy/Group: Individual Therapy  Rosalio Loud 01/16/2019, 3:49 PM

## 2019-01-16 NOTE — Progress Notes (Signed)
Physical Therapy Session Note  Patient Details  Name: Madison Odom MRN: [id number] Date of Birth: 1934-02-08  Today's Date: 01/16/2019 PT Individual Time:  -      Short Term Goals: Week 1:  PT Short Term Goal 1 (Week 1): STG = LTG due to short ELOS.  Skilled Therapeutic Interventions/Progress Updates:    Pt received in chair, willing to participate in therapy despite meal being delivered. Ambulation with rollator with supervision for safety approximately 75 ft.  Static balance on foam pad with reaching outside base of support to press buttons on [employer]. 2 min with feet shoulder width, no LOB, CGA for safety. 2 min with feet together on foam pad; one minor LOB with minA to correct.  Ambulation back to room approximately 75 ft with supervision. Dynamic balance: heel-toe walking 25 ft with HHA. No LOB, pt safe and steady even with challenging balance tasks. Pt left in room with needs met, call bell in reach.  Therapy Documentation Precautions:  Precautions Precautions: Fall Precaution Comments: left inattention Restrictions Weight Bearing Restrictions: No Pain: Pain Assessment Pain Scale: 0-10 Pain Score: 0-No pain    Therapy/Group: Individual Therapy   Ronnell Guadalajara, SPT   Ronnell Guadalajara 01/16/2019, 1:36 PM

## 2019-01-16 NOTE — Progress Notes (Signed)
Occupational Therapy Session Note  Patient Details  Name: Madison Odom MRN: [id number] Date of Birth: September 19, 1934  Today's Date: 01/16/2019 OT Individual Time: 3810-1751 OT Individual Time Calculation (min): 55 min    Short Term Goals: Week 1:  OT Short Term Goal 1 (Week 1): STG = LTGs due to ELOS  Skilled Therapeutic Interventions/Progress Updates:    Pt seen for OT session focusing on ADL re-training and functional mobility/endurance and home accessibility. Pt asleep upon arrival, easily awoken and agreeable to tx session, denying pain. She transitioned to sitting EOB with supervision using hospital bed functions. She denied need for bathing session this morning, opting to just change clothes. She ambulated throughout room using rollator with distant supervision. VCs for rollator management and safety when reaching ot obtain items from overhead closet. She dressed seated EOB with supervision, VCs for safety when pt attempting to stand with pantyhose only on feet. She demonstrated good problem solving skills when difficulty locating arm hole on pull around shirt ultimately able to complete independently with increased time. She ambulated to sink and completed grooming tasks standing at rollator with distant supervision. Ambulated into bathroom and completed toileting task mod I. Pt taken to ADL apartment total A in w/c for time and energy conservation. Completed functional mobility within apartment with supervision. Practiced obtaining items from overhead cabinet using L UE addressing dynamic standing balance/endurance and L UE strengthening/coordination.  Pt able to ambulate back to personal room with supervision using rollator, able to initiate need for standing rest breaks PRN. Pt left seated in recliner at end of session, all needs in reach.   Therapy Documentation Precautions:  Precautions Precautions: Fall Precaution Comments: left inattention Restrictions Weight Bearing  Restrictions: No Pain:   No/denies pain   Therapy/Group: Individual Therapy  Gian Ybarra L 01/16/2019, 6:59 AM

## 2019-01-16 NOTE — Progress Notes (Signed)
Physical Therapy Session Note  Patient Details  Name: Madison Odom MRN: [id number] Date of Birth: [birth year]  Today's Date: 01/16/2019 PT Individual Time:  -      Short Term Goals: Week 1:  PT Short Term Goal 1 (Week 1): STG = LTG due to short ELOS.  Skilled Therapeutic Interventions/Progress Updates:    Pt received in chair, willing to participate in therapy. Sit to stand transfers from chair throughout session independent with AD. Ambulated with rollator to gym approximately 175 ft with supervision for safety; pt is steady and safe with her rollator. Practiced walking up ramp, through mulch with rollator x3 with CGA for safety; down the curb with rollator x3 with minA for management of rollator.  Seated rest break in rollator; pt able to manage rollator including locking brakes before sitting down with no v/c on first attempt. Later in session, noted pt required v/c to lock rollator brakes before sitting down. Ambulation with rollator with supervision approximately 75 ft to stairs.  Practiced going up and down small steps 4x, the first two attempts with rail, then the second two attempts without rail, CGA for safety. Pt able to go up the stairs safely with a step-to pattern with no rail, but has difficulty descending without UE support. Advised pt to use HHA when descending stairs. Practiced side stepping, backwards stepping with no UE support, CGA for safety, no LOB. Static balance: feet together, eyes closed 2x30 sec; tandem stance, eyes closed 1x30 sec. No LOB; pt will benefit from further balance challenge in next session. Pt left in room with needs met, call bell in reach.  Therapy Documentation Precautions:  Precautions Precautions: Fall Precaution Comments: left inattention Restrictions Weight Bearing Restrictions: No Pain: Pain Assessment Pain Scale: 0-10 Pain Score: 0-No pain    Therapy/Group: Individual Therapy   Ronnell Guadalajara, SPT   Ronnell Guadalajara 01/16/2019, 12:13 PM

## 2019-01-16 NOTE — Progress Notes (Signed)
[name] PHYSICAL MEDICINE & REHABILITATION PROGRESS NOTE   Subjective/Complaints:  Having some reflux, requested tums, stomach upset at times.   ROS: Patient denies fever, rash, sore throat, blurred vision,   cough, shortness of breath or chest pain, joint or back pain, headache, or mood change.   Objective:   No results found. Recent Labs    01/15/19 0534  WBC 6.6  HGB 8.9*  HCT 31.2*  PLT 306   Recent Labs    01/15/19 0534  NA 138  K 3.5  CL 104  CO2 25  GLUCOSE 86  BUN 13  CREATININE 0.49  CALCIUM 8.6*    Intake/Output Summary (Last 24 hours) at 01/16/2019 0921 Last data filed at 01/16/2019 0700 Gross per 24 hour  Intake 360 ml  Output -  Net 360 ml     Physical Exam: Vital Signs Blood pressure (!) 132/59, pulse 64, temperature 97.8 F (36.6 C), resp. rate 18, height 4\' 10"  (1.473 m), weight 37.5 kg, SpO2 93 %.   Constitutional: No distress . Vital signs reviewed. HEENT: EOMI, oral membranes moist Neck: supple Cardiovascular: RRR without murmur. No JVD    Respiratory: CTA Bilaterally without wheezes or rales. Normal effort    GI: BS +, non-tender, non-distended  Skin: No evidence of breakdown, no evidence of rash Neurologic: Cranial nerves II through XII intact, motor strength is 4/5 in bilateral deltoid, bicep, tricep, grip, hip flexor, knee extensors, ankle dorsiflexor and plantar flexor Left neglect on confrontation, no changes visually Sensory exam normal sensation to light touch in bilateral lower extremities Cerebellar exam normal finger to nose to finger as well as heel to shin in bilateral upper and lower extremities--stable Musculoskeletal: Full range of motion in all 4 extremities. No joint swelling   Assessment/Plan: 1. Functional deficits secondary to Right MCA infarct with Left neglect which require 3+ hours per day of interdisciplinary therapy in a comprehensive inpatient rehab setting.  Physiatrist is providing close team supervision  and 24 hour management of active medical problems listed below.  Physiatrist and rehab team continue to assess barriers to discharge/monitor patient progress toward functional and medical goals  Care Tool:  Bathing    Body parts bathed by patient: Front perineal area, Face   Body parts bathed by helper: Buttocks, Right lower leg, Left lower leg     Bathing assist Assist Level: Moderate Assistance - Patient 50 - 74%     Upper Body Dressing/Undressing Upper body dressing   What is the patient wearing?: Button up shirt    Upper body assist Assist Level: Moderate Assistance - Patient 50 - 74%    Lower Body Dressing/Undressing Lower body dressing      What is the patient wearing?: Pants     Lower body assist Assist for lower body dressing: Minimal Assistance - Patient > 75%     Toileting Toileting    Toileting assist Assist for toileting: Supervision/Verbal cueing     Transfers Chair/bed transfer  Transfers assist     Chair/bed transfer assist level: Supervision/Verbal cueing     Locomotion Ambulation   Ambulation assist      Assist level: Supervision/Verbal cueing Assistive device: (rollator) Max distance: 150 ft    Walk 10 feet activity   Assist     Assist level: Supervision/Verbal cueing Assistive device: (rollator)   Walk 50 feet activity   Assist    Assist level: Supervision/Verbal cueing Assistive device: Walker-rolling(rollator)    Walk 150 feet activity   Assist  Assist level: Supervision/Verbal cueing Assistive device: Walker-rolling(rollator)    Walk 10 feet on uneven surface  activity   Assist     Assist level: Supervision/Verbal cueing Assistive device: (rollator)   Wheelchair     Assist Will patient use wheelchair at discharge?: No Type of Wheelchair: Manual    Wheelchair assist level: Maximal Assistance - Patient 25 - 49% Max wheelchair distance: 10 ft     Wheelchair 50 feet with 2 turns  activity    Assist    Wheelchair 50 feet with 2 turns activity did not occur: Safety/medical concerns       Wheelchair 150 feet activity     Assist Wheelchair 150 feet activity did not occur: Safety/medical concerns        Medical Problem List and Plan: 1.Functional and mobility deficitssecondary to right MCA infarct CIR PT, OT, SLP   Continue therapies 2. Antithrombotics: -DVT/anticoagulation:Pharmaceutical:Lovenox -antiplatelet therapy: ASA/Plavix X 3 weeks followed by Plavix alone 3. Pain Management:tylenol prn 4. Mood:LCSW to follow for evaluation and support. -antipsychotic agents: n/a 5. Neuropsych: This patientiscapable of making decisions on herown behalf. 6. Skin/Wound Care:Left loop incision continues to ooze---change dressing left chest wall as needed with application of pressure relief measures as well as ice prn.Routine pressure relief measures. 7. Fluids/Electrolytes/Nutrition:Monitor I/O.  8.HTN: Monitor blood pressures twice daily. Vitals:   01/15/19 2102 01/16/19 0457  BP: (!) 144/66 (!) 132/59  Pulse: (!) 57 64  Resp: 18 18  Temp: (!) 97.5 F (36.4 C) 97.8 F (36.6 C)  SpO2: 96% 93%   -borderline BP--follow 9.Iron deficiency anemia: On iron supplement. 10.Prediabetes: Hemoglobin A1c 5.8. Add CM restrictions and educate patient on diet. 11.  Protein malnutrition  12. GI upset/GERD  -iron decreased---will hold completely  -continue fiber  -TUMS prn  LOS: 2 days A FACE TO FACE EVALUATION WAS PERFORMED  Madison Odom 01/16/2019, 9:21 AM

## 2019-01-17 ENCOUNTER — Inpatient Hospital Stay (HOSPITAL_COMMUNITY): Payer: Medicare Other | Admitting: Occupational Therapy

## 2019-01-17 ENCOUNTER — Inpatient Hospital Stay (HOSPITAL_COMMUNITY): Payer: Medicare Other

## 2019-01-17 ENCOUNTER — Other Ambulatory Visit: Payer: Self-pay | Admitting: Cardiology

## 2019-01-17 ENCOUNTER — Inpatient Hospital Stay (HOSPITAL_COMMUNITY): Payer: Medicare Other | Admitting: Physical Therapy

## 2019-01-17 MED ORDER — CLOPIDOGREL BISULFATE 75 MG PO TABS: 75.0000 mg | ORAL_TABLET | Freq: Every day | ORAL | 0 refills | Status: DC

## 2019-01-17 MED ORDER — PANTOPRAZOLE SODIUM 40 MG PO TBEC
40.0000 mg | DELAYED_RELEASE_TABLET | Freq: Every day | ORAL | Status: DC
  Administered 2019-01-17 – 2019-01-18 (×2): 40 mg via ORAL
  Filled 2019-01-17 (×2): qty 1

## 2019-01-17 MED ORDER — NADOLOL 40 MG PO TABS: 40.0000 mg | ORAL_TABLET | Freq: Every day | ORAL | 0 refills | Status: DC

## 2019-01-17 MED ORDER — ASPIRIN 81 MG PO TBEC: 81.0000 mg | DELAYED_RELEASE_TABLET | Freq: Every day | ORAL | 0 refills | Status: DC

## 2019-01-17 MED ORDER — PANTOPRAZOLE SODIUM 40 MG PO TBEC: 40.0000 mg | DELAYED_RELEASE_TABLET | Freq: Every day | ORAL | 0 refills | Status: DC

## 2019-01-17 MED ORDER — SIMVASTATIN 40 MG PO TABS: 40.0000 mg | ORAL_TABLET | Freq: Every day | ORAL | 0 refills | Status: DC

## 2019-01-17 MED ORDER — ACETAMINOPHEN 325 MG PO TABS: 325.0000 mg | ORAL_TABLET | ORAL | Status: AC | PRN

## 2019-01-17 MED ORDER — CALCIUM POLYCARBOPHIL 625 MG PO TABS: 625.0000 mg | ORAL_TABLET | Freq: Every day | ORAL | 0 refills | Status: DC

## 2019-01-17 MED ORDER — FERROUS SULFATE 325 (65 FE) MG PO TABS: 325.0000 mg | ORAL_TABLET | Freq: Every day | ORAL | 3 refills | Status: AC

## 2019-01-17 MED ORDER — CALCIUM CARBONATE ANTACID 500 MG PO CHEW: 2.0000 | CHEWABLE_TABLET | ORAL | Status: DC | PRN

## 2019-01-17 NOTE — Progress Notes (Signed)
Occupational Therapy Discharge Summary  Patient Details  Name: Madison Odom MRN: [id number] Date of Birth: [birth year]  Patient has met 8 of 8 long term goals due to improved activity tolerance, improved balance, ability to compensate for deficits, functional use of  LEFT upper extremity, improved attention and improved awareness.  Patient to discharge at overall Modified Independent level.  Patient's care partner is independent to provide the necessary intermittent assistance at discharge.    Reasons goals not met: N/A  Recommendation:  Patient will benefit from ongoing skilled OT services in home health setting to continue to advance functional skills in the area of BADL and Reduce care partner burden.  Equipment: No equipment provided  Reasons for discharge: treatment goals met and discharge from hospital  Patient/family agrees with progress made and goals achieved: Yes  OT Discharge Precautions/Restrictions  Precautions Precautions: Fall Restrictions Weight Bearing Restrictions: No Pain Pain Assessment Pain Scale: 0-10 Pain Score: 0-No pain ADL ADL Eating: Independent Grooming: Modified independent Where Assessed-Grooming: Standing at sink Upper Body Bathing: Modified independent Lower Body Bathing: Modified independent Where Assessed-Lower Body Bathing: Other (Comment)(sit > stand from toilet) Upper Body Dressing: Independent Where Assessed-Upper Body Dressing: Edge of bed Lower Body Dressing: Modified independent Where Assessed-Lower Body Dressing: Edge of bed Toileting: Minimal assistance Where Assessed-Toileting: Glass blower/designer: Modified Programmer, applications Method: Counselling psychologist: Grab bars Vision Baseline Vision/History: Wears glasses Wears Glasses: At all times Patient Visual Report: No change from baseline Vision Assessment?: Yes Eye Alignment: Within Functional Limits Ocular Range of Motion: Within Functional  Limits Alignment/Gaze Preference: Within Defined Limits Tracking/Visual Pursuits: Able to track stimulus in all quads without difficulty Visual Fields: No apparent deficits Additional Comments: Mild Lt inattention.  Inconsistent with confrontation testing.  Improved from eval. Cognition Overall Cognitive Status: Within Functional Limits for tasks assessed Arousal/Alertness: Awake/alert Orientation Level: Oriented X4 Attention: Selective Selective Attention: Appears intact Memory: Appears intact Awareness: Impaired Awareness Impairment: Anticipatory impairment Problem Solving: Appears intact Safety/Judgment: Appears intact Sensation Sensation Light Touch: Appears Intact Proprioception: Appears Intact Coordination Fine Motor Movements are Fluid and Coordinated: Yes Motor  Motor Motor: Abnormal postural alignment and control Motor - Discharge Observations: generalized deconditioning Mobility  Bed Mobility Bed Mobility: Rolling Right;Rolling Left;Supine to Sit;Sit to Supine Rolling Right: Independent Rolling Left: Independent Supine to Sit: Independent Sit to Supine: Independent Transfers Sit to Stand: Independent with assistive device Stand to Sit: Independent with assistive device  Trunk/Postural Assessment  Cervical Assessment Cervical Assessment: Exceptions to WFL(forward head) Thoracic Assessment Thoracic Assessment: Exceptions to WFL(scoliosis, kyphosis) Lumbar Assessment Lumbar Assessment: Exceptions to WFL(scoliosis) Postural Control Postural Control: Deficits on evaluation Righting Reactions: delayed Protective Responses: delayed  Balance Balance Balance Assessed: Yes Standardized Balance Assessment Standardized Balance Assessment: Berg Balance Test Berg Balance Test Sit to Stand: Able to stand without using hands and stabilize independently Standing Unsupported: Able to stand 2 minutes with supervision Sitting with Back Unsupported but Feet Supported on  Floor or Stool: Able to sit safely and securely 2 minutes Stand to Sit: Sits safely with minimal use of hands Transfers: Able to transfer safely, definite need of hands Standing Unsupported with Eyes Closed: Able to stand 10 seconds with supervision Standing Ubsupported with Feet Together: Able to place feet together independently and stand for 1 minute with supervision From Standing, Reach Forward with Outstretched Arm: Can reach forward >5 cm safely (2") From Standing Position, Pick up Object from Floor: Able to pick up shoe safely and easily From Standing Position, Turn  to Look Behind Over each Shoulder: Turn sideways only but maintains balance(some difficulty 2/2 scoliosis) Turn 360 Degrees: Able to turn 360 degrees safely but slowly Standing Unsupported, Alternately Place Feet on Step/Stool: Able to complete 4 steps without aid or supervision(completes 8 steps with supervision) Standing Unsupported, One Foot in Front: Loses balance while stepping or standing Standing on One Leg: Tries to lift leg/unable to hold 3 seconds but remains standing independently Total Score: 37 Extremity/Trunk Assessment RUE Assessment RUE Assessment: Within Functional Limits Active Range of Motion (AROM) Comments: shoulder flexion grossly 100* due to previous rotator cuff repair General Strength Comments: grossly 4/5 LUE Assessment LUE Assessment: Within Functional Limits Active Range of Motion (AROM) Comments: shoulder flexion grossly 120* General Strength Comments: grossly 4/5, mild decreased fine motor coordination   Conrado Nance, [facility] 01/17/2019, 1:18 PM

## 2019-01-17 NOTE — Progress Notes (Signed)
Occupational Therapy Session Note  Patient Details  Name: Madison Odom MRN: [id number] Date of Birth: 04-13-1934  Today's Date: 01/17/2019 OT Individual Time: 1400-1440 OT Individual Time Calculation (min): 40 min  and Today's Date: 01/17/2019 OT Missed Time: 20 Minutes Missed Time Reason: Pain   Short Term Goals: Week 1:  OT Short Term Goal 1 (Week 1): STG = LTGs due to ELOS  Skilled Therapeutic Interventions/Progress Updates:    Pt received sitting up in bed with c/o indigestion. RN alerted but informed pt no longer ordered to have tums as requested. Pt brought drink to attempt and soothe. Pt agreeable to shortened session d/t no relief from pain. Pt used rollator to complete 150 ft of functional mobility with mod I. In kitchen pt completed cooking activity. Edu provided throughout re energy conservation techniques, activity pacing, and fall risk reduction strategies. Pt able to stand at counter and bimanually handle/cut zucchini/squash. Good safety awareness overall when using knife.  Pt reported pain was rising and requested to return to room. Pt completed toileting with mod I, however requiring slight assistance to navigate rollator over bathroom threshold. Pt was provided a heat pack for pain and left supine with all needs met.   Therapy Documentation Precautions:  Precautions Precautions: Fall Precaution Comments: left inattention Restrictions Weight Bearing Restrictions: No    Pain: Pain Assessment Pain Scale: 0-10 Pain Score: 5  Pain Type: Acute pain Pain Location: Back Pain Orientation: Upper Pain Descriptors / Indicators: Aching Pain Onset: On-going Pain Intervention(s): RN made aware;Rest   Therapy/Group: Individual Therapy  Curtis Sites 01/17/2019, 2:36 PM

## 2019-01-17 NOTE — Progress Notes (Addendum)
Physical Therapy Discharge Summary  Patient Details  Name: TANISA LAGACE MRN: [id number] Date of Birth: 1934-05-10  Today's Date: 01/17/2019 PT Individual Time: 1106-1201 PT Individual Time Calculation (min): 55 min    Patient has met 8 of 8 long term goals due to improved activity tolerance, improved balance, improved postural control, increased strength, ability to compensate for deficits, functional use of  left upper extremity and left lower extremity, improved awareness and improved coordination.  Patient to discharge at an ambulatory level mod I with rollator.   No family/caregivers have been present during PT sessions with this therapist to review d/c recommendations & safety needs upon d/c.   Reasons goals not met: n/a  Recommendation:  Patient will benefit from ongoing skilled PT services in home health setting to continue to advance safe functional mobility, address ongoing impairments in high level balance, stair negotiation, and minimize fall risk.  Equipment: No equipment provided - pt already has rollator  Reasons for discharge: treatment goals met and discharge from hospital  Patient/family agrees with progress made and goals achieved: Yes  Skilled PT Treatment: Pt received in room & agreeable to tx. Pt reports some back pain 2/2 MRI machine & rest breaks provided PRN. Pt completes bed mobility in apartment independently, ambulates throughout unit with rollator and mod I with good attention to L side, negotiates ramp & uneven surface with rollator & supervision, negotiates curb with therapist providing assistance to transfer rollator up/down curb but otherwise no physical assistance, negotiates 12 steps (6" + 3") with B rails and supervision and 2 steps without rails & min assist. Pt completes Berg Balance Test & scored 37/56; educated pt on interpretation of score & current fall risk. Patient demonstrates increased fall risk as noted by score of 37/56 on Berg Balance Scale.   (<36= high risk for falls, close to 100%; 37-45 significant >80%; 46-51 moderate >50%; 52-55 lower >25%). Reviewed stroke education & home safety with pt. At end of session pt left sitting in bed with alarm set & needs at hand.   PT Discharge Precautions/Restrictions Precautions Precautions: Fall Restrictions Weight Bearing Restrictions: No  Vision/Perception  Pt wears glasses at all times. Pt denies changes in baseline vision.  No L inattention observed during therapy session on this date.   Cognition Overall Cognitive Status: Within Functional Limits for tasks assessed Arousal/Alertness: Awake/alert Orientation Level: Oriented X4 Awareness: Impaired Awareness Impairment: Anticipatory impairment Safety/Judgment: Appears intact  Sensation Sensation Light Touch: Appears Intact(by gross assessment) Proprioception: Appears Intact(by gross assessment) Coordination Fine Motor Movements are Fluid and Coordinated: Yes  Motor  Motor Motor: Abnormal postural alignment and control Motor - Discharge Observations: generalized deconditioning   Mobility Bed Mobility Bed Mobility: Rolling Right;Rolling Left;Supine to Sit;Sit to Supine Rolling Right: Independent Rolling Left: Independent Supine to Sit: Independent Sit to Supine: Independent Transfers Transfers: Sit to Stand;Stand to Sit Sit to Stand: Independent with assistive device Stand to Sit: Independent with assistive device Transfer (Assistive device): (rollator)  Locomotion  Gait Ambulation: Yes Gait Assistance: Independent with assistive device Gait Distance (Feet): 150 Feet Assistive device: Rollator Gait Gait velocity: decreased Stairs / Additional Locomotion Stairs: Yes Stairs Assistance: Supervision/Verbal cueing(min assist x 2 steps (3") without rails) Stair Management Technique: Two rails Number of Stairs: 12 Height of Stairs: (6" + 3) Ramp: Supervision/Verbal cueing(ambulatory with rollator) Curb: Set up  assist(assistance to transfer rollator up/down curb, otherwise no physical assist) Wheelchair Mobility Wheelchair Mobility: No   Trunk/Postural Assessment  Cervical Assessment Cervical Assessment: Exceptions to  WFL(forward head) Thoracic Assessment Thoracic Assessment: Exceptions to WFL(scoliosis, kyphosis) Lumbar Assessment Lumbar Assessment: Exceptions to WFL(scoliosis) Postural Control Postural Control: Deficits on evaluation Righting Reactions: delayed Protective Responses: delayed   Balance Balance Balance Assessed: Yes Standardized Balance Assessment Standardized Balance Assessment: Berg Balance Test Berg Balance Test Sit to Stand: Able to stand without using hands and stabilize independently Standing Unsupported: Able to stand 2 minutes with supervision Sitting with Back Unsupported but Feet Supported on Floor or Stool: Able to sit safely and securely 2 minutes Stand to Sit: Sits safely with minimal use of hands Transfers: Able to transfer safely, definite need of hands Standing Unsupported with Eyes Closed: Able to stand 10 seconds with supervision Standing Ubsupported with Feet Together: Able to place feet together independently and stand for 1 minute with supervision From Standing, Reach Forward with Outstretched Arm: Can reach forward >5 cm safely (2") From Standing Position, Pick up Object from Floor: Able to pick up shoe safely and easily From Standing Position, Turn to Look Behind Over each Shoulder: Turn sideways only but maintains balance(some difficulty 2/2 scoliosis) Turn 360 Degrees: Able to turn 360 degrees safely but slowly Standing Unsupported, Alternately Place Feet on Step/Stool: Able to complete 4 steps without aid or supervision(completes 8 steps with supervision) Standing Unsupported, One Foot in Front: Loses balance while stepping or standing Standing on One Leg: Tries to lift leg/unable to hold 3 seconds but remains standing independently Total Score:  37  Extremity Assessment  All extremities WFL.   Waunita Schooner 01/17/2019, 12:22 PM

## 2019-01-17 NOTE — Progress Notes (Signed)
Social Work  Discharge Note  The overall goal for the admission was met for: DC SAT 3/8  Discharge location: Yes-HOME WITH INTERMITTENT ASSIST FROM DAUGHTER'S AND AIDE-2X WEEK  Length of Stay: Yes-4 DAYS  Discharge activity level: Yes-MOD/I LEVEL  Home/community participation: Yes  Services provided included: MD, RD, PT, OT, RN, CM, TR, Pharmacy and SW  Financial Services: Private Insurance: [facility]  Follow-up services arranged: Home Health: [facility]-PT & OT and Patient/Family has no preference for HH/DME agencies  Comments (or additional information):DAUGHTER'S CONCERNED GOING HOME TOO SOON, BUT HAS REACHED HER GOALS OF MOD/I AND IS SAFE WITH THIS. TEAM FEELS SHE WILL BE SAFE HOME ALONE WITH THE CARE SHE HAS IN PLACE. MAY WANT TO INCREASE THE PRIVATE DUTY DAYS TO 3-4 DAYS A WEEK OR PURSUE ASSISTED LIVING.   Patient/Family verbalized understanding of follow-up arrangements: Yes  Individual responsible for coordination of the follow-up plan: [place]  Confirmed correct DME delivered: Elease Hashimoto 01/17/2019    Elease Hashimoto

## 2019-01-17 NOTE — Progress Notes (Signed)
Occupational Therapy Session Note  Patient Details  Name: MAYLEY OBRAY MRN: [id number] Date of Birth: 1934/09/02  Today's Date: 01/17/2019 OT Individual Time: 1300-1317 OT Individual Time Calculation (min): 17 min  and Today's Date: 01/17/2019 OT Missed Time: 13 Minutes Missed Time Reason: (lunch)   Short Term Goals: Week 1:  OT Short Term Goal 1 (Week 1): STG = LTGs due to ELOS  Skilled Therapeutic Interventions/Progress Updates:    Pt received upright in bed reporting awaiting lunch tray and deferring planned session until after lunch.  Discussed with pt current goals and recommendation for supervision during bathing when bathing in walk-in tub with pt in agreement.  Pt stated that she is at her baseline and has no additional questions or concerns.  Pt made Mod I in room with Rollator and nursing staff notified.  Pt missed remaining 13 mins due to arrival of lunch tray.  Therapy Documentation Precautions:  Precautions Precautions: Fall Precaution Comments: left inattention Restrictions Weight Bearing Restrictions: No Pain: Pain Assessment Pain Scale: 0-10 Pain Score: 0-No pain   Therapy/Group: Individual Therapy  Rosalio Loud 01/17/2019, 2:22 PM

## 2019-01-17 NOTE — Discharge Instructions (Signed)
Inpatient Rehab Discharge Instructions  Madison Odom Discharge date and time: 01/18/19    Activities/Precautions/ Functional Status: Activity: no lifting, driving, or strenuous exercise for till cleared by MD Diet: cardiac diet Wound Care: keep wound clean and dry    Functional status:  ___ No restrictions     ___ Walk up steps independently ___ 24/7 supervision/assistance   ___ Walk up steps with assistance _X__ Intermittent supervision/assistance  ___ Bathe/dress independently ___ Walk with walker     ___ Bathe/dress with assistance ___ Walk Independently    ___ Shower independently ___ Walk with assistance    _X__ Shower with assistance _X__ No alcohol     ___ Return to work/school ________  Special Instructions: 1. Stop taking aspirin after  02/01/19  2. No driving.    COMMUNITY REFERRALS UPON DISCHARGE:    Home Health:   PT & OT  Agency:[facility]   Phone:[phone]   Date of last service:01/18/2019  Medical Equipment/Items Ordered:HAS FROM PREVIOUS ADMITS   Other:RESUME PRIVATE DUTY VIA HELPING HANDS  GENERAL COMMUNITY RESOURCES FOR PATIENT/FAMILY: Support Groups:[facility] SUPPORT GROUP THE SECOND Thursday @ 6:00-7:00 PM ON THE REHAB UNIT QUESTIONS CONTACT AMY [phone]   STROKE/TIA DISCHARGE INSTRUCTIONS SMOKING Cigarette smoking nearly doubles your risk of having a stroke & is the single most alterable risk factor  If you smoke or have smoked in the last 12 months, you are advised to quit smoking for your health.  Most of the excess cardiovascular risk related to smoking disappears within a year of stopping.  Ask you doctor about anti-smoking medications  [facility] Quit Line: 1-800-QUIT NOW  Free Smoking Cessation Classes [phone]  CHOLESTEROL Know your levels; limit fat & cholesterol in your diet  Lipid Panel     Component Value Date/Time   CHOL 158 01/10/2019 0602   TRIG 86 01/10/2019 0602   HDL 61 01/10/2019 0602   CHOLHDL 2.6 01/10/2019 0602   VLDL 17 01/10/2019 0602   LDLCALC 80 01/10/2019 0602      Many patients benefit from treatment even if their cholesterol is at goal.  Goal: Total Cholesterol (CHOL) less than 160  Goal:  Triglycerides (TRIG) less than 150  Goal:  HDL greater than 40  Goal:  LDL (LDLCALC) less than 100   BLOOD PRESSURE American Stroke Association blood pressure target is less that 120/80 mm/Hg  Your discharge blood pressure is:  BP: 122/63  Monitor your blood pressure  Limit your salt and alcohol intake  Many individuals will require more than one medication for high blood pressure  DIABETES (A1c is a blood sugar average for last 3 months) Goal HGBA1c is under 7% (HBGA1c is blood sugar average for last 3 months)  Diabetes: Pre diabetes    Lab Results  Component Value Date   HGBA1C 5.8 (H) 01/10/2019     Your HGBA1c can be lowered with medications, healthy diet, and exercise.  Check your blood sugar as directed by your physician  Call your physician if you experience unexplained or low blood sugars.  PHYSICAL ACTIVITY/REHABILITATION Goal is 30 minutes at least 4 days per week  Activity: No driving, Therapies:  See above Return to work: N/A  Activity decreases your risk of heart attack and stroke and makes your heart stronger.  It helps control your weight and blood pressure; helps you relax and can improve your mood.  Participate in a regular exercise program.  Talk with your doctor about the best form of exercise for you (dancing,  walking, swimming, cycling).  DIET/WEIGHT Goal is to maintain a healthy weight  Your discharge diet is:  Diet Order            Diet heart healthy/carb modified Room service appropriate? Yes; Fluid consistency: Thin  Diet effective now            liquids Your height is:  Height: 4\' 10"  (147.3 cm) Your current weight is: Weight: 37.5 kg Your Body Mass Index (BMI) is:  BMI (Calculated): 17.29  Following the type of diet specifically designed for you  will help prevent another stroke.  You are below goal weight   Your goal Body Mass Index (BMI) is 19-24.  Healthy food habits can help reduce 3 risk factors for stroke:  High cholesterol, hypertension, and excess weight.  RESOURCES Stroke/Support Group:  Call [phone]   STROKE EDUCATION PROVIDED/REVIEWED AND GIVEN TO PATIENT Stroke warning signs and symptoms How to activate emergency medical system (call 911). Medications prescribed at discharge. Need for follow-up after discharge. Personal risk factors for stroke. Pneumonia vaccine given:  Flu vaccine given:  My questions have been answered, the writing is legible, and I understand these instructions.  I will adhere to these goals & educational materials that have been provided to me after my discharge from the hospital.    My questions have been answered and I understand these instructions. I will adhere to these goals and the provided educational materials after my discharge from the hospital.  Patient/Caregiver Signature _______________________________ Date __________  Clinician Signature _______________________________________ Date __________  Please bring this form and your medication list with you to all your follow-up doctor's appointments.

## 2019-01-17 NOTE — IPOC Note (Addendum)
Overall Plan of Care [facility]) Patient Details Name: Madison Odom MRN: [id number] DOB: 02/19/1934  Admitting Diagnosis: Acute ischemic right MCA stroke [facility])  Hospital Problems: Principal Problem:   Acute ischemic right MCA stroke (HCC) Active Problems:   Hypercholesterolemia   Kyphoscoliosis   Iron deficiency anemia     Functional Problem List: Nursing Safety, Endurance, Medication Management  PT Balance, Motor, Perception  OT Balance, Endurance, Motor, Safety, Vision  SLP    TR         Basic ADL's: OT Grooming, Bathing, Dressing, Toileting     Advanced  ADL's: OT Simple Meal Preparation, Laundry     Transfers: PT Bed Mobility, Bed to Chair, Occupational psychologist, Research scientist (life sciences): PT Ambulation, Stairs     Additional Impairments: OT Fuctional Use of Upper Extremity  SLP        TR      Anticipated Outcomes Item Anticipated Outcome  Self Feeding    Swallowing      Basic self-care  Mod I  Toileting  Mod I   Bathroom Transfers Mod I  Bowel/Bladder  Supervision  Transfers  mod I   Locomotion  mod I with LRAD for household mobility  Communication     Cognition     Pain  <3 on a 0-10 pain scale  Safety/Judgment  Min assist   Therapy Plan: PT Intensity: Minimum of 1-2 x/day ,45 to 90 minutes PT Frequency: 5 out of 7 days PT Duration Estimated Length of Stay: 3-5 days OT Intensity: Minimum of 1-2 x/day, 45 to 90 minutes OT Frequency: 5 out of 7 days OT Duration/Estimated Length of Stay: 3-5 days      Team Interventions: Nursing Interventions Patient/Family Education, Medication Management, Discharge Planning, Psychosocial Support, Disease Management/Prevention  PT interventions Ambulation/gait training, Discharge planning, DME/adaptive equipment instruction, Functional mobility training, Psychosocial support, Visual/perceptual remediation/compensation, Therapeutic Exercise, Stair training, Patient/family education, Neuromuscular  re-education, Functional electrical stimulation, Disease management/prevention, Community reintegration, Warden/ranger, Pain management, Therapeutic Activities, UE/LE Strength taining/ROM, UE/LE Coordination activities  OT Interventions Warden/ranger, Cognitive remediation/compensation, Discharge planning, Disease mangement/prevention, DME/adaptive equipment instruction, Functional mobility training, Neuromuscular re-education, Pain management, Patient/family education, Psychosocial support, Self Care/advanced ADL retraining, Skin care/wound managment, Therapeutic Activities, Therapeutic Exercise, UE/LE Strength taining/ROM, UE/LE Coordination activities, Visual/perceptual remediation/compensation  SLP Interventions    TR Interventions    SW/CM Interventions Discharge Planning, Psychosocial Support, Patient/Family Education   Barriers to Discharge MD  Medical stability  Nursing Medication compliance, Decreased caregiver support, Lack of/limited family support, Home environment access/layout    PT (pt lives alone)    OT Decreased caregiver support    SLP      SW Decreased caregiver support Does not have 24 hr care   Team Discharge Planning: Destination: PT-Home ,OT- Home , SLP-  Projected Follow-up: PT-Home health PT, OT-  Home health OT, SLP-  Projected Equipment Needs: PT-None recommended by PT, OT- None recommended by OT, SLP-  Equipment Details: PT-pt already has rollator, OT-  Patient/family involved in discharge planning: PT- Patient,  OT-Patient, SLP-   MD ELOS: approximately 5 days Medical Rehab Prognosis:  Excellent Assessment: The patient has been admitted for CIR therapies with the diagnosis of right MCA infarct. The team will be addressing functional mobility, strength, stamina, balance, safety, adaptive techniques and equipment, self-care, bowel and bladder mgt, patient and caregiver education, visual-spatial awareness, NMR, community reentry. Goals  have been set at mod I for mobility, self-care .    Earna Coder  Ermalene Postin, MD, FAAPMR      See Team Conference Notes for weekly updates to the plan of care

## 2019-01-17 NOTE — Progress Notes (Signed)
[place] PHYSICAL MEDICINE & REHABILITATION PROGRESS NOTE   Subjective/Complaints:  Still having reflux despite TUMS. Otherwise making progress with balance and mobility  ROS: Patient denies fever, rash, sore throat, blurred vision, nausea, vomiting, diarrhea, cough, shortness of breath or chest pain, joint or back pain, headache, or mood change.   Objective:   No results found. Recent Labs    01/15/19 0534  WBC 6.6  HGB 8.9*  HCT 31.2*  PLT 306   Recent Labs    01/15/19 0534  NA 138  K 3.5  CL 104  CO2 25  GLUCOSE 86  BUN 13  CREATININE 0.49  CALCIUM 8.6*    Intake/Output Summary (Last 24 hours) at 01/17/2019 1026 Last data filed at 01/17/2019 0850 Gross per 24 hour  Intake 360 ml  Output -  Net 360 ml     Physical Exam: Vital Signs Blood pressure 122/63, pulse 61, temperature 98.7 F (37.1 C), temperature source Oral, resp. rate 17, height 4\' 10"  (1.473 m), weight 37.5 kg, SpO2 97 %.   Constitutional: No distress . Vital signs reviewed. HEENT: EOMI, oral membranes moist Neck: supple Cardiovascular: RRR without murmur. No JVD    Respiratory: CTA Bilaterally without wheezes or rales. Normal effort    GI: BS +, non-tender, non-distended   Skin: No evidence of breakdown, no evidence of rash Neurologic: Cranial nerves II through XII intact, motor strength is 4/5 in bilateral deltoid, bicep, tricep, grip, hip flexor, knee extensors, ankle dorsiflexor and plantar flexor Left neglect on confrontation is improving  Sensory exam normal sensation to light touch in bilateral lower extremities  Musculoskeletal: Full range of motion in all 4 extremities. No joint swelling   Assessment/Plan: 1. Functional deficits secondary to Right MCA infarct with Left neglect which require 3+ hours per day of interdisciplinary therapy in a comprehensive inpatient rehab setting.  Physiatrist is providing close team supervision and 24 hour management of active medical problems listed  below.  Physiatrist and rehab team continue to assess barriers to discharge/monitor patient progress toward functional and medical goals  Care Tool:  Bathing  Bathing activity did not occur: Refused Body parts bathed by patient: Right arm, Left arm, Chest, Abdomen, Front perineal area, Buttocks, Right upper leg, Left upper leg, Right lower leg, Face, Left lower leg   Body parts bathed by helper: Buttocks, Right lower leg, Left lower leg     Bathing assist Assist Level: Independent with assistive device     Upper Body Dressing/Undressing Upper body dressing   What is the patient wearing?: Bra, Pull over shirt    Upper body assist Assist Level: Independent    Lower Body Dressing/Undressing Lower body dressing      What is the patient wearing?: Underwear/pull up, Pants     Lower body assist Assist for lower body dressing: Independent with assitive device     Toileting Toileting    Toileting assist Assist for toileting: Independent with assistive device Assistive Device Comment: (Rolling Walker)   Transfers Chair/bed transfer  Transfers assist     Chair/bed transfer assist level: Independent with assistive device     Locomotion Ambulation   Ambulation assist      Assist level: Supervision/Verbal cueing Assistive device: Other (comment)(rollator) Max distance: 175 ft   Walk 10 feet activity   Assist     Assist level: Supervision/Verbal cueing Assistive device: Other (comment)(rollator)   Walk 50 feet activity   Assist    Assist level: Supervision/Verbal cueing Assistive device: Other (comment)(rollator)  Walk 150 feet activity   Assist    Assist level: Supervision/Verbal cueing Assistive device: Other (comment)(rollator)    Walk 10 feet on uneven surface  activity   Assist     Assist level: Supervision/Verbal cueing Assistive device: Other (comment)(rollator)   Wheelchair     Assist Will patient use wheelchair at  discharge?: No Type of Wheelchair: Manual    Wheelchair assist level: Maximal Assistance - Patient 25 - 49% Max wheelchair distance: 10 ft     Wheelchair 50 feet with 2 turns activity    Assist    Wheelchair 50 feet with 2 turns activity did not occur: Safety/medical concerns       Wheelchair 150 feet activity     Assist Wheelchair 150 feet activity did not occur: Safety/medical concerns        Medical Problem List and Plan: 1.Functional and mobility deficitssecondary to right MCA infarct -Continue CIR therapies including PT, OT, and SLP  2. Antithrombotics: -DVT/anticoagulation:Pharmaceutical:Lovenox -antiplatelet therapy: ASA/Plavix X 3 weeks followed by Plavix alone 3. Pain Management:tylenol prn 4. Mood:LCSW to follow for evaluation and support. -antipsychotic agents: n/a 5. Neuropsych: This patientiscapable of making decisions on herown behalf. 6. Skin/Wound Care:Left loop incision continues to ooze---change dressing left chest wall as needed with application of pressure relief measures as well as ice prn.Routine pressure relief measures. 7. Fluids/Electrolytes/Nutrition:Monitor I/O.  8.HTN: Monitor blood pressures twice daily. Vitals:   01/16/19 2033 01/17/19 0348  BP: (!) 126/55 122/63  Pulse: 62 61  Resp: 18 17  Temp: 98.4 F (36.9 C) 98.7 F (37.1 C)  SpO2: 94% 97%   -reasonable control 3/6 9.Iron deficiency anemia: On iron supplement. 10.Prediabetes: Hemoglobin A1c 5.8. Add CM restrictions and educate patient on diet. 11.  Protein malnutrition  12. GI upset/GERD  -have held iron completely  -continue fiber  -TUMS prn  -begin scheduled protonix 40mg  daily  LOS: 3 days A FACE TO FACE EVALUATION WAS PERFORMED  Ranelle Oyster 01/17/2019, 10:26 AM

## 2019-01-17 NOTE — Progress Notes (Signed)
Occupational Therapy Session Note  Patient Details  Name: GLENYS BEGEMAN MRN: [id number] Date of Birth: 1934/01/16  Today's Date: 01/17/2019 OT Individual Time: 0905-1003 OT Individual Time Calculation (min): 58 min    Short Term Goals: Week 1:  OT Short Term Goal 1 (Week 1): STG = LTGs due to ELOS  Skilled Therapeutic Interventions/Progress Updates:    Completed ADL retraining at overall Mod I level.  Pt received supine in bed agreeable to therapy session.  Pt ambulated around room with personal Rollator to gather clothing prior to washing up at sit > stand level and dressing.  Pt completed toilet transfers and toileting at Mod I level.  Completed washing while on toilet (as pt has walk-in tub at home), then completed dressing at sit > stand level from EOB.  Pt utilized shoe horn when donning shoes and required increased time when fastening bra and buttoning shirt but able to complete without assistance.  Discussed possible use of button hook if pt continues to require increased time.  Pt completed grooming tasks in standing at sink.  Completed 9 hole peg test - Rt: 43 seconds and Lt: 1:22; Box and Blocks assessment - Rt: 36 blocks and Lt: 22 blocks.  Pt reports pleased with progress and ready to be at home in her own bed.  Pt remained upright in recliner with all needs in reach.  Therapy Documentation Precautions:  Precautions Precautions: Fall Precaution Comments: left inattention Restrictions Weight Bearing Restrictions: No Pain:  Pt with no c/o pain   Therapy/Group: Individual Therapy  Rosalio Loud 01/17/2019, 10:13 AM

## 2019-01-18 NOTE — Progress Notes (Signed)
Patient was discharged from unit with daughter and other family. Patient left with all personal belongings and all questions were answered. Patient states she will attend all follow up appointments. Reuben Likes, LPN

## 2019-01-18 NOTE — Progress Notes (Signed)
[place] PHYSICAL MEDICINE & REHABILITATION PROGRESS NOTE   Subjective/Complaints: Patient seen sitting up in bed this morning.  She is ready for discharge.  ROS: Denies CP, SOB, N/V/D  Objective:   No results found. No results for input(s): WBC, HGB, HCT, PLT in the last 72 hours. No results for input(s): NA, K, CL, CO2, GLUCOSE, BUN, CREATININE, CALCIUM in the last 72 hours.  Intake/Output Summary (Last 24 hours) at 01/18/2019 1022 Last data filed at 01/18/2019 0807 Gross per 24 hour  Intake 684 ml  Output -  Net 684 ml     Physical Exam: Vital Signs Blood pressure (!) 128/52, pulse 62, temperature 97.9 F (36.6 C), resp. rate 19, height 4\' 10"  (1.473 m), weight 37.5 kg, SpO2 96 %.   Constitutional: No distress . Vital signs reviewed. HENT: Normocephalic.  Atraumatic. Eyes: EOMI. No discharge. Cardiovascular: RRR. No JVD. Respiratory: CTA Bilaterally. Normal effort. GI: BS +. Non-distended. Musc: No edema or tenderness in extremities. Skin: No evidence of breakdown, no evidence of rash Neurologic: Alert Motor: 4/5 in bilateral deltoid, bicep, tricep, grip, hip flexor, knee extensors, ankle dorsiflexor and plantar flexor   Assessment/Plan: 1. Functional deficits secondary to Right MCA infarct with Left neglect which require 3+ hours per day of interdisciplinary therapy in a comprehensive inpatient rehab setting.  Physiatrist is providing close team supervision and 24 hour management of active medical problems listed below.  Physiatrist and rehab team continue to assess barriers to discharge/monitor patient progress toward functional and medical goals  Care Tool:  Bathing  Bathing activity did not occur: Refused Body parts bathed by patient: Right arm, Left arm, Chest, Abdomen, Front perineal area, Buttocks, Right upper leg, Left upper leg, Right lower leg, Face, Left lower leg   Body parts bathed by helper: Buttocks, Right lower leg, Left lower leg     Bathing  assist Assist Level: Independent with assistive device     Upper Body Dressing/Undressing Upper body dressing   What is the patient wearing?: Bra, Pull over shirt    Upper body assist Assist Level: Independent    Lower Body Dressing/Undressing Lower body dressing      What is the patient wearing?: Underwear/pull up, Pants     Lower body assist Assist for lower body dressing: Independent with assitive device     Toileting Toileting    Toileting assist Assist for toileting: Independent with assistive device Assistive Device Comment: (Rolling Walker)   Transfers Chair/bed transfer  Transfers assist     Chair/bed transfer assist level: Independent with assistive device Chair/bed transfer assistive device: (rollator)   Locomotion Ambulation   Ambulation assist      Assist level: Independent with assistive device Assistive device: (rollator) Max distance: 150 ft    Walk 10 feet activity   Assist     Assist level: Independent with assistive device Assistive device: (rollator)   Walk 50 feet activity   Assist    Assist level: Independent with assistive device Assistive device: (rollator)    Walk 150 feet activity   Assist    Assist level: Independent with assistive device Assistive device: (rollator)    Walk 10 feet on uneven surface  activity   Assist     Assist level: Supervision/Verbal cueing Assistive device: (rollator)   Wheelchair     Assist Will patient use wheelchair at discharge?: No Type of Wheelchair: Manual Wheelchair activity did not occur: N/A  Wheelchair assist level: Maximal Assistance - Patient 25 - 49% Max wheelchair distance: 10  ft     Wheelchair 50 feet with 2 turns activity    Assist    Wheelchair 50 feet with 2 turns activity did not occur: N/A       Wheelchair 150 feet activity     Assist Wheelchair 150 feet activity did not occur: N/A        Medical Problem List and  Plan: 1.Functional and mobility deficitssecondary to right MCA infarct DC today  Patient to follow-up with rehab MD for transitional care management in 1 to 2 weeks post discharge 2. Antithrombotics: -DVT/anticoagulation:Pharmaceutical:Lovenox -antiplatelet therapy: ASA/Plavix X 3 weeks followed by Plavix alone 3. Pain Management:tylenol prn 4. Mood:LCSW to follow for evaluation and support. -antipsychotic agents: n/a 5. Neuropsych: This patientiscapable of making decisions on herown behalf. 6. Skin/Wound Care:Local care to quarter size 7. Fluids/Electrolytes/Nutrition:Monitor I/O.  8.HTN: Monitor blood pressures twice daily. Vitals:   01/18/19 0448 01/18/19 0803  BP: (!) 127/54 (!) 128/52  Pulse: 60 62  Resp: 19   Temp: 97.9 F (36.6 C)   SpO2: 96%    Controlled on 3/7 9.Iron deficiency anemia: On iron supplement. 10.Prediabetes: Hemoglobin A1c 5.8. Added CM restrictions and educate patient on diet. 11.  Protein malnutrition  12. GI upset/GERD  -have held iron completely  -continue fiber  -TUMS prn  -scheduled protonix 40mg  daily  LOS: 4 days A FACE TO FACE EVALUATION WAS PERFORMED  Katharin Schneider Karis Juba 01/18/2019, 10:22 AM

## 2019-01-20 ENCOUNTER — Telehealth: Payer: Self-pay | Admitting: Adult Health

## 2019-01-20 ENCOUNTER — Telehealth: Payer: Self-pay

## 2019-01-20 ENCOUNTER — Encounter: Payer: Medicare Other | Admitting: Physical Therapy

## 2019-01-20 NOTE — Telephone Encounter (Signed)
Copied from CRM [id number]. Topic: Quick Communication - Home Health Verbal Orders >> Jan 20, 2019  4:31 PM Lorayne Bender wrote: Caller/Agency: Rolm Gala from [facility] Number: [phone], OK to leave a message Requesting OT/PT/Skilled Nursing/Social Work/Speech Therapy: PT Frequency: 2x a week for 4 weeks.

## 2019-01-20 NOTE — Telephone Encounter (Signed)
Transitional Care--daughter Marisue Ivan    1. Are you/is patient experiencing any problems since coming home? No Are there any questions regarding any aspect of care? No 2. Are there any questions regarding medications administration/dosing? No Are meds being taken as prescribed? Yes Patient should review meds with caller to confirm 3. Have there been any falls? No 4. Has Home Health been to the house and/or have they contacted you? Yes If not, have you tried to contact them? Can we help you contact them? 5. Are bowels and bladder emptying properly? Yes Are there any unexpected incontinence issues? No If applicable, is patient following bowel/bladder programs? 6. Any fevers, problems with breathing, unexpected pain? No 7. Are there any skin problems or new areas of breakdown? No 8. Has the patient/family member arranged specialty MD follow up (ie cardiology/neurology/renal/surgical/etc)? Yes  Can we help arrange? 9. Does the patient need any other services or support that we can help arrange? No 10. Are caregivers following through as expected in assisting the patient? Yes 11. Has the patient quit smoking, drinking alcohol, or using drugs as recommended? Yes  Appointment time 11:20 am, arrive time 11:00 am with Riley Lam on 01/29/2019 [address] [employer] [address]

## 2019-01-21 ENCOUNTER — Encounter: Payer: Self-pay | Admitting: Adult Health

## 2019-01-21 ENCOUNTER — Ambulatory Visit: Payer: Medicare Other | Admitting: Adult Health

## 2019-01-21 VITALS — BP 160/80 | HR 63 | Temp 98.0°F | Wt 77.8 lb

## 2019-01-21 DIAGNOSIS — I69398 Other sequelae of cerebral infarction: Secondary | ICD-10-CM

## 2019-01-21 DIAGNOSIS — I63511 Cerebral infarction due to unspecified occlusion or stenosis of right middle cerebral artery: Secondary | ICD-10-CM

## 2019-01-21 DIAGNOSIS — R350 Frequency of micturition: Secondary | ICD-10-CM

## 2019-01-21 DIAGNOSIS — R2681 Unsteadiness on feet: Secondary | ICD-10-CM

## 2019-01-21 DIAGNOSIS — D508 Other iron deficiency anemias: Secondary | ICD-10-CM

## 2019-01-21 LAB — CBC WITH DIFFERENTIAL/PLATELET
BASOS ABS: 0.1 10*3/uL (ref 0.0–0.1)
Basophils Relative: 0.9 % (ref 0.0–3.0)
Eosinophils Absolute: 0.1 10*3/uL (ref 0.0–0.7)
Eosinophils Relative: 1.4 % (ref 0.0–5.0)
HCT: 33.1 % — ABNORMAL LOW (ref 36.0–46.0)
Hemoglobin: 9.9 g/dL — ABNORMAL LOW (ref 12.0–15.0)
Lymphocytes Relative: 20.9 % (ref 12.0–46.0)
Lymphs Abs: 1.4 10*3/uL (ref 0.7–4.0)
MCHC: 30 g/dL (ref 30.0–36.0)
MCV: 71.7 fl — ABNORMAL LOW (ref 78.0–100.0)
Monocytes Absolute: 0.8 10*3/uL (ref 0.1–1.0)
Monocytes Relative: 11.1 % (ref 3.0–12.0)
NEUTROS ABS: 4.5 10*3/uL (ref 1.4–7.7)
Neutrophils Relative %: 65.7 % (ref 43.0–77.0)
PLATELETS: 340 10*3/uL (ref 150.0–400.0)
RBC: 4.62 Mil/uL (ref 3.87–5.11)
RDW: 27.5 % — ABNORMAL HIGH (ref 11.5–15.5)
WBC: 6.8 10*3/uL (ref 4.0–10.5)

## 2019-01-21 LAB — BASIC METABOLIC PANEL
BUN: 27 mg/dL — ABNORMAL HIGH (ref 6–23)
CHLORIDE: 103 meq/L (ref 96–112)
CO2: 30 mEq/L (ref 19–32)
Calcium: 9.3 mg/dL (ref 8.4–10.5)
Creatinine, Ser: 0.41 mg/dL (ref 0.40–1.20)
GFR: 148.23 mL/min (ref >60.00)
Glucose, Bld: 84 mg/dL (ref 70–99)
Potassium: 4 mEq/L (ref 3.5–5.1)
Sodium: 142 mEq/L (ref 135–145)

## 2019-01-21 NOTE — Telephone Encounter (Signed)
Ok for verbal orders ?

## 2019-01-21 NOTE — Telephone Encounter (Signed)
Rolm Gala notified to proceed with orders.

## 2019-01-21 NOTE — Telephone Encounter (Signed)
Pt here in [facility] to be seen.  Please advise.

## 2019-01-21 NOTE — Patient Instructions (Signed)
It was great seeing you today and I am glad you are feeling better.   Please continue to stay as active as you can   Continue with your follow up appointments   We will follow up with you regarding your blood work

## 2019-01-21 NOTE — Progress Notes (Signed)
Subjective:    Patient ID: Madison Odom, female    DOB: 1934-08-16, 85 y.o.   MRN: [id number]  HPI  85 year old female who  has a past medical history of Anemia, GERD (gastroesophageal reflux disease), Hyperlipidemia, Hypertension, Hypertensive retinopathy, MVP (mitral valve prolapse), OP (osteoporosis), Pelvis fracture (HCC) (09/2015), Scoliosis, and Spastic colon.   She presents to the office today for TCM visit   Admit Date: 01/09/2019 Discharge Date: 01/18/2019  He was taken to the emergency room on 01/09/2019 with increased difficulty walking and was found to have right neglect on evaluation in the emergency room.  MRI of the brain was done revealing acute ischemic infarct in posterior right MCA territory affecting the right parietal lobe.  2D echo showed an EF of 60 to 65% with moderate basal septal hypertrophy, normal trolled valve with trivial regurg and moderate thickening of the aortic valve with mild regurg.  All in the hospital but she was found to have iron deficiency anemia with a hemoglobin of 8.8 and was started on an iron supplement.  The stroke was felt to be embolic due to unknown source and a loop recorder was placed on 3/7.  Her TEE was negative for PFO or thrombus.  She was started on DAPT with recommendations to discontinue aspirin after 3 weeks.  Was then admitted to inpatient rehab on 01/14/2019 for therapies to consist of PT and OT for at least 3 hours 5 days a week.  While in the hospital she was started on iron supplement but was unable to tolerate this due to GI distress and diarrhea therefore it was discontinued.  She was advised to follow-up with her PCP regarding alternative supplementation.  Admission the patient required moderate assist with basic ADL tasks and supervision with mobility.  During rehab she had significant improvement in activity tolerance, balance, postural control as well as the ability to compensate for deficits.  She was able to complete her ADL  tasks at moderate independent level.   In the office, she reports that she is continuing to improve.  Home health PT/OT came to the house yesterday for their initial visit and will be continuing at home for at least a couple weeks, she then plans to follow-up with outpatient physical therapy.  Her daughters who are with her today reports that they feel as though she has been coming along just fine since being discharged from the hospital.  She continues to use a rolling walker at home and does feel somewhat unsteady with this but this is also improving.  She denies any falls at home.  Her daughter also reports that they had started her on a lower dose iron supplement and that the patient has been tolerating this well.  She no longer has any GI distress or diarrhea.  Patient reports that her biggest deficit at this time is balance and being able to button her shirts.  Only and patient report no slurred speech, facial droop, or changes in sensation since being discharged from the hospital  She has an upcoming appointment with physical medicine and rehab as well as neurology  Additionally, she reports that she has had some urinary frequency since being discharged from the hospital.  She reports no lower abdominal pain, back pain, dysuria, or hematuria.   Review of Systems  Constitutional: Negative.   HENT: Negative.   Eyes: Negative.   Respiratory: Negative.   Cardiovascular: Negative.   Gastrointestinal: Negative.   Endocrine: Negative.   Musculoskeletal: Positive  for arthralgias and gait problem.  Allergic/Immunologic: Negative.   Neurological: Positive for weakness.  Hematological: Negative.   All other systems reviewed and are negative.  Past Medical History:  Diagnosis Date  . Anemia   . GERD (gastroesophageal reflux disease)   . Hyperlipidemia   . Hypertension   . Hypertensive retinopathy   . MVP (mitral valve prolapse)   . OP (osteoporosis)   . Pelvis fracture (HCC) 09/2015    CLOSED   . Scoliosis   . Spastic colon     Social History   Socioeconomic History  . Marital status: Widowed    Spouse name: Not on file  . Number of children: Not on file  . Years of education: Not on file  . Highest education level: Not on file  Occupational History  . Not on file  Social Needs  . Financial resource strain: Not on file  . Food insecurity:    Worry: Not on file    Inability: Not on file  . Transportation needs:    Medical: Not on file    Non-medical: Not on file  Tobacco Use  . Smoking status: Never Smoker  . Smokeless tobacco: Never Used  Substance and Sexual Activity  . Alcohol use: No  . Drug use: No  . Sexual activity: Not on file  Lifestyle  . Physical activity:    Days per week: Not on file    Minutes per session: Not on file  . Stress: Not on file  Relationships  . Social connections:    Talks on phone: Not on file    Gets together: Not on file    Attends religious service: Not on file    Active member of club or organization: Not on file    Attends meetings of clubs or organizations: Not on file    Relationship status: Not on file  . Intimate partner violence:    Fear of current or ex partner: Not on file    Emotionally abused: Not on file    Physically abused: Not on file    Forced sexual activity: Not on file  Other Topics Concern  . Not on file  Social History Narrative   1 year of college    Widow   Two daughters     Past Surgical History:  Procedure Laterality Date  . ABDOMINAL HYSTERECTOMY  1965  . APPENDECTOMY    . BILATERAL OOPHORECTOMY  1981  . EYE SURGERY    . LOOP RECORDER INSERTION Left 01/13/2019   Procedure: LOOP RECORDER INSERTION;  Surgeon: Marinus Maw, MD;  Location: [facility] INVASIVE CV LAB;  Service: Cardiovascular;  Laterality: Left;  . ROTATOR CUFF REPAIR Right   . TEE WITHOUT CARDIOVERSION N/A 01/13/2019   Procedure: TRANSESOPHAGEAL ECHOCARDIOGRAM (TEE);  Surgeon: Thurmon Fair, MD;  Location: [facility] ENDOSCOPY;   Service: Cardiovascular;  Laterality: N/A;  loop    Family History  Problem Relation Age of Onset  . Heart attack Mother   . Hypertension Mother   . Heart disease Father   . Hypertension Father   . Stroke Neg Hx     Allergies  Allergen Reactions  . Phenothiazines Other (See Comments) and Anaphylaxis    Unknown reaction  . Prochlorperazine Anaphylaxis  . Benadryl [Diphenhydramine]     Agitation , confusion   . Ferrous Sulfate     Abdominal pain/diarrhea  . Penicillins     Yeast infections Did it involve swelling of the face/tongue/throat, SOB, or low BP? No Did  it involve sudden or severe rash/hives, skin peeling, or any reaction on the inside of your mouth or nose? No Did you need to seek medical attention at a hospital or doctor's office? No When did it last happen?30 yrs If all above answers are "NO", may proceed with cephalosporin use.   . Tetracycline     Other reaction(s): Other (See Comments) Yeast infection    Current Outpatient Medications on File Prior to Visit  Medication Sig Dispense Refill  . acetaminophen (TYLENOL) 325 MG tablet Take 1-2 tablets (325-650 mg total) by mouth every 4 (four) hours as needed for mild pain.    Marland Kitchen aspirin 81 MG EC tablet Take 1 tablet (81 mg total) by mouth daily. Continue for 2 more weeks then stop. 14 tablet 0  . Calcium Carb-Cholecalciferol (CALCIUM 500 +D PO) Take 1 tablet by mouth daily.     . clopidogrel (PLAVIX) 75 MG tablet Take 1 tablet (75 mg total) by mouth daily. 30 tablet 0  . ferrous sulfate 325 (65 FE) MG tablet Take 1 tablet (325 mg total) by mouth daily at 12 noon. 30 tablet 3  . Multiple Vitamin (MULTIVITAMIN) tablet Take 1 tablet by mouth daily.    . nadolol (CORGARD) 40 MG tablet Take 1 tablet (40 mg total) by mouth daily. 30 tablet 0  . pantoprazole (PROTONIX) 40 MG tablet Take 1 tablet (40 mg total) by mouth daily. 30 tablet 0  . simvastatin (ZOCOR) 40 MG tablet Take 1 tablet (40 mg total) by mouth daily at  6 PM. 30 tablet 0   No current facility-administered medications on file prior to visit.     BP (!) 160/80   Pulse 63   Temp 98 F (36.7 C)   Wt 77 lb 12.8 oz (35.3 kg)   SpO2 98%   BMI 16.26 kg/m       Objective:   Physical Exam Vitals signs and nursing note reviewed.  Constitutional:      Appearance: Normal appearance.  Cardiovascular:     Rate and Rhythm: Normal rate and regular rhythm.     Pulses: Normal pulses.     Heart sounds: Normal heart sounds.  Pulmonary:     Effort: Pulmonary effort is normal.     Breath sounds: Normal breath sounds.  Musculoskeletal: Normal range of motion.  Skin:    General: Skin is warm and dry.     Capillary Refill: Capillary refill takes less than 2 seconds.     Comments: Well-healed surgical incision on left chest wall from loop recorder.  No signs of infection or active drainage  Neurological:     General: No focal deficit present.     Mental Status: She is alert and oriented to person, place, and time.     Cranial Nerves: Cranial nerves are intact. No cranial nerve deficit.     Sensory: Sensation is intact. No sensory deficit.     Motor: No weakness or pronator drift.     Coordination: Finger-Nose-Finger Test normal.     Gait: Gait abnormal.     Deep Tendon Reflexes: Reflexes normal.     Comments: Has a slow steady gait with rolling walker. 5 out of 5 grip strength bilaterally 5 out of 5 push and pull strength bilateral upper and lower extremities  Psychiatric:        Mood and Affect: Mood normal.        Behavior: Behavior normal.        Thought Content: Thought content  normal.        Judgment: Judgment normal.       Assessment & Plan:  1. Acute ischemic right MCA stroke (HCC) -She seems to be recovering well and has minimal deficits.  We reviewed hospital notes, imaging, and labs.  All questions were answered to the best of my ability.  Vies to continue with follow-up appointments as directed.  I would like her to monitor  her blood pressure at home and report back to me with readings in 2 weeks. - CBC with Differential/Platelet - Iron, TIBC and Ferritin Panel - Basic Metabolic Panel  2. Gait instability -Continue with PT OT.  Use walker at all times when ambulating.  Okay for her to walk around the house when she is not participating in therapies - CBC with Differential/Platelet - Iron, TIBC and Ferritin Panel - Basic Metabolic Panel  3. Iron deficiency anemia secondary to inadequate dietary iron intake -Have her continue on lower dose of oral iron supplementation.  Advised if GI issues return then she can=take medication every other day.  Advised adding vitamin C for absorption - CBC with Differential/Platelet - Iron, TIBC and Ferritin Panel - Basic Metabolic Panel  4. Urinary frequency  - POC Urinalysis Dipstick  [name], NP

## 2019-01-22 ENCOUNTER — Encounter: Payer: Medicare Other | Admitting: Physical Therapy

## 2019-01-22 LAB — IRON,TIBC AND FERRITIN PANEL
%SAT: 5 % (calc) — ABNORMAL LOW (ref 16–45)
Ferritin: 26 ng/mL (ref 16–288)
Iron: 24 ug/dL — ABNORMAL LOW (ref 45–160)
TIBC: 452 mcg/dL (calc) — ABNORMAL HIGH (ref 250–450)

## 2019-01-22 LAB — POCT URINALYSIS DIPSTICK
Bilirubin, UA: NEGATIVE
Glucose, UA: NEGATIVE
Ketones, UA: NEGATIVE
LEUKOCYTES UA: NEGATIVE
Nitrite, UA: NEGATIVE
Odor: NEGATIVE
Protein, UA: POSITIVE — AB
RBC UA: NEGATIVE
Spec Grav, UA: 1.015 (ref 1.010–1.025)
Urobilinogen, UA: 0.2 E.U./dL
pH, UA: 7 (ref 5.0–8.0)

## 2019-01-23 ENCOUNTER — Encounter: Payer: Medicare Other | Admitting: Adult Health

## 2019-01-23 ENCOUNTER — Telehealth: Payer: Self-pay

## 2019-01-23 ENCOUNTER — Other Ambulatory Visit: Payer: Self-pay

## 2019-01-23 ENCOUNTER — Ambulatory Visit (INDEPENDENT_AMBULATORY_CARE_PROVIDER_SITE_OTHER): Payer: Medicare Other | Admitting: Nurse Practitioner

## 2019-01-23 DIAGNOSIS — I639 Cerebral infarction, unspecified: Secondary | ICD-10-CM

## 2019-01-23 LAB — CUP PACEART INCLINIC DEVICE CHECK
Implantable Pulse Generator Implant Date: 20200302
MDC IDC SESS DTM: 20200312135820

## 2019-01-23 NOTE — Telephone Encounter (Signed)
I left a voicemail for the patient to send a manual transmission with her home monitor. I left the office number for her to call back if she has any questions.

## 2019-01-23 NOTE — Progress Notes (Signed)
ILR wound check in clinic.  Wound well healed. Home monitor transmitting nightly. 22 AF episodes, all false. AF detection changed to less sensitive. AF storage changed to 6 minutes.  Questions answered.

## 2019-01-24 ENCOUNTER — Other Ambulatory Visit: Payer: Self-pay | Admitting: Cardiology

## 2019-01-24 NOTE — Telephone Encounter (Signed)
Spoke with patient. Advised transmission was received. All episodes previously reviewed at wound check on 01/24/19.   Patient reports her ILR implant site is sore as she accidentally rolled over onto it last night. Advised she can try some acetaminophen for the discomfort. Pt agrees to call for any signs/symptoms of infection or bleeding at site. She denies additional questions or concerns at this time and thanked me for my call.

## 2019-01-27 ENCOUNTER — Other Ambulatory Visit: Payer: Self-pay

## 2019-01-27 ENCOUNTER — Encounter: Payer: Medicare Other | Admitting: Physical Therapy

## 2019-01-27 NOTE — Patient Outreach (Signed)
[facility]) Care Management  01/27/2019  Tinslee A Hyser 03/01/1934 [id number]   EMMI-Stroke-Not on APL RED ON EMMI ALERT Day # 6  Date: 01/26/2019 Red Alert Reason: "Feeling worse overall? Yes"   Incoming call from patient returning RN CM call. Patient voices that she is feeling well. She denies any acute issues or concerns at this time. RN CM reviewed and addressed red alert with patient. She states error in response as she did not mean to respond that way. She denies any symptoms of feeling worse. She goes or MD follow up appt this week. She voices no issues with transportation. RN CM confirmed with patient that she has all her meds in the home and denies any issues or concerns regarding them.Advised patient that they would continue to get automated EMMI-Stroke post discharge calls to assess how they are doing following recent hospitalization and will receive a call from a nurse if any of their responses were abnormal. Patient voiced understanding and was appreciative of f/u call.   Plan: RN CM will close case as no further interventions needed at this time.   Antionette Fairy, RN,BSN,CCM [facility] Care Management Telephonic Care Management Coordinator Direct Phone: [phone] Toll Free: [phone] Fax: [phone]

## 2019-01-27 NOTE — Patient Outreach (Signed)
[facility]) Care Management  01/27/2019  Madison Odom 01-23-1934 [id number]    EMMI-Stroke-Not on APL RED ON EMMI ALERT Day # 6  Date: 01/26/2019 Red Alert Reason: "Feeling worse overall? Yes"   Outreach attempt #1 to patient . No answer. RN CM left HIPAA compliant voicemail message along with contact info.      Plan: RN CM will make outreach attempt to patient within 3-4 business days.  Antionette Fairy, RN,BSN,CCM [facility] Care Management Telephonic Care Management Coordinator Direct Phone: [phone] Toll Free: [phone] Fax: [phone]

## 2019-01-29 ENCOUNTER — Encounter: Payer: Medicare Other | Admitting: Physical Therapy

## 2019-01-29 ENCOUNTER — Encounter: Payer: Self-pay | Admitting: Registered Nurse

## 2019-01-29 ENCOUNTER — Encounter: Payer: Medicare Other | Attending: Registered Nurse | Admitting: Registered Nurse

## 2019-01-29 ENCOUNTER — Other Ambulatory Visit: Payer: Self-pay

## 2019-01-29 VITALS — BP 174/82 | HR 67 | Ht <= 58 in | Wt 76.8 lb

## 2019-01-29 DIAGNOSIS — E785 Hyperlipidemia, unspecified: Secondary | ICD-10-CM | POA: Diagnosis not present

## 2019-01-29 DIAGNOSIS — I63511 Cerebral infarction due to unspecified occlusion or stenosis of right middle cerebral artery: Secondary | ICD-10-CM | POA: Diagnosis not present

## 2019-01-29 DIAGNOSIS — I10 Essential (primary) hypertension: Secondary | ICD-10-CM | POA: Diagnosis not present

## 2019-01-29 NOTE — Progress Notes (Signed)
Subjective:    Patient ID: Madison Odom, female    DOB: Jul 20, 1934, 85 y.o.   MRN: [id number]  HPI: Madison Odom is a 85 y.o. female who is here for Transitional Care Visit. She presented to [facility] Emergency Department with complaints of difficulty walking. Emergency Department Physician felt  That the patient was likely have right sided neglect. CT was ordered and Neurology was consulted.  CT Head WO Contrast:  IMPRESSION: Atrophy with small vessel chronic ischemic changes of deep cerebral white matter. Acute/subacute infarct of the RIGHT parietal lobe where loss of gray-white differentiation is seen. CT Cerebral Perfusion W Contrast:  IMPRESSION: 1. Distal right M2 occlusion. 2. Associated right parietal infarct without evidence of significant penumbra by CTP. 3. No significant stenosis in the proximal intracranial or cervical arterial circulation. MRI Brain WO Contrast:  IMPRESSION: 1. Truncated examination due to patient discomfort. 2. Acute ischemic infarct of the posterior right MCA territory, within the right parietal lobe. She was maintained on aspirin and Plavix, instructed to discontinued aspirin after 3 weeks.   She was admitted to [facility] rehabilitation on 01/14/2019 and discharged home on 01/18/2019. Receiving outpatient therapy with [facility] . Madison Odom reports she is dissatisfied with [facility] [employer] [facility], this provider sent an e-mail to [place] [facility] regarding the above, she would like to receive services from [facility]. Denies pain and rates her pain 0. Also reports her appetite is fair, encourage to purchase nutritional supplement, she verbalizes understanding. Also spoke with her daughters and they verbalize understanding.   Arrived to office hypertensive, she states she didn't take her antihypertensive medication this morning, she was rushing she reports. Educated on compliance, she and daughters verbalizes understanding.    Pain Inventory Average Pain 0 Pain Right Now 0 My pain is na  In the last 24 hours, has pain interfered with the following? General activity 0 Relation with others 0 Enjoyment of life 0 What TIME of day is your pain at its worst? na Sleep (in general) Good  Pain is worse with: na Pain improves with: na Relief from Meds: na  Mobility use a walker ability to climb steps?  no do you drive?  no  Function retired I need assistance with the following:  meal prep, household duties and shopping  Neuro/Psych bladder control problems loss of taste or smell  Prior Studies Any changes since last visit?  no  Physicians involved in your care Any changes since last visit?  no   Family History  Problem Relation Age of Onset  . Heart attack Mother   . Hypertension Mother   . Heart disease Father   . Hypertension Father   . Stroke Neg Hx    Social History   Socioeconomic History  . Marital status: Widowed    Spouse name: Not on file  . Number of children: Not on file  . Years of education: Not on file  . Highest education level: Not on file  Occupational History  . Not on file  Social Needs  . Financial resource strain: Not on file  . Food insecurity:    Worry: Not on file    Inability: Not on file  . Transportation needs:    Medical: Not on file    Non-medical: Not on file  Tobacco Use  . Smoking status: Never Smoker  . Smokeless tobacco: Never Used  Substance and Sexual Activity  . Alcohol use: No  . Drug use: No  .  Sexual activity: Not on file  Lifestyle  . Physical activity:    Days per week: Not on file    Minutes per session: Not on file  . Stress: Not on file  Relationships  . Social connections:    Talks on phone: Not on file    Gets together: Not on file    Attends religious service: Not on file    Active member of club or organization: Not on file    Attends meetings of clubs or organizations: Not on file    Relationship status: Not on file   Other Topics Concern  . Not on file  Social History Narrative   1 year of college    Widow   Two daughters    Past Surgical History:  Procedure Laterality Date  . ABDOMINAL HYSTERECTOMY  1965  . APPENDECTOMY    . BILATERAL OOPHORECTOMY  1981  . EYE SURGERY    . LOOP RECORDER INSERTION Left 01/13/2019   Procedure: LOOP RECORDER INSERTION;  Surgeon: Marinus Maw, MD;  Location: [facility] INVASIVE CV LAB;  Service: Cardiovascular;  Laterality: Left;  . ROTATOR CUFF REPAIR Right   . TEE WITHOUT CARDIOVERSION N/A 01/13/2019   Procedure: TRANSESOPHAGEAL ECHOCARDIOGRAM (TEE);  Surgeon: Thurmon Fair, MD;  Location: [facility] ENDOSCOPY;  Service: Cardiovascular;  Laterality: N/A;  loop   Past Medical History:  Diagnosis Date  . Anemia   . GERD (gastroesophageal reflux disease)   . Hyperlipidemia   . Hypertension   . Hypertensive retinopathy   . MVP (mitral valve prolapse)   . OP (osteoporosis)   . Pelvis fracture (HCC) 09/2015   CLOSED   . Scoliosis   . Spastic colon    BP (!) 183/81   Pulse 65   Ht 4' 7.25" (1.403 m)   Wt 76 lb 12.8 oz (34.8 kg)   SpO2 97%   BMI 17.69 kg/m   Opioid Risk Score:   Fall Risk Score:  `1  Depression screen PHQ 2/9  Depression screen [facility] 2/9 01/29/2019 11/29/2017  Decreased Interest 0 0  Down, Depressed, Hopeless 0 0  PHQ - 2 Score 0 0  Altered sleeping 0 -  Tired, decreased energy 1 -  Change in appetite 1 -  Feeling bad or failure about yourself  0 -  Trouble concentrating 0 -  Moving slowly or fidgety/restless 0 -  Suicidal thoughts 0 -  PHQ-9 Score 2 -  Difficult doing work/chores Not difficult at all -    Review of Systems  Constitutional: Positive for appetite change.  HENT: Negative.   Eyes: Negative.   Respiratory: Negative.   Cardiovascular: Negative.   Gastrointestinal: Negative.   Endocrine: Negative.   Genitourinary: Negative.   Musculoskeletal: Negative.   Skin: Negative.   Allergic/Immunologic: Negative.   Neurological:  Negative.   Hematological: Negative.   Psychiatric/Behavioral: Negative.   All other systems reviewed and are negative.      Objective:   Physical Exam Vitals signs and nursing note reviewed.  Constitutional:      Appearance: Normal appearance.  Neck:     Musculoskeletal: Normal range of motion and neck supple.  Cardiovascular:     Rate and Rhythm: Normal rate and regular rhythm.     Pulses: Normal pulses.     Heart sounds: Normal heart sounds.  Pulmonary:     Effort: Pulmonary effort is normal.     Breath sounds: Normal breath sounds.  Musculoskeletal:     Comments: Normal Muscle Bulk and Muscle  Testing Reveals:  Upper Extremities: Full ROM and Muscle Strength 5/5  Lower Extremities: Full ROM and Muscle Strength 5/5 Arises from chair slowly using walker for support Narrow Based Gait   Skin:    General: Skin is warm and dry.  Neurological:     Mental Status: She is alert and oriented to person, place, and time.  Psychiatric:        Mood and Affect: Mood normal.        Behavior: Behavior normal.           Assessment & Plan:  1. Acute ischemic Right MCA Stroke: Continue Out patient Therapy and Continue current medication. She has a follow up appointment with Neurology.  2. Essential Hypertension/ Uncontrolled Hypertension: She didn't take her medication this morning. Educated on Medication  Compliance, she and daughters verbalize understanding.  3. Dyslipidemia: Continue current medication regimen. PCP Following.   30  minutes of face to face patient care time was spent during this visit. All questions were encouraged and answered.  F/U in 4- 6 weeks with Dr. Wynn Banker.

## 2019-02-02 ENCOUNTER — Emergency Department (HOSPITAL_COMMUNITY): Payer: Medicare Other

## 2019-02-02 ENCOUNTER — Observation Stay (HOSPITAL_COMMUNITY)
Admission: EM | Admit: 2019-02-02 | Discharge: 2019-02-05 | Disposition: A | Discharge status: Skilled Nursing Facility | Payer: Medicare Other | Attending: Internal Medicine | Admitting: Internal Medicine

## 2019-02-02 ENCOUNTER — Encounter (HOSPITAL_COMMUNITY): Payer: Self-pay | Admitting: Emergency Medicine

## 2019-02-02 DIAGNOSIS — Z681 Body mass index (BMI) 19 or less, adult: Secondary | ICD-10-CM | POA: Insufficient documentation

## 2019-02-02 DIAGNOSIS — I6389 Other cerebral infarction: Secondary | ICD-10-CM | POA: Insufficient documentation

## 2019-02-02 DIAGNOSIS — E7800 Pure hypercholesterolemia, unspecified: Secondary | ICD-10-CM | POA: Diagnosis not present

## 2019-02-02 DIAGNOSIS — R2681 Unsteadiness on feet: Secondary | ICD-10-CM | POA: Insufficient documentation

## 2019-02-02 DIAGNOSIS — E860 Dehydration: Secondary | ICD-10-CM | POA: Insufficient documentation

## 2019-02-02 DIAGNOSIS — Z7982 Long term (current) use of aspirin: Secondary | ICD-10-CM | POA: Diagnosis not present

## 2019-02-02 DIAGNOSIS — R5381 Other malaise: Secondary | ICD-10-CM | POA: Diagnosis not present

## 2019-02-02 DIAGNOSIS — K219 Gastro-esophageal reflux disease without esophagitis: Secondary | ICD-10-CM | POA: Diagnosis not present

## 2019-02-02 DIAGNOSIS — Z888 Allergy status to other drugs, medicaments and biological substances status: Secondary | ICD-10-CM | POA: Insufficient documentation

## 2019-02-02 DIAGNOSIS — I10 Essential (primary) hypertension: Secondary | ICD-10-CM | POA: Diagnosis not present

## 2019-02-02 DIAGNOSIS — W19XXXA Unspecified fall, initial encounter: Secondary | ICD-10-CM | POA: Diagnosis not present

## 2019-02-02 DIAGNOSIS — D72829 Elevated white blood cell count, unspecified: Secondary | ICD-10-CM | POA: Diagnosis not present

## 2019-02-02 DIAGNOSIS — Z7902 Long term (current) use of antithrombotics/antiplatelets: Secondary | ICD-10-CM | POA: Insufficient documentation

## 2019-02-02 DIAGNOSIS — Z79899 Other long term (current) drug therapy: Secondary | ICD-10-CM | POA: Diagnosis not present

## 2019-02-02 DIAGNOSIS — Z880 Allergy status to penicillin: Secondary | ICD-10-CM | POA: Insufficient documentation

## 2019-02-02 DIAGNOSIS — D509 Iron deficiency anemia, unspecified: Secondary | ICD-10-CM | POA: Diagnosis not present

## 2019-02-02 DIAGNOSIS — R410 Disorientation, unspecified: Secondary | ICD-10-CM | POA: Diagnosis present

## 2019-02-02 DIAGNOSIS — R296 Repeated falls: Secondary | ICD-10-CM | POA: Insufficient documentation

## 2019-02-02 DIAGNOSIS — R269 Unspecified abnormalities of gait and mobility: Secondary | ICD-10-CM | POA: Diagnosis not present

## 2019-02-02 DIAGNOSIS — I959 Hypotension, unspecified: Secondary | ICD-10-CM | POA: Diagnosis present

## 2019-02-02 DIAGNOSIS — I272 Pulmonary hypertension, unspecified: Secondary | ICD-10-CM

## 2019-02-02 DIAGNOSIS — Z8249 Family history of ischemic heart disease and other diseases of the circulatory system: Secondary | ICD-10-CM | POA: Diagnosis not present

## 2019-02-02 DIAGNOSIS — E43 Unspecified severe protein-calorie malnutrition: Secondary | ICD-10-CM | POA: Diagnosis not present

## 2019-02-02 DIAGNOSIS — I639 Cerebral infarction, unspecified: Secondary | ICD-10-CM | POA: Diagnosis present

## 2019-02-02 DIAGNOSIS — Z9181 History of falling: Secondary | ICD-10-CM | POA: Insufficient documentation

## 2019-02-02 DIAGNOSIS — I341 Nonrheumatic mitral (valve) prolapse: Secondary | ICD-10-CM | POA: Diagnosis not present

## 2019-02-02 DIAGNOSIS — F09 Unspecified mental disorder due to known physiological condition: Secondary | ICD-10-CM | POA: Insufficient documentation

## 2019-02-02 DIAGNOSIS — G8324 Monoplegia of upper limb affecting left nondominant side: Secondary | ICD-10-CM | POA: Insufficient documentation

## 2019-02-02 DIAGNOSIS — Z881 Allergy status to other antibiotic agents status: Secondary | ICD-10-CM | POA: Insufficient documentation

## 2019-02-02 DIAGNOSIS — M6281 Muscle weakness (generalized): Secondary | ICD-10-CM | POA: Insufficient documentation

## 2019-02-02 DIAGNOSIS — G9341 Metabolic encephalopathy: Secondary | ICD-10-CM | POA: Diagnosis not present

## 2019-02-02 HISTORY — DX: Cerebral infarction, unspecified: I63.9

## 2019-02-02 LAB — BASIC METABOLIC PANEL
Anion gap: 10 (ref 5–15)
BUN: 21 mg/dL (ref 8–23)
CO2: 23 mmol/L (ref 22–32)
Calcium: 10.4 mg/dL — ABNORMAL HIGH (ref 8.9–10.3)
Chloride: 108 mmol/L (ref 98–111)
Creatinine, Ser: 0.76 mg/dL (ref 0.44–1.00)
GFR calc Af Amer: >60 mL/min (ref >60)
GFR calc non Af Amer: >60 mL/min (ref >60)
Glucose, Bld: 150 mg/dL — ABNORMAL HIGH (ref 70–99)
POTASSIUM: 4.2 mmol/L (ref 3.5–5.1)
Sodium: 141 mmol/L (ref 135–145)

## 2019-02-02 MED ORDER — BACITRACIN ZINC 500 UNIT/GM EX OINT
TOPICAL_OINTMENT | Freq: Once | CUTANEOUS | Status: AC
  Administered 2019-02-03: 1 via TOPICAL
  Filled 2019-02-02: qty 0.9

## 2019-02-02 MED ORDER — SODIUM CHLORIDE 0.9 % IV BOLUS
500.0000 mL | Freq: Once | INTRAVENOUS | Status: AC
  Administered 2019-02-02: 500 mL via INTRAVENOUS

## 2019-02-02 MED ORDER — ONDANSETRON HCL 4 MG/2ML IJ SOLN
INTRAMUSCULAR | Status: AC
  Administered 2019-02-02: 4 mg
  Filled 2019-02-02: qty 2

## 2019-02-02 NOTE — ED Triage Notes (Signed)
Brought by ems from home after having an unwitnessed fall.  Found in front of the recliner with left arm over head rotated backwards.  Per ems no pulse initially in the left arm but came back after bringing arm back down to side of body.  Per patient she may have hit her head and she is on blood thinners.  Given fentanyl total en route.  Patient very somnolent at this time.

## 2019-02-02 NOTE — ED Notes (Signed)
In and out cath performed without success.  Very small amount of urine in tubing only.  Per daughter patient does not drink much at all.  Physician made aware.

## 2019-02-02 NOTE — ED Provider Notes (Signed)
[facility] EMERGENCY DEPARTMENT Provider Note   CSN: [id number] Arrival date & time: 02/02/19  2209    History   Chief Complaint Chief Complaint  Patient presents with   Fall    HPI Madison Odom is a 85 y.o. female possible history of GERD, hyperlipidemia, hypertension, MVP, recent CVA who presents via EMS for evaluation of unwitnessed fall.  Per daughter, she last saw patient about 6:45 PM when she dropped some food off at her house.  She states that she was at her baseline.  Daughter does report that since the stroke, she has had a little bit more issues with balance but states that she was acting appropriately.  They tried to call her again in approximately 8:30 PM this evening and were unable to get in touch with her.  Patient's other daughter went over to the house and found her on the floor.  Unsure of how long she had been there.  Patient stated that she had been trying to get onto the chair.  Per EMS report, patient was found on the floor with arm left arm up over her head.  On initial EMS arrival, they mention that they did not find a pulse.  They brought the arm back down and patient had good pulse.  She was given 150 mcg of fentanyl in route.  EMS LEVEL 5 CAVEAT DUE TO AMS     The history is provided by the patient.    Past Medical History:  Diagnosis Date   Anemia    GERD (gastroesophageal reflux disease)    Hyperlipidemia    Hypertension    Hypertensive retinopathy    MVP (mitral valve prolapse)    OP (osteoporosis)    Pelvis fracture (HCC) 09/2015   CLOSED    Scoliosis    Spastic colon    Stroke [facility])    left side neglect    Patient Active Problem List   Diagnosis Date Noted   Iron deficiency anemia 01/15/2019   Acute ischemic right MCA stroke (HCC) 01/14/2019   Acute CVA (cerebrovascular accident) (HCC) 01/09/2019   Epiretinal membrane, left eye 04/18/2017   Acute blood loss anemia 09/27/2015   Pelvic fracture (HCC)  09/21/2015   Closed fracture of pelvis (HCC)    Hypertensive retinopathy of both eyes 08/16/2015   Hypertension 01/28/2014   Kyphoscoliosis 09/16/2013   After cataract 02/19/2013   Pseudophakia of both eyes 01/22/2013   Posterior capsular opacification 10/29/2012   Cystoid macular edema 04/30/2012   Epiretinal membrane 04/30/2012   MVP (mitral valve prolapse) 05/24/2011   Hypercholesterolemia 05/24/2011    Past Surgical History:  Procedure Laterality Date   ABDOMINAL HYSTERECTOMY  1965   APPENDECTOMY     BILATERAL OOPHORECTOMY  1981   EYE SURGERY     LOOP RECORDER INSERTION Left 01/13/2019   Procedure: LOOP RECORDER INSERTION;  Surgeon: Marinus Maw, MD;  Location: [facility] INVASIVE CV LAB;  Service: Cardiovascular;  Laterality: Left;   ROTATOR CUFF REPAIR Right    TEE WITHOUT CARDIOVERSION N/A 01/13/2019   Procedure: TRANSESOPHAGEAL ECHOCARDIOGRAM (TEE);  Surgeon: Thurmon Fair, MD;  Location: [facility] ENDOSCOPY;  Service: Cardiovascular;  Laterality: N/A;  loop     OB History   No obstetric history on file.      Home Medications    Prior to Admission medications   Medication Sig Start Date End Date Taking? Authorizing Provider  acetaminophen (TYLENOL) 325 MG tablet Take 1-2 tablets (325-650 mg total) by mouth every 4 (  four) hours as needed for mild pain. 01/17/19  Yes Love, Evlyn Kanner, PA-C  Calcium Carb-Cholecalciferol (CALCIUM 500 +D PO) Take 1 tablet by mouth daily.    Yes [provider]  clopidogrel (PLAVIX) 75 MG tablet Take 1 tablet (75 mg total) by mouth daily. 01/17/19  Yes Love, Evlyn Kanner, PA-C  ferrous sulfate 325 (65 FE) MG tablet Take 1 tablet (325 mg total) by mouth daily at 12 noon. 01/17/19  Yes Love, Evlyn Kanner, PA-C  Multiple Vitamin (MULTIVITAMIN) tablet Take 1 tablet by mouth daily.   Yes [provider]  nadolol (CORGARD) 40 MG tablet Take 1 tablet (40 mg total) by mouth daily. 01/17/19  Yes Love, Evlyn Kanner, PA-C  pantoprazole (PROTONIX) 40  MG tablet Take 1 tablet (40 mg total) by mouth daily. 01/18/19  Yes Love, Evlyn Kanner, PA-C  simvastatin (ZOCOR) 40 MG tablet Take 1 tablet (40 mg total) by mouth daily at 6 PM. 01/17/19  Yes Love, Evlyn Kanner, PA-C  aspirin 81 MG EC tablet Take 1 tablet (81 mg total) by mouth daily. Continue for 2 more weeks then stop. Patient not taking: Reported on 02/02/2019 01/17/19   Love, Evlyn Kanner, PA-C    Family History Family History  Problem Relation Age of Onset   Heart attack Mother    Hypertension Mother    Heart disease Father    Hypertension Father    Stroke Neg Hx     Social History Social History   Tobacco Use   Smoking status: Never Smoker   Smokeless tobacco: Never Used  Substance Use Topics   Alcohol use: No   Drug use: No     Allergies   Phenothiazines; Prochlorperazine; Benadryl [diphenhydramine]; Ferrous sulfate; Penicillins; and Tetracycline   Review of Systems Review of Systems  All other systems reviewed and are negative.    Physical Exam Updated Vital Signs BP (!) 91/57    Pulse 75    Temp 98 F (36.7 C) (Oral)    Resp 16    Ht 4' 7.25" (1.403 m)    Wt 34.8 kg    SpO2 95%    BMI 17.67 kg/m   Physical Exam Vitals signs and nursing note reviewed.  Constitutional:      Appearance: Normal appearance. She is well-developed.  HENT:     Head: Normocephalic and atraumatic.     Comments: No tenderness to palpation of skull. No deformities or crepitus noted. No open wounds, abrasions or lacerations.  Eyes:     General: Lids are normal.     Extraocular Movements:     Right eye: Abnormal extraocular motion present.     Left eye: Abnormal extraocular motion present.     Conjunctiva/sclera: Conjunctivae normal.     Pupils: Pupils are equal, round, and reactive to light.     Comments: Left-sided neglect present. PERRL.  Neck:     Musculoskeletal: Full passive range of motion without pain.  Cardiovascular:     Rate and Rhythm: Normal rate and regular rhythm.      Pulses: Normal pulses.          Radial pulses are 2+ on the right side and 2+ on the left side.       Dorsalis pedis pulses are 2+ on the right side and 2+ on the left side.     Heart sounds: Normal heart sounds. No murmur. No friction rub. No gallop.   Pulmonary:     Effort: Pulmonary effort is normal.  Breath sounds: Normal breath sounds.     Comments: Lungs clear to auscultation bilaterally.  Symmetric chest rise.  No wheezing, rales, rhonchi. Abdominal:     Palpations: Abdomen is soft. Abdomen is not rigid.     Tenderness: There is no abdominal tenderness. There is no guarding.  Musculoskeletal: Normal range of motion.     Comments: Pelvic instability noted.  No tenderness palpation noted to bilateral hips, bilateral knees, bilateral tib-fib, ankle.  Tenderness palpation noted to left shoulder.  No deformity or crepitus noted.  Tenderness palpation of the left elbow.  No deformity or crepitus noted.  Limited range of motion secondary to pain.  No tenderness palpation of the right upper extremity.  Skin:    General: Skin is warm and dry.     Capillary Refill: Capillary refill takes less than 2 seconds.     Comments: Skin tear noted to the dorsal aspect of the left hand. Good distal cap refill.  LUE is not dusky in appearance or cool to touch.  Neurological:     Mental Status: She is alert.     Comments: Left side neglect present. No evidence facial droop.  No slurred speech. Alert and oriented x1.  Does not answer where she is or what year it is. Unable to assess strength of left upper extremity secondary to pain.  Normal strength of right upper extremity.  Good grip strength of right hand.  Psychiatric:        Speech: Speech normal.      ED Treatments / Results  Labs (all labs ordered are listed, but only abnormal results are displayed) Labs Reviewed  BASIC METABOLIC PANEL - Abnormal; Notable for the following components:      Result Value   Glucose, Bld 150 (*)    Calcium  10.4 (*)    All other components within normal limits  CBC WITH DIFFERENTIAL/PLATELET - Abnormal; Notable for the following components:   WBC 19.6 (*)    Hemoglobin 11.3 (*)    MCH 23.3 (*)    MCHC 28.3 (*)    RDW 29.2 (*)    Neutro Abs 16.2 (*)    Monocytes Absolute 1.8 (*)    Abs Immature Granulocytes 0.11 (*)    All other components within normal limits  URINALYSIS, ROUTINE W REFLEX MICROSCOPIC    EKG EKG Interpretation  Date/Time:  Sunday February 02 2019 23:18:42 EDT Ventricular Rate:  77 PR Interval:    QRS Duration: 80 QT Interval:  407 QTC Calculation: 461 R Axis:   96 Text Interpretation:  Sinus arrhythmia Prolonged PR interval Right axis deviation Confirmed by Gilda Crease [phone]) on 02/02/2019 11:36:09 PM   Radiology Dg Elbow Complete Left  Result Date: 02/02/2019 CLINICAL DATA:  Post unwitnessed fall at home. EXAM: LEFT ELBOW - COMPLETE 3+ VIEW COMPARISON:  None. FINDINGS: The bones are under mineralized. There is no evidence of fracture, dislocation, or joint effusion. There is no evidence of arthropathy or other focal bone abnormality. Soft tissues are unremarkable. IMPRESSION: No fracture or subluxation of the left elbow. Electronically Signed   By: Narda Rutherford M.D.   On: 02/02/2019 23:04   Ct Head Wo Contrast  Result Date: 02/02/2019 CLINICAL DATA:  85 year old female with unwitnessed fall. On blood thinners. Right M2 occlusion with posterior right MCA infarct last month. EXAM: CT HEAD WITHOUT CONTRAST TECHNIQUE: Contiguous axial images were obtained from the base of the skull through the vertex without intravenous contrast. COMPARISON:  Brain MRI 01/10/2019.  Head CT 01/09/2019. FINDINGS: Brain: Developing encephalomalacia in the posterior right MCA territory on series 3, image 30. No malignant hemorrhagic transformation or mass effect. Questionable petechial hemorrhage on series 3, image 24. Elsewhere Stable gray-white matter differentiation throughout  the brain. Patchy white matter hypodensity. No midline shift, ventriculomegaly, mass effect, evidence of mass lesion, intracranial hemorrhage or new cortically based acute infarction. Vascular: Mild Calcified atherosclerosis at the skull base. No suspicious intracranial vascular hyperdensity. Skull: Stable and intact. Osteopenia. Sinuses/Orbits: Chronic right maxillary and left sphenoid sinus disease. Trace low-density fluid in the right sphenoid is new. Other visualized paranasal sinuses and mastoids are stable and well pneumatized. Other: No acute orbit or scalp soft tissue finding. IMPRESSION: 1. No acute intracranial abnormality or acute traumatic injury identified. 2. Expected evolution of the posterior right MCA territory infarct since February. Electronically Signed   By: Odessa Fleming M.D.   On: 02/02/2019 22:44   Dg Shoulder Left  Result Date: 02/02/2019 CLINICAL DATA:  Post unwitnessed fall at home. EXAM: LEFT SHOULDER - 2+ VIEW COMPARISON:  None. FINDINGS: The bones are under mineralized. Glenohumeral alignment is not well assessed due to positioning. No acute fracture. Acromioclavicular joint is congruent. IMPRESSION: Glenohumeral alignment is not well assessed due to positioning. No acute fracture. Electronically Signed   By: Narda Rutherford M.D.   On: 02/02/2019 23:03    Procedures Procedures (including critical care time)  Medications Ordered in ED Medications  ondansetron (ZOFRAN) 4 MG/2ML injection (4 mg  Given 02/02/19 2250)  bacitracin ointment (1 application Topical Given 02/03/19 0104)  sodium chloride 0.9 % bolus 500 mL (500 mLs Intravenous New Bag/Given 02/02/19 2350)     Initial Impression / Assessment and Plan / ED Course  I have reviewed the triage vital signs and the nursing notes.  Pertinent labs & imaging results that were available during my care of the patient were reviewed by me and considered in my medical decision making (see chart for details).        85 year old  female who presents for evaluation of unwitnessed fall.  Patient brought in by EMS.  There initial report states that left upper extremity was up above her head and that she had no pulse.  They moved her arm down towards her side and she had pulse.  Patient was given 150 mcg of fentanyl in route.  On my initial ED arrival, patient is lethargic, she does not respond to verbal stimuli.  She groans with sternal rub.  She presents left-sided neglect and is barely able to follow commands.  Patient with good radial pulses bilaterally.  Left upper extremity is not dusky in appearance or cool to touch.  Patient is on blood thinners and had recent stroke about 2 weeks ago.  Given change in mental status, patient was rushed to head CT for evaluation of any acute intracranial abnormality.  Daughter at bedside states that left-sided neglect has been present since stroke though she had noted some improvement.  She states since the stroke, patient has had some cognitive/memory impairment.  She last saw her today about 6:45 PM and states that she was at her baseline.  CT negative for any acute intracranial abnormality.  X-ray of left upper extremity reviewed.  No evidence of acute fracture or dislocation.  Re-evaluation.  Patient is somewhat more alert but still not back to baseline per daughter.  She still appears groggy and has evidence of left-sided neglect.  She is able to follow commands and I am able  to get a better neuro exam she still doesn't move most of her LUE.  We will plan to check labs, urine, EKG for evaluation of unwitnessed fall.  Patient signed out to [employer], [place]. Please see his note for further evaluation.   Final Clinical Impressions(s) / ED Diagnoses   Final diagnoses:  None    ED Discharge Orders    None       Maxwell Caul, PA-C 02/03/19 0110    Gilda Crease, MD 02/03/19 [phone]

## 2019-02-03 ENCOUNTER — Emergency Department (HOSPITAL_COMMUNITY): Payer: Medicare Other

## 2019-02-03 ENCOUNTER — Encounter: Payer: Medicare Other | Admitting: Physical Therapy

## 2019-02-03 ENCOUNTER — Other Ambulatory Visit: Payer: Self-pay

## 2019-02-03 DIAGNOSIS — E7800 Pure hypercholesterolemia, unspecified: Secondary | ICD-10-CM

## 2019-02-03 DIAGNOSIS — I959 Hypotension, unspecified: Secondary | ICD-10-CM | POA: Diagnosis present

## 2019-02-03 DIAGNOSIS — G9341 Metabolic encephalopathy: Secondary | ICD-10-CM | POA: Diagnosis present

## 2019-02-03 DIAGNOSIS — I639 Cerebral infarction, unspecified: Secondary | ICD-10-CM

## 2019-02-03 DIAGNOSIS — D72829 Elevated white blood cell count, unspecified: Secondary | ICD-10-CM | POA: Diagnosis present

## 2019-02-03 DIAGNOSIS — W19XXXA Unspecified fall, initial encounter: Secondary | ICD-10-CM | POA: Diagnosis not present

## 2019-02-03 DIAGNOSIS — D509 Iron deficiency anemia, unspecified: Secondary | ICD-10-CM

## 2019-02-03 LAB — CBC WITH DIFFERENTIAL/PLATELET
Abs Immature Granulocytes: 0.11 10*3/uL — ABNORMAL HIGH (ref 0.00–0.07)
Basophils Absolute: 0.1 10*3/uL (ref 0.0–0.1)
Basophils Relative: 0 %
Eosinophils Absolute: 0.1 10*3/uL (ref 0.0–0.5)
Eosinophils Relative: 1 %
HCT: 40 % (ref 36.0–46.0)
Hemoglobin: 11.3 g/dL — ABNORMAL LOW (ref 12.0–15.0)
Immature Granulocytes: 1 %
LYMPHS ABS: 1.3 10*3/uL (ref 0.7–4.0)
Lymphocytes Relative: 7 %
MCH: 23.3 pg — ABNORMAL LOW (ref 26.0–34.0)
MCHC: 28.3 g/dL — ABNORMAL LOW (ref 30.0–36.0)
MCV: 82.6 fL (ref 80.0–100.0)
MONO ABS: 1.8 10*3/uL — AB (ref 0.1–1.0)
Monocytes Relative: 9 %
Neutro Abs: 16.2 10*3/uL — ABNORMAL HIGH (ref 1.7–7.7)
Neutrophils Relative %: 82 %
Platelets: 286 10*3/uL (ref 150–400)
RBC: 4.84 MIL/uL (ref 3.87–5.11)
RDW: 29.2 % — ABNORMAL HIGH (ref 11.5–15.5)
WBC: 19.6 10*3/uL — ABNORMAL HIGH (ref 4.0–10.5)
nRBC: 0 % (ref 0.0–0.2)

## 2019-02-03 LAB — BASIC METABOLIC PANEL
Anion gap: 9 (ref 5–15)
BUN: 19 mg/dL (ref 8–23)
CALCIUM: 9.5 mg/dL (ref 8.9–10.3)
CO2: 26 mmol/L (ref 22–32)
Chloride: 107 mmol/L (ref 98–111)
Creatinine, Ser: 0.61 mg/dL (ref 0.44–1.00)
GFR calc Af Amer: >60 mL/min (ref >60)
GFR calc non Af Amer: >60 mL/min (ref >60)
Glucose, Bld: 100 mg/dL — ABNORMAL HIGH (ref 70–99)
Potassium: 4.1 mmol/L (ref 3.5–5.1)
Sodium: 142 mmol/L (ref 135–145)

## 2019-02-03 LAB — URINALYSIS, ROUTINE W REFLEX MICROSCOPIC
Bacteria, UA: NONE SEEN
Bilirubin Urine: NEGATIVE
Glucose, UA: NEGATIVE mg/dL
Hgb urine dipstick: NEGATIVE
Ketones, ur: NEGATIVE mg/dL
Leukocytes,Ua: NEGATIVE
Nitrite: NEGATIVE
Protein, ur: 30 mg/dL — AB
Specific Gravity, Urine: 1.012 (ref 1.005–1.030)
pH: 6 (ref 5.0–8.0)

## 2019-02-03 LAB — CBC
HCT: 34.4 % — ABNORMAL LOW (ref 36.0–46.0)
Hemoglobin: 10 g/dL — ABNORMAL LOW (ref 12.0–15.0)
MCH: 23.1 pg — ABNORMAL LOW (ref 26.0–34.0)
MCHC: 29.1 g/dL — ABNORMAL LOW (ref 30.0–36.0)
MCV: 79.4 fL — ABNORMAL LOW (ref 80.0–100.0)
Platelets: 216 10*3/uL (ref 150–400)
RBC: 4.33 MIL/uL (ref 3.87–5.11)
RDW: 28.7 % — ABNORMAL HIGH (ref 11.5–15.5)
WBC: 15.6 10*3/uL — ABNORMAL HIGH (ref 4.0–10.5)
nRBC: 0 % (ref 0.0–0.2)

## 2019-02-03 LAB — LACTIC ACID, PLASMA
LACTIC ACID, VENOUS: 1.9 mmol/L (ref 0.5–1.9)
Lactic Acid, Venous: 2.6 mmol/L (ref 0.5–1.9)

## 2019-02-03 LAB — PROCALCITONIN: Procalcitonin: 0.12 ng/mL

## 2019-02-03 MED ORDER — ACETAMINOPHEN 325 MG PO TABS: 650.0000 mg | ORAL_TABLET | Freq: Four times a day (QID) | ORAL | Status: DC | PRN

## 2019-02-03 MED ORDER — SODIUM CHLORIDE 0.9 % IV BOLUS
500.0000 mL | Freq: Once | INTRAVENOUS | Status: AC
  Administered 2019-02-03: 500 mL via INTRAVENOUS

## 2019-02-03 MED ORDER — CLOPIDOGREL BISULFATE 75 MG PO TABS
75.0000 mg | ORAL_TABLET | Freq: Every day | ORAL | Status: DC
  Administered 2019-02-03 – 2019-02-05 (×3): 75 mg via ORAL
  Filled 2019-02-03 (×3): qty 1

## 2019-02-03 MED ORDER — ACETAMINOPHEN 650 MG RE SUPP: 650.0000 mg | Freq: Four times a day (QID) | RECTAL | Status: DC | PRN

## 2019-02-03 MED ORDER — FAMOTIDINE 20 MG IN NS 100 ML IVPB
20.0000 mg | INTRAVENOUS | Status: DC
  Administered 2019-02-03 – 2019-02-04 (×2): 20 mg via INTRAVENOUS
  Filled 2019-02-03 (×3): qty 100

## 2019-02-03 MED ORDER — FERROUS SULFATE 325 (65 FE) MG PO TABS
325.0000 mg | ORAL_TABLET | Freq: Every day | ORAL | Status: DC
  Administered 2019-02-03 – 2019-02-05 (×3): 325 mg via ORAL
  Filled 2019-02-03 (×3): qty 1

## 2019-02-03 MED ORDER — SODIUM CHLORIDE 0.9 % IV SOLN
INTRAVENOUS | Status: DC
  Administered 2019-02-03: 1000 mL via INTRAVENOUS
  Administered 2019-02-03 (×2): via INTRAVENOUS

## 2019-02-03 MED ORDER — ENOXAPARIN SODIUM 300 MG/3ML IJ SOLN
15.0000 mg | INTRAMUSCULAR | Status: DC
  Administered 2019-02-03 – 2019-02-04 (×2): 15 mg via SUBCUTANEOUS
  Filled 2019-02-03 (×3): qty 0.15

## 2019-02-03 MED ORDER — SIMVASTATIN 20 MG PO TABS
40.0000 mg | ORAL_TABLET | Freq: Every day | ORAL | Status: DC
  Administered 2019-02-03 – 2019-02-04 (×2): 40 mg via ORAL
  Filled 2019-02-03 (×2): qty 2

## 2019-02-03 MED ORDER — PANTOPRAZOLE SODIUM 40 MG PO TBEC
40.0000 mg | DELAYED_RELEASE_TABLET | Freq: Every day | ORAL | Status: DC
  Administered 2019-02-03 – 2019-02-05 (×3): 40 mg via ORAL
  Filled 2019-02-03 (×3): qty 1

## 2019-02-03 MED ORDER — HYDRALAZINE HCL 20 MG/ML IJ SOLN: 5.0000 mg | INTRAMUSCULAR | Status: DC | PRN

## 2019-02-03 MED ORDER — ONDANSETRON HCL 4 MG/2ML IJ SOLN: 4.0000 mg | Freq: Three times a day (TID) | INTRAMUSCULAR | Status: DC | PRN

## 2019-02-03 NOTE — ED Provider Notes (Signed)
Patient signed out to me at shift change.  Patient sustained a presumed mechanical fall at home.  She lives at home alone.  EMS was called to the scene and she was complaining of arm pain.  She was given 150 mcg of fentanyl.  Her daughter states that she has not been acting herself since having her prior stroke.  Her blood pressures have been soft in the emergency department despite giving fluids.  She does have a leukocytosis to 19 without clear explanation.  Her urine is negative for evidence of infection.  Chest x-ray shows bibasilar atelectasis.  Patient has not had cough or fever.    Appreciate Dr. Clyde Lundborg for bringing the patient in for observation.   Roxy Horseman, PA-C 02/03/19 0350    Gilda Crease, MD 02/03/19 [phone]

## 2019-02-03 NOTE — Progress Notes (Signed)
02/03/19 1719  PT Visit Information  Last PT Received On 02/03/19  Assistance Needed +2  PT/OT/SLP Co-Evaluation/Treatment Yes  Reason for Co-Treatment To address functional/ADL transfers;For patient/therapist safety  PT goals addressed during session Mobility/safety with mobility;Balance  History of Present Illness Madison A Crossis a 85 y.o.femalewith medical history significant ofhypertension, hyperlipidemia, iron deficiency anemia, MVP, scoliosis, GERD, recent admission due to right MCA stroke, who presents with fall and altered mental status. Patient was recently hospitalized from 2/27-3/3 due to right MCA stroke, then completed rehab,discharged home on 3/7. CT head without acute intracranial abnormality or acute traumatic injury identified. Imaging of shoulder and elbow shows no acute fracture, imaging of chest with suspected remote lower L rib fractures.   Precautions  Precautions Fall  Restrictions  Weight Bearing Restrictions No  Home Living  Family/patient expects to be discharged to: Private residence  Living Arrangements Alone  Available Help at Discharge Family;Available PRN/intermittently  Type of Home House  Home Access Stairs to enter  Entrance Stairs-Rails None  Home Layout Two level;Other (Comment) (has a chair lift)  Health and safety inspector - 4 wheels;Shower seat;Grab bars - tub/shower  Additional Comments PLOF obtained from most recent admission; per chart pt has 2 4 wheeled walkers one upstairs and one downstairs  Prior Function  Level of Independence Independent with assistive device(s)  Comments reports she uses rollator, reports completing ADL without assist and reports family checks in 2-3 times/day; question pt accuracy of report as pt denies recent falls  Communication  Communication No difficulties  Pain Assessment  Pain Assessment Faces  Faces Pain Scale 6  Pain Location L side/ribcage, LUE   Pain Descriptors / Indicators Discomfort;Grimacing;Sore  Pain Intervention(s) Limited activity within patient's tolerance;Monitored during session;Repositioned  Cognition  Arousal/Alertness Lethargic;Awake/alert  Behavior During Therapy [facility] for tasks assessed/performed  Overall Cognitive Status Impaired/Different from baseline  Area of Impairment Memory;Following commands;Safety/judgement;Awareness;Problem solving  Memory Decreased short-term memory  Following Commands Follows one step commands with increased time;Follows one step commands inconsistently  Safety/Judgement Decreased awareness of deficits  Awareness Intellectual  Problem Solving Slow processing;Decreased initiation;Difficulty sequencing;Requires verbal cues;Requires tactile cues  General Comments pt noted to have STM deficits, decreased insight into current deficits, denies recent falls though per chart pt with unwitnessed fall PTA; pt requires mulitmodal cues to follow one step commands  Upper Extremity Assessment  Upper Extremity Assessment Defer to OT evaluation  Lower Extremity Assessment  Lower Extremity Assessment Generalized weakness  Cervical / Trunk Assessment  Cervical / Trunk Assessment Kyphotic (h/o scoliosis)  Bed Mobility  Overal bed mobility Needs Assistance  Bed Mobility Supine to Sit  Supine to sit Max assist;+2 for physical assistance;+2 for safety/equipment  General bed mobility comments pt requires verbal/tactile cues to initiate moving LEs towards EOB - requires physical assist to fully complete, assist to elevate trunk and use of bed pad to scoot hips towards EOB  Transfers  Overall transfer level Needs assistance  Equipment used 2 person hand held assist  Transfers Sit to/from [employer] Transfers  Sit to Stand Max assist;+2 physical assistance;+2 safety/equipment  Stand pivot transfers Max assist;+2 physical assistance;+2 safety/equipment  General transfer comment pt requires two person  assist via face to face method to rise from EOB, use of bed pad for additional support/stability at hips; pt with painful L side/UE during transitions - unable to successfully step towards recliner requiring physical assist to transition and turn hips/LEs towards recliner   Balance  Overall balance assessment Needs assistance  Sitting-balance support Feet supported  Sitting balance-Leahy Scale Poor  Sitting balance - Comments pt requires at least modA to maintain sitting balance EOB; L lateral lean noted  Postural control Left lateral lean  Standing balance support Bilateral upper extremity supported  Standing balance-Leahy Scale Poor  Standing balance comment reliant on UE/external support  General Comments  General comments (skin integrity, edema, etc.) NT present end of session taking vitals, VSS up in recliner with pt on 3L O2  PT - End of Session  Equipment Utilized During Treatment Gait belt  Activity Tolerance Patient limited by pain  Patient left in chair;with call bell/phone within reach;with chair alarm set;with nursing/sitter in room  Nurse Communication Mobility status  PT Assessment  PT Recommendation/Assessment Patient needs continued PT services  PT Visit Diagnosis Unsteadiness on feet (R26.81);Muscle weakness (generalized) (M62.81);History of falling (Z91.81);Difficulty in walking, not elsewhere classified (R26.2);Pain  Pain - Right/Left Left  Pain - part of body  (ribs)  PT Problem List Decreased strength;Decreased activity tolerance;Decreased balance;Decreased mobility;Decreased cognition;Decreased knowledge of use of DME;Decreased safety awareness;Decreased knowledge of precautions;Pain  PT Plan  PT Frequency (ACUTE ONLY) Min 2X/week  PT Treatment/Interventions (ACUTE ONLY) DME instruction;Gait training;Functional mobility training;Therapeutic activities;Therapeutic exercise;Balance training;Patient/family education;Cognitive remediation  AM-PAC PT "6 Clicks" Mobility  Outcome Measure (Version 2)  Help needed turning from your back to your side while in a flat bed without using bedrails? 1  Help needed moving from lying on your back to sitting on the side of a flat bed without using bedrails? 1  Help needed moving to and from a bed to a chair (including a wheelchair)? 1  Help needed standing up from a chair using your arms (e.g., wheelchair or bedside chair)? 1  Help needed to walk in hospital room? 1  Help needed climbing 3-5 steps with a railing?  1  6 Click Score 6  Consider Recommendation of Discharge To: CIR/SNF/LTACH  PT Recommendation  Follow Up Recommendations SNF;Supervision/Assistance - 24 hour  PT equipment None recommended by PT  Individuals Consulted  Consulted and Agree with Results and Recommendations Patient  Acute Rehab PT Goals  Patient Stated Goal less pain  PT Goal Formulation With patient  Time For Goal Achievement 02/17/19  Potential to Achieve Goals Fair  PT Time Calculation  PT Start Time (ACUTE ONLY) 1545  PT Stop Time (ACUTE ONLY) 1603  PT Time Calculation (min) (ACUTE ONLY) 18 min  PT General Charges  $$ ACUTE PT VISIT 1 Visit  PT Evaluation  $PT Eval Moderate Complexity 1 Mod  Written Expression  Dominant Hand Right   Pt admitted secondary to problem above with deficits above. Pt presenting with cognitive deficits, L rib pain, and generalized weakness. Required max A +2 for mobility tasks this session. Pt with difficulty taking steps towards chair, so further mobility deferred. Pt currently lives alone and is at high fall risk. Recommending SNF level therapy at d/c. Will continue to follow acutely to maximize functional mobility independence and safety.   Gladys Damme, PT, DPT  Acute Rehabilitation Services  Pager: [phone] Office: [phone]

## 2019-02-03 NOTE — H&P (Signed)
History and Physical    Madison Odom VZC:[id number] DOB: [birth year] DOA: 02/02/2019  Referring MD/NP/PA:   PCP: [name], NP   Patient coming from:  The patient is coming from home.  At baseline, pt is partially dependent for most of ADL.        Chief Complaint: fall and AMS  HPI: Madison Odom is a 85 y.o. female with medical history significant of hypertension, hyperlipidemia, iron deficiency anemia, MVP, scoliosis, GERD, recent admission due to right MCA stroke, who presents with fall and altered mental status.  Patient was recently hospitalized from 2/27- 3/3 due to right MCA stroke, then completed rehab, discharged home on 3/7.  Per her daughter, patient did not fully recover from stroke.  She still has left arm weakness.  She has confusion at baseline, but recognizes her daughter, most of time is not orientated to time and place.  Per pt's daughter, patient was last seen at about 6:45 PM, but when they tried to call her again in approximately 8:30 PM, got no response. Patient's other daughter went over to the house and found her on the floor.  Unsure of how long she had been there.  Per EMS report, patient was found on the floor with arm left arm up over her head.  On initial EMS arrival, they mention that they did not find a pulse.  They brought the arm back down and patient had good pulse.  She was given 150 mcg of fentanyl in route. Her initial blood pressure was 81/58 in ED, which improved to 99/52 after giving 500 cc normal saline x2.  Patient had oxygen desaturation to 77% initially, which improved to 100% on 2 L nasal cannula oxygen.  When I saw patient in the ED, she is barely arousable.  I removed nasal cannula oxygen during the talk with her daughter, she did not have oxygen desaturation.  Her oxygenation has been above 96%.  Per her daughter, patient does not seem to have chest pain or abdominal pain.  No active cough, respiratory distress, nausea vomiting, diarrhea noted.   Not sure if patient has symptoms of UTI.   ED Course: pt was found to have WBC 19.6, negative urinalysis, chest x-ray showed cardiomegaly and vascular congestion without pulmonary edema or infiltration.  Temperature normal, heart rate 48-->105-->62.  CT head is negative for acute intracranial abnormalities, but showed previous right MCA infarction.  X-ray of left elbow and left shoulder did not show bony fracture.  Patient is placed on telemetry bed for observation.  # X-ray of left shoulder: Glenohumeral alignment is not well assessed due to positioning. No acute fracture  Review of Systems: Cannot be reviewed due to altered mental status.   Allergy:  Allergies  Allergen Reactions   Phenothiazines Other (See Comments) and Anaphylaxis    Unknown reaction   Prochlorperazine Anaphylaxis   Benadryl [Diphenhydramine]     Agitation , confusion    Ferrous Sulfate     Abdominal pain/diarrhea   Penicillins     Yeast infections Did it involve swelling of the face/tongue/throat, SOB, or low BP? No Did it involve sudden or severe rash/hives, skin peeling, or any reaction on the inside of your mouth or nose? No Did you need to seek medical attention at a hospital or doctor's office? No When did it last happen?30 yrs If all above answers are NO, may proceed with cephalosporin use.    Tetracycline     Other reaction(s): Other (See Comments) Yeast infection  Past Medical History:  Diagnosis Date   Anemia    GERD (gastroesophageal reflux disease)    Hyperlipidemia    Hypertension    Hypertensive retinopathy    MVP (mitral valve prolapse)    OP (osteoporosis)    Pelvis fracture (HCC) 09/2015   CLOSED    Scoliosis    Spastic colon    Stroke (HCC)    left side neglect    Past Surgical History:  Procedure Laterality Date   ABDOMINAL HYSTERECTOMY  1965   APPENDECTOMY     BILATERAL OOPHORECTOMY  1981   EYE SURGERY     LOOP RECORDER INSERTION Left  01/13/2019   Procedure: LOOP RECORDER INSERTION;  Surgeon: Marinus Maw, MD;  Location: [facility]AS[facility];  Service: Cardiovascular;  Laterality: Left;   ROTATOR CUFF REPAIR Right    TEE WITHOUT CARDIOVERSION N/A 01/13/2019   Procedure: TRANSESOPHAGEAL ECHOCARDIOGRAM (TEE);  Surgeon: Thurmon Fair, MD;  Location: [facility] ENDOSCOPY;  Service: Cardiovascular;  Laterality: N/A;  loop    Social History:  reports that she has never smoked. She has never used smokeless tobacco. She reports that she does not drink alcohol or use drugs.  Family History:  Family History  Problem Relation Age of Onset   Heart attack Mother    Hypertension Mother    Heart disease Father    Hypertension Father    Stroke Neg Hx      Prior to Admission medications   Medication Sig Start Date End Date Taking? Authorizing Provider  acetaminophen (TYLENOL) 325 MG tablet Take 1-2 tablets (325-650 mg total) by mouth every 4 (four) hours as needed for mild pain. 01/17/19  Yes Love, Evlyn Kanner, PA-C  Calcium Carb-Cholecalciferol (CALCIUM 500 +D PO) Take 1 tablet by mouth daily.    Yes [provider]  clopidogrel (PLAVIX) 75 MG tablet Take 1 tablet (75 mg total) by mouth daily. 01/17/19  Yes Love, Evlyn Kanner, PA-C  ferrous sulfate 325 (65 FE) MG tablet Take 1 tablet (325 mg total) by mouth daily at 12 noon. 01/17/19  Yes Love, Evlyn Kanner, PA-C  Multiple Vitamin (MULTIVITAMIN) tablet Take 1 tablet by mouth daily.   Yes [provider]  nadolol (CORGARD) 40 MG tablet Take 1 tablet (40 mg total) by mouth daily. 01/17/19  Yes Love, Evlyn Kanner, PA-C  pantoprazole (PROTONIX) 40 MG tablet Take 1 tablet (40 mg total) by mouth daily. 01/18/19  Yes Love, Evlyn Kanner, PA-C  simvastatin (ZOCOR) 40 MG tablet Take 1 tablet (40 mg total) by mouth daily at 6 PM. 01/17/19  Yes Love, Evlyn Kanner, PA-C  aspirin 81 MG EC tablet Take 1 tablet (81 mg total) by mouth daily. Continue for 2 more weeks then stop. Patient not taking: Reported on  02/02/2019 01/17/19   Jerene Pitch    Physical Exam: Vitals:   02/03/19 0130 02/03/19 0145 02/03/19 0200 02/03/19 0230  BP: (!) 84/51 96/60 (!) 95/58 (!) 90/57  Pulse: 63 64 64 62  Resp: 20 18 17 20   Temp:      TempSrc:      SpO2: 100% 100% 100% 99%  Weight:      Height:       General: Not in acute distress HEENT:       Eyes: PERRL, EOMI, no scleral icterus.       ENT: No discharge from the ears and nose.        Neck: No JVD, no bruit, no mass felt. Heme: No  neck lymph node enlargement. Cardiac: S1/S2, RRR, No murmurs, No gallops or rubs. Respiratory:  No rales, wheezing, rhonchi or rubs. GI: Soft, nondistended, nontender, organomegaly, BS present. GU: No hematuria Ext: has trace leg edema bilaterally. 2+DP/PT pulse bilaterally. Musculoskeletal: No joint deformities, No joint redness or warmth, no limitation of ROM in spin. Skin: No rashes.  Neuro: Barely arousable, not oriented X3, cranial nerves II-XII grossly intact, moves all extremities except for left arm on painful stimuli.  Labs on Admission: I have personally reviewed following labs and imaging studies  CBC: Recent Labs  Lab 02/02/19 2319  WBC 19.6*  NEUTROABS 16.2*  HGB 11.3*  HCT 40.0  MCV 82.6  PLT 286   Basic Metabolic Panel: Recent Labs  Lab 02/02/19 2319  NA 141  K 4.2  CL 108  CO2 23  GLUCOSE 150*  BUN 21  CREATININE 0.76  CALCIUM 10.4*   GFR: Estimated Creatinine Clearance: 29.6 mL/min (by C-G formula based on SCr of 0.76 mg/dL). Liver Function Tests: No results for input(s): AST, ALT, ALKPHOS, BILITOT, PROT, ALBUMIN in the last 168 hours. No results for input(s): LIPASE, AMYLASE in the last 168 hours. No results for input(s): AMMONIA in the last 168 hours. Coagulation Profile: No results for input(s): INR, PROTIME in the last 168 hours. Cardiac Enzymes: No results for input(s): CKTOTAL, CKMB, CKMBINDEX, TROPONINI in the last 168 hours. BNP (last 3 results) No results for  input(s): PROBNP in the last 8760 hours. HbA1C: No results for input(s): HGBA1C in the last 72 hours. CBG: No results for input(s): GLUCAP in the last 168 hours. Lipid Profile: No results for input(s): CHOL, HDL, LDLCALC, TRIG, CHOLHDL, LDLDIRECT in the last 72 hours. Thyroid Function Tests: No results for input(s): TSH, T4TOTAL, FREET4, T3FREE, THYROIDAB in the last 72 hours. Anemia Panel: No results for input(s): VITAMINB12, FOLATE, FERRITIN, TIBC, IRON, RETICCTPCT in the last 72 hours. Urine analysis:    Component Value Date/Time   COLORURINE YELLOW 02/03/2019 0241   APPEARANCEUR CLEAR 02/03/2019 0241   LABSPEC 1.012 02/03/2019 0241   PHURINE 6.0 02/03/2019 0241   GLUCOSEU NEGATIVE 02/03/2019 0241   HGBUR NEGATIVE 02/03/2019 0241   BILIRUBINUR NEGATIVE 02/03/2019 0241   BILIRUBINUR n 01/22/2019 0730   KETONESUR NEGATIVE 02/03/2019 0241   PROTEINUR 30 (A) 02/03/2019 0241   UROBILINOGEN 0.2 01/22/2019 0730   UROBILINOGEN 0.2 09/22/2015 1153   NITRITE NEGATIVE 02/03/2019 0241   LEUKOCYTESUR NEGATIVE 02/03/2019 0241   Sepsis Labs: (procalcitonin:4,lacticidven:4) )No results found for this or any previous visit (from the past 240 hour(s)).   Radiological Exams on Admission: Dg Elbow Complete Left  Result Date: 02/02/2019 CLINICAL DATA:  Post unwitnessed fall at home. EXAM: LEFT ELBOW - COMPLETE 3+ VIEW COMPARISON:  None. FINDINGS: The bones are under mineralized. There is no evidence of fracture, dislocation, or joint effusion. There is no evidence of arthropathy or other focal bone abnormality. Soft tissues are unremarkable. IMPRESSION: No fracture or subluxation of the left elbow. Electronically Signed   By: Narda Rutherford M.D.   On: 02/02/2019 23:04   Ct Head Wo Contrast  Result Date: 02/02/2019 CLINICAL DATA:  85 year old female with unwitnessed fall. On blood thinners. Right M2 occlusion with posterior right MCA infarct last month. EXAM: CT HEAD WITHOUT CONTRAST  TECHNIQUE: Contiguous axial images were obtained from the base of the skull through the vertex without intravenous contrast. COMPARISON:  Brain MRI 01/10/2019. Head CT 01/09/2019. FINDINGS: Brain: Developing encephalomalacia in the posterior right MCA territory on series 3,  image 23. No malignant hemorrhagic transformation or mass effect. Questionable petechial hemorrhage on series 3, image 24. Elsewhere Stable gray-white matter differentiation throughout the brain. Patchy white matter hypodensity. No midline shift, ventriculomegaly, mass effect, evidence of mass lesion, intracranial hemorrhage or new cortically based acute infarction. Vascular: Mild Calcified atherosclerosis at the skull base. No suspicious intracranial vascular hyperdensity. Skull: Stable and intact. Osteopenia. Sinuses/Orbits: Chronic right maxillary and left sphenoid sinus disease. Trace low-density fluid in the right sphenoid is new. Other visualized paranasal sinuses and mastoids are stable and well pneumatized. Other: No acute orbit or scalp soft tissue finding. IMPRESSION: 1. No acute intracranial abnormality or acute traumatic injury identified. 2. Expected evolution of the posterior right MCA territory infarct since February. Electronically Signed   By: Odessa Fleming M.D.   On: 02/02/2019 22:44   Dg Chest Port 1 View  Result Date: 02/03/2019 CLINICAL DATA:  Post unwitnessed fall today. EXAM: PORTABLE CHEST 1 VIEW COMPARISON:  01/02/2017 FINDINGS: Implanted loop recorder projects over the left chest wall. Unchanged heart size with mild cardiomegaly. Unchanged mediastinal contours. Aortic atherosclerosis. Ill-defined bibasilar opacities, left greater than right. Vascular congestion. No pneumothorax. The bones are under mineralized. Left lower rib fractures are likely remote. Scoliotic curvature of spine. IMPRESSION: 1. Mild cardiomegaly. Vascular congestion without edema. 2. Ill-defined bibasilar opacities are likely atelectasis. Aspiration or  pneumonia could have a similar appearance in the appropriate clinical setting. 3. Left lower rib fractures are likely remote. Given fall today, if there is rib tenderness, consider dedicated rib exam. Electronically Signed   By: Narda Rutherford M.D.   On: 02/03/2019 03:33   Dg Shoulder Left  Result Date: 02/02/2019 CLINICAL DATA:  Post unwitnessed fall at home. EXAM: LEFT SHOULDER - 2+ VIEW COMPARISON:  None. FINDINGS: The bones are under mineralized. Glenohumeral alignment is not well assessed due to positioning. No acute fracture. Acromioclavicular joint is congruent. IMPRESSION: Glenohumeral alignment is not well assessed due to positioning. No acute fracture. Electronically Signed   By: Narda Rutherford M.D.   On: 02/02/2019 23:03     EKG: Independently reviewed.  Sinus rhythm, QTC 461, LAE, LAD, T wave inversion in lead I/aVL.    Assessment/Plan Principal Problem:   Fall Active Problems:   Hypercholesterolemia   Hypertension   Iron deficiency anemia   Stroke (cerebrum) (HCC)   Acute metabolic encephalopathy   Leukocytosis   Hypotension   Fall: had unwitnessed fall.  CT head is negative for acute intracranial abnormalities, but showed previous right MCA stroke.  X-ray of left elbow and left shoulder did not show new fracture.  Chest x-ray showed possible remote left rib fracture, but patient does not seem to have tenderness on chest wall.  Patient lives alone. It seems that patient cannot take care of herself anymore.  -Placed on telemetry bed for observation -Consult case management and social worker for possible rehab placement  Acute metabolic encephalopathy: Possibly due to high-dose of fentanyl (150 mg given by EMS). -Frequent neuro check -Hold all oral medications and keep patient n.p.o. until mental status improves.  Hypotension: Possibly due to high dose of fentanyl.  Patient seems to be dehydrated given creatinine 0.76/BUN 21 ratio and mild hypercalcemia (calcium  10.4).  Blood pressure responded to fluid quickly, blood pressure improved from 81/50-->99/52 after giving 500 cc x 2 of NS.  -will give another 500 cc of NS -75 cc/h of NS maintenance  Oxygen desaturation: Likely due to fentanyl use.  Resolved already. -Continue nasal cannula oxygen to  maintain oxygen saturation above 93%  Hypercholesterolemia: -Hold Zocor until mental status improves  Recent right MCA stroke: Has right upper extremity weakness. -Hold aspirin and Zocor until mental status improves  Hypertension: -IV hydralazine as needed  Iron deficiency anemia: Hemoglobin stable, 11.3. -Hold iron supplement until mental status improves  Leukocytosis: no signs of infection. No fever. UA negative. CXR did not show infiltration.  Likely due to stress induced to demargination. -will follow up blood culture -Get Procalcitonin and trend lactic acid level -IVF as above -follow up by CBC    DVT ppx:   SQ Lovenox Code Status: Full code Family Communication: Yes, patient's daughter at bed side Disposition Plan:  To be determined Consults called:  None Admission status: Obs / tele      Date of Service 02/03/2019    Lorretta HarpXilin Burnadette Baskett [facility]   If 7PM-7AM, please contact night-coverage [email or web address] Password [email or web address] 02/03/2019, 4:45 AM

## 2019-02-03 NOTE — Progress Notes (Addendum)
  PROGRESS NOTE  Patient admitted earlier this morning. See H&P. Madison Odom is a 85 y.o. female with medical history significant of hypertension, hyperlipidemia, iron deficiency anemia, MVP, scoliosis, GERD, recent admission due to right MCA stroke, who presents with fall and altered mental status. Patient was recently hospitalized from 2/27-3/3 due to right MCA stroke, then completed rehab, discharged home on 3/7.  Per her daughter, patient did not fully recover from stroke in terms of her mentation.  She still has left arm weakness.  She has confusion at baseline since the stroke, but recognizes her daughter, most of time is not orientated to time and place.  She presented with a fall, acute metabolic encephalopathy, hypotension and acute hypoxemic respiratory failure.  -Continues to be mildly confused, although seems to have improved since admission. CT head without acute intracranial abnormality or acute traumatic injury identified. -Hypotensive this morning as well, give IV fluid bolus, blood pressure improved with IVF  -Leukocytosis without infectious etiology found. UA negative. CXR showed ill-defined bibasilar opacities are likely atelectasis. Aspiration or pneumonia could have a similar appearance in the appropriate clinical setting. Blood cx pending.  -PT OT, suspect she will need rehab on discharge -Addendum: notified by RN that patient's telemetry showed missed beat, brady 30. Now improved to HR 66 without symptoms. Hold home nadolol.   Updated daughter over the phone today.    Noralee Stain, DO [facility] [email or web address] 02/03/2019, 1:35 PM

## 2019-02-03 NOTE — Evaluation (Addendum)
Occupational Therapy Evaluation Patient Details Name: Madison MEININGER MRN: [id number] DOB: 1934/01/12 Today's Date: 02/03/2019    History of Present Illness Madison Odom a 85 y.o.femalewith medical history significant ofhypertension, hyperlipidemia, iron deficiency anemia, MVP, scoliosis, GERD, recent admission due to right MCA stroke, who presents with fall and altered mental status. Patient was recently hospitalized from 2/27-3/3 due to right MCA stroke, then completed rehab,discharged home on 3/7. Pt now presents with unwitnessed fall and altered mental status. CT head without acute intracranial abnormality or acute traumatic injury identified. Imaging of shoulder and elbow shows no acute fracture, imaging of chest with suspected remote lower L rib fractures.    Clinical Impression   This 85 y/o female presents with the above. Pt reports prior to this admission she was performing functional mobility using rollator, reports completing ADL without assist and family checking in daily; living alone. Pt now presents with impaired cognition, L side pain, weakness and incoordination, decreased sitting and standing balance. Pt requiring maxA+2 for stand pivot transfers this session; she currently requires minA for self-feeding ADL, modA for UB ADL and max-totalA for LB ADL. Pt with notable L lateral lean and requires at least modA for static sitting balance EOB. Given decline in functional status pt will benefit from continued acute OT services and recommend follow up therapy services in SNF setting after discharge to maximize her safety and independence with ADL and mobility. Will follow.     Follow Up Recommendations  SNF;Supervision/Assistance - 24 hour    Equipment Recommendations  Other (comment)(defer to next venue)           Precautions / Restrictions Precautions Precautions: Fall Restrictions Weight Bearing Restrictions: No      Mobility Bed Mobility Overal bed mobility: Needs  Assistance Bed Mobility: Supine to Sit     Supine to sit: Max assist;+2 for physical assistance;+2 for safety/equipment     General bed mobility comments: pt requires verbal/tactile cues to initiate moving LEs towards EOB - requires physical assist to fully complete, assist to elevate trunk and use of bed pad to scoot hips towards EOB  Transfers Overall transfer level: Needs assistance Equipment used: 2 person hand held assist Transfers: Sit to/from [employer] Transfers Sit to Stand: Max assist;+2 physical assistance;+2 safety/equipment Stand pivot transfers: Max assist;+2 physical assistance;+2 safety/equipment       General transfer comment: pt requires two person assist via face to face method to rise from EOB, use of bed pad for additional support/stability at hips; pt with painful L side/UE during transitions - unable to successfully step LEs requiring physical assist to transition and turn hips/LEs towards recliner     Balance Overall balance assessment: Needs assistance Sitting-balance support: Feet supported Sitting balance-Leahy Scale: Poor Sitting balance - Comments: pt requires at least modA to maintain sitting balance EOB; L lateral lean noted Postural control: Left lateral lean Standing balance support: Bilateral upper extremity supported Standing balance-Leahy Scale: Poor Standing balance comment: reliant on UE/external support                           ADL either performed or assessed with clinical judgement   ADL Overall ADL's : Needs assistance/impaired Eating/Feeding: Minimal assistance;Sitting Eating/Feeding Details (indicate cue type and reason): required support to maintain grasp on cup, requires encouragement to reach and grab cup from therapist  Grooming: Minimal assistance;Sitting   Upper Body Bathing: Moderate assistance;Sitting Upper Body Bathing Details (indicate cue type and reason):  supported sitting Lower Body Bathing: Maximal  assistance;+2 for physical assistance;+2 for safety/equipment;Sit to/from stand   Upper Body Dressing : Moderate assistance;Sitting   Lower Body Dressing: Maximal assistance;Total assistance;+2 for physical assistance;+2 for safety/equipment;Sit to/from stand Lower Body Dressing Details (indicate cue type and reason): requires assist to don socks seated EOB today Toilet Transfer: Maximal assistance;+2 for physical assistance;+2 for safety/equipment;Stand-pivot Toilet Transfer Details (indicate cue type and reason): simulated via transfer to recliner Toileting- Clothing Manipulation and Hygiene: Maximal assistance;Total assistance;+2 for physical assistance;+2 for safety/equipment;Sit to/from stand       Functional mobility during ADLs: Maximal assistance;+2 for physical assistance;+2 for safety/equipment(stand pivot transfers) General ADL Comments: pt with impaired cognition, weakness, painful left side, decreased sitting and standing balance impacting her functional performance     Vision Baseline Vision/History: Wears glasses Wears Glasses: At all times(per most recent admission) Additional Comments: pt noted to have L inattention per previous admission; R eye ptosis at baseline per chart review; pt noted to have difficulty focusing on and locating cup held in from of pt when providing her with a drink - to be further assessed      Perception     Praxis      Pertinent Vitals/Pain Pain Assessment: Faces Faces Pain Scale: Hurts even more Pain Location: L side/ribcage, LUE Pain Descriptors / Indicators: Discomfort;Grimacing;Sore Pain Intervention(s): Limited activity within patient's tolerance;Monitored during session;Repositioned     Hand Dominance Right   Extremity/Trunk Assessment Upper Extremity Assessment Upper Extremity Assessment: LUE deficits/detail;Generalized weakness;RUE deficits/detail RUE Deficits / Details: bruised and slightly edemous LUE Deficits / Details: pt  with minimal ROM in LUE, decreased initiation of moving UE requiring physical assist to do so; pt with impaired coordination and ataxic-like movements with attempts at AROM; bruises noted LUE Coordination: decreased fine motor;decreased gross motor   Lower Extremity Assessment Lower Extremity Assessment: Defer to PT evaluation   Cervical / Trunk Assessment Cervical / Trunk Assessment: Kyphotic(h/o scoliosis)   Communication Communication Communication: No difficulties   Cognition Arousal/Alertness: Lethargic;Awake/alert Behavior During Therapy: WFL for tasks assessed/performed Overall Cognitive Status: Impaired/Different from baseline Area of Impairment: Memory;Following commands;Safety/judgement;Awareness;Problem solving                     Memory: Decreased short-term memory Following Commands: Follows one step commands with increased time;Follows one step commands inconsistently Safety/Judgement: Decreased awareness of deficits Awareness: Intellectual Problem Solving: Slow processing;Decreased initiation;Difficulty sequencing;Requires verbal cues;Requires tactile cues General Comments: pt noted to have STM deficits, decreased insight into current deficits, denies recent falls though per chart pt with unwitnessed fall PTA; pt requires mulitmodal cues to follow one step commands   General Comments  NT present end of session taking vitals, VSS up in recliner with pt on 3L O2    Exercises     Shoulder Instructions      Home Living Family/patient expects to be discharged to:: Private residence Living Arrangements: Alone Available Help at Discharge: Family;Available PRN/intermittently Type of Home: House Home Access: Stairs to enter   Entrance Stairs-Rails: None Home Layout: Two level;Other (Comment)(has a chair lift)     Bathroom Shower/Tub: Producer, television/film/video: Standard     Home Equipment: Environmental consultant - 4 wheels;Shower seat;Grab bars - tub/shower    Additional Comments: PLOF obtained from most recent admission; per chart pt has 2 4 wheeled walkers one upstairs and one downstairs      Prior Functioning/Environment Level of Independence: Independent with assistive device(s)  Comments: reports she uses rollator, reports completing ADL without assist and reports family checks in 2-3 times/day; question pt accuracy of report as pt denies recent falls        OT Problem List: Decreased strength;Decreased range of motion;Decreased activity tolerance;Impaired balance (sitting and/or standing);Decreased cognition;Decreased safety awareness;Decreased knowledge of use of DME or AE;Pain;Impaired UE functional use;Decreased coordination;Impaired vision/perception      OT Treatment/Interventions: Self-care/ADL training;Therapeutic exercise;Neuromuscular education;DME and/or AE instruction;Therapeutic activities;Patient/family education;Balance training;Visual/perceptual remediation/compensation;Cognitive remediation/compensation    OT Goals(Current goals can be found in the care plan section) Acute Rehab OT Goals Patient Stated Goal: less pain OT Goal Formulation: With patient Time For Goal Achievement: 02/17/19 Potential to Achieve Goals: Fair  OT Frequency: Min 2X/week   Barriers to D/C:            Co-evaluation PT/OT/SLP Co-Evaluation/Treatment: Yes Reason for Co-Treatment: To address functional/ADL transfers;For patient/therapist safety   OT goals addressed during session: ADL's and self-care;Strengthening/ROM      AM-PAC OT "6 Clicks" Daily Activity     Outcome Measure Help from another person eating meals?: A Little Help from another person taking care of personal grooming?: A Lot Help from another person toileting, which includes using toliet, bedpan, or urinal?: Total Help from another person bathing (including washing, rinsing, drying)?: A Lot Help from another person to put on and taking off regular upper body  clothing?: A Lot Help from another person to put on and taking off regular lower body clothing?: Total 6 Click Score: 11   End of Session Equipment Utilized During Treatment: Oxygen;Gait belt Nurse Communication: Mobility status  Activity Tolerance: Patient tolerated treatment well;Patient limited by pain Patient left: in chair;with call bell/phone within reach;with chair alarm set;with nursing/sitter in room(NT present)  OT Visit Diagnosis: Unsteadiness on feet (R26.81);Muscle weakness (generalized) (M62.81);History of falling (Z91.81)                Time: 1914-7829 OT Time Calculation (min): 28 min Charges:  OT General Charges $OT Visit: 1 Visit OT Evaluation $OT Eval Moderate Complexity: 1 Mod  Marcy Siren, OT [employer] Pager [phone] Office [phone]   Orlando Penner 02/03/2019, 4:23 PM

## 2019-02-03 NOTE — Progress Notes (Signed)
CRITICAL VALUE ALERT  Critical Value:  Lactic 2.6  Date & Time Notied:  02/03/19  06:32  Provider Notified: Bruna Potter  Orders Received/Actions taken:

## 2019-02-03 NOTE — Progress Notes (Signed)
CCMD called staff and informed that patient had missed beats and a HR 31. Patient is asymptomatic, resting in bed. At this time HR is 66. MD was notified. Will continue to monitor.

## 2019-02-03 NOTE — Progress Notes (Signed)
Pt arrived from ED via stretcher about 0540. Respirations even and unlabored skin warm and dry. Easy to awaken but quickly goes back to sleep. O2 @ 3L via N/C, 87% on room air Pt has glasses, shoes, clothes and a pair of earrings. All items in patients closet except pillow and eye glasses. Pt knows she is at [facility] knows president, year and month. Patient continues to have left sided weakness. Pt request sleep at this time. Pt NPO and on bedrest at this time. PT has skin tear to left wrist, scattered bruising over all extremities. Pink foam on spine and coccyx to prevent pressure. Bruising noted at left toes. Labia excoriated. Pt reports pain with movement of left arm.

## 2019-02-03 NOTE — Progress Notes (Signed)
MD notified about patient's low BP. Will continue to monitor.

## 2019-02-04 DIAGNOSIS — I10 Essential (primary) hypertension: Secondary | ICD-10-CM | POA: Diagnosis not present

## 2019-02-04 DIAGNOSIS — I959 Hypotension, unspecified: Secondary | ICD-10-CM | POA: Diagnosis not present

## 2019-02-04 DIAGNOSIS — W19XXXA Unspecified fall, initial encounter: Secondary | ICD-10-CM | POA: Diagnosis not present

## 2019-02-04 DIAGNOSIS — R410 Disorientation, unspecified: Secondary | ICD-10-CM

## 2019-02-04 LAB — CBC
HEMATOCRIT: 32.4 % — AB (ref 36.0–46.0)
Hemoglobin: 9.2 g/dL — ABNORMAL LOW (ref 12.0–15.0)
MCH: 23.4 pg — AB (ref 26.0–34.0)
MCHC: 28.4 g/dL — ABNORMAL LOW (ref 30.0–36.0)
MCV: 82.4 fL (ref 80.0–100.0)
Platelets: 178 10*3/uL (ref 150–400)
RBC: 3.93 MIL/uL (ref 3.87–5.11)
RDW: 28.9 % — ABNORMAL HIGH (ref 11.5–15.5)
WBC: 10.8 10*3/uL — ABNORMAL HIGH (ref 4.0–10.5)
nRBC: 0 % (ref 0.0–0.2)

## 2019-02-04 LAB — BASIC METABOLIC PANEL
Anion gap: 11 (ref 5–15)
BUN: 20 mg/dL (ref 8–23)
CO2: 21 mmol/L — ABNORMAL LOW (ref 22–32)
Calcium: 8.6 mg/dL — ABNORMAL LOW (ref 8.9–10.3)
Chloride: 108 mmol/L (ref 98–111)
Creatinine, Ser: 0.59 mg/dL (ref 0.44–1.00)
GFR calc Af Amer: >60 mL/min (ref >60)
GFR calc non Af Amer: >60 mL/min (ref >60)
Glucose, Bld: 77 mg/dL (ref 70–99)
Potassium: 3.6 mmol/L (ref 3.5–5.1)
Sodium: 140 mmol/L (ref 135–145)

## 2019-02-04 MED ORDER — NADOLOL 20 MG PO TABS: 20.0000 mg | ORAL_TABLET | Freq: Every day | ORAL | Status: AC

## 2019-02-04 MED ORDER — NADOLOL 20 MG PO TABS
20.0000 mg | ORAL_TABLET | Freq: Every day | ORAL | Status: DC
  Administered 2019-02-04 – 2019-02-05 (×2): 20 mg via ORAL
  Filled 2019-02-04 (×2): qty 1

## 2019-02-04 NOTE — TOC Initial Note (Signed)
Transition of Care [facility]) - Initial/Assessment Note    Patient Details  Name: Madison Odom MRN: [id number] Date of Birth: 01-Jul-1934  [facility]) CM/SW Contact:    Mearl Latin, LCSW Phone Number: 02/04/2019, 11:34 AM  Clinical Narrative:                 CSW received consult for possible SNF placement at time of discharge. CSW spoke with patient regarding PT recommendation of SNF placement at time of discharge. Patient reported that she lives alone and requires rehab. Patient expressed understanding of PT recommendation and is agreeable to SNF placement at time of discharge. Patient states she is unable to remember which SNF she wants and requests CSW speak with her daughter, Marisue Ivan. CSW spoke with Lanora Manis. She reports that patient has been to [place] before but they request someplace closer to them. CSW discussed insurance authorization process and provided Medicare SNF ratings list. She requests [facility] and they will begin insurance process. CSW to continue to follow and assist with discharge planning needs.   Expected Discharge Plan: Skilled Nursing Facility Barriers to Discharge: Insurance Authorization   Patient Goals and CMS Choice Patient states their goals for this hospitalization and ongoing recovery are:: Rehab and then return home CMS Medicare.gov Compare Post Acute Care list provided to:: Patient Choice offered to / list presented to : Patient  Expected Discharge Plan and Services Expected Discharge Plan: Skilled Nursing Facility In-house Referral: Clinical Social Work Discharge Planning Services: NA Post Acute Care Choice: Skilled Nursing Facility Living arrangements for the past 2 months: Single Family Home                 DME Arranged: N/A DME Agency: NA HH Arranged: NA HH Agency: NA  Prior Living Arrangements/Services Living arrangements for the past 2 months: Single Family Home Lives with:: Self Patient language and need for interpreter  reviewed:: Yes Do you feel safe going back to the place where you live?: Yes      Need for Family Participation in Patient Care: No (Comment) Care giver support system in place?: Yes (comment) Current home services: DME Criminal Activity/Legal Involvement Pertinent to Current Situation/Hospitalization: No - Comment as needed  Activities of Daily Living Home Assistive Devices/Equipment: Dan Humphreys (specify type) ADL Screening (condition at time of admission) Patient's cognitive ability adequate to safely complete daily activities?: No Is the patient deaf or have difficulty hearing?: No Does the patient have difficulty seeing, even when wearing glasses/contacts?: No Does the patient have difficulty concentrating, remembering, or making decisions?: Yes Patient able to express need for assistance with ADLs?: Yes Does the patient have difficulty dressing or bathing?: Yes Independently performs ADLs?: No Communication: Independent Dressing (OT): Needs assistance Is this a change from baseline?: Change from baseline, expected to last >3 days Grooming: Needs assistance Is this a change from baseline?: Change from baseline, expected to last >3 days Feeding: Independent Bathing: Needs assistance Is this a change from baseline?: Change from baseline, expected to last >3 days Toileting: Needs assistance Is this a change from baseline?: Change from baseline, expected to last >3days In/Out Bed: Needs assistance Is this a change from baseline?: Change from baseline, expected to last >3 days Walks in Home: Independent with device (comment) Does the patient have difficulty walking or climbing stairs?: Yes Weakness of Legs: Both Weakness of Arms/Hands: Left  Permission Sought/Granted Permission sought to share information with : Facility Industrial/product designer granted to share information with : Yes, Verbal Permission Granted  Permission granted to share info w AGENCY: SNFs         Emotional Assessment Appearance:: Appears stated age Attitude/Demeanor/Rapport: Engaged Affect (typically observed): Appropriate, Accepting Orientation: : Oriented to Self, Oriented to Place, Oriented to  Time, Oriented to Situation Alcohol / Substance Use: Not Applicable Psych Involvement: No (comment)  Admission diagnosis:  Confusion [R41.0] Fall, initial encounter [W19.XXXA] Patient Active Problem List   Diagnosis Date Noted  . Stroke (cerebrum) (HCC) 02/03/2019  . Fall 02/03/2019  . Acute metabolic encephalopathy 02/03/2019  . Leukocytosis 02/03/2019  . Hypotension 02/03/2019  . Iron deficiency anemia 01/15/2019  . Acute ischemic right MCA stroke (HCC) 01/14/2019  . Acute CVA (cerebrovascular accident) (HCC) 01/09/2019  . Epiretinal membrane, left eye 04/18/2017  . Acute blood loss anemia 09/27/2015  . Pelvic fracture (HCC) 09/21/2015  . Closed fracture of pelvis (HCC)   . Hypertensive retinopathy of both eyes 08/16/2015  . Hypertension 01/28/2014  . Kyphoscoliosis 09/16/2013  . After cataract 02/19/2013  . Pseudophakia of both eyes 01/22/2013  . Posterior capsular opacification 10/29/2012  . Cystoid macular edema 04/30/2012  . Epiretinal membrane 04/30/2012  . MVP (mitral valve prolapse) 05/24/2011  . Hypercholesterolemia 05/24/2011   PCP:  [name], NP Pharmacy:   [facility] [phone] - [place], [place] - [address] AT [address] OF [facility] [address] [address] [place] [address] Phone: [phone] Fax: [phone]     Social Determinants of Health (SDOH) Interventions    Readmission Risk Interventions Readmission Risk Prevention Plan 02/04/2019  Transportation Screening Complete  PCP or Specialist Appt within 5-7 Days Complete  Home Care Screening Complete  Medication Review (RN CM) Complete  Some recent data might be hidden

## 2019-02-04 NOTE — Discharge Summary (Signed)
Physician Discharge Summary  Madison Odom OEH:[id number] DOB: 05-02-1934 DOA: 02/02/2019  PCP: [name], NP  Admit date: 02/02/2019 Discharge date: 02/04/2019  Time spent: 35 minutes  Recommendations for Outpatient Follow-up:  1. PCP in 1 week   Discharge Diagnoses:  Principal Problem:   Fall   Debility   Gait disorder   Hypercholesterolemia   Hypertension   Iron deficiency anemia   Recent Stroke (cerebrum) (HCC)   Acute metabolic encephalopathy   Leukocytosis   Hypotension   Discharge Condition: stable  Diet recommendation: heart healthy  Filed Weights   02/02/19 2300  Weight: 34.8 kg    History of present illness:  84/F with history of hypertension, high with chronic anemia, scoliosis, gait disorder not on a recent admission with right MCA stroke.  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 -Patient still has residual left arm weakness, was brought to the ED following another mechanical fall, per EMS she got IV fentanyl and subsequently became hypotensive and hypoxemic, and was admitted.  Hospital Course:   Gait disorder with frequent falls -Also compounded by dehydration and recent stroke, clearly denies loss of consciousness -CT head notable for evolution of recent right MCA stroke, no acute findings -XRays of the left elbow and left shoulder without acute findings -pt is chronically ill / debilitated, with gait disorder, felt to benefit from short-term rehab -PT OT eval completed, plan for [facility]  Metabolic encephalopathy -Patient was lethargic after she received IV fentanyl per EMS -Mentation improved -CT head as noted above  Hypotension -Following high-dose fentanyl also appeared to be dehydrated since creatinine/BUN elevated compared to baseline and hemoconcentrated -improved with hydration, IV fluids discontinued -nadolol resumed at lower dose  Recent right MCA stroke -Has mild residual  left arm weakness -resumed Plavix and Zocor  Mild cognitive dysfunction noted -likely related to prior CVA  Mild leukocytosis -Likely due to hemoconcentration and reactive -Improving, afebrile, nontoxic  Severe protein calorie malnutrition -Supplements as tolerated  Discharge Exam: Vitals:   02/04/19 0700 02/04/19 1328  BP: (!) 114/55 106/60  Pulse: 60 64  Resp: 18 18  Temp: 97.8 F (36.6 C) 98.3 F (36.8 C)  SpO2: 92% 92%    General: AAOx3 Cardiovascular: S1S2/RRR Respiratory: CTAB  Discharge Instructions   Discharge Instructions    Diet - low sodium heart healthy   Complete by:  As directed    Increase activity slowly   Complete by:  As directed      Allergies as of 02/04/2019      Reactions   Phenothiazines Other (See Comments), Anaphylaxis   Unknown reaction   Prochlorperazine Anaphylaxis   Benadryl [diphenhydramine]    Agitation , confusion    Ferrous Sulfate    Abdominal pain/diarrhea   Penicillins    Yeast infections Did it involve swelling of the face/tongue/throat, SOB, or low BP? No Did it involve sudden or severe rash/hives, skin peeling, or any reaction on the inside of your mouth or nose? No Did you need to seek medical attention at a hospital or doctor's office? No When did it last happen?30 yrs If all above answers are "NO", may proceed with cephalosporin use.   Tetracycline    Other reaction(s): Other (See Comments) Yeast infection      Medication List    STOP taking these medications   aspirin 81 MG EC tablet     TAKE these medications   acetaminophen 325 MG tablet Commonly known  as:  TYLENOL Take 1-2 tablets (325-650 mg total) by mouth every 4 (four) hours as needed for mild pain.   CALCIUM 500 +D PO Take 1 tablet by mouth daily.   clopidogrel 75 MG tablet Commonly known as:  PLAVIX Take 1 tablet (75 mg total) by mouth daily.   ferrous sulfate 325 (65 FE) MG tablet Take 1 tablet (325 mg total) by mouth daily at  12 noon.   multivitamin tablet Take 1 tablet by mouth daily.   nadolol 20 MG tablet Commonly known as:  CORGARD Take 1 tablet (20 mg total) by mouth daily. What changed:    medication strength  how much to take   pantoprazole 40 MG tablet Commonly known as:  PROTONIX Take 1 tablet (40 mg total) by mouth daily.   simvastatin 40 MG tablet Commonly known as:  ZOCOR Take 1 tablet (40 mg total) by mouth daily at 6 PM.      Allergies  Allergen Reactions  . Phenothiazines Other (See Comments) and Anaphylaxis    Unknown reaction  . Prochlorperazine Anaphylaxis  . Benadryl [Diphenhydramine]     Agitation , confusion   . Ferrous Sulfate     Abdominal pain/diarrhea  . Penicillins     Yeast infections Did it involve swelling of the face/tongue/throat, SOB, or low BP? No Did it involve sudden or severe rash/hives, skin peeling, or any reaction on the inside of your mouth or nose? No Did you need to seek medical attention at a hospital or doctor's office? No When did it last happen?30 yrs If all above answers are "NO", may proceed with cephalosporin use.   . Tetracycline     Other reaction(s): Other (See Comments) Yeast infection    Contact information for follow-up providers    [name], NP. Schedule an appointment as soon as possible for a visit in 1 week(s).   Specialty:  Family Medicine Contact information: [address] [place] [address] [phone]            Contact information for after-discharge care    Destination    [facility] .   Service:  Skilled Nursing Contact information: [address] [place] [address] [phone]                   The results of significant diagnostics from this hospitalization (including imaging, microbiology, ancillary and laboratory) are listed below for reference.    Significant Diagnostic Studies: Ct Angio Head W Or Wo  Contrast  Result Date: 01/09/2019 CLINICAL DATA:  Left-sided neglect. EXAM: CT ANGIOGRAPHY HEAD AND NECK CT PERFUSION BRAIN TECHNIQUE: Multidetector CT imaging of the head and neck was performed using the standard protocol during bolus administration of intravenous contrast. Multiplanar CT image reconstructions and MIPs were obtained to evaluate the vascular anatomy. Carotid stenosis measurements (when applicable) are obtained utilizing NASCET criteria, using the distal internal carotid diameter as the denominator. Multiphase CT imaging of the brain was performed following IV bolus contrast injection. Subsequent parametric perfusion maps were calculated using RAPID software. CONTRAST:  ISOVUE-370 IOPAMIDOL (ISOVUE-370) INJECTION 76% COMPARISON:  None. FINDINGS: CTA NECK FINDINGS Aortic arch: Normal variant 4 vessel aortic arch with the left vertebral artery arising directly from the arch. Widely patent arch vessel origins. Right carotid system: Patent without evidence of stenosis or dissection. Mildly ectatic appearance of the mid to distal cervical ICA. Left carotid system: Patent without evidence of stenosis or dissection. Mildly ectatic appearance of the  mid to distal cervical ICA. Vertebral arteries: Patent without evidence of significant stenosis or dissection. Codominant. Skeleton: Mild-to-moderate cervical disc degeneration. Right C4-5 facet ankylosis. Other neck: Subcentimeter bilateral thyroid nodules. Complete right maxillary sinus opacification. Upper chest: Mild pleural-parenchymal scarring in the lung apices. Review of the MIP images confirms the above findings CTA HEAD FINDINGS Anterior circulation: The internal carotid arteries are widely patent from skull base to carotid termini. ACAs and MCAs are patent without evidence of significant proximal stenosis. There is a distal right M2 occlusion corresponding to the acute right parietal infarct. No aneurysm is identified. Posterior circulation:  The intracranial vertebral arteries are widely patent to the basilar. Patent left PICA, right AICA, and bilateral SCAs are visualized. The basilar artery is widely patent. There are predominantly fetal type origins of both PCAs without evidence of significant PCA stenosis. No aneurysm is identified. Venous sinuses: Patent. Anatomic variants: Fetal type origins of both PCAs. Review of the MIP images confirms the above findings CT Brain Perfusion Findings: CBF (<30%) Volume: 0mL Perfusion (Tmax>6.0s) volume: 31mL Mismatch Volume: 31mL by automated RAPID processing, however the area of prolonged Tmax corresponds to the low-density established infarct on the earlier noncontrast head CT without evidence of significant penumbra. Infarction Location: Right parietal lobe IMPRESSION: 1. Distal right M2 occlusion. 2. Associated right parietal infarct without evidence of significant penumbra by CTP. 3. No significant stenosis in the proximal intracranial or cervical arterial circulation. These results were communicated to Dr. Wilford Corner at 11:20 am on 01/09/2019 by text page via the [facility] messaging system. Electronically Signed   By: Sebastian Ache M.D.   On: 01/09/2019 11:32   Dg Elbow Complete Left  Result Date: 02/02/2019 CLINICAL DATA:  Post unwitnessed fall at home. EXAM: LEFT ELBOW - COMPLETE 3+ VIEW COMPARISON:  None. FINDINGS: The bones are under mineralized. There is no evidence of fracture, dislocation, or joint effusion. There is no evidence of arthropathy or other focal bone abnormality. Soft tissues are unremarkable. IMPRESSION: No fracture or subluxation of the left elbow. Electronically Signed   By: Narda Rutherford M.D.   On: 02/02/2019 23:04   Ct Head Wo Contrast  Result Date: 02/02/2019 CLINICAL DATA:  85 year old female with unwitnessed fall. On blood thinners. Right M2 occlusion with posterior right MCA infarct last month. EXAM: CT HEAD WITHOUT CONTRAST TECHNIQUE: Contiguous axial images were obtained from  the base of the skull through the vertex without intravenous contrast. COMPARISON:  Brain MRI 01/10/2019. Head CT 01/09/2019. FINDINGS: Brain: Developing encephalomalacia in the posterior right MCA territory on series 3, image 29. No malignant hemorrhagic transformation or mass effect. Questionable petechial hemorrhage on series 3, image 24. Elsewhere Stable gray-white matter differentiation throughout the brain. Patchy white matter hypodensity. No midline shift, ventriculomegaly, mass effect, evidence of mass lesion, intracranial hemorrhage or new cortically based acute infarction. Vascular: Mild Calcified atherosclerosis at the skull base. No suspicious intracranial vascular hyperdensity. Skull: Stable and intact. Osteopenia. Sinuses/Orbits: Chronic right maxillary and left sphenoid sinus disease. Trace low-density fluid in the right sphenoid is new. Other visualized paranasal sinuses and mastoids are stable and well pneumatized. Other: No acute orbit or scalp soft tissue finding. IMPRESSION: 1. No acute intracranial abnormality or acute traumatic injury identified. 2. Expected evolution of the posterior right MCA territory infarct since February. Electronically Signed   By: Odessa Fleming M.D.   On: 02/02/2019 22:44   Ct Head Wo Contrast  Result Date: 01/09/2019 CLINICAL DATA:  Focal neural deficit of greater than 6 hours suspected  stroke, LEFT hemi spatial neglect on exam, imbalance, history of hypertension EXAM: CT HEAD WITHOUT CONTRAST TECHNIQUE: Contiguous axial images were obtained from the base of the skull through the vertex without intravenous contrast. Sagittal and coronal MPR images reconstructed from axial data set. COMPARISON:  None FINDINGS: Brain: Generalized atrophy. Normal ventricular morphology. No midline shift or mass effect. Small vessel chronic ischemic changes of deep cerebral white matter. Loss of gray-white differentiation at the RIGHT parietal lobe consistent with cortical infarct. No  intracranial hemorrhage, mass lesion or extra-axial fluid collection. No additional infarcts identified. Vascular: No definite hyperdense vessels Skull: Demineralized with biparietal thinning.  No fractures. Sinuses/Orbits: Complete opacification of RIGHT maxillary sinus and LEFT sphenoid sinus. Remaining visualized paranasal sinuses and mastoid air cells clear Other: N/A IMPRESSION: Atrophy with small vessel chronic ischemic changes of deep cerebral white matter. Acute/subacute infarct of the RIGHT parietal lobe where loss of gray-white differentiation is seen. RIGHT maxillary and LEFT sphenoid sinus disease changes. Electronically Signed   By: Ulyses Southward M.D.   On: 01/09/2019 09:57   Ct Angio Neck W Or Wo Contrast  Result Date: 01/09/2019 CLINICAL DATA:  Left-sided neglect. EXAM: CT ANGIOGRAPHY HEAD AND NECK CT PERFUSION BRAIN TECHNIQUE: Multidetector CT imaging of the head and neck was performed using the standard protocol during bolus administration of intravenous contrast. Multiplanar CT image reconstructions and MIPs were obtained to evaluate the vascular anatomy. Carotid stenosis measurements (when applicable) are obtained utilizing NASCET criteria, using the distal internal carotid diameter as the denominator. Multiphase CT imaging of the brain was performed following IV bolus contrast injection. Subsequent parametric perfusion maps were calculated using RAPID software. CONTRAST:  ISOVUE-370 IOPAMIDOL (ISOVUE-370) INJECTION 76% COMPARISON:  None. FINDINGS: CTA NECK FINDINGS Aortic arch: Normal variant 4 vessel aortic arch with the left vertebral artery arising directly from the arch. Widely patent arch vessel origins. Right carotid system: Patent without evidence of stenosis or dissection. Mildly ectatic appearance of the mid to distal cervical ICA. Left carotid system: Patent without evidence of stenosis or dissection. Mildly ectatic appearance of the mid to distal cervical ICA. Vertebral  arteries: Patent without evidence of significant stenosis or dissection. Codominant. Skeleton: Mild-to-moderate cervical disc degeneration. Right C4-5 facet ankylosis. Other neck: Subcentimeter bilateral thyroid nodules. Complete right maxillary sinus opacification. Upper chest: Mild pleural-parenchymal scarring in the lung apices. Review of the MIP images confirms the above findings CTA HEAD FINDINGS Anterior circulation: The internal carotid arteries are widely patent from skull base to carotid termini. ACAs and MCAs are patent without evidence of significant proximal stenosis. There is a distal right M2 occlusion corresponding to the acute right parietal infarct. No aneurysm is identified. Posterior circulation: The intracranial vertebral arteries are widely patent to the basilar. Patent left PICA, right AICA, and bilateral SCAs are visualized. The basilar artery is widely patent. There are predominantly fetal type origins of both PCAs without evidence of significant PCA stenosis. No aneurysm is identified. Venous sinuses: Patent. Anatomic variants: Fetal type origins of both PCAs. Review of the MIP images confirms the above findings CT Brain Perfusion Findings: CBF (<30%) Volume: 18mL Perfusion (Tmax>6.0s) volume: 37mL Mismatch Volume: 59mL by automated RAPID processing, however the area of prolonged Tmax corresponds to the low-density established infarct on the earlier noncontrast head CT without evidence of significant penumbra. Infarction Location: Right parietal lobe IMPRESSION: 1. Distal right M2 occlusion. 2. Associated right parietal infarct without evidence of significant penumbra by CTP. 3. No significant stenosis in the proximal intracranial or cervical  arterial circulation. These results were communicated to Dr. Wilford CornerArora at 11:20 am on 01/09/2019 by text page via the [facility] messaging system. Electronically Signed   By: Sebastian AcheAllen  Grady M.D.   On: 01/09/2019 11:32   Mr Brain Wo Contrast  Result Date:  01/10/2019 CLINICAL DATA:  Stroke follow-up. EXAM: MRI HEAD WITHOUT CONTRAST TECHNIQUE: Multiplanar, multiecho pulse sequences of the brain and surrounding structures were obtained without intravenous contrast. COMPARISON:  Head CT 01/09/2019 FINDINGS: The examination was discontinued prematurely due to patient pain. Only axial and coronal diffusion-weighted imaging was obtained. There is a intermediate sized area of acute ischemia within the posterior right MCA territory, within the right parietal lobe. The area of ischemia is roughly equal to that indicated on the perfusion scan. There is no contralateral ischemia or ischemia within a different vascular territory. There is no midline shift. IMPRESSION: 1. Truncated examination due to patient discomfort. 2. Acute ischemic infarct of the posterior right MCA territory, within the right parietal lobe. Electronically Signed   By: Deatra RobinsonKevin  Herman M.D.   On: 01/10/2019 14:28   Ct Cerebral Perfusion W Contrast  Result Date: 01/09/2019 CLINICAL DATA:  Left-sided neglect. EXAM: CT ANGIOGRAPHY HEAD AND NECK CT PERFUSION BRAIN TECHNIQUE: Multidetector CT imaging of the head and neck was performed using the standard protocol during bolus administration of intravenous contrast. Multiplanar CT image reconstructions and MIPs were obtained to evaluate the vascular anatomy. Carotid stenosis measurements (when applicable) are obtained utilizing NASCET criteria, using the distal internal carotid diameter as the denominator. Multiphase CT imaging of the brain was performed following IV bolus contrast injection. Subsequent parametric perfusion maps were calculated using RAPID software. CONTRAST:  100mL ISOVUE-370 IOPAMIDOL (ISOVUE-370) INJECTION 76% COMPARISON:  None. FINDINGS: CTA NECK FINDINGS Aortic arch: Normal variant 4 vessel aortic arch with the left vertebral artery arising directly from the arch. Widely patent arch vessel origins. Right carotid system: Patent without  evidence of stenosis or dissection. Mildly ectatic appearance of the mid to distal cervical ICA. Left carotid system: Patent without evidence of stenosis or dissection. Mildly ectatic appearance of the mid to distal cervical ICA. Vertebral arteries: Patent without evidence of significant stenosis or dissection. Codominant. Skeleton: Mild-to-moderate cervical disc degeneration. Right C4-5 facet ankylosis. Other neck: Subcentimeter bilateral thyroid nodules. Complete right maxillary sinus opacification. Upper chest: Mild pleural-parenchymal scarring in the lung apices. Review of the MIP images confirms the above findings CTA HEAD FINDINGS Anterior circulation: The internal carotid arteries are widely patent from skull base to carotid termini. ACAs and MCAs are patent without evidence of significant proximal stenosis. There is a distal right M2 occlusion corresponding to the acute right parietal infarct. No aneurysm is identified. Posterior circulation: The intracranial vertebral arteries are widely patent to the basilar. Patent left PICA, right AICA, and bilateral SCAs are visualized. The basilar artery is widely patent. There are predominantly fetal type origins of both PCAs without evidence of significant PCA stenosis. No aneurysm is identified. Venous sinuses: Patent. Anatomic variants: Fetal type origins of both PCAs. Review of the MIP images confirms the above findings CT Brain Perfusion Findings: CBF (<30%) Volume: 0mL Perfusion (Tmax>6.0s) volume: 31mL Mismatch Volume: 31mL by automated RAPID processing, however the area of prolonged Tmax corresponds to the low-density established infarct on the earlier noncontrast head CT without evidence of significant penumbra. Infarction Location: Right parietal lobe IMPRESSION: 1. Distal right M2 occlusion. 2. Associated right parietal infarct without evidence of significant penumbra by CTP. 3. No significant stenosis in the proximal intracranial or  cervical arterial  circulation. These results were communicated to Dr. Wilford Corner at 11:20 am on 01/09/2019 by text page via the [facility] messaging system. Electronically Signed   By: Sebastian Ache M.D.   On: 01/09/2019 11:32   Dg Chest Port 1 View  Result Date: 02/03/2019 CLINICAL DATA:  Post unwitnessed fall today. EXAM: PORTABLE CHEST 1 VIEW COMPARISON:  01/02/2017 FINDINGS: Implanted loop recorder projects over the left chest wall. Unchanged heart size with mild cardiomegaly. Unchanged mediastinal contours. Aortic atherosclerosis. Ill-defined bibasilar opacities, left greater than right. Vascular congestion. No pneumothorax. The bones are under mineralized. Left lower rib fractures are likely remote. Scoliotic curvature of spine. IMPRESSION: 1. Mild cardiomegaly. Vascular congestion without edema. 2. Ill-defined bibasilar opacities are likely atelectasis. Aspiration or pneumonia could have a similar appearance in the appropriate clinical setting. 3. Left lower rib fractures are likely remote. Given fall today, if there is rib tenderness, consider dedicated rib exam. Electronically Signed   By: Narda Rutherford M.D.   On: 02/03/2019 03:33   Dg Shoulder Left  Result Date: 02/02/2019 CLINICAL DATA:  Post unwitnessed fall at home. EXAM: LEFT SHOULDER - 2+ VIEW COMPARISON:  None. FINDINGS: The bones are under mineralized. Glenohumeral alignment is not well assessed due to positioning. No acute fracture. Acromioclavicular joint is congruent. IMPRESSION: Glenohumeral alignment is not well assessed due to positioning. No acute fracture. Electronically Signed   By: Narda Rutherford M.D.   On: 02/02/2019 23:03    Microbiology: Recent Results (from the past 240 hour(s))  Culture, blood (x 2)     Status: None (Preliminary result)   Collection Time: 02/03/19  5:17 AM  Result Value Ref Range Status   Specimen Description BLOOD LEFT HAND  Final   Special Requests   Final    BOTTLES DRAWN AEROBIC AND ANAEROBIC Blood Culture results may  not be optimal due to an inadequate volume of blood received in culture bottles   Culture   Final    NO GROWTH 1 DAY Performed at [facility], [address], [place], [place] [address]    Report Status PENDING  Incomplete  Culture, blood (x 2)     Status: None (Preliminary result)   Collection Time: 02/03/19  5:17 AM  Result Value Ref Range Status   Specimen Description BLOOD RIGHT HAND  Final   Special Requests   Final    BOTTLES DRAWN AEROBIC ONLY Blood Culture results may not be optimal due to an inadequate volume of blood received in culture bottles   Culture   Final    NO GROWTH 1 DAY Performed at [facility], [address] [place], [place] [address]    Report Status PENDING  Incomplete     Labs: Basic Metabolic Panel: Recent Labs  Lab 02/02/19 2319 02/03/19 0517 02/04/19 0426  NA 141 142 140  K 4.2 4.1 3.6  CL 108 107 108  CO2 23 26 21*  GLUCOSE 150* 100* 77  BUN 21 19 20   CREATININE 0.76 0.61 0.59  CALCIUM 10.4* 9.5 8.6*   Liver Function Tests: No results for input(s): AST, ALT, ALKPHOS, BILITOT, PROT, ALBUMIN in the last 168 hours. No results for input(s): LIPASE, AMYLASE in the last 168 hours. No results for input(s): AMMONIA in the last 168 hours. CBC: Recent Labs  Lab 02/02/19 2319 02/03/19 0911 02/04/19 0426  WBC 19.6* 15.6* 10.8*  NEUTROABS 16.2*  --   --   HGB 11.3* 10.0* 9.2*  HCT 40.0 34.4* 32.4*  MCV 82.6 79.4* 82.4  PLT 286 216 178   Cardiac Enzymes: No results for input(s): CKTOTAL, CKMB, CKMBINDEX, TROPONINI in the last 168 hours. BNP: BNP (last 3 results) No results for input(s): BNP in the last 8760 hours.  ProBNP (last 3 results) No results for input(s): PROBNP in the last 8760 hours.  CBG: No results for input(s): GLUCAP in the last 168 hours.     Signed:  Zannie Cove MD.  [facility] 02/04/2019, 5:44 PM

## 2019-02-04 NOTE — [place] FL2 (Signed)
[place] MEDICAID FL2 LEVEL OF CARE SCREENING TOOL     IDENTIFICATION  Patient Name: Madison Odom Birthdate: [birth year] Sex: female Admission Date (Current Location): 02/02/2019  [facility] and [place] Number:  Producer, television/film/video and Address:  The . [facility], [address] [place], [place] [address]      Provider Number: [id number]  Attending Physician Name and Address:  Zannie Cove, MD  Relative Name and Phone Number:  Lanora Manis, daughter [phone]    Current Level of Care: Hospital Recommended Level of Care: Skilled Nursing Facility Prior Approval Number:    Date Approved/Denied:   PASRR Number: [id number]  Discharge Plan: SNF    Current Diagnoses: Patient Active Problem List   Diagnosis Date Noted  . Stroke (cerebrum) (HCC) 02/03/2019  . Fall 02/03/2019  . Acute metabolic encephalopathy 02/03/2019  . Leukocytosis 02/03/2019  . Hypotension 02/03/2019  . Iron deficiency anemia 01/15/2019  . Acute ischemic right MCA stroke (HCC) 01/14/2019  . Acute CVA (cerebrovascular accident) (HCC) 01/09/2019  . Epiretinal membrane, left eye 04/18/2017  . Acute blood loss anemia 09/27/2015  . Pelvic fracture (HCC) 09/21/2015  . Closed fracture of pelvis (HCC)   . Hypertensive retinopathy of both eyes 08/16/2015  . Hypertension 01/28/2014  . Kyphoscoliosis 09/16/2013  . After cataract 02/19/2013  . Pseudophakia of both eyes 01/22/2013  . Posterior capsular opacification 10/29/2012  . Cystoid macular edema 04/30/2012  . Epiretinal membrane 04/30/2012  . MVP (mitral valve prolapse) 05/24/2011  . Hypercholesterolemia 05/24/2011    Orientation RESPIRATION BLADDER Height & Weight     Self, Time, Situation, Place  Normal Incontinent Weight: 76 lb 11.5 oz (34.8 kg) Height:  4' 7.25" (140.3 cm)  BEHAVIORAL SYMPTOMS/MOOD NEUROLOGICAL BOWEL NUTRITION STATUS      Continent Diet(Please see DC Summary)  AMBULATORY STATUS COMMUNICATION OF NEEDS Skin    Limited Assist Verbally Surgical wounds(Closed incision on chest)                       Personal Care Assistance Level of Assistance  Bathing, Feeding, Dressing Bathing Assistance: Limited assistance Feeding assistance: Independent Dressing Assistance: Limited assistance     Functional Limitations Info  Sight, Hearing, Speech Sight Info: Adequate Hearing Info: Adequate Speech Info: Adequate    SPECIAL CARE FACTORS FREQUENCY  PT (By licensed PT), OT (By licensed OT)     PT Frequency: 5x/week OT Frequency: 3x/week            Contractures Contractures Info: Not present    Additional Factors Info  Code Status, Allergies Code Status Info: Full Allergies Info: Phenothiazines, Prochlorperazine, Benadryl Diphenhydramine, Ferrous Sulfate, Penicillins, Tetracycline           Current Medications (02/04/2019):  This is the current hospital active medication list Current Facility-Administered Medications  Medication Dose Route Frequency Provider Last Rate Last Dose  . acetaminophen (TYLENOL) tablet 650 mg  650 mg Oral Q6H PRN Lorretta Harp, MD       Or  . acetaminophen (TYLENOL) suppository 650 mg  650 mg Rectal Q6H PRN Lorretta Harp, MD      . clopidogrel (PLAVIX) tablet 75 mg  75 mg Oral Daily Noralee Stain, DO   75 mg at 02/04/19 4287  . enoxaparin (LOVENOX) injection 15 mg  15 mg Subcutaneous Q24H Lorretta Harp, MD   15 mg at 02/03/19 1503  . famotidine (PEPCID) IVPB 20 mg in NS 100 mL IVPB  20 mg Intravenous Q24H Lorretta Harp, MD 200  mL/hr at 02/04/19 0901 20 mg at 02/04/19 0901  . ferrous sulfate tablet 325 mg  325 mg Oral Q breakfast Noralee Stain, DO   325 mg at 02/04/19 1287  . hydrALAZINE (APRESOLINE) injection 5 mg  5 mg Intravenous Q2H PRN Lorretta Harp, MD      . ondansetron [facility]) injection 4 mg  4 mg Intravenous Q8H PRN Lorretta Harp, MD      . pantoprazole (PROTONIX) EC tablet 40 mg  40 mg Oral Daily Noralee Stain, DO   40 mg at 02/04/19 8676  . simvastatin (ZOCOR)  tablet 40 mg  40 mg Oral q1800 Noralee Stain, DO   40 mg at 02/03/19 1701     Discharge Medications: Please see discharge summary for a list of discharge medications.  Relevant Imaging Results:  Relevant Lab Results:   Additional Information Social Security #: [id number]  Mearl Latin, LCSW

## 2019-02-04 NOTE — Progress Notes (Addendum)
PROGRESS NOTE    IOLANA PINNIX  VQX:[id number] DOB: [birth year] DOA: 02/02/2019 PCP: [name], NP  Brief Narrative: 84/F with history of hypertension, high with chronic anemia, scoliosis, gait disorder not on a recent admission with right MCA stroke.  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 -Patient still has residual left arm weakness, was brought to the ED following another mechanical fall, per EMS she got IV fentanyl and subsequently became hypotensive and hypoxemic, and was admitted. -Also found to have dehydration and hemoconcentration  Assessment & Plan:   Gait disorder with frequent falls -Also compounded by dehydration and recent stroke, clearly denies loss of consciousness -CT head notable for evolution of recent right MCA stroke, no acute findings -Rays of the left elbow and left shoulder without acute findings -pt is chronically ill / debilitated, with gait disorder, would likely benefit from short-term rehab -PT OT eval, discharge planning, d/w CSW  Metabolic encephalopathy -Patient was lethargic after she received IV fentanyl per EMS -Mentation improved -CT head as noted above  Hypotension -Following high-dose fentanyl also appeared to be dehydrated since creatinine/BUN elevated compared to baseline and hemoconcentrated -improved with hydration, IV fluids discontinued  Recent right MCA stroke -Has mild residual left arm weakness -resume Plavix and Zocor  Mild leukocytosis -Likely due to hemoconcentration and reactive -Improving, afebrile, nontoxic, monitor  Severe protein calorie malnutrition -Supplements as tolerated  DVT prophylaxis:lovenox Code Status: Full Code Family Communication: no family at bedside, called and d/w daughter Lanora Manis Disposition Plan: SNF when bed available  Consultants:      Procedures:   Antimicrobials:    Subjective: -feels ok, falling frequently for months to years, gait  worse following recent stroke  Objective: Vitals:   02/03/19 1339 02/03/19 1603 02/03/19 2121 02/04/19 0700  BP: (!) 88/60 105/61 103/60 (!) 114/55  Pulse: 64 63 (!) 58 60  Resp: 16 16 18 18   Temp: 98.2 F (36.8 C) 97.8 F (36.6 C) 97.8 F (36.6 C) 97.8 F (36.6 C)  TempSrc: Oral Oral    SpO2: 100% 99% 100% 92%  Weight:      Height:        Intake/Output Summary (Last 24 hours) at 02/04/2019 1039 Last data filed at 02/04/2019 0034 Gross per 24 hour  Intake 2867.68 ml  Output 2 ml  Net 2865.68 ml   Filed Weights   02/02/19 2300  Weight: 34.8 kg    Examination:  General exam: Appears calm and comfortable, frail chronically ill-appearing, no distress Respiratory system: Decreased breath breath sounds at bases Cardiovascular system: S1 & S2 heard, RRR  Gastrointestinal system: Abdomen is nondistended, soft and nontender.Normal bowel sounds heard. Central nervous system: Alert and oriented. Extremities: no edema Skin: No rashes, lesions or ulcers Psychiatry: Judgement and insight appear normal. Mood & affect appropriate.   Data Reviewed:   CBC: Recent Labs  Lab 02/02/19 2319 02/03/19 0911 02/04/19 0426  WBC 19.6* 15.6* 10.8*  NEUTROABS 16.2*  --   --   HGB 11.3* 10.0* 9.2*  HCT 40.0 34.4* 32.4*  MCV 82.6 79.4* 82.4  PLT 286 216 178   Basic Metabolic Panel: Recent Labs  Lab 02/02/19 2319 02/03/19 0517 02/04/19 0426  NA 141 142 140  K 4.2 4.1 3.6  CL 108 107 108  CO2 23 26 21*  GLUCOSE 150* 100* 77  BUN 21 19 20   CREATININE 0.76 0.61 0.59  CALCIUM 10.4* 9.5 8.6*   GFR: Estimated Creatinine Clearance: 29.6  mL/min (by C-G formula based on SCr of 0.59 mg/dL). Liver Function Tests: No results for input(s): AST, ALT, ALKPHOS, BILITOT, PROT, ALBUMIN in the last 168 hours. No results for input(s): LIPASE, AMYLASE in the last 168 hours. No results for input(s): AMMONIA in the last 168 hours. Coagulation Profile: No results for input(s): INR, PROTIME in the  last 168 hours. Cardiac Enzymes: No results for input(s): CKTOTAL, CKMB, CKMBINDEX, TROPONINI in the last 168 hours. BNP (last 3 results) No results for input(s): PROBNP in the last 8760 hours. HbA1C: No results for input(s): HGBA1C in the last 72 hours. CBG: No results for input(s): GLUCAP in the last 168 hours. Lipid Profile: No results for input(s): CHOL, HDL, LDLCALC, TRIG, CHOLHDL, LDLDIRECT in the last 72 hours. Thyroid Function Tests: No results for input(s): TSH, T4TOTAL, FREET4, T3FREE, THYROIDAB in the last 72 hours. Anemia Panel: No results for input(s): VITAMINB12, FOLATE, FERRITIN, TIBC, IRON, RETICCTPCT in the last 72 hours. Urine analysis:    Component Value Date/Time   COLORURINE YELLOW 02/03/2019 0241   APPEARANCEUR CLEAR 02/03/2019 0241   LABSPEC 1.012 02/03/2019 0241   PHURINE 6.0 02/03/2019 0241   GLUCOSEU NEGATIVE 02/03/2019 0241   HGBUR NEGATIVE 02/03/2019 0241   BILIRUBINUR NEGATIVE 02/03/2019 0241   BILIRUBINUR n 01/22/2019 0730   KETONESUR NEGATIVE 02/03/2019 0241   PROTEINUR 30 (A) 02/03/2019 0241   UROBILINOGEN 0.2 01/22/2019 0730   UROBILINOGEN 0.2 09/22/2015 1153   NITRITE NEGATIVE 02/03/2019 0241   LEUKOCYTESUR NEGATIVE 02/03/2019 0241   Sepsis Labs: (procalcitonin:4,lacticidven:4)  )No results found for this or any previous visit (from the past 240 hour(s)).       Radiology Studies: Dg Elbow Complete Left  Result Date: 02/02/2019 CLINICAL DATA:  Post unwitnessed fall at home. EXAM: LEFT ELBOW - COMPLETE 3+ VIEW COMPARISON:  None. FINDINGS: The bones are under mineralized. There is no evidence of fracture, dislocation, or joint effusion. There is no evidence of arthropathy or other focal bone abnormality. Soft tissues are unremarkable. IMPRESSION: No fracture or subluxation of the left elbow. Electronically Signed   By: Narda Rutherford M.D.   On: 02/02/2019 23:04   Ct Head Wo Contrast  Result Date: 02/02/2019 CLINICAL DATA:   85 year old female with unwitnessed fall. On blood thinners. Right M2 occlusion with posterior right MCA infarct last month. EXAM: CT HEAD WITHOUT CONTRAST TECHNIQUE: Contiguous axial images were obtained from the base of the skull through the vertex without intravenous contrast. COMPARISON:  Brain MRI 01/10/2019. Head CT 01/09/2019. FINDINGS: Brain: Developing encephalomalacia in the posterior right MCA territory on series 3, image 2. No malignant hemorrhagic transformation or mass effect. Questionable petechial hemorrhage on series 3, image 24. Elsewhere Stable gray-white matter differentiation throughout the brain. Patchy white matter hypodensity. No midline shift, ventriculomegaly, mass effect, evidence of mass lesion, intracranial hemorrhage or new cortically based acute infarction. Vascular: Mild Calcified atherosclerosis at the skull base. No suspicious intracranial vascular hyperdensity. Skull: Stable and intact. Osteopenia. Sinuses/Orbits: Chronic right maxillary and left sphenoid sinus disease. Trace low-density fluid in the right sphenoid is new. Other visualized paranasal sinuses and mastoids are stable and well pneumatized. Other: No acute orbit or scalp soft tissue finding. IMPRESSION: 1. No acute intracranial abnormality or acute traumatic injury identified. 2. Expected evolution of the posterior right MCA territory infarct since February. Electronically Signed   By: Odessa Fleming M.D.   On: 02/02/2019 22:44   Dg Chest Port 1 View  Result Date: 02/03/2019 CLINICAL DATA:  Post unwitnessed fall today.  EXAM: PORTABLE CHEST 1 VIEW COMPARISON:  01/02/2017 FINDINGS: Implanted loop recorder projects over the left chest wall. Unchanged heart size with mild cardiomegaly. Unchanged mediastinal contours. Aortic atherosclerosis. Ill-defined bibasilar opacities, left greater than right. Vascular congestion. No pneumothorax. The bones are under mineralized. Left lower rib fractures are likely remote. Scoliotic  curvature of spine. IMPRESSION: 1. Mild cardiomegaly. Vascular congestion without edema. 2. Ill-defined bibasilar opacities are likely atelectasis. Aspiration or pneumonia could have a similar appearance in the appropriate clinical setting. 3. Left lower rib fractures are likely remote. Given fall today, if there is rib tenderness, consider dedicated rib exam. Electronically Signed   By: Narda Rutherford M.D.   On: 02/03/2019 03:33   Dg Shoulder Left  Result Date: 02/02/2019 CLINICAL DATA:  Post unwitnessed fall at home. EXAM: LEFT SHOULDER - 2+ VIEW COMPARISON:  None. FINDINGS: The bones are under mineralized. Glenohumeral alignment is not well assessed due to positioning. No acute fracture. Acromioclavicular joint is congruent. IMPRESSION: Glenohumeral alignment is not well assessed due to positioning. No acute fracture. Electronically Signed   By: Narda Rutherford M.D.   On: 02/02/2019 23:03        Scheduled Meds: . clopidogrel  75 mg Oral Daily  . enoxaparin (LOVENOX) injection  15 mg Subcutaneous Q24H  . famotidine (PEPCID) IV  20 mg Intravenous Q24H  . ferrous sulfate  325 mg Oral Q breakfast  . pantoprazole  40 mg Oral Daily  . simvastatin  40 mg Oral q1800   Continuous Infusions:   LOS: 0 days    Time spent:    Zannie Cove, MD [facility]  02/04/2019, 10:39 AM

## 2019-02-04 NOTE — Care Management Obs Status (Signed)
MEDICARE OBSERVATION STATUS NOTIFICATION   Patient Details  Name: Madison Odom MRN: [id number] Date of Birth: [birth year]   Medicare Observation Status Notification Given:  Yes    Epifanio Lesches, RN 02/04/2019, 11:13 AM

## 2019-02-05 ENCOUNTER — Encounter: Payer: Medicare Other | Admitting: Physical Therapy

## 2019-02-05 DIAGNOSIS — R5381 Other malaise: Secondary | ICD-10-CM | POA: Diagnosis not present

## 2019-02-05 DIAGNOSIS — I6389 Other cerebral infarction: Secondary | ICD-10-CM | POA: Diagnosis not present

## 2019-02-05 DIAGNOSIS — R269 Unspecified abnormalities of gait and mobility: Secondary | ICD-10-CM | POA: Diagnosis not present

## 2019-02-05 DIAGNOSIS — G8324 Monoplegia of upper limb affecting left nondominant side: Secondary | ICD-10-CM | POA: Diagnosis not present

## 2019-02-05 NOTE — Progress Notes (Signed)
CCM called to notify that pt has gone into a afib rhythm with HR of 85. Patient is asleep but arousable and in no distress. On call notified. Will continue to monitor.

## 2019-02-05 NOTE — TOC Transition Note (Addendum)
Transition of Care [facility]) - CM/SW Discharge Note   Patient Details  Name: Madison Odom MRN: [id number] Date of Birth: February 12, 1934  [facility]) CM/SW Contact:  Mearl Latin, LCSW Phone Number: 02/05/2019, 10:48 AM   Clinical Narrative:    Patient will DC to: [facility] Anticipated DC date: 02/05/19 Family notified: Daughter, Madison Odom Transport by: Sharin Mons 11:30am   Per MD patient ready for DC to [facility]. RN, patient, patient's family, and facility notified of DC. Discharge Summary and FL2 sent to facility. RN to call report prior to discharge [phone] Room 3242). DC packet on chart. Ambulance transport requested for patient.   CSW will sign off for now as social work intervention is no longer needed. Please consult [place] again if new needs arise.  Cristobal Goldmann, LCSW Clinical Social Worker [phone]    Final next level of care: Skilled Nursing Facility Barriers to Discharge: No Barriers Identified   Patient Goals and CMS Choice Patient states their goals for this hospitalization and ongoing recovery are:: Rehab and then return home CMS Medicare.gov Compare Post Acute Care list provided to:: Patient Choice offered to / list presented to : Patient  Discharge Placement   Existing PASRR number confirmed : 02/04/19          Patient chooses bed at: [facility] Patient to be transferred to facility by: [facility] Name of family member notified: Daughter, Madison Odom Patient and family notified of of transfer: 02/05/19  Discharge Plan and Services In-house Referral: Clinical Social Work Discharge Planning Services: NA Post Acute Care Choice: Skilled Nursing Facility          DME Arranged: N/A DME Agency: NA HH Arranged: NA HH Agency: NA   Social Determinants of Health (SDOH) Interventions     Readmission Risk Interventions Readmission Risk Prevention Plan 02/04/2019  Transportation Screening Complete  PCP or Specialist Appt within 5-7  Days Complete  Home Care Screening Complete  Medication Review (RN CM) Complete  Some recent data might be hidden

## 2019-02-05 NOTE — Progress Notes (Signed)
Patient ID: Madison Odom, female   DOB: 17-Sep-1934, 85 y.o.   MRN: [id number] Patient is waiting to be discharged to SNF once bed is available.  Patient seen and examined at bedside.  He is medically stable for discharge.  Consider to the discharge summary done by Dr. Zannie Cove on 01/06/2019 for full details.

## 2019-02-05 NOTE — Progress Notes (Signed)
Nsg Discharge Note  Admit Date:  02/02/2019 Discharge date: 02/05/2019   Madison Odom to be D/C'd Skilled nursing facility per MD order.  AVS completed.  Copy for chart, and copy for patient signed, and dated. Patient/caregiver able to verbalize understanding.  Discharge Medication: Allergies as of 02/05/2019      Reactions   Phenothiazines Other (See Comments), Anaphylaxis   Unknown reaction   Prochlorperazine Anaphylaxis   Benadryl [diphenhydramine]    Agitation , confusion    Ferrous Sulfate    Abdominal pain/diarrhea   Penicillins    Yeast infections Did it involve swelling of the face/tongue/throat, SOB, or low BP? No Did it involve sudden or severe rash/hives, skin peeling, or any reaction on the inside of your mouth or nose? No Did you need to seek medical attention at a hospital or doctor's office? No When did it last happen?30 yrs If all above answers are "NO", may proceed with cephalosporin use.   Tetracycline    Other reaction(s): Other (See Comments) Yeast infection      Medication List    STOP taking these medications   aspirin 81 MG EC tablet     TAKE these medications   acetaminophen 325 MG tablet Commonly known as:  TYLENOL Take 1-2 tablets (325-650 mg total) by mouth every 4 (four) hours as needed for mild pain.   CALCIUM 500 +D PO Take 1 tablet by mouth daily.   clopidogrel 75 MG tablet Commonly known as:  PLAVIX Take 1 tablet (75 mg total) by mouth daily.   ferrous sulfate 325 (65 FE) MG tablet Take 1 tablet (325 mg total) by mouth daily at 12 noon.   multivitamin tablet Take 1 tablet by mouth daily.   nadolol 20 MG tablet Commonly known as:  CORGARD Take 1 tablet (20 mg total) by mouth daily. What changed:    medication strength  how much to take   pantoprazole 40 MG tablet Commonly known as:  PROTONIX Take 1 tablet (40 mg total) by mouth daily.   simvastatin 40 MG tablet Commonly known as:  ZOCOR Take 1 tablet (40 mg total)  by mouth daily at 6 PM.       Discharge Assessment: Vitals:   02/04/19 2120 02/05/19 0441  BP: 140/69 132/85  Pulse: 66 (!) 104  Resp: 18 18  Temp: (!) 97.5 F (36.4 C) 98 F (36.7 C)  SpO2: 95% 93%   IV catheter discontinued intact. Site without signs and symptoms of complications - no redness or edema noted at insertion site, patient denies c/o pain - only slight tenderness at site.  Dressing with slight pressure applied.   Patient escorted via WC, and D/C home via private auto.  Madison N Adrien Shankar, RN 02/05/2019 11:42 AM

## 2019-02-05 NOTE — TOC Progression Note (Signed)
Transition of Care [facility]) - Progression Note    Patient Details  Name: LYNDLEY JUVERA MRN: [id number] Date of Birth: 12-Mar-1934  Transition of Care [facility]) CM/SW Contact  Mearl Latin, LCSW Phone Number: 02/05/2019, 9:34 AM  Clinical Narrative:    [facility] has received insurance approval for discharge. [name] aware.   Expected Discharge Plan: Skilled Nursing Facility Barriers to Discharge: No Barriers Identified  Expected Discharge Plan and Services Expected Discharge Plan: Skilled Nursing Facility In-house Referral: Clinical Social Work Discharge Planning Services: NA Post Acute Care Choice: Skilled Nursing Facility Living arrangements for the past 2 months: Single Family Home Expected Discharge Date: 02/04/19               DME Arranged: N/A DME Agency: NA HH Arranged: NA HH Agency: NA   Social Determinants of Health (SDOH) Interventions    Readmission Risk Interventions Readmission Risk Prevention Plan 02/04/2019  Transportation Screening Complete  PCP or Specialist Appt within 5-7 Days Complete  Home Care Screening Complete  Medication Review (RN CM) Complete  Some recent data might be hidden

## 2019-02-06 ENCOUNTER — Telehealth (HOSPITAL_COMMUNITY): Payer: Self-pay | Admitting: *Deleted

## 2019-02-06 ENCOUNTER — Encounter: Payer: Self-pay | Admitting: Internal Medicine

## 2019-02-06 NOTE — Telephone Encounter (Signed)
Pt was contacted and notified of afib found on link.  Pt denies the access to video or computer.  Would prefer call on the phone.  I made provider aware, pt currently on plavix for prior stroke and placed on Rudi Coco, NPs schedule for Friday 9:00 am

## 2019-02-06 NOTE — Progress Notes (Signed)
LINQ alert received for AF episode, duration 4 hours, appears to be real. Not currently on OAC. ILR implanted for stroke. Will send to Dr Ladona Ridgel and AF clinic to call patient and offer virtual office visit to discuss anticoagulation.     Gypsy Balsam, NP 02/06/2019 4:02 PM

## 2019-02-07 ENCOUNTER — Other Ambulatory Visit: Payer: Self-pay

## 2019-02-07 ENCOUNTER — Telehealth: Payer: Self-pay | Admitting: *Deleted

## 2019-02-07 ENCOUNTER — Ambulatory Visit (HOSPITAL_COMMUNITY)
Admission: RE | Admit: 2019-02-07 | Discharge: 2019-02-07 | Disposition: A | Discharge status: Home / Self Care | Payer: Medicare Other | Source: Ambulatory Visit | Attending: Nurse Practitioner | Admitting: Nurse Practitioner

## 2019-02-07 DIAGNOSIS — I480 Paroxysmal atrial fibrillation: Secondary | ICD-10-CM

## 2019-02-07 NOTE — Progress Notes (Signed)
Virtual visit appt made for 3/27 with Rudi Coco NP

## 2019-02-07 NOTE — Telephone Encounter (Signed)
RN attempted to contact patient for TCM (hosiptal follow up). No answer. Left a message for patient to return call

## 2019-02-07 NOTE — Telephone Encounter (Signed)
Patient returned call. Patient was discharged to SNF.

## 2019-02-07 NOTE — Progress Notes (Signed)
Electrophysiology TeleHealth Note   Due to national recommendations of social distancing due to COVID 19, Audio  telehealth visit is felt to be most appropriate for this patient at this time.  See MyChart message/consent below  from today for patient consent regarding telehealth for the [facility].    Date:  02/07/2019   ID:  Madison Odom, DOB 15-Jul-1934, MRN [id number]  Location: SNF Provider location: [address] [place], [place] [address] Evaluation Performed: Follow up  PCP:  Shirline FreesNafziger, Cory, NP  Primary Cardiologist:   None Primary Electrophysiologist: Sharrell Kuaylor, Greg   WN:[id number]:Atrial fibrillation   History of Present Illness: Madison Odom is a 85 y.o. female who presents via her daughter  for audio  conferencing for a telehealth visit today.   The patient is referred for new afib via Linq yesterday showing 4 hours of afib, implanted at time of hospitalization for stroke  by Dr Ladona Ridgelaylor. She is currently in SNF. She was placed on Plavix at time of stroke.She did go back to the ED for a fall 3/3 at home and was d/ced to SNF. Per daughter falls are not a usual problem and she felt last fall was 2/2 to dehydration. She has not had any issues with bleeding since on plavix. No know bleeding issues..     Past Medical History:  Diagnosis Date  . Anemia   . GERD (gastroesophageal reflux disease)   . Hyperlipidemia   . Hypertension   . Hypertensive retinopathy   . MVP (mitral valve prolapse)   . OP (osteoporosis)   . Pelvis fracture (HCC) 09/2015   CLOSED   . Scoliosis   . Spastic colon   . Stroke [facility](HCC)    left side neglect   Past Surgical History:  Procedure Laterality Date  . ABDOMINAL HYSTERECTOMY  1965  . APPENDECTOMY    . BILATERAL OOPHORECTOMY  1981  . EYE SURGERY    . LOOP RECORDER INSERTION Left 01/13/2019   Procedure: LOOP RECORDER INSERTION;  Surgeon: Marinus Mawaylor, Gregg W, MD;  [facility];  Service: Cardiovascular;  Laterality: Left;  .  ROTATOR CUFF REPAIR Right   . TEE WITHOUT CARDIOVERSION N/A 01/13/2019   Procedure: TRANSESOPHAGEAL ECHOCARDIOGRAM (TEE);  Surgeon: Thurmon Fairroitoru, Mihai, MD;  [facility];  Service: Cardiovascular;  Laterality: N/A;  loop     Current Outpatient Medications  Medication Sig Dispense Refill  . acetaminophen (TYLENOL) 325 MG tablet Take 1-2 tablets (325-650 mg total) by mouth every 4 (four) hours as needed for mild pain.    . Calcium Carb-Cholecalciferol (CALCIUM 500 +D PO) Take 1 tablet by mouth daily.     . clopidogrel (PLAVIX) 75 MG tablet Take 1 tablet (75 mg total) by mouth daily. 30 tablet 0  . ferrous sulfate 325 (65 FE) MG tablet Take 1 tablet (325 mg total) by mouth daily at 12 noon. 30 tablet 3  . Multiple Vitamin (MULTIVITAMIN) tablet Take 1 tablet by mouth daily.    . nadolol (CORGARD) 20 MG tablet Take 1 tablet (20 mg total) by mouth daily.    . pantoprazole (PROTONIX) 40 MG tablet Take 1 tablet (40 mg total) by mouth daily. 30 tablet 0  . simvastatin (ZOCOR) 40 MG tablet Take 1 tablet (40 mg total) by mouth daily at 6 PM. 30 tablet 0   No current facility-administered medications for this encounter.     Allergies:   Phenothiazines; Prochlorperazine; Benadryl [diphenhydramine]; Ferrous sulfate; Penicillins; and Tetracycline  Social History:  The patient  reports that she has never smoked. She has never used smokeless tobacco. She reports that she does not drink alcohol or use drugs.   Family History:  The patient's  family history includes Heart attack in her mother; Heart disease in her father; Hypertension in her father and mother.    Exam:NA  Recent Labs: 01/15/2019: ALT 57 02/04/2019: BUN 20; Creatinine, Ser 0.59; Hemoglobin 9.2; Platelets 178; Potassium 3.6; Sodium 140  personally reviewed    Other studies personally reviewed: Epic records reviewed    ASSESSMENT AND PLAN:  1.  Paroxysmal  atrial fibrillation Linq revealed 4 hours of afib yesterday This  indicates that she should be changed from plavix to a DOAC to help prevent further stroke Will stop plavix and start eliquis 2.5 mg bid(age greater than 80 and weight of 76 lbs) Will add to schedule to call and see what her situation is in 30 days as it would be best if she had a cbc, bmet at that time Answered pt's daughter's questions Stress fall precautions and bleeding precautions discussed  Nursing facility to be contacted re med changes    This patients CHA2DS2-VASc Score and unadjusted Ischemic Stroke Rate (% per year) is equal to 7.2 % stroke rate/year from a score of 5  Above score calculated as 1 point each if present [CHF, HTN, DM, Vascular=MI/PAD/Aortic Plaque, Age if 65-74, or Female] Above score calculated as 2 points each if present [Age > 75, or Stroke/TIA/TE]   Follow-up: 1 MONTH  Current medicines are reviewed at length with the [name] today.   The [name] does not have concerns regarding her medicines.  The following changes were made today:  plvix stopped/eliquis 2.5 mg bid started  Labs/ tests ordered today include: none No orders of the defined types were placed in this encounter.   Patient Risk:  after full review of this patients clinical status, I feel that they are at high risk at this time.   Today, I have spent 12 minutes with the patient with telehealth technology discussing changed in blood thinner.    Signed Rudi Coco NP 02/07/2019 9:36 AM  Afib Clinic [facility] [address] [place], [place] [address] [phone]   I hereby voluntarily request, consent and authorize the [facility] and its employed or contracted physicians, physician assistants, nurse practitioners or other licensed health care professionals (the Practitioner), to provide me with telemedicine health care services (the "Services") as deemed necessary by the treating Practitioner. I acknowledge and consent to receive the  Services by the Practitioner via telemedicine. I understand that the telemedicine visit will involve communicating with the Practitioner through live audiovisual communication technology and the disclosure of certain medical information by electronic transmission. I acknowledge that I have been given the opportunity to request an in-person assessment or other available alternative prior to the telemedicine visit and am voluntarily participating in the telemedicine visit.   I understand that I have the right to withhold or withdraw my consent to the use of telemedicine in the course of my care at any time, without affecting my right to future care or treatment, and that the Practitioner or I may terminate the telemedicine visit at any time. I understand that I have the right to inspect all information obtained and/or recorded in the course of the telemedicine visit and may receive copies of available information for a reasonable fee.  I understand that some of the potential risks of  receiving the Services via telemedicine include:   Delay or interruption in medical evaluation due to technological equipment failure or disruption;  Information transmitted may not be sufficient (e.g. poor resolution of images) to allow for appropriate medical decision making by the Practitioner; and/or  In rare instances, security protocols could fail, causing a breach of personal health information.   Furthermore, I acknowledge that it is my responsibility to provide information about my medical history, conditions and care that is complete and accurate to the best of my ability. I acknowledge that Practitioner's advice, recommendations, and/or decision may be based on factors not within their control, such as incomplete or inaccurate data provided by me or distortions of diagnostic images or specimens that may result from electronic transmissions. I understand that the practice of medicine is not an exact science and that  Practitioner makes no warranties or guarantees regarding treatment outcomes. I acknowledge that I will receive a copy of this consent concurrently upon execution via email to the email address I last provided but may also request a printed copy by calling the office of the [facility].  I understand that my insurance will be billed for this visit.   I have read or had this consent read to me.  I understand the contents of this consent, which adequately explains the benefits and risks of the Services being provided via telemedicine.  I have been provided ample opportunity to ask questions regarding this consent and the Services and have had my questions answered to my satisfaction.  I give my informed consent for the services to be provided through the use of telemedicine in my medical care  By participating in this telemedicine visit I agree to the above.

## 2019-02-08 LAB — CULTURE, BLOOD (ROUTINE X 2)
CULTURE: NO GROWTH
CULTURE: NO GROWTH

## 2019-02-09 ENCOUNTER — Other Ambulatory Visit: Payer: Self-pay | Admitting: Physical Medicine and Rehabilitation

## 2019-02-10 ENCOUNTER — Encounter: Payer: Medicare Other | Admitting: Physical Therapy

## 2019-02-10 ENCOUNTER — Telehealth (HOSPITAL_COMMUNITY): Payer: Self-pay | Admitting: *Deleted

## 2019-02-10 NOTE — Telephone Encounter (Signed)
Verbal orders given to [facility] RN at [facility] facility. Stop plavix. Start Eliquis 2.5mg  BID. CBC/BMET to be drawn and results faxed in 1 month.

## 2019-02-12 ENCOUNTER — Ambulatory Visit: Payer: Medicare Other | Admitting: Physical Therapy

## 2019-02-17 ENCOUNTER — Other Ambulatory Visit: Payer: Self-pay

## 2019-02-17 ENCOUNTER — Ambulatory Visit (INDEPENDENT_AMBULATORY_CARE_PROVIDER_SITE_OTHER): Payer: Medicare Other | Admitting: *Deleted

## 2019-02-17 DIAGNOSIS — I639 Cerebral infarction, unspecified: Secondary | ICD-10-CM

## 2019-02-17 LAB — CUP PACEART REMOTE DEVICE CHECK
Date Time Interrogation Session: 20200404203823
Implantable Pulse Generator Implant Date: 20200302

## 2019-02-25 ENCOUNTER — Ambulatory Visit: Payer: Medicare Other | Admitting: Physical Medicine & Rehabilitation

## 2019-02-25 NOTE — Progress Notes (Signed)
Carelink Summary Report / Loop Recorder 

## 2019-02-27 ENCOUNTER — Inpatient Hospital Stay: Payer: Medicare Other | Admitting: Adult Health

## 2019-02-28 ENCOUNTER — Other Ambulatory Visit: Payer: Self-pay | Admitting: Cardiology

## 2019-02-28 DIAGNOSIS — I272 Pulmonary hypertension, unspecified: Secondary | ICD-10-CM

## 2019-02-28 DIAGNOSIS — I10 Essential (primary) hypertension: Secondary | ICD-10-CM

## 2019-03-04 ENCOUNTER — Telehealth: Payer: Self-pay | Admitting: Adult Health

## 2019-03-04 NOTE — Telephone Encounter (Signed)
Spoke to [place] and notified her to proceed with the orders.

## 2019-03-04 NOTE — Telephone Encounter (Signed)
Verbal orders ok

## 2019-03-04 NOTE — Telephone Encounter (Signed)
Copied from CRM [id number]. Topic: Quick Communication - Home Health Verbal Orders >> Mar 04, 2019 12:11 PM De[address]ah Loop wrote: Caller/Agency: Vladimir Faster [facility] Callback Number: [phone] Requesting OT/PT/Skilled Nursing/Social Work/Speech Therapy: OT Frequency: 2x a wk for 4 wks

## 2019-03-11 ENCOUNTER — Encounter: Payer: Self-pay | Admitting: *Deleted

## 2019-03-11 ENCOUNTER — Telehealth: Payer: Self-pay | Admitting: *Deleted

## 2019-03-11 NOTE — Telephone Encounter (Addendum)
Virtual Visit Pre-Appointment Phone Call  "(Name), I am calling you today to discuss your upcoming appointment. We are currently trying to limit exposure to the virus that causes COVID-19 by seeing patients at home rather than in the office."  1. "What is the BEST phone number to call the day of the visit?" - include this in appointment notes  2. Do you have or have access to (through a family member/friend) a smartphone with video capability that we can use for your visit?"  a. If no - list the appointment type as a PHONE visit in appointment notes PT WILL BE TELEPHONE VISIT-SHE DOES NOT HAVE A SMART PHONE-UPDATED NUMBER IN NOTES  3. Confirm consent - "In the setting of the current Covid19 crisis, you are scheduled for a (phone ) visit with Dr. Delton SeeNelson on 03/27/19 at 0840.  Just as we do with many in-office visits, in order for you to participate in this visit, we must obtain consent.  If you'd like, I can send this to your mychart (if signed up) or email for you to review.  Otherwise, I can obtain your verbal consent now.  All virtual visits are billed to your insurance company just like a normal visit would be.  By agreeing to a virtual visit, we'd like you to understand that the technology does not allow for your provider to perform an examination, and thus may limit your provider's ability to fully assess your condition. If your provider identifies any concerns that need to be evaluated in person, we will make arrangements to do so.  Finally, though the technology is pretty good, we cannot assure that it will always work on either your or our end, and in the setting of a video visit, we may have to convert it to a phone-only visit.  In either situation, we cannot ensure that we have a secure connection.  Are you willing to proceed?" STAFF: Did the patient verbally acknowledge consent to telehealth visit? Document YES/NO here: YES PT GAVE VERBAL CONSENT FOR DR NELSON TO TREAT HER VIA TELEPHONE  VISIT AND CONSENT WAS ALSO SENT FOR [place].  4. Advise patient to be prepared - "Two hours prior to your appointment, go ahead and check your blood pressure, pulse, oxygen saturation, and your weight (if you have the equipment to check those) and write them all down. When your visit starts, your provider will ask you for this information. If you have an Apple Watch or Kardia device, please plan to have heart rate information ready on the day of your appointment. Please have a pen and paper handy nearby the day of the visit as well." YES ADVISED HER OF THIS. [facility] SO I CALLED TONYA AT [phone] AND SHE WILL MAKE ARRANGEMENTS FOR THE PATIENTS PT/OT TO TAKE HER BP/HR THAT MORNING IF POSSIBLE OR THEY WILL DO IT THE DAY BEFORE WHEN THEY SEE HER FOR THERAPY, AND THEY WILL WRITE HER BP/HR AS WELL AS HER WEIGHT ON A  PIECE OF PAPER AND GIVE IT TO THE PT, SO THAT SHE WILL HAVE AVAILABLE TO GIVE TO OUR CMA WHEN SHE CALLS MS. Madison Odom TO START THE "ROOMING PROCESS."  5. Give patient instructions for how her TELEPHONE VISIT will go with Dr Delton SeeNelson that day.  6. Inform patient they will receive a phone call 15 minutes prior to their appointment time (may be from unknown caller ID) so they should be prepared to answer  PT  LIVES AT [facility] SO I CALLED TONYA AT [phone] AND SHE WILL MAKE ARRANGEMENTS FOR THE PATIENTS PT/OT TO TAKE HER BP/HR THAT MORNING IF POSSIBLE OR THEY WILL DO IT THE DAY BEFORE WHEN THEY SEE HER FOR THERAPY, AND THEY WILL WRITE HER BP/HR AS WELL AS HER WEIGHT ON A  PIECE OF PAPER AND GIVE IT TO THE PT, SO THAT SHE WILL HAVE AVAILABLE TO GIVE TO OUR CMA WHEN SHE CALLS MS. Madison Odom TO START THE "ROOMING PROCESS."    TELEPHONE CALL NOTE  Madison Odom has been deemed a candidate for a follow-up tele-health visit to limit community exposure during the Covid-19 pandemic. I spoke with the patient via phone to ensure availability of  phone/video source, confirm preferred email & phone number, and discuss instructions and expectations.  I reminded Madison Odom to be prepared with any vital sign and/or heart rhythm information that could potentially be obtained via home monitoring, at the time of her visit. I reminded Madison Odom to expect a phone call prior to her visit.  Madison Socks, LPN 0/17/5102 58:52 PM      FULL LENGTH CONSENT FOR TELE-HEALTH VISIT   I hereby voluntarily request, consent and authorize [facility] and its employed or contracted physicians, physician assistants, nurse practitioners or other licensed health care professionals (the Practitioner), to provide me with telemedicine health care services (the Services") as deemed necessary by the treating Practitioner. I acknowledge and consent to receive the Services by the Practitioner via telemedicine. I understand that the telemedicine visit will involve communicating with the Practitioner through live audiovisual communication technology and the disclosure of certain medical information by electronic transmission. I acknowledge that I have been given the opportunity to request an in-person assessment or other available alternative prior to the telemedicine visit and am voluntarily participating in the telemedicine visit.  I understand that I have the right to withhold or withdraw my consent to the use of telemedicine in the course of my care at any time, without affecting my right to future care or treatment, and that the Practitioner or I may terminate the telemedicine visit at any time. I understand that I have the right to inspect all information obtained and/or recorded in the course of the telemedicine visit and may receive copies of available information for a reasonable fee.  I understand that some of the potential risks of receiving the Services via telemedicine include:   Delay or interruption in medical evaluation due to technological equipment  failure or disruption;  Information transmitted may not be sufficient (e.g. poor resolution of images) to allow for appropriate medical decision making by the Practitioner; and/or   In rare instances, security protocols could fail, causing a breach of personal health information.  Furthermore, I acknowledge that it is my responsibility to provide information about my medical history, conditions and care that is complete and accurate to the best of my ability. I acknowledge that Practitioner's advice, recommendations, and/or decision may be based on factors not within their control, such as incomplete or inaccurate data provided by me or distortions of diagnostic images or specimens that may result from electronic transmissions. I understand that the practice of medicine is not an exact science and that Practitioner makes no warranties or guarantees regarding treatment outcomes. I acknowledge that I will receive a copy of this consent concurrently upon execution via email to the email address I last provided but may also request a printed copy by calling the office of  [facility].    I understand that my insurance will be billed for this visit.   I have read or had this consent read to me.  I understand the contents of this consent, which adequately explains the benefits and risks of the Services being provided via telemedicine.   I have been provided ample opportunity to ask questions regarding this consent and the Services and have had my questions answered to my satisfaction.  I give my informed consent for the services to be provided through the use of telemedicine in my medical care  By participating in this telemedicine visit I agree to the above. PT GAVE VERBAL CONSENT FOR DR NELSON TO TREAT HER VIA TELEPHONE VISIT AS WELL AS SHE IS AWARE THAT THE CONSENT WILL BE SENT TO HER MYCHART FOR HER TO REVIEW.

## 2019-03-13 ENCOUNTER — Encounter (HOSPITAL_COMMUNITY): Payer: Self-pay | Admitting: Nurse Practitioner

## 2019-03-13 ENCOUNTER — Ambulatory Visit (HOSPITAL_COMMUNITY): Payer: Medicare Other | Admitting: Nurse Practitioner

## 2019-03-13 ENCOUNTER — Other Ambulatory Visit: Payer: Self-pay

## 2019-03-13 ENCOUNTER — Ambulatory Visit (HOSPITAL_COMMUNITY)
Admission: RE | Admit: 2019-03-13 | Discharge: 2019-03-13 | Disposition: A | Discharge status: Home / Self Care | Payer: Medicare Other | Source: Ambulatory Visit | Attending: Nurse Practitioner | Admitting: Nurse Practitioner

## 2019-03-13 VITALS — Ht <= 58 in

## 2019-03-13 DIAGNOSIS — I480 Paroxysmal atrial fibrillation: Secondary | ICD-10-CM | POA: Diagnosis not present

## 2019-03-13 MED ORDER — APIXABAN 2.5 MG PO TABS: 2.5000 mg | ORAL_TABLET | Freq: Two times a day (BID) | ORAL | Status: DC

## 2019-03-13 NOTE — Addendum Note (Signed)
Encounter addended by: Newman Nip, NP on: 03/13/2019 2:01 PM  Actions taken: Order Reconciliation Section accessed

## 2019-03-13 NOTE — Addendum Note (Signed)
Encounter addended by: Newman Nip, NP on: 03/13/2019 2:06 PM  Actions taken: Medication List reviewed, Order Reconciliation Section accessed, Clinical Note Signed

## 2019-03-13 NOTE — Addendum Note (Signed)
Encounter addended by: Shona Simpson, RN on: 03/13/2019 2:01 PM  Actions taken: Order Reconciliation Section accessed, Home Medications modified, Medication List reviewed

## 2019-03-13 NOTE — Progress Notes (Addendum)
Electrophysiology TeleHealth Note   Due to national recommendations of social distancing due to COVID 19, Audio  telehealth visit is felt to be most appropriate for this patient at this time.  See MyChart message/consent below  from today for patient consent regarding telehealth for the [facility].    Date:  03/13/2019   ID:  Cheryl FlashPatsy A Tickner, DOB 20-Sep-1934, MRN [id number]  Location: SNF Provider location: [address] [place], [place] [address] Evaluation Performed: Follow up  PCP:  Shirline FreesNafziger, Cory, NP  Primary Cardiologist:   None Primary Electrophysiologist: Sharrell Kuaylor, Greg   XB:JYNWGNCC:Atrial fibrillation   History of Present Illness: Madison Odom is a 85 y.o. female who presents via her daughter  for audio  conferencing for a telehealth visit today.   The patient is referred for new afib via Linq yesterday showing 4 hours of afib, implanted at time of hospitalization for stroke  by Dr Ladona Ridgelaylor. She is currently in SNF. She was placed on Plavix at time of stroke.She did go back to the ED for a fall 3/3 at home and was d/ced to SNF. Per daughter falls are not a usual problem and she felt last fall was 2/2 to dehydration. She has not had any issues with bleeding since on plavix. No know bleeding issues.  03/13/19- Had f/u telephone call with daughter today as her mother is still in a nursing facility and has been transferred [facility] [address] since 2 weeks ago Assisted Care as she will not be able to go back home. The pt   was to have a  f/u cbc on Doac while at her initial nursing facility that I ordered, but I have not seen a faxed result note come thru. She does not know of any issues with her mom. No bleeding issues.    Past Medical History:  Diagnosis Date  . Anemia   . GERD (gastroesophageal reflux disease)   . Hyperlipidemia   . Hypertension   . Hypertensive retinopathy   . MVP (mitral valve prolapse)   . OP (osteoporosis)   . Pelvis fracture (HCC)  09/2015   CLOSED   . Scoliosis   . Spastic colon   . Stroke [facility](HCC)    left side neglect   Past Surgical History:  Procedure Laterality Date  . ABDOMINAL HYSTERECTOMY  1965  . APPENDECTOMY    . BILATERAL OOPHORECTOMY  1981  . EYE SURGERY    . LOOP RECORDER INSERTION Left 01/13/2019   Procedure: LOOP RECORDER INSERTION;  Surgeon: Marinus Mawaylor, Gregg W, MD;  [facility];  Service: Cardiovascular;  Laterality: Left;  . ROTATOR CUFF REPAIR Right   . TEE WITHOUT CARDIOVERSION N/A 01/13/2019   Procedure: TRANSESOPHAGEAL ECHOCARDIOGRAM (TEE);  Surgeon: Thurmon Fairroitoru, Mihai, MD;  [facility];  Service: Cardiovascular;  Laterality: N/A;  loop     Allergies:   Phenothiazines; Prochlorperazine; Benadryl [diphenhydramine]; Ferrous sulfate; Penicillins; and Tetracycline   Social History:  The patient  reports that she has never smoked. She has never used smokeless tobacco. She reports that she does not drink alcohol or use drugs.   Family History:  The patient's  family history includes Heart attack in her mother; Heart disease in her father; Hypertension in her father and mother.    Exam:NA  Recent Labs: 01/15/2019: ALT 57 02/04/2019: BUN 20; Creatinine, Ser 0.59; Hemoglobin 9.2; Platelets 178; Potassium 3.6; Sodium 140  personally reviewed    Other studies personally reviewed: Epic records  reviewed    ASSESSMENT AND PLAN:  1.  Paroxysmal  atrial fibrillation Linq revealed 4 hours of afib yesterday This indicated that she should be changed from plavix to a DOAC to help prevent further stroke Plavix was stopped and started eliquis 2.5 mg bid(age greater than 80 and weight of 76 lbs) I will try to order cbc, bmet at [facility] on DOAC Answered pt's daughter's concerns Stress fall precautions and bleeding precautions discussed    F/u with Dr. Delton See 5/14 and neurology in [place] Linq/afib per device clinic    This patients CHA2DS2-VASc Score and unadjusted Ischemic  Stroke Rate (% per year) is equal to 7.2 % stroke rate/year from a score of 5  Above score calculated as 1 point each if present [CHF, HTN, DM, Vascular=MI/PAD/Aortic Plaque, Age if 65-74, or Female] Above score calculated as 2 points each if present [Age > 75, or Stroke/TIA/TE]  Current medicines are reviewed at length with the [name] today.   The [name] does not have concerns regarding her medicines.  The following changes were made today:  plvix stopped/eliquis 2.5 mg bid started  Labs/ tests ordered today include: cbc/bmet No orders of the defined types were placed in this encounter.   Patient Risk:  after full review of this patients clinical status, I feel that they are at high risk at this time.   Today, I have spent 10 minutes with the patient with telehealth technology discussing changed in blood thinner.    Signed Rudi Coco NP 03/13/2019 1:40 PM  [facility] [address] [place], [place] [address] [phone]   I hereby voluntarily request, consent and authorize the [facility] and its employed or contracted physicians, physician assistants, nurse practitioners or other licensed health care professionals (the Practitioner), to provide me with telemedicine health care services (the "Services") as deemed necessary by the treating Practitioner. I acknowledge and consent to receive the Services by the Practitioner via telemedicine. I understand that the telemedicine visit will involve communicating with the Practitioner through live audiovisual communication technology and the disclosure of certain medical information by electronic transmission. I acknowledge that I have been given the opportunity to request an in-person assessment or other available alternative prior to the telemedicine visit and am voluntarily participating in the telemedicine visit.   I understand that I have the right to withhold or withdraw my  consent to the use of telemedicine in the course of my care at any time, without affecting my right to future care or treatment, and that the Practitioner or I may terminate the telemedicine visit at any time. I understand that I have the right to inspect all information obtained and/or recorded in the course of the telemedicine visit and may receive copies of available information for a reasonable fee.  I understand that some of the potential risks of receiving the Services via telemedicine include:   Delay or interruption in medical evaluation due to technological equipment failure or disruption;  Information transmitted may not be sufficient (e.g. poor resolution of images) to allow for appropriate medical decision making by the Practitioner; and/or  In rare instances, security protocols could fail, causing a breach of personal health information.   Furthermore, I acknowledge that it is my responsibility to provide information about my medical history, conditions and care that is complete and accurate to the best of my ability. I acknowledge that Practitioner's advice, recommendations, and/or decision may be based on factors not within their control, such  as incomplete or inaccurate data provided by me or distortions of diagnostic images or specimens that may result from electronic transmissions. I understand that the practice of medicine is not an exact science and that Practitioner makes no warranties or guarantees regarding treatment outcomes. I acknowledge that I will receive a copy of this consent concurrently upon execution via email to the email address I last provided but may also request a printed copy by calling the office of the [facility] Clinic.  I understand that my insurance will be billed for this visit.   I have read or had this consent read to me.  I understand the contents of this consent, which adequately explains the benefits and risks of the Services being provided via  telemedicine.  I have been provided ample opportunity to ask questions regarding this consent and the Services and have had my questions answered to my satisfaction.  I give my informed consent for the services to be provided through the use of telemedicine in my medical care  By participating in this telemedicine visit I agree to the above.

## 2019-03-20 ENCOUNTER — Ambulatory Visit (INDEPENDENT_AMBULATORY_CARE_PROVIDER_SITE_OTHER): Payer: Medicare Other | Admitting: *Deleted

## 2019-03-20 ENCOUNTER — Other Ambulatory Visit: Payer: Self-pay

## 2019-03-20 DIAGNOSIS — I639 Cerebral infarction, unspecified: Secondary | ICD-10-CM

## 2019-03-21 LAB — CUP PACEART REMOTE DEVICE CHECK
Date Time Interrogation Session: 20200507213940
Implantable Pulse Generator Implant Date: 20200302

## 2019-03-25 NOTE — Progress Notes (Signed)
Carelink Summary Report / Loop Recorder 

## 2019-03-27 ENCOUNTER — Other Ambulatory Visit: Payer: Self-pay

## 2019-03-27 ENCOUNTER — Encounter: Payer: Self-pay | Admitting: *Deleted

## 2019-03-27 ENCOUNTER — Encounter: Payer: Self-pay | Admitting: Cardiology

## 2019-03-27 ENCOUNTER — Telehealth (INDEPENDENT_AMBULATORY_CARE_PROVIDER_SITE_OTHER): Payer: Medicare Other | Admitting: Cardiology

## 2019-03-27 VITALS — BP 141/82 | Ht <= 58 in | Wt <= 1120 oz

## 2019-03-27 DIAGNOSIS — I951 Orthostatic hypotension: Secondary | ICD-10-CM

## 2019-03-27 DIAGNOSIS — I272 Pulmonary hypertension, unspecified: Secondary | ICD-10-CM

## 2019-03-27 DIAGNOSIS — E782 Mixed hyperlipidemia: Secondary | ICD-10-CM

## 2019-03-27 DIAGNOSIS — I480 Paroxysmal atrial fibrillation: Secondary | ICD-10-CM

## 2019-03-27 DIAGNOSIS — I10 Essential (primary) hypertension: Secondary | ICD-10-CM

## 2019-03-27 NOTE — Patient Instructions (Signed)
Medication Instructions:   Your physician recommends that you continue on your current medications as directed. Please refer to the Current Medication list given to you today.  If you need a refill on your cardiac medications before your next appointment, please call your pharmacy.    Lab work:  WILL BE SAME DAY/MORNING  AS YOUR FOLLOW-UP APPOINTMENT WITH DR NELSON-YOUR LAB IS SCHEDULED FOR 08/05/19 BEFORE 9 AM-TO CHECK--CMET AND CBC W DIFF  If you have labs (blood work) drawn today and your tests are completely normal, you will receive your results only by: Marland Kitchen MyChart Message (if you have MyChart) OR . A paper copy in the mail If you have any lab test that is abnormal or we need to change your treatment, we will call you to review the results.    Follow-Up:  WITH DR Delton See AS A REGULAR OFFICE VISIT ON August 05, 2019 AT 9:20 AM. YOU WILL NEED TO HAVE YOU LABS DONE THE SAME MORNING, JUST PRIOR TO YOUR APPOINTMENT WITH DR Delton See.

## 2019-03-27 NOTE — Progress Notes (Signed)
Virtual Visit via Telephone Note   This visit type was conducted due to national recommendations for restrictions regarding the COVID-19 Pandemic (e.g. social distancing) in an effort to limit this patient's exposure and mitigate transmission in our community.  Due to her co-morbid illnesses, this patient is at least at moderate risk for complications without adequate follow up.  This format is felt to be most appropriate for this patient at this time.  The patient did not have access to video technology/had technical difficulties with video requiring transitioning to audio format only (telephone).  All issues noted in this document were discussed and addressed.  No physical exam could be performed with this format.  Please refer to the patient's chart for her  consent to telehealth for [facility].   Date:  03/27/2019   ID:  Madison Odom, DOB 1934/07/22, MRN [id number]  Patient Location: Skilled Nursing Facility Provider Location: Home  PCP:  [name], NP  Cardiologist: Dr Delton See Electrophysiologist:  None   Evaluation Performed:  Follow-Up Visit  Chief Complaint:  none  History of Present Illness:    Madison Odom is a 85 y.o. female with history of pulmonary hypertension, recent hospitalization for stroke after which she was transferred to skilled nursing facility for rehabilitation.  She underwent TEE in March 2020 that showed no source of cardiac embolism and Linq device was implanted and showed 4-hour episode of A. fib for which she was started on Eliquis.  Today she states she is doing well, she walks with a walker, she had one episode of presyncope, she denies any bleeding, no palpitations lower extremity edema or shortness of breath.  The patient does not have symptoms concerning for COVID-19 infection (fever, chills, cough, or new shortness of breath).    Past Medical History:  Diagnosis Date  . Anemia   . GERD (gastroesophageal reflux disease)   . Hyperlipidemia    . Hypertension   . Hypertensive retinopathy   . MVP (mitral valve prolapse)   . OP (osteoporosis)   . Pelvis fracture (HCC) 09/2015   CLOSED   . Scoliosis   . Spastic colon   . Stroke [facility])    left side neglect   Past Surgical History:  Procedure Laterality Date  . ABDOMINAL HYSTERECTOMY  1965  . APPENDECTOMY    . BILATERAL OOPHORECTOMY  1981  . EYE SURGERY    . LOOP RECORDER INSERTION Left 01/13/2019   Procedure: LOOP RECORDER INSERTION;  Surgeon: Marinus Maw, MD;  Location: [facility] INVASIVE CV LAB;  Service: Cardiovascular;  Laterality: Left;  . ROTATOR CUFF REPAIR Right   . TEE WITHOUT CARDIOVERSION N/A 01/13/2019   Procedure: TRANSESOPHAGEAL ECHOCARDIOGRAM (TEE);  Surgeon: Thurmon Fair, MD;  Location: [facility] ENDOSCOPY;  Service: Cardiovascular;  Laterality: N/A;  loop     Current Meds  Medication Sig  . acetaminophen (TYLENOL) 325 MG tablet Take 1-2 tablets (325-650 mg total) by mouth every 4 (four) hours as needed for mild pain.  Marland Kitchen apixaban (ELIQUIS) 2.5 MG TABS tablet Take 2.5 mg by mouth 2 (two) times daily.  . Calcium Carb-Cholecalciferol (CALCIUM 500 +D PO) Take 1 tablet by mouth daily.   . ferrous sulfate 325 (65 FE) MG tablet Take 1 tablet (325 mg total) by mouth daily at 12 noon.  . loperamide (IMODIUM) 2 MG capsule Take 1 capsule by mouth as needed.  . Multiple Vitamin (MULTIVITAMIN) tablet Take 1 tablet by mouth daily.  . nadolol (CORGARD) 20 MG tablet Take 1 tablet (  20 mg total) by mouth daily.  . pantoprazole (PROTONIX) 40 MG tablet TAKE 1 TABLET BY MOUTH EVERY DAY  . simvastatin (ZOCOR) 40 MG tablet TAKE 1 TABLET BY MOUTH EVERY DAY AT 6 PM.  . traZODone (DESYREL) 50 MG tablet Take 50 mg by mouth at bedtime.     Allergies:   Phenothiazines; Prochlorperazine; Benadryl [diphenhydramine]; Ferrous sulfate; Penicillins; and Tetracycline   Social History   Tobacco Use  . Smoking status: Never Smoker  . Smokeless tobacco: Never Used  Substance Use Topics  . Alcohol  use: No  . Drug use: No     Family Hx: The patient's family history includes Heart attack in her mother; Heart disease in her father; Hypertension in her father and mother. There is no history of Stroke.  ROS:   Please see the history of present illness.    All other systems reviewed and are negative.   Prior CV studies:   The following studies were reviewed today:  TEE: 01/13/2019 The left ventricle has normal systolic function, with an ejection fraction of 55-60%. The cavity size was normal. No evidence of left ventricular regional wall motion abnormalities.  2. The right ventricle has normal systolc function. The cavity was normal. There is no increase in right ventricular wall thickness.  3. Left atrial size was moderately dilated.  4. Mild mitral valve prolapse. Trivial mitral insufficiency.  5. Aortic valve sclerosis without stenosis. Mild aortic valve regurgitation.   Labs/Other Tests and Data Reviewed:    EKG:  No ECG reviewed.  Recent Labs: 01/15/2019: ALT 57 02/04/2019: BUN 20; Creatinine, Ser 0.59; Hemoglobin 9.2; Platelets 178; Potassium 3.6; Sodium 140   Recent Lipid Panel Lab Results  Component Value Date/Time   CHOL 158 01/10/2019 06:02 AM   TRIG 86 01/10/2019 06:02 AM   HDL 61 01/10/2019 06:02 AM   CHOLHDL 2.6 01/10/2019 06:02 AM   LDLCALC 80 01/10/2019 06:02 AM    Wt Readings from Last 3 Encounters:  03/27/19 68 lb (30.8 kg)  02/02/19 76 lb 11.5 oz (34.8 kg)  01/29/19 76 lb 12.8 oz (34.8 kg)     Objective:    Vital Signs:  BP (!) 141/82   Ht 4\' 10"  (1.473 m)   Wt 68 lb (30.8 kg)   BMI 14.21 kg/m    VITAL SIGNS:  reviewed  ASSESSMENT & PLAN:    1. Paroxysmal atrial fibrillation, status post recent stroke, started on Eliquis, no bleeding, will continue, she is currently in sinus rhythm. 2. Hypertension -borderline however episodes of orthostatic hypotension in the past, will continue same regimen 3. Hyperlipidemia, on simvastatin all lipids at  goal in February 2020 4. Pulmonary hypertension -improved.  COVID-19 Education: The signs and symptoms of COVID-19 were discussed with the patient and how to seek care for testing (follow up with PCP or arrange E-visit). The importance of social distancing was discussed today.  Time:   Today, I have spent 25 minutes with the patient with telehealth technology discussing the above problems.     Medication Adjustments/Labs and Tests Ordered: Current medicines are reviewed at length with the patient today.  Concerns regarding medicines are outlined above.   Tests Ordered: No orders of the defined types were placed in this encounter.   Medication Changes: No orders of the defined types were placed in this encounter.   Disposition:  Follow up in 4 month(s)  Signed, Tobias AlexanderKatarina Dakotah Heiman, MD  03/27/2019 8:56 AM    [facility]

## 2019-03-27 NOTE — Addendum Note (Signed)
Addended by: Loa Socks on: 03/27/2019 09:25 AM   Modules accepted: Orders

## 2019-04-17 ENCOUNTER — Inpatient Hospital Stay: Payer: Medicare Other | Admitting: Adult Health

## 2019-04-22 ENCOUNTER — Ambulatory Visit (INDEPENDENT_AMBULATORY_CARE_PROVIDER_SITE_OTHER): Payer: Medicare Other | Admitting: *Deleted

## 2019-04-22 DIAGNOSIS — I639 Cerebral infarction, unspecified: Secondary | ICD-10-CM

## 2019-04-23 LAB — CUP PACEART REMOTE DEVICE CHECK
Date Time Interrogation Session: 20200609220645
Implantable Pulse Generator Implant Date: 20200302

## 2019-04-28 IMAGING — CT CT HEAD WITHOUT CONTRAST
4 series · 16 of 47 positions shown, 18 images · non-contrast
Comparison: Brain MRI 01/10/2019. Head CT 01/09/2019.

CLINICAL DATA: 84-year-old female with unwitnessed fall. On blood
thinners. Right M2 occlusion with posterior right MCA infarct last
month.

EXAM:
CT HEAD WITHOUT CONTRAST
TECHNIQUE: Contiguous axial images were obtained from the base of the skull
through the vertex without intravenous contrast.

[Series 3: head without · axial · non-contrast · 0.40mm/px · z∈[-114,+21]mm · 7 of 37 slices shown, 9 images]
[im 5/37  brain]
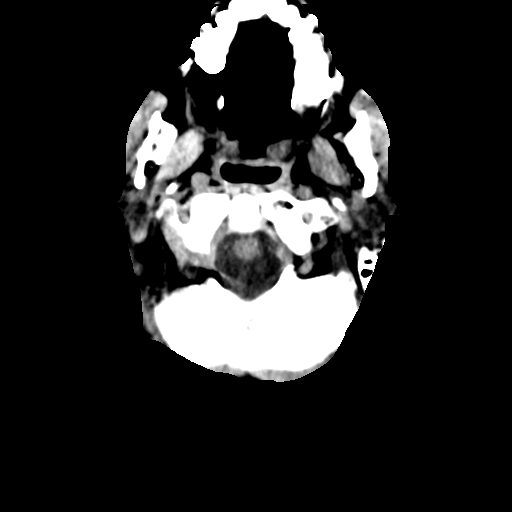
[im 5/37  bone]
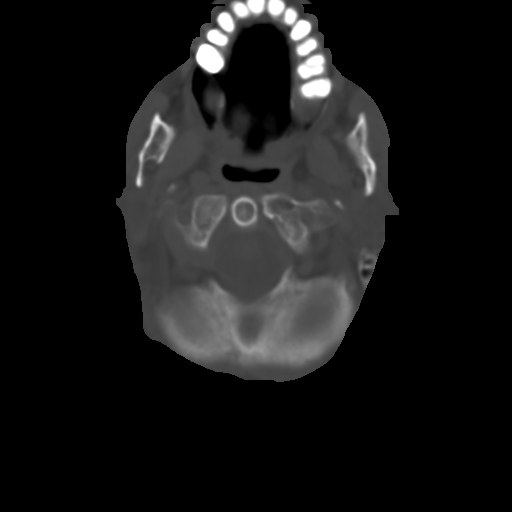
[im 10/37  brain]
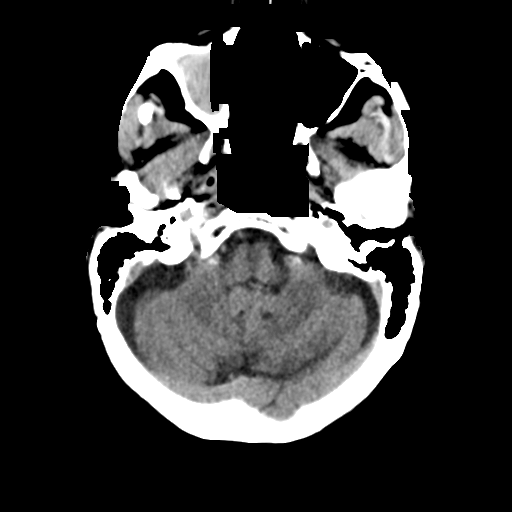
[im 14/37  brain]
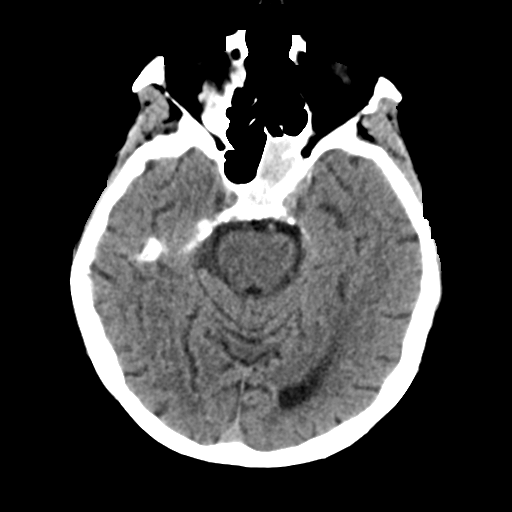
[im 19/37  brain]
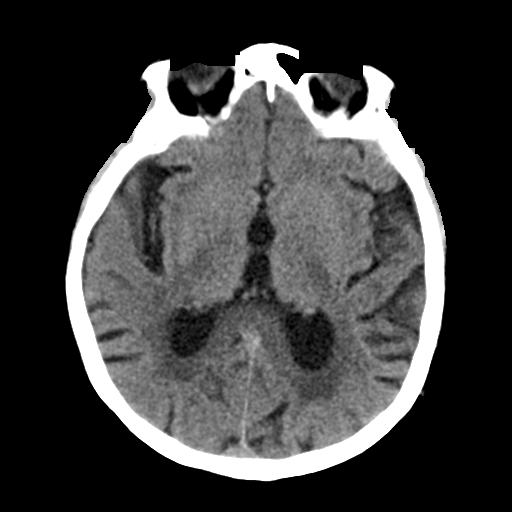
[im 23/37  brain]
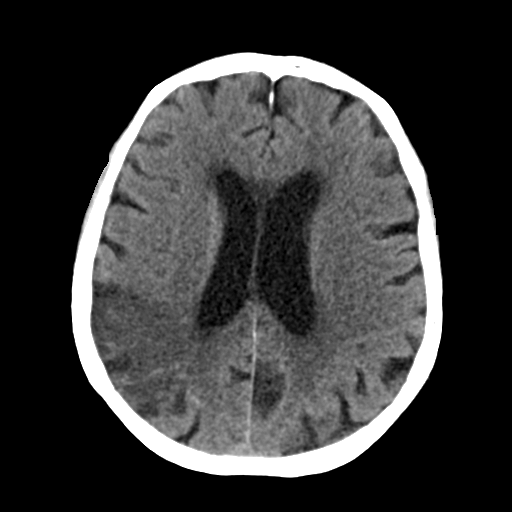
[im 23/37  bone]
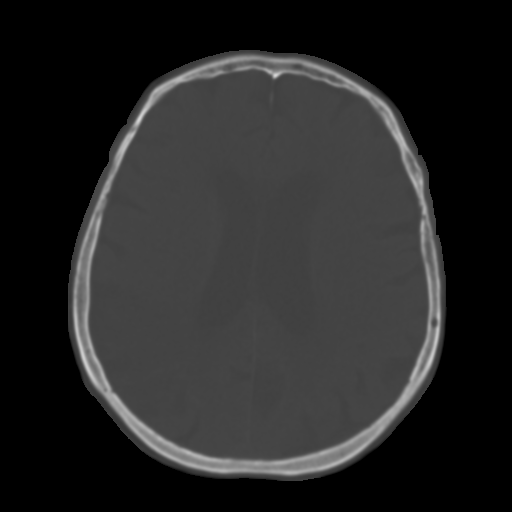
[im 28/37  brain]
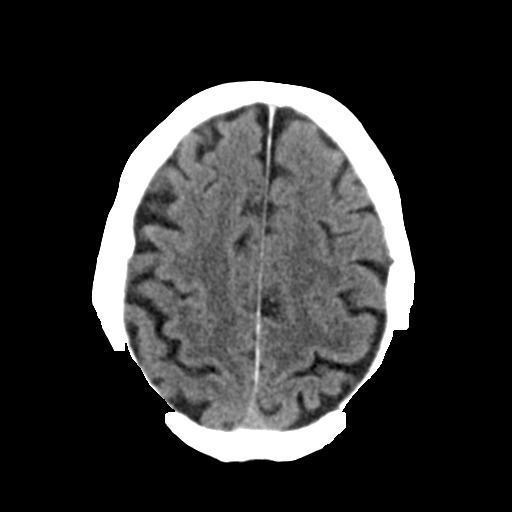
[im 32/37  brain]
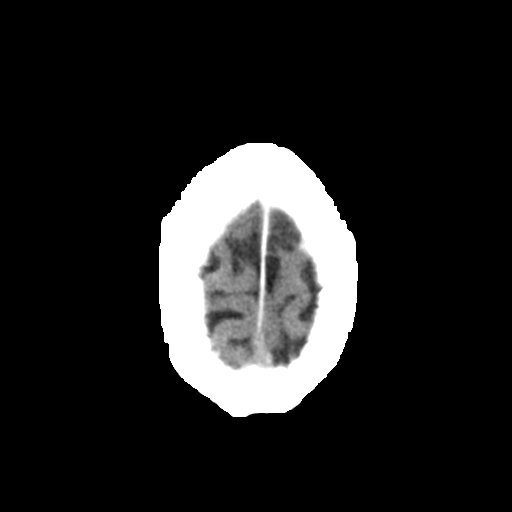

[Series 4: head bone · axial · 0.40mm/px · z∈[-116,-80]mm · 3 of 91 slices shown]
[im 10/91  bone]
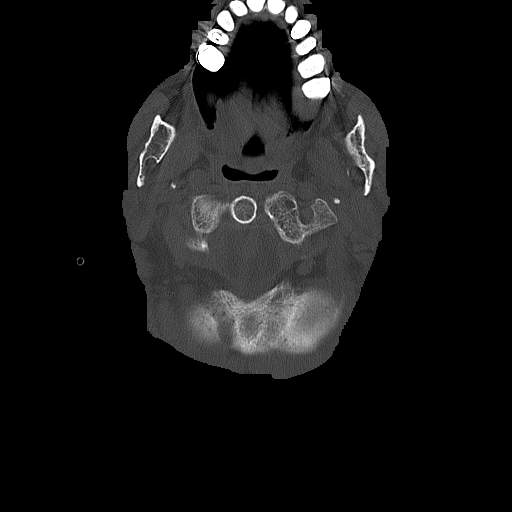
[im 19/91  bone]
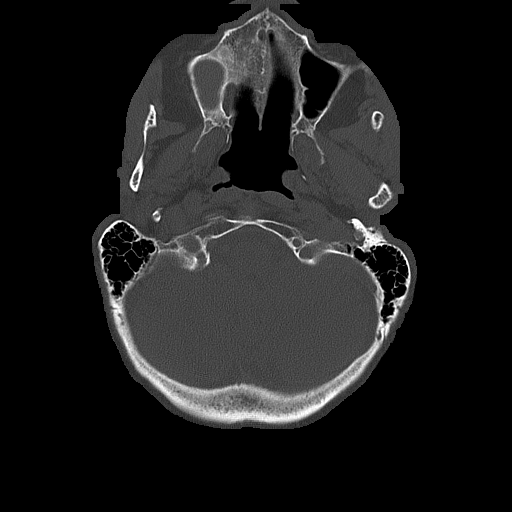
[im 28/91  bone]
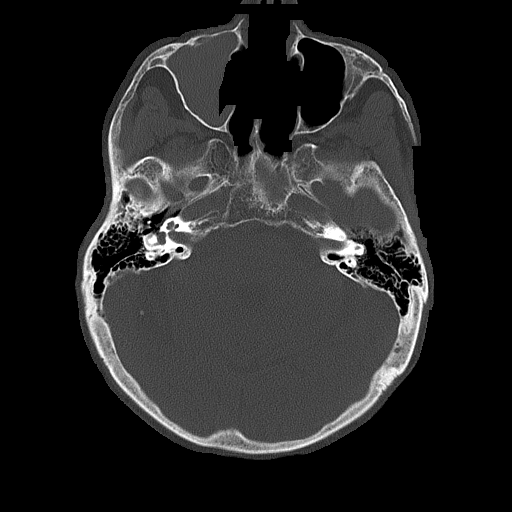

[Series 5: head without cor · coronal · non-contrast · 0.35mm/px · 3 of 67 slices shown]
[im 23/67  brain]
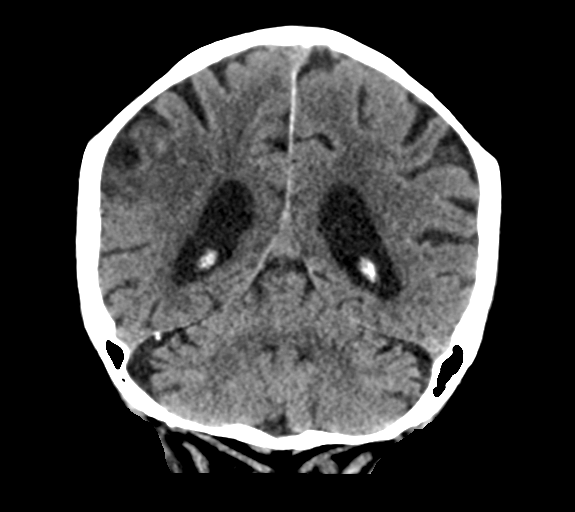
[im 30/67  brain]
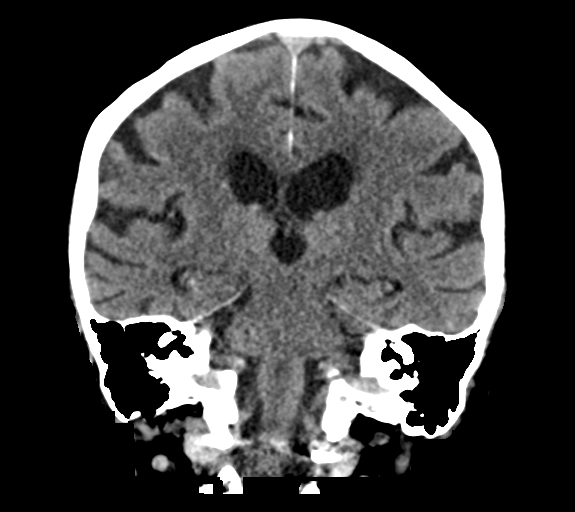
[im 37/67  brain]
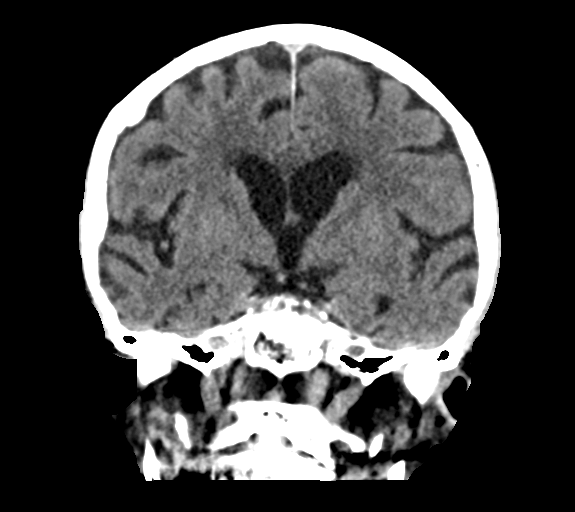

[Series 6: head without sag · sagittal · non-contrast · 0.35mm/px · 3 of 59 slices shown]
[im 20/59  brain]
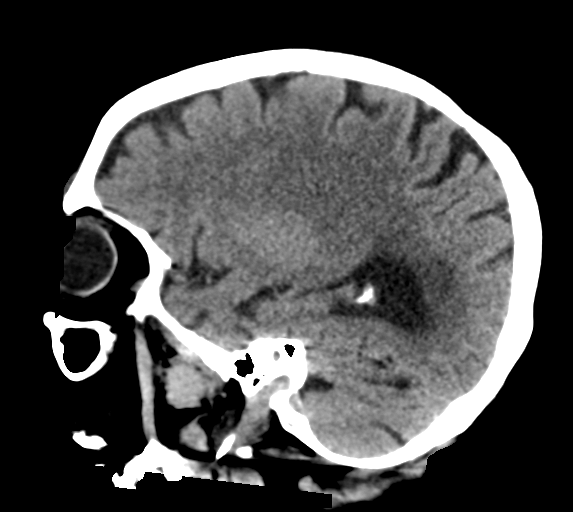
[im 30/59  brain]
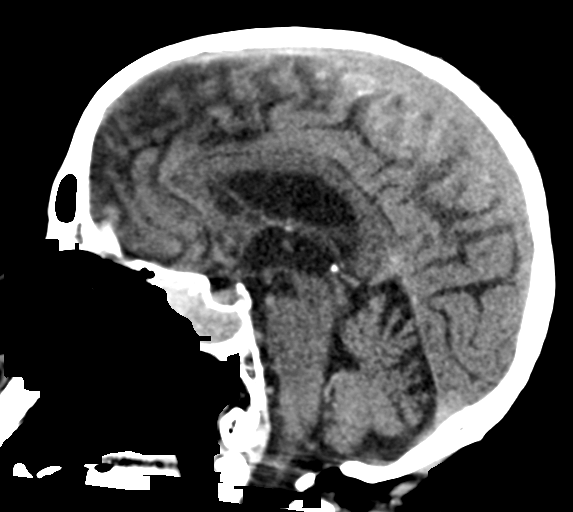
[im 39/59  brain]
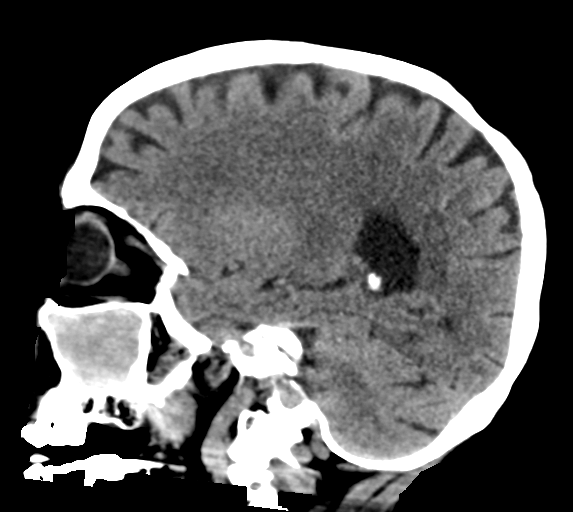

[16 of 47 positions shown; findings below may reference images not displayed]

FINDINGS: Brain: Developing encephalomalacia in the posterior right MCA
territory on series 3, image 23. No malignant hemorrhagic
transformation or mass effect. Questionable petechial hemorrhage on
series 3, image 24.

Elsewhere Stable gray-white matter differentiation throughout the
brain. Patchy white matter hypodensity.

No midline shift, ventriculomegaly, mass effect, evidence of mass
lesion, intracranial hemorrhage or new cortically based acute
infarction.

Vascular: Mild Calcified atherosclerosis at the skull base. No
suspicious intracranial vascular hyperdensity.

Skull: Stable and intact. Osteopenia.

Sinuses/Orbits: Chronic right maxillary and left sphenoid sinus
disease. Trace low-density fluid in the right sphenoid is new. Other
visualized paranasal sinuses and mastoids are stable and well
pneumatized.

Other: No acute orbit or scalp soft tissue finding.
IMPRESSION: 1. No acute intracranial abnormality or acute traumatic injury
identified.
2. Expected evolution of the posterior right MCA territory infarct
since [DATE].

## 2019-04-28 IMAGING — CR LEFT ELBOW - COMPLETE 3+ VIEW
4 series · 4 of 4 positions shown · non-contrast
Comparison: None.

CLINICAL DATA: Post unwitnessed fall at home.

EXAM:
LEFT ELBOW - COMPLETE 3+ VIEW

[elbow ap]
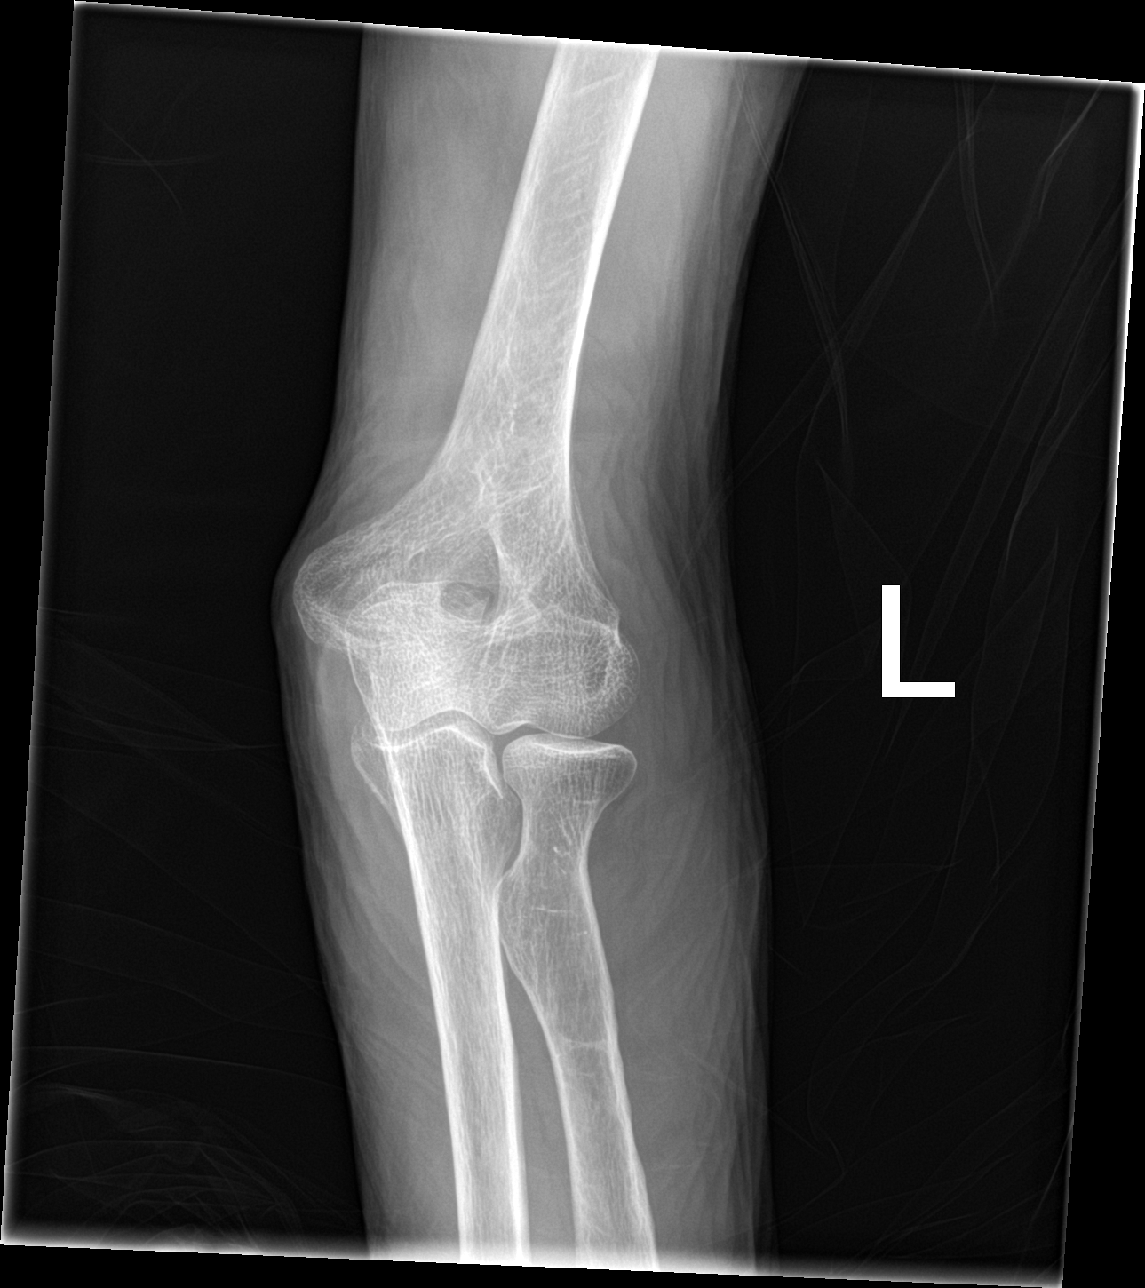

[elbow obl (1 of 2)]
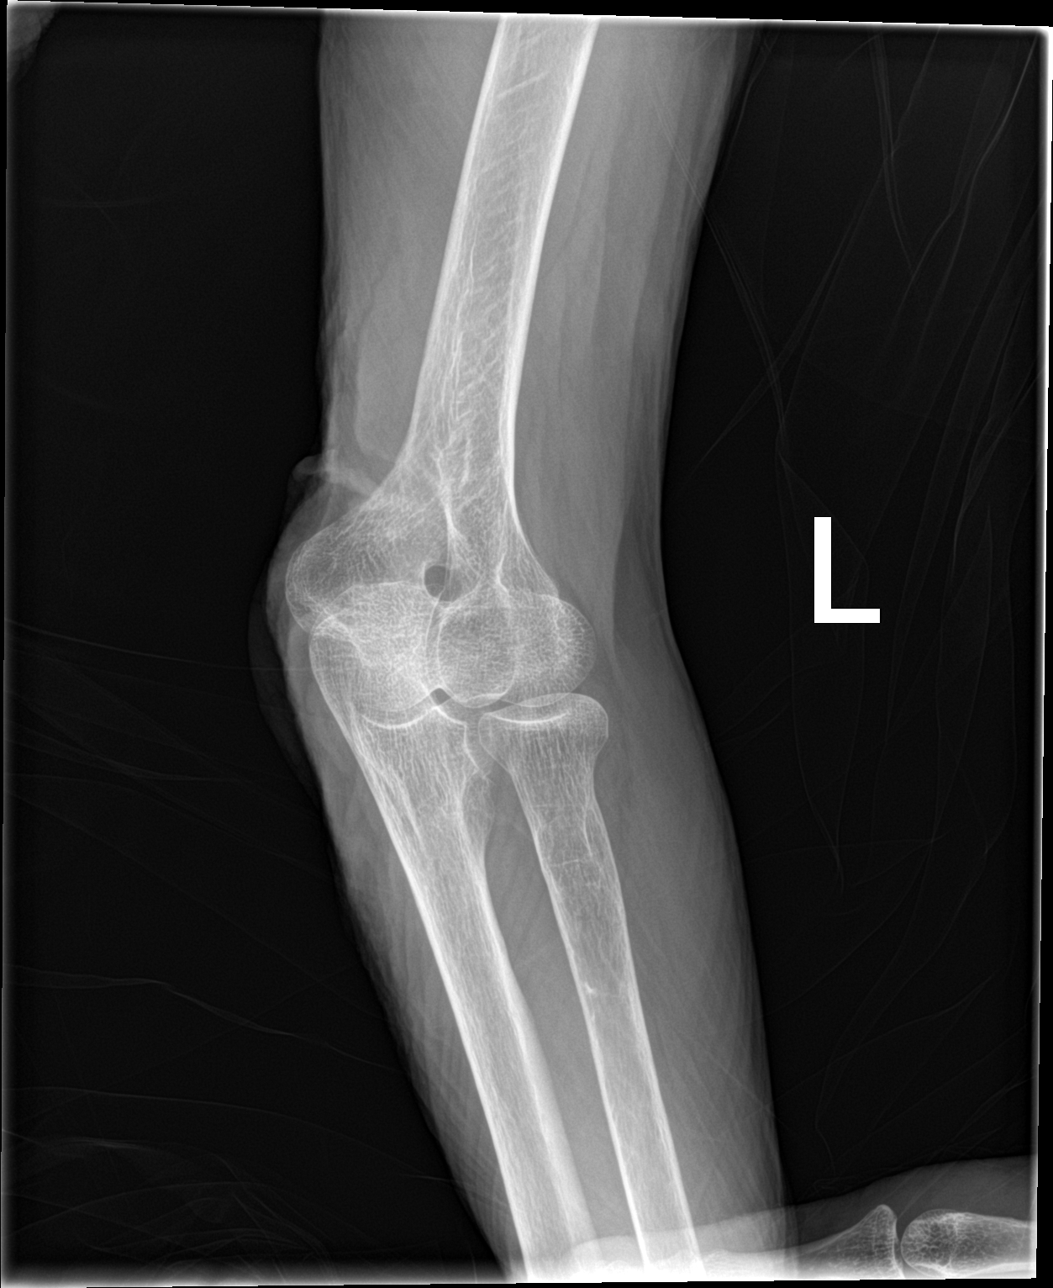

[elbow obl (2 of 2)]
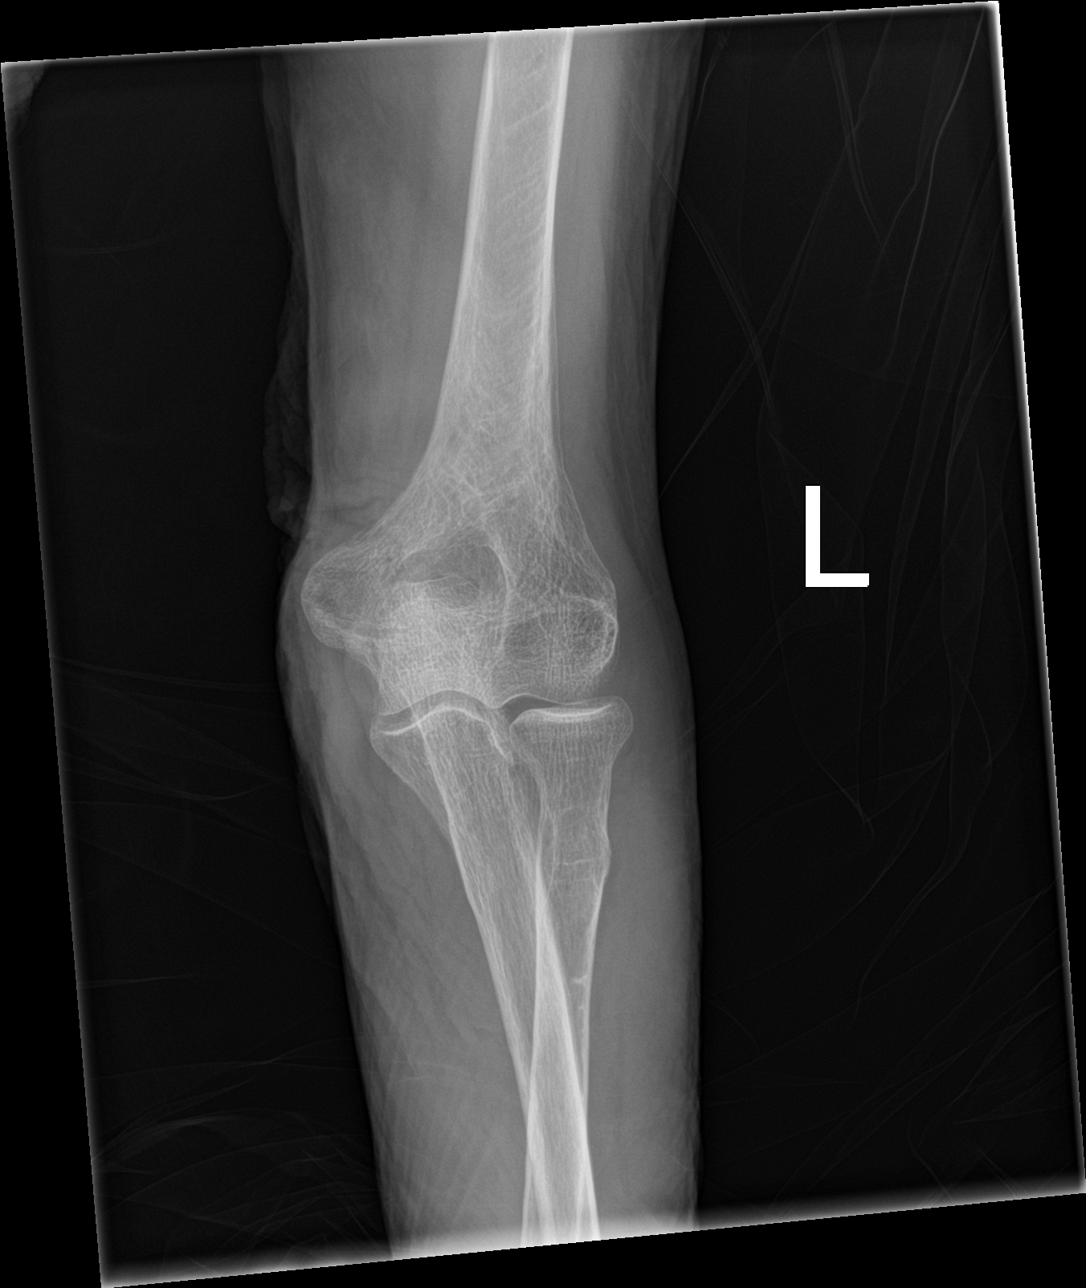

[elbow lat]
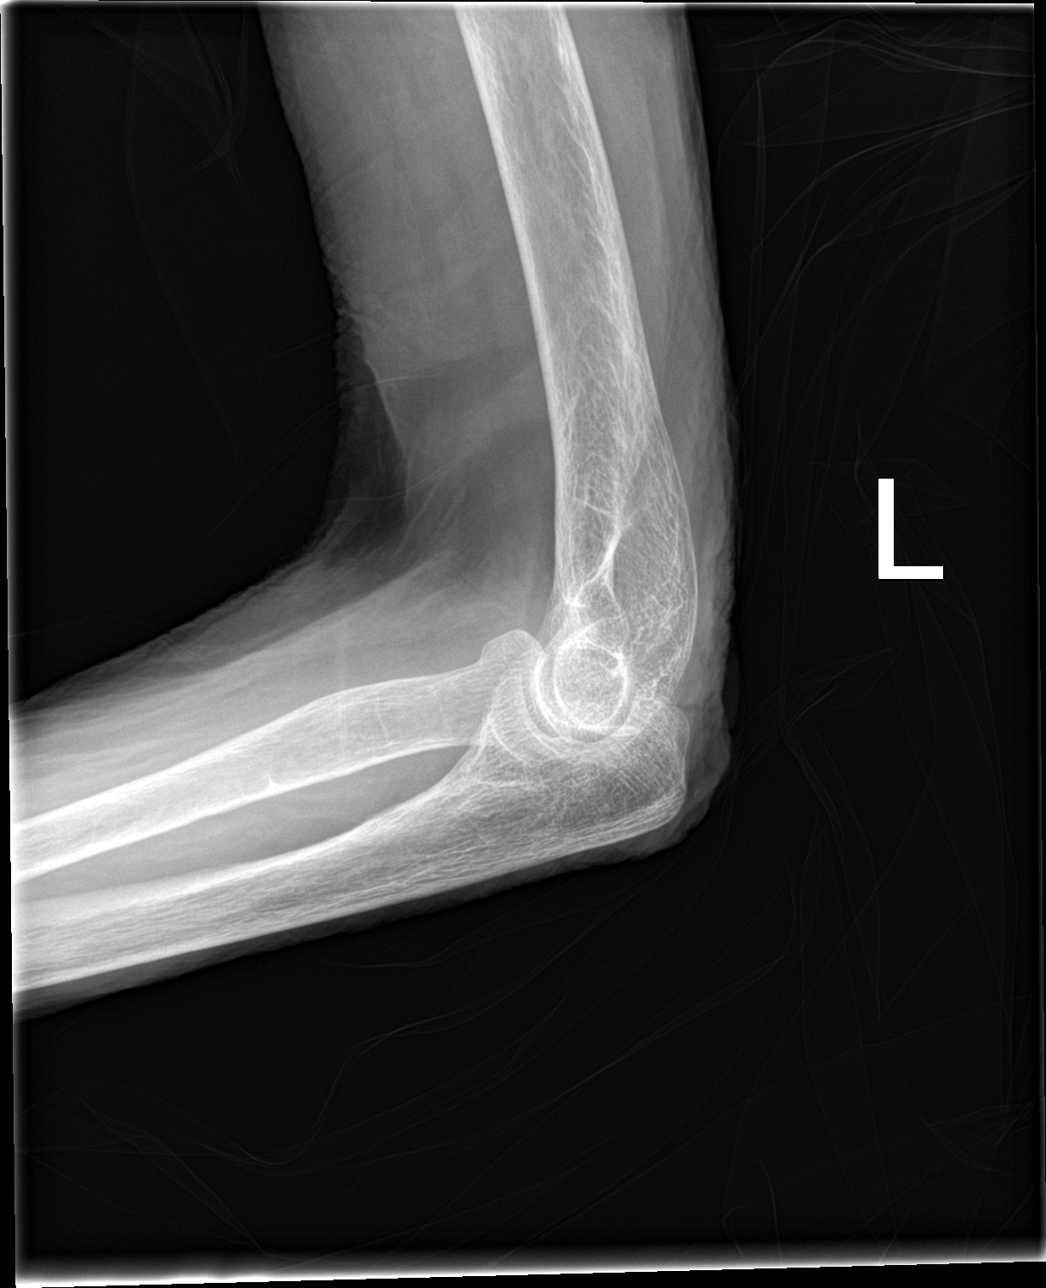

[4 of 4 positions shown; findings below may reference images not displayed]

FINDINGS: The bones are under mineralized. There is no evidence of fracture,
dislocation, or joint effusion. There is no evidence of arthropathy
or other focal bone abnormality. Soft tissues are unremarkable.
IMPRESSION: No fracture or subluxation of the left elbow.

## 2019-04-28 IMAGING — CR LEFT SHOULDER - 2+ VIEW
2 series · 2 of 2 positions shown · non-contrast
Comparison: None.

CLINICAL DATA: Post unwitnessed fall at home.

EXAM:
LEFT SHOULDER - 2+ VIEW

[shoulder grashey]
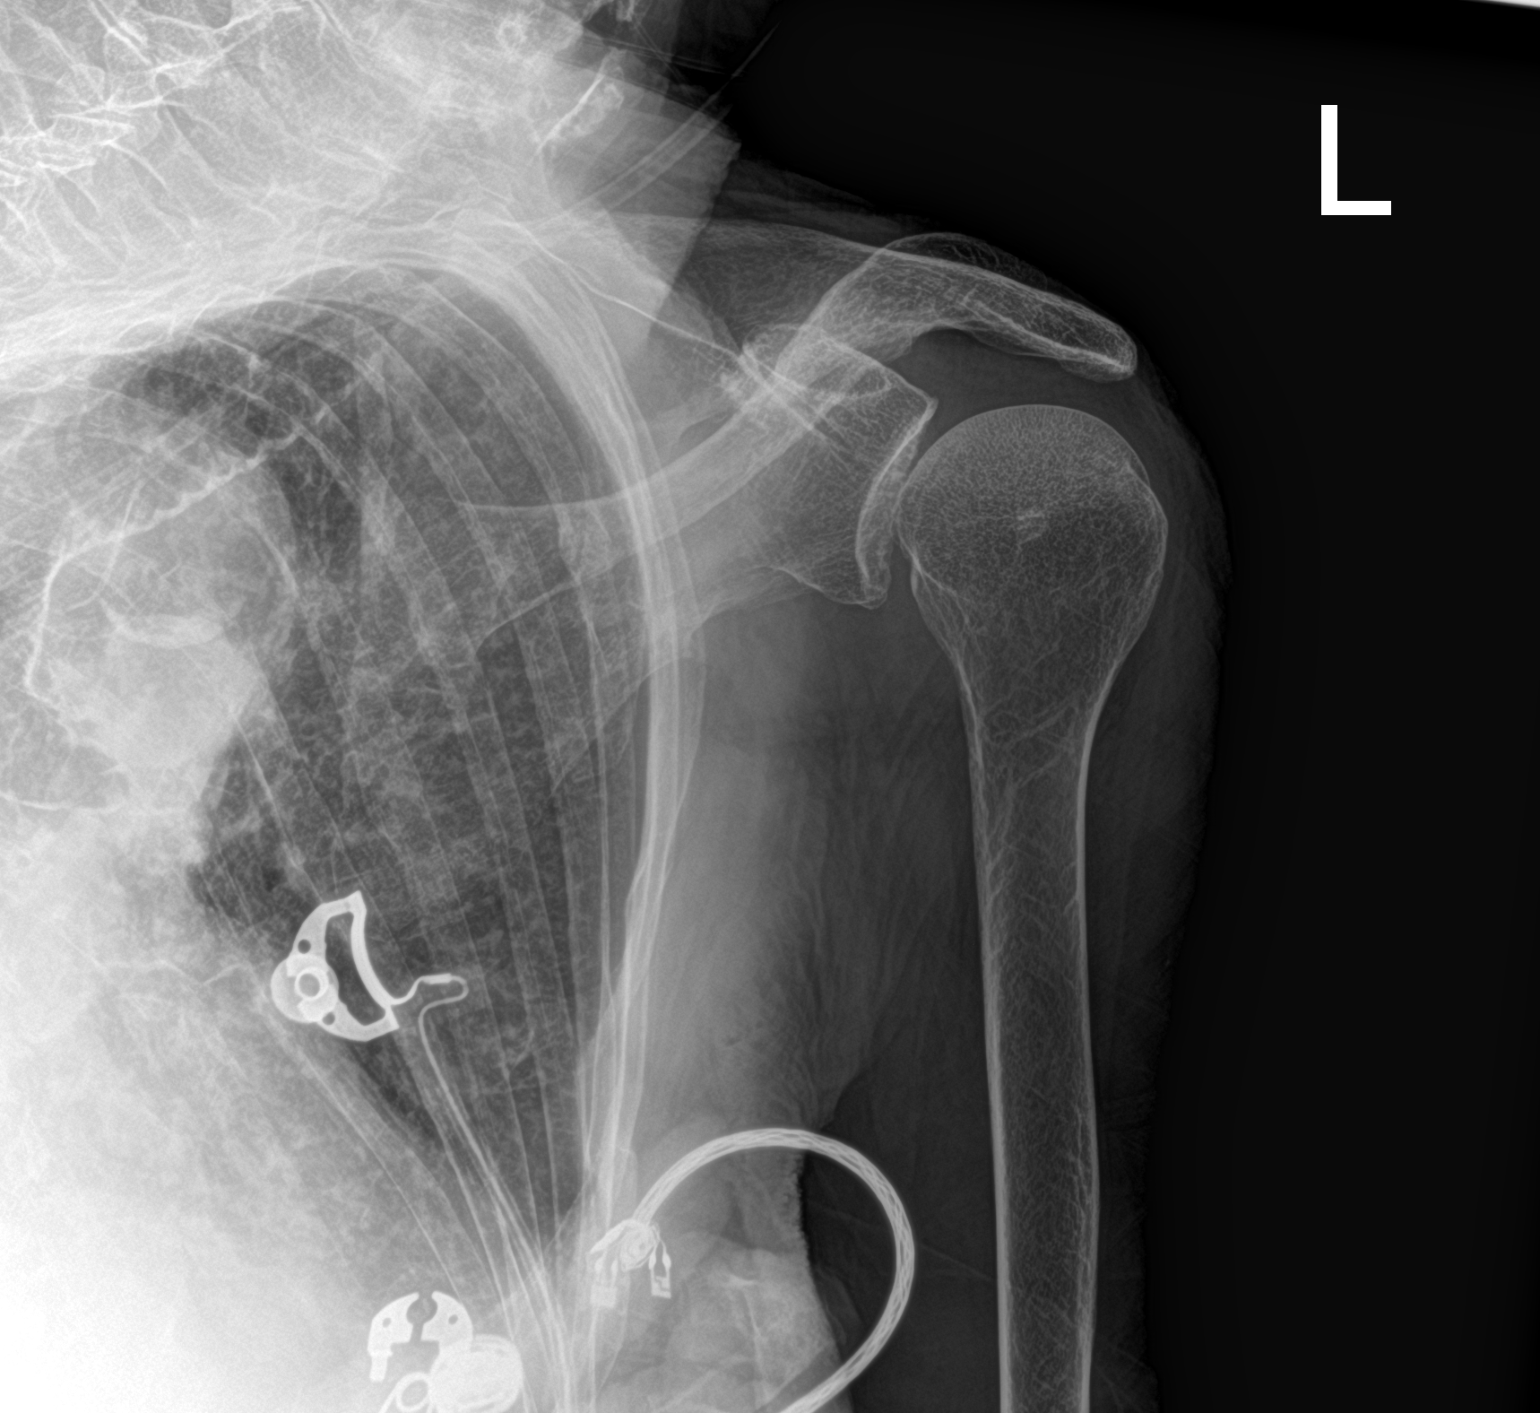

[shoulder y view]
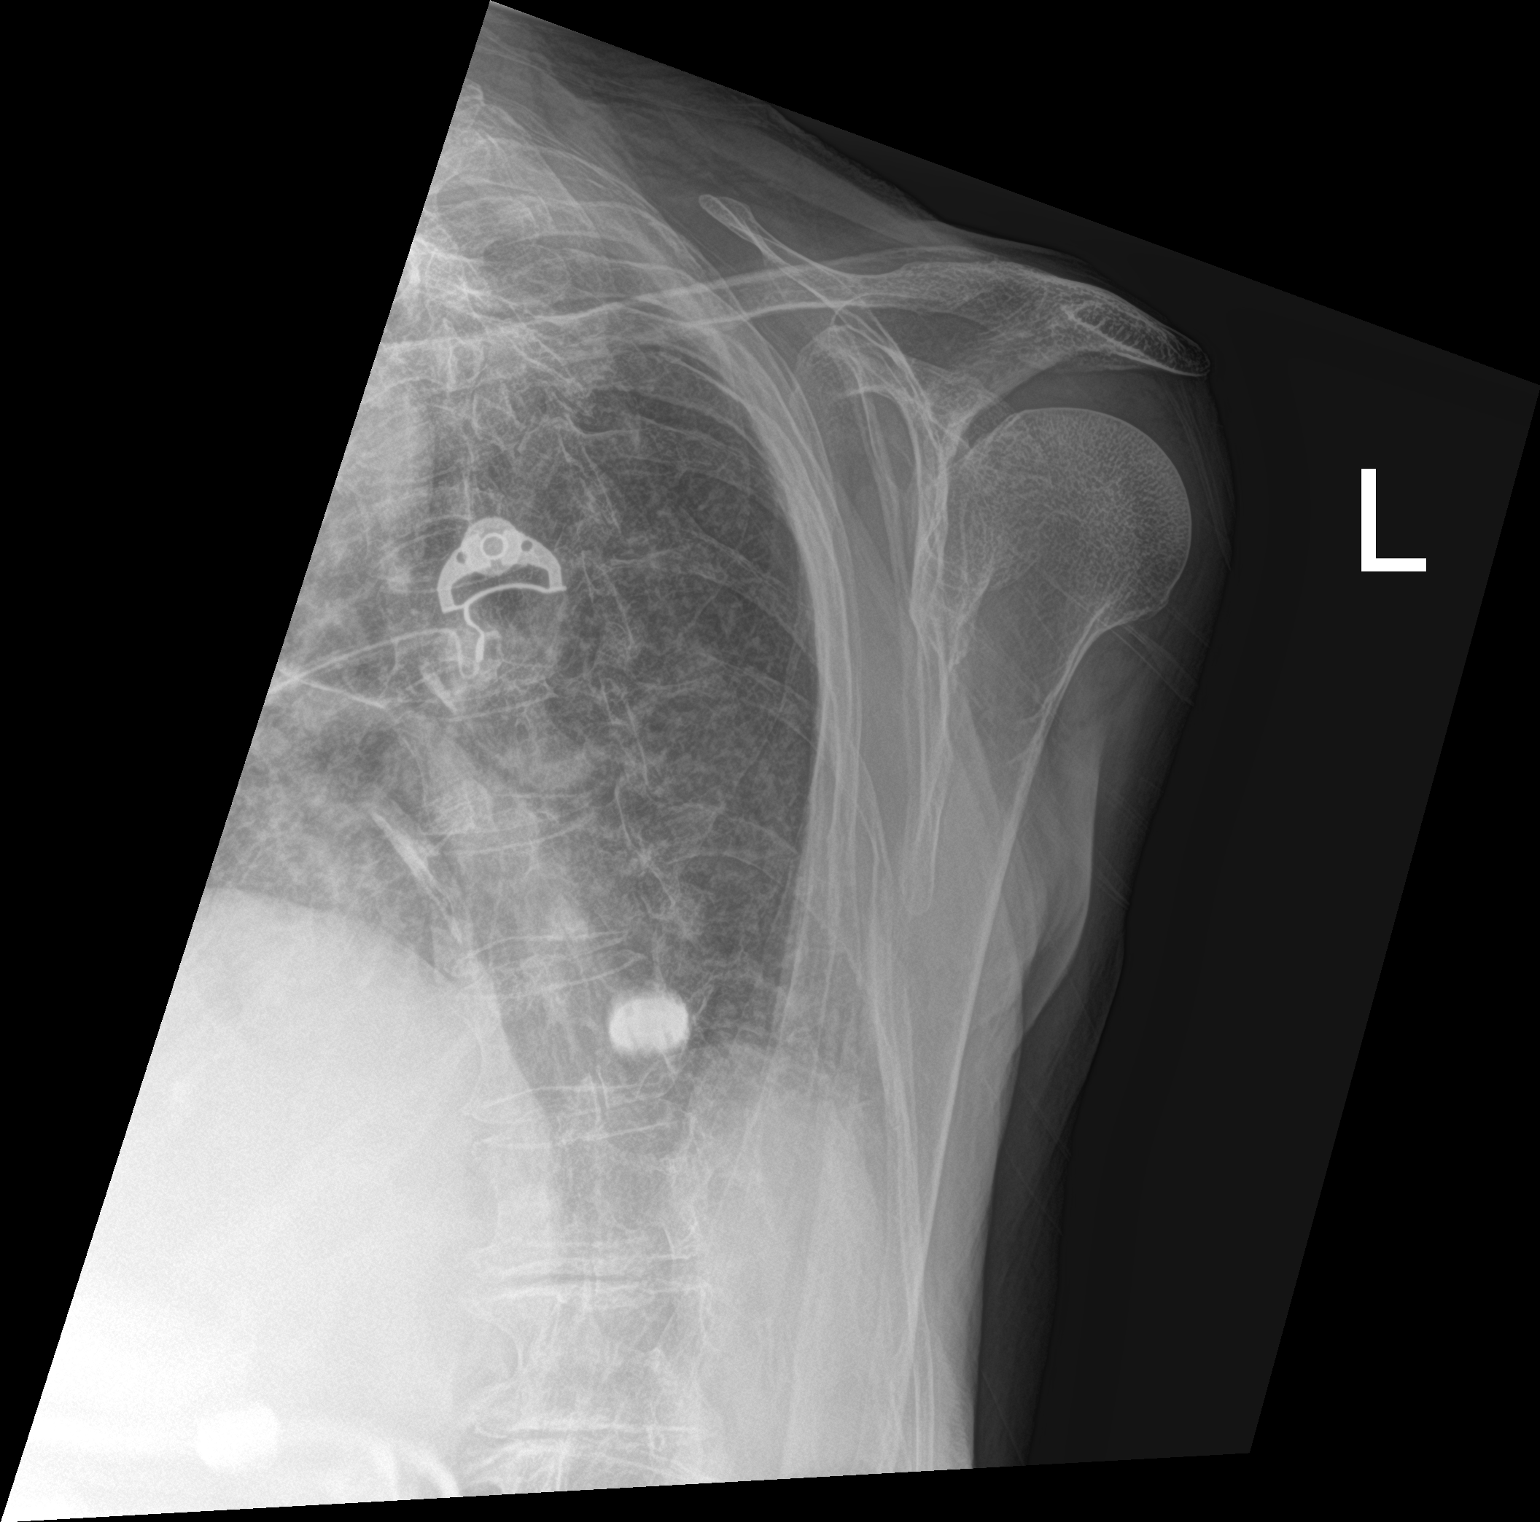

[2 of 2 positions shown; findings below may reference images not displayed]

FINDINGS: The bones are under mineralized. Glenohumeral alignment is not well
assessed due to positioning. No acute fracture. Acromioclavicular
joint is congruent.
IMPRESSION: Glenohumeral alignment is not well assessed due to positioning. No
acute fracture.

## 2019-04-30 NOTE — Progress Notes (Signed)
Carelink Summary Report / Loop Recorder 

## 2019-05-26 ENCOUNTER — Ambulatory Visit (INDEPENDENT_AMBULATORY_CARE_PROVIDER_SITE_OTHER): Payer: Medicare Other | Admitting: *Deleted

## 2019-05-26 DIAGNOSIS — I63511 Cerebral infarction due to unspecified occlusion or stenosis of right middle cerebral artery: Secondary | ICD-10-CM | POA: Diagnosis not present

## 2019-05-26 LAB — CUP PACEART REMOTE DEVICE CHECK
Date Time Interrogation Session: 20200712220957
Implantable Pulse Generator Implant Date: 20200302

## 2019-06-04 NOTE — Progress Notes (Signed)
Carelink Summary Report / Loop Recorder 

## 2019-06-27 ENCOUNTER — Ambulatory Visit (INDEPENDENT_AMBULATORY_CARE_PROVIDER_SITE_OTHER): Payer: Medicare Other | Admitting: *Deleted

## 2019-06-27 DIAGNOSIS — I639 Cerebral infarction, unspecified: Secondary | ICD-10-CM

## 2019-06-29 LAB — CUP PACEART REMOTE DEVICE CHECK
Date Time Interrogation Session: 20200814223540
Implantable Pulse Generator Implant Date: 20200302

## 2019-07-01 ENCOUNTER — Inpatient Hospital Stay: Payer: Medicare Other | Admitting: Adult Health

## 2019-07-04 NOTE — Progress Notes (Signed)
Carelink Summary Report / Loop Recorder 

## 2019-07-21 ENCOUNTER — Emergency Department (HOSPITAL_COMMUNITY)
Admission: EM | Admit: 2019-07-21 | Discharge: 2019-07-21 | Disposition: A | Discharge status: Home / Self Care | Payer: Medicare Other | Attending: Emergency Medicine | Admitting: Emergency Medicine

## 2019-07-21 ENCOUNTER — Emergency Department (HOSPITAL_COMMUNITY): Payer: Medicare Other

## 2019-07-21 ENCOUNTER — Encounter (HOSPITAL_COMMUNITY): Payer: Self-pay | Admitting: Emergency Medicine

## 2019-07-21 DIAGNOSIS — Y939 Activity, unspecified: Secondary | ICD-10-CM | POA: Diagnosis not present

## 2019-07-21 DIAGNOSIS — Y999 Unspecified external cause status: Secondary | ICD-10-CM | POA: Diagnosis not present

## 2019-07-21 DIAGNOSIS — S098XXA Other specified injuries of head, initial encounter: Secondary | ICD-10-CM | POA: Insufficient documentation

## 2019-07-21 DIAGNOSIS — Z7901 Long term (current) use of anticoagulants: Secondary | ICD-10-CM | POA: Insufficient documentation

## 2019-07-21 DIAGNOSIS — I10 Essential (primary) hypertension: Secondary | ICD-10-CM | POA: Insufficient documentation

## 2019-07-21 DIAGNOSIS — W01198A Fall on same level from slipping, tripping and stumbling with subsequent striking against other object, initial encounter: Secondary | ICD-10-CM | POA: Insufficient documentation

## 2019-07-21 DIAGNOSIS — Z79899 Other long term (current) drug therapy: Secondary | ICD-10-CM | POA: Insufficient documentation

## 2019-07-21 DIAGNOSIS — S0083XA Contusion of other part of head, initial encounter: Secondary | ICD-10-CM | POA: Diagnosis not present

## 2019-07-21 DIAGNOSIS — Y92129 Unspecified place in nursing home as the place of occurrence of the external cause: Secondary | ICD-10-CM | POA: Insufficient documentation

## 2019-07-21 DIAGNOSIS — S0990XA Unspecified injury of head, initial encounter: Secondary | ICD-10-CM

## 2019-07-21 DIAGNOSIS — W19XXXA Unspecified fall, initial encounter: Secondary | ICD-10-CM

## 2019-07-21 MED ORDER — ACETAMINOPHEN 325 MG PO TABS
650.0000 mg | ORAL_TABLET | Freq: Once | ORAL | Status: AC
  Administered 2019-07-21: 10:00:00 650 mg via ORAL
  Filled 2019-07-21: qty 2

## 2019-07-21 NOTE — ED Notes (Signed)
RN spoke with patients daughter. Daughter will be picking patient up and taking her back to nursing facility. discharge instructions reviewed with patient and family.

## 2019-07-21 NOTE — Discharge Instructions (Addendum)
You have been diagnosed today with fall, head injury, hematoma of the face.  At this time there does not appear to be the presence of an emergent medical condition, however there is always the potential for conditions to change. Please read and follow the below instructions.  Please return to the Emergency Department immediately for any new or worsening symptoms. Please be sure to follow up with your Primary Care Provider within one week regarding your visit today; please call their office to schedule an appointment even if you are feeling better for a follow-up visit. Please be sure to use your walker and to ask for assistance from nursing staff to avoid future falls. Please be sure to discuss your blood thinning medication with your primary care provider at your next visit; as you have been having frequent falls they may want to change your medications.  Get help right away if: You have: A very bad headache that is not helped by medicine. Trouble walking or weakness in your arms and legs. Clear or bloody fluid coming from your nose or ears. Changes in how you see (vision). Shaking movements that you cannot control. You lose your balance. You vomit. The black centers of your eyes (pupils) change in size. Your speech is slurred. Your dizziness gets worse. You pass out. You are sleepier than normal and have trouble staying awake. Your symptoms get worse. You have any new/concerning or worsening symptoms  Please read the additional information packets attached to your discharge summary.

## 2019-07-21 NOTE — ED Triage Notes (Signed)
Pt states she got up this morning to use the restroom with her walker, pt reports having a difficult time getting the bathroom door open and ended up falling to her left sided and hitting the left side of her head just above her eye. Pt denies LOC and has no complaints other than a hematoma above left eye. Pt does take eliqus.   Pt lives at [place] and states her daughter has been called.

## 2019-07-21 NOTE — ED Provider Notes (Signed)
[facility] EMERGENCY DEPARTMENT Provider Note   CSN: [id number] Arrival date & time: 07/21/19  0750     History   Chief Complaint Chief Complaint  Patient presents with   Fall    HPI Madison Odom is a 85 y.o. female with history of CVA, MVP, hypertension, GERD, on Eliquis presents from nursing home after a fall.  Patient reports that she lost her balance falling from a standing position towards her left side.  She reports that she struck her left forehead on the ground.  She denies any loss of consciousness.  EMS was called shortly thereafter.  On my evaluation patient reports that she is feeling well and has no pain.  She has a large hematoma above her left eyebrow denies any other injury today.  Patient has loss consciousness, headache, vision changes, neck pain, numbness/tingling, weakness, back pain, chest pain, abdominal pain, hip pain, head injury of the extremities or any additional concerns today.  She denies any pain at this time and does not want any medications.     HPI  Past Medical History:  Diagnosis Date   Anemia    GERD (gastroesophageal reflux disease)    Hyperlipidemia    Hypertension    Hypertensive retinopathy    MVP (mitral valve prolapse)    OP (osteoporosis)    Pelvis fracture ([place]) 09/2015   CLOSED    Scoliosis    Spastic colon    Stroke [facility])    left side neglect    Patient Active Problem List   Diagnosis Date Noted   Stroke (cerebrum) ([place]) 02/03/2019   Fall 88/41/6606   Acute metabolic encephalopathy 30/16/0109   Leukocytosis 02/03/2019   Hypotension 02/03/2019   Iron deficiency anemia 01/15/2019   Acute ischemic right MCA stroke ([place]) 01/14/2019   Acute CVA (cerebrovascular accident) ([place]) 01/09/2019   Epiretinal membrane, left eye 04/18/2017   Acute blood loss anemia 09/27/2015   Pelvic fracture ([place]) 09/21/2015   Closed fracture of pelvis ([place])    Hypertensive retinopathy of both eyes  08/16/2015   Hypertension 01/28/2014   Kyphoscoliosis 09/16/2013   After cataract 02/19/2013   Pseudophakia of both eyes 01/22/2013   Posterior capsular opacification 10/29/2012   Cystoid macular edema 04/30/2012   Epiretinal membrane 04/30/2012   MVP (mitral valve prolapse) 05/24/2011   Hypercholesterolemia 05/24/2011    Past Surgical History:  Procedure Laterality Date   [place] INSERTION Left 01/13/2019   Procedure: LOOP RECORDER INSERTION;  Surgeon: Evans Lance, MD;  Location: [place];  Service: Cardiovascular;  Laterality: Left;   ROTATOR CUFF REPAIR Right    TEE WITHOUT CARDIOVERSION N/A 01/13/2019   Procedure: TRANSESOPHAGEAL ECHOCARDIOGRAM (TEE);  Surgeon: Sanda Klein, MD;  Location: [facility] ENDOSCOPY;  Service: Cardiovascular;  Laterality: N/A;  loop     OB History   No obstetric history on file.      Home Medications    Prior to Admission medications   Medication Sig Start Date End Date Taking? Authorizing Provider  acetaminophen (TYLENOL) 325 MG tablet Take 1-2 tablets (325-650 mg total) by mouth every 4 (four) hours as needed for mild pain. 01/17/19   Love, Ivan Anchors, PA-C  apixaban (ELIQUIS) 2.5 MG TABS tablet Take 2.5 mg by mouth 2 (two) times daily.    [provider]  Calcium Carb-Cholecalciferol (CALCIUM 500 +D PO)  Take 1 tablet by mouth daily.     [provider]  ferrous sulfate 325 (65 FE) MG tablet Take 1 tablet (325 mg total) by mouth daily at 12 noon. 01/17/19   Love, Evlyn KannerPamela S, PA-C  loperamide (IMODIUM) 2 MG capsule Take 1 capsule by mouth as needed. 03/21/19   [provider]  Multiple Vitamin (MULTIVITAMIN) tablet Take 1 tablet by mouth daily.    [provider]  nadolol (CORGARD) 20 MG tablet Take 1 tablet (20 mg total) by mouth daily. 02/04/19   Zannie CoveJoseph, Preetha, MD  pantoprazole (PROTONIX) 40 MG  tablet TAKE 1 TABLET BY MOUTH EVERY DAY 02/10/19   Kirsteins, Victorino SparrowAndrew E, MD  simvastatin (ZOCOR) 40 MG tablet TAKE 1 TABLET BY MOUTH EVERY DAY AT 6 PM. 02/10/19   Kirsteins, Victorino SparrowAndrew E, MD  traZODone (DESYREL) 50 MG tablet Take 50 mg by mouth at bedtime. 03/19/19   [provider]    Family History Family History  Problem Relation Age of Onset   Heart attack Mother    Hypertension Mother    Heart disease Father    Hypertension Father    Stroke Neg Hx     Social History Social History   Tobacco Use   Smoking status: Never Smoker   Smokeless tobacco: Never Used  Substance Use Topics   Alcohol use: No   Drug use: No     Allergies   Phenothiazines, Prochlorperazine, Benadryl [diphenhydramine], Ferrous sulfate, Penicillins, and Tetracycline   Review of Systems Review of Systems Ten systems are reviewed and are negative for acute change except as noted in the HPI  Physical Exam Updated Vital Signs BP 137/84    Pulse 87    Temp 97.8 F (36.6 C) (Oral)    Resp 16    SpO2 95%   Physical Exam Constitutional:      General: She is not in acute distress.    Appearance: Normal appearance. She is well-developed. She is not ill-appearing or diaphoretic.  HENT:     Head: Normocephalic. No raccoon eyes or Battle's sign.     Jaw: There is normal jaw occlusion. No trismus.      Comments: Large 4 cm hematoma above the left eyebrow.    Right Ear: External ear normal.     Left Ear: External ear normal.     Nose: Nose normal. No rhinorrhea.     Right Nostril: No epistaxis.     Left Nostril: No epistaxis.     Mouth/Throat:     Mouth: Mucous membranes are moist.     Pharynx: Oropharynx is clear.  Eyes:     General: Vision grossly intact. Gaze aligned appropriately.     Extraocular Movements: Extraocular movements intact.     Pupils: Pupils are equal, round, and reactive to light.  Neck:     Musculoskeletal: Normal range of motion. No spinous process tenderness or  muscular tenderness.     Trachea: Trachea and phonation normal. No tracheal deviation.  Pulmonary:     Effort: Pulmonary effort is normal. No respiratory distress.     Breath sounds: Normal breath sounds and air entry.  Chest:     Chest wall: No deformity, tenderness or crepitus.  Abdominal:     General: There is no distension.     Palpations: Abdomen is soft.     Tenderness: There is no abdominal tenderness. There is no guarding or rebound.  Musculoskeletal: Normal range of motion.     Comments: Kyphosis - No  midline C/T/L spinal tenderness to palpation, no paraspinal muscle tenderness, no acute deformity, crepitus, or step-off noted. No sign of injury to the neck or back. - Hips stable to compression bilaterally without pain.  Patient able to actively bring bilateral knees to chest without pain.  Feet:     Right foot:     Protective Sensation: 3 sites tested. 3 sites sensed.     Left foot:     Protective Sensation: 3 sites tested. 3 sites sensed.  Skin:    General: Skin is warm and dry.  Neurological:     Mental Status: She is alert.     GCS: GCS eye subscore is 4. GCS verbal subscore is 5. GCS motor subscore is 6.     Comments: Speech is clear and goal oriented, follows commands Major Cranial nerves without deficit, no facial droop Moves extremities without ataxia, coordination intact  Psychiatric:        Behavior: Behavior normal.      ED Treatments / Results  Labs (all labs ordered are listed, but only abnormal results are displayed) Labs Reviewed - No data to display  EKG None  Radiology Dg Pelvis 1-2 Views  Result Date: 07/21/2019 CLINICAL DATA:  Recent fall.  History of pelvic fracture. EXAM: PELVIS - 1-2 VIEW COMPARISON:  Recent hip CT 09/21/2015 FINDINGS: Old left pubic rami fractures. No gross abnormality to either hip. Right pubic rami are intact. Gas and stool in the pelvis. IMPRESSION: 1. No acute bone abnormality to the pelvis. 2. Old left pubic rami  fractures. Electronically Signed   By: Richarda Overlie M.D.   On: 07/21/2019 09:05   Ct Head Wo Contrast  Result Date: 07/21/2019 CLINICAL DATA:  Fall, facial trauma, pain EXAM: CT HEAD WITHOUT CONTRAST CT MAXILLOFACIAL WITHOUT CONTRAST CT CERVICAL SPINE WITHOUT CONTRAST TECHNIQUE: Multidetector CT imaging of the head, cervical spine, and maxillofacial structures were performed using the standard protocol without intravenous contrast. Multiplanar CT image reconstructions of the cervical spine and maxillofacial structures were also generated. COMPARISON:  02/02/2019 FINDINGS: CT HEAD FINDINGS Brain: Continuing evolution of posterior right MCA distribution infarct with encephalomalacia. No hemorrhagic transformation. No evidence of acute infarct. No intracranial hemorrhage, extra-axial collection, or mass effect. Scattered low-density changes within the periventricular and subcortical white matter compatible with chronic microvascular ischemic change. Mild diffuse cerebral volume loss. Vascular: Mild atherosclerotic calcifications involving the large vessels of the skull base. No unexpected hyperdense vessel. Skull: Negative for fracture or bone lesion. Other: Focal superficial hematoma overlying the left supraorbital frontal bone measuring 2.7 x 1.3 cm. CT MAXILLOFACIAL FINDINGS Osseous: No fracture or mandibular dislocation. No destructive process. Orbits: Negative. No traumatic or inflammatory finding. Sinuses: Chronic opacification of the left sphenoid sinus and right maxillary sinus. Mastoid air cells are clear. Soft tissues: Left supraorbital soft tissue hematoma. CT CERVICAL SPINE FINDINGS Alignment: Cervical levocurvature. Grade 1 anterolisthesis C4 on C5. Skull base and vertebrae: No acute fracture. No primary bone lesion or focal pathologic process. Soft tissues and spinal canal: No prevertebral fluid or swelling. No visible canal hematoma. Disc levels: Multilevel degenerative changes of the cervical spine  most pronounced at the C5-6 level. No high-grade canal stenosis is identified. Upper chest: Left apical pleuroparenchymal scarring. Incidental note of small bilateral thyroid lobe nodules. Other: None. IMPRESSION: 1. No acute intracranial findings. 2. Focal superficial hematoma overlying the supraorbital left frontal bone without calvarial or facial bone fracture. 3. No acute fracture or dislocation of the cervical spine. 4. Continued evolution of posterior  right MCA territory infarct. 5. Chronic right maxillary and left sphenoid sinusitis. Electronically Signed   By: Duanne GuessNicholas  Plundo M.D.   On: 07/21/2019 09:40   Ct Cervical Spine Wo Contrast  Result Date: 07/21/2019 CLINICAL DATA:  Fall, facial trauma, pain EXAM: CT HEAD WITHOUT CONTRAST CT MAXILLOFACIAL WITHOUT CONTRAST CT CERVICAL SPINE WITHOUT CONTRAST TECHNIQUE: Multidetector CT imaging of the head, cervical spine, and maxillofacial structures were performed using the standard protocol without intravenous contrast. Multiplanar CT image reconstructions of the cervical spine and maxillofacial structures were also generated. COMPARISON:  02/02/2019 FINDINGS: CT HEAD FINDINGS Brain: Continuing evolution of posterior right MCA distribution infarct with encephalomalacia. No hemorrhagic transformation. No evidence of acute infarct. No intracranial hemorrhage, extra-axial collection, or mass effect. Scattered low-density changes within the periventricular and subcortical white matter compatible with chronic microvascular ischemic change. Mild diffuse cerebral volume loss. Vascular: Mild atherosclerotic calcifications involving the large vessels of the skull base. No unexpected hyperdense vessel. Skull: Negative for fracture or bone lesion. Other: Focal superficial hematoma overlying the left supraorbital frontal bone measuring 2.7 x 1.3 cm. CT MAXILLOFACIAL FINDINGS Osseous: No fracture or mandibular dislocation. No destructive process. Orbits: Negative. No  traumatic or inflammatory finding. Sinuses: Chronic opacification of the left sphenoid sinus and right maxillary sinus. Mastoid air cells are clear. Soft tissues: Left supraorbital soft tissue hematoma. CT CERVICAL SPINE FINDINGS Alignment: Cervical levocurvature. Grade 1 anterolisthesis C4 on C5. Skull base and vertebrae: No acute fracture. No primary bone lesion or focal pathologic process. Soft tissues and spinal canal: No prevertebral fluid or swelling. No visible canal hematoma. Disc levels: Multilevel degenerative changes of the cervical spine most pronounced at the C5-6 level. No high-grade canal stenosis is identified. Upper chest: Left apical pleuroparenchymal scarring. Incidental note of small bilateral thyroid lobe nodules. Other: None. IMPRESSION: 1. No acute intracranial findings. 2. Focal superficial hematoma overlying the supraorbital left frontal bone without calvarial or facial bone fracture. 3. No acute fracture or dislocation of the cervical spine. 4. Continued evolution of posterior right MCA territory infarct. 5. Chronic right maxillary and left sphenoid sinusitis. Electronically Signed   By: Duanne GuessNicholas  Plundo M.D.   On: 07/21/2019 09:40   Ct Maxillofacial Wo Contrast  Result Date: 07/21/2019 CLINICAL DATA:  Fall, facial trauma, pain EXAM: CT HEAD WITHOUT CONTRAST CT MAXILLOFACIAL WITHOUT CONTRAST CT CERVICAL SPINE WITHOUT CONTRAST TECHNIQUE: Multidetector CT imaging of the head, cervical spine, and maxillofacial structures were performed using the standard protocol without intravenous contrast. Multiplanar CT image reconstructions of the cervical spine and maxillofacial structures were also generated. COMPARISON:  02/02/2019 FINDINGS: CT HEAD FINDINGS Brain: Continuing evolution of posterior right MCA distribution infarct with encephalomalacia. No hemorrhagic transformation. No evidence of acute infarct. No intracranial hemorrhage, extra-axial collection, or mass effect. Scattered low-density  changes within the periventricular and subcortical white matter compatible with chronic microvascular ischemic change. Mild diffuse cerebral volume loss. Vascular: Mild atherosclerotic calcifications involving the large vessels of the skull base. No unexpected hyperdense vessel. Skull: Negative for fracture or bone lesion. Other: Focal superficial hematoma overlying the left supraorbital frontal bone measuring 2.7 x 1.3 cm. CT MAXILLOFACIAL FINDINGS Osseous: No fracture or mandibular dislocation. No destructive process. Orbits: Negative. No traumatic or inflammatory finding. Sinuses: Chronic opacification of the left sphenoid sinus and right maxillary sinus. Mastoid air cells are clear. Soft tissues: Left supraorbital soft tissue hematoma. CT CERVICAL SPINE FINDINGS Alignment: Cervical levocurvature. Grade 1 anterolisthesis C4 on C5. Skull base and vertebrae: No acute fracture. No primary bone lesion or  focal pathologic process. Soft tissues and spinal canal: No prevertebral fluid or swelling. No visible canal hematoma. Disc levels: Multilevel degenerative changes of the cervical spine most pronounced at the C5-6 level. No high-grade canal stenosis is identified. Upper chest: Left apical pleuroparenchymal scarring. Incidental note of small bilateral thyroid lobe nodules. Other: None. IMPRESSION: 1. No acute intracranial findings. 2. Focal superficial hematoma overlying the supraorbital left frontal bone without calvarial or facial bone fracture. 3. No acute fracture or dislocation of the cervical spine. 4. Continued evolution of posterior right MCA territory infarct. 5. Chronic right maxillary and left sphenoid sinusitis. Electronically Signed   By: Duanne Guess M.D.   On: 07/21/2019 09:40    Procedures Procedures (including critical care time)  Medications Ordered in ED Medications  acetaminophen (TYLENOL) tablet 650 mg (650 mg Oral Given 07/21/19 0940)     Initial Impression / Assessment and Plan /  ED Course  I have reviewed the triage vital signs and the nursing notes.  Pertinent labs & imaging results that were available during my care of the patient were reviewed by me and considered in my medical decision making (see chart for details).    DG pelvis:  IMPRESSION:  1. No acute bone abnormality to the pelvis.  2. Old left pubic rami fractures.   CT head/MaxFace,Cspine:    IMPRESSION:  1. No acute intracranial findings.  2. Focal superficial hematoma overlying the supraorbital left  frontal bone without calvarial or facial bone fracture.  3. No acute fracture or dislocation of the cervical spine.  4. Continued evolution of posterior right MCA territory infarct.  5. Chronic right maxillary and left sphenoid sinusitis.  - C-collar removed, on reassessment patient reports that she is feeling well no acute distress.  She is ambulated with nursing staff without pain or difficulty.  Patient appears safe for discharge back to skilled care facility.  At this time there does not appear to be any evidence of an acute emergency medical condition and the patient appears stable for discharge with appropriate outpatient follow up. Diagnosis was discussed with patient who verbalizes understanding of care plan and is agreeable to discharge. I have discussed return precautions with patient who verbalizes understanding of return precautions. Patient encouraged to follow-up with their PCP. All questions answered.  Patient seen and evaluated by Dr. Anitra Lauth during this visit who agrees with care plan and discharge back to facility.  Note: Portions of this report may have been transcribed using voice recognition software. Every effort was made to ensure accuracy; however, inadvertent computerized transcription errors may still be present.  Final Clinical Impressions(s) / ED Diagnoses   Final diagnoses:  Fall, initial encounter  Injury of head, initial encounter  Facial hematoma, initial encounter     ED Discharge Orders    None       Elizabeth Palau 07/21/19 1008    Gwyneth Sprout, MD 07/21/19 1011

## 2019-07-21 NOTE — ED Notes (Signed)
Pt being transported to Xray and may got to CT if ready. C collar applied.

## 2019-07-30 ENCOUNTER — Ambulatory Visit (INDEPENDENT_AMBULATORY_CARE_PROVIDER_SITE_OTHER): Payer: Medicare Other | Admitting: *Deleted

## 2019-07-30 DIAGNOSIS — I639 Cerebral infarction, unspecified: Secondary | ICD-10-CM

## 2019-07-31 LAB — CUP PACEART REMOTE DEVICE CHECK
Date Time Interrogation Session: 20200916223634
Implantable Pulse Generator Implant Date: 20200302

## 2019-08-05 ENCOUNTER — Ambulatory Visit: Payer: Medicare Other | Admitting: Cardiology

## 2019-08-05 ENCOUNTER — Other Ambulatory Visit: Payer: Medicare Other

## 2019-08-05 NOTE — Progress Notes (Signed)
Carelink Summary Report / Loop Recorder 

## 2019-09-01 ENCOUNTER — Ambulatory Visit (INDEPENDENT_AMBULATORY_CARE_PROVIDER_SITE_OTHER): Payer: Medicare Other | Admitting: *Deleted

## 2019-09-01 DIAGNOSIS — I639 Cerebral infarction, unspecified: Secondary | ICD-10-CM

## 2019-09-02 LAB — CUP PACEART REMOTE DEVICE CHECK
Date Time Interrogation Session: 20201019224004
Implantable Pulse Generator Implant Date: 20200302

## 2019-09-06 ENCOUNTER — Emergency Department (HOSPITAL_COMMUNITY): Payer: Medicare Other

## 2019-09-06 ENCOUNTER — Inpatient Hospital Stay (HOSPITAL_COMMUNITY): Payer: Medicare Other

## 2019-09-06 ENCOUNTER — Other Ambulatory Visit: Payer: Self-pay

## 2019-09-06 ENCOUNTER — Inpatient Hospital Stay (HOSPITAL_COMMUNITY)
Admission: EM | Admit: 2019-09-06 | Discharge: 2019-09-10 | DRG: 070 | Disposition: A | Discharge status: Skilled Nursing Facility | Payer: Medicare Other | Source: Skilled Nursing Facility | Attending: Internal Medicine | Admitting: Internal Medicine

## 2019-09-06 ENCOUNTER — Encounter (HOSPITAL_COMMUNITY): Payer: Self-pay

## 2019-09-06 DIAGNOSIS — R441 Visual hallucinations: Secondary | ICD-10-CM | POA: Diagnosis present

## 2019-09-06 DIAGNOSIS — I69398 Other sequelae of cerebral infarction: Secondary | ICD-10-CM | POA: Diagnosis not present

## 2019-09-06 DIAGNOSIS — R414 Neurologic neglect syndrome: Secondary | ICD-10-CM | POA: Diagnosis present

## 2019-09-06 DIAGNOSIS — K921 Melena: Secondary | ICD-10-CM | POA: Diagnosis present

## 2019-09-06 DIAGNOSIS — K625 Hemorrhage of anus and rectum: Secondary | ICD-10-CM | POA: Diagnosis not present

## 2019-09-06 DIAGNOSIS — E860 Dehydration: Secondary | ICD-10-CM | POA: Diagnosis present

## 2019-09-06 DIAGNOSIS — D509 Iron deficiency anemia, unspecified: Secondary | ICD-10-CM | POA: Diagnosis present

## 2019-09-06 DIAGNOSIS — Z888 Allergy status to other drugs, medicaments and biological substances status: Secondary | ICD-10-CM

## 2019-09-06 DIAGNOSIS — K5900 Constipation, unspecified: Secondary | ICD-10-CM

## 2019-09-06 DIAGNOSIS — K5909 Other constipation: Secondary | ICD-10-CM | POA: Diagnosis not present

## 2019-09-06 DIAGNOSIS — I69354 Hemiplegia and hemiparesis following cerebral infarction affecting left non-dominant side: Secondary | ICD-10-CM | POA: Diagnosis not present

## 2019-09-06 DIAGNOSIS — E162 Hypoglycemia, unspecified: Secondary | ICD-10-CM | POA: Diagnosis not present

## 2019-09-06 DIAGNOSIS — I341 Nonrheumatic mitral (valve) prolapse: Secondary | ICD-10-CM | POA: Diagnosis present

## 2019-09-06 DIAGNOSIS — E785 Hyperlipidemia, unspecified: Secondary | ICD-10-CM | POA: Diagnosis present

## 2019-09-06 DIAGNOSIS — H35039 Hypertensive retinopathy, unspecified eye: Secondary | ICD-10-CM | POA: Diagnosis present

## 2019-09-06 DIAGNOSIS — R531 Weakness: Secondary | ICD-10-CM

## 2019-09-06 DIAGNOSIS — Z90722 Acquired absence of ovaries, bilateral: Secondary | ICD-10-CM | POA: Diagnosis not present

## 2019-09-06 DIAGNOSIS — K219 Gastro-esophageal reflux disease without esophagitis: Secondary | ICD-10-CM | POA: Diagnosis present

## 2019-09-06 DIAGNOSIS — Z90710 Acquired absence of both cervix and uterus: Secondary | ICD-10-CM

## 2019-09-06 DIAGNOSIS — Z9049 Acquired absence of other specified parts of digestive tract: Secondary | ICD-10-CM

## 2019-09-06 DIAGNOSIS — M810 Age-related osteoporosis without current pathological fracture: Secondary | ICD-10-CM | POA: Diagnosis present

## 2019-09-06 DIAGNOSIS — R918 Other nonspecific abnormal finding of lung field: Secondary | ICD-10-CM | POA: Diagnosis not present

## 2019-09-06 DIAGNOSIS — Z8673 Personal history of transient ischemic attack (TIA), and cerebral infarction without residual deficits: Secondary | ICD-10-CM | POA: Diagnosis not present

## 2019-09-06 DIAGNOSIS — Z7901 Long term (current) use of anticoagulants: Secondary | ICD-10-CM

## 2019-09-06 DIAGNOSIS — I10 Essential (primary) hypertension: Secondary | ICD-10-CM | POA: Diagnosis not present

## 2019-09-06 DIAGNOSIS — Z8719 Personal history of other diseases of the digestive system: Secondary | ICD-10-CM | POA: Diagnosis not present

## 2019-09-06 DIAGNOSIS — E46 Unspecified protein-calorie malnutrition: Secondary | ICD-10-CM | POA: Diagnosis not present

## 2019-09-06 DIAGNOSIS — G9341 Metabolic encephalopathy: Secondary | ICD-10-CM | POA: Diagnosis not present

## 2019-09-06 DIAGNOSIS — K5641 Fecal impaction: Secondary | ICD-10-CM | POA: Diagnosis present

## 2019-09-06 DIAGNOSIS — Z681 Body mass index (BMI) 19 or less, adult: Secondary | ICD-10-CM | POA: Diagnosis not present

## 2019-09-06 DIAGNOSIS — E43 Unspecified severe protein-calorie malnutrition: Secondary | ICD-10-CM | POA: Diagnosis present

## 2019-09-06 DIAGNOSIS — K648 Other hemorrhoids: Secondary | ICD-10-CM | POA: Diagnosis present

## 2019-09-06 DIAGNOSIS — Z880 Allergy status to penicillin: Secondary | ICD-10-CM

## 2019-09-06 DIAGNOSIS — M419 Scoliosis, unspecified: Secondary | ICD-10-CM | POA: Diagnosis present

## 2019-09-06 DIAGNOSIS — I4891 Unspecified atrial fibrillation: Secondary | ICD-10-CM | POA: Diagnosis not present

## 2019-09-06 DIAGNOSIS — Z8249 Family history of ischemic heart disease and other diseases of the circulatory system: Secondary | ICD-10-CM

## 2019-09-06 DIAGNOSIS — D649 Anemia, unspecified: Secondary | ICD-10-CM | POA: Diagnosis present

## 2019-09-06 DIAGNOSIS — K649 Unspecified hemorrhoids: Secondary | ICD-10-CM | POA: Diagnosis not present

## 2019-09-06 DIAGNOSIS — Z91048 Other nonmedicinal substance allergy status: Secondary | ICD-10-CM | POA: Diagnosis not present

## 2019-09-06 DIAGNOSIS — Z881 Allergy status to other antibiotic agents status: Secondary | ICD-10-CM | POA: Diagnosis not present

## 2019-09-06 DIAGNOSIS — N939 Abnormal uterine and vaginal bleeding, unspecified: Secondary | ICD-10-CM | POA: Diagnosis present

## 2019-09-06 DIAGNOSIS — Z9181 History of falling: Secondary | ICD-10-CM

## 2019-09-06 DIAGNOSIS — R32 Unspecified urinary incontinence: Secondary | ICD-10-CM | POA: Diagnosis present

## 2019-09-06 DIAGNOSIS — R296 Repeated falls: Secondary | ICD-10-CM | POA: Diagnosis present

## 2019-09-06 DIAGNOSIS — Z20828 Contact with and (suspected) exposure to other viral communicable diseases: Secondary | ICD-10-CM | POA: Diagnosis present

## 2019-09-06 DIAGNOSIS — Z66 Do not resuscitate: Secondary | ICD-10-CM | POA: Diagnosis not present

## 2019-09-06 DIAGNOSIS — R4182 Altered mental status, unspecified: Secondary | ICD-10-CM | POA: Diagnosis present

## 2019-09-06 DIAGNOSIS — Z79899 Other long term (current) drug therapy: Secondary | ICD-10-CM

## 2019-09-06 DIAGNOSIS — R58 Hemorrhage, not elsewhere classified: Secondary | ICD-10-CM

## 2019-09-06 DIAGNOSIS — K589 Irritable bowel syndrome without diarrhea: Secondary | ICD-10-CM | POA: Diagnosis present

## 2019-09-06 LAB — URINALYSIS, ROUTINE W REFLEX MICROSCOPIC
Bacteria, UA: NONE SEEN
Bilirubin Urine: NEGATIVE
Glucose, UA: NEGATIVE mg/dL
Hgb urine dipstick: NEGATIVE
Ketones, ur: 20 mg/dL — AB
Leukocytes,Ua: NEGATIVE
Nitrite: NEGATIVE
Protein, ur: 30 mg/dL — AB
Specific Gravity, Urine: 1.025 (ref 1.005–1.030)
pH: 5 (ref 5.0–8.0)

## 2019-09-06 LAB — CBC
HCT: 43.2 % (ref 36.0–46.0)
HCT: 39.7 % (ref 36.0–46.0)
Hemoglobin: 14 g/dL (ref 12.0–15.0)
Hemoglobin: 13 g/dL (ref 12.0–15.0)
MCH: 30.6 pg (ref 26.0–34.0)
MCH: 31.5 pg (ref 26.0–34.0)
MCHC: 32.4 g/dL (ref 30.0–36.0)
MCHC: 32.7 g/dL (ref 30.0–36.0)
MCV: 94.3 fL (ref 80.0–100.0)
MCV: 96.1 fL (ref 80.0–100.0)
Platelets: 274 10*3/uL (ref 150–400)
Platelets: 250 10*3/uL (ref 150–400)
RBC: 4.58 MIL/uL (ref 3.87–5.11)
RBC: 4.13 MIL/uL (ref 3.87–5.11)
RDW: 14.2 % (ref 11.5–15.5)
RDW: 14.2 % (ref 11.5–15.5)
WBC: 9.2 10*3/uL (ref 4.0–10.5)
WBC: 8.3 10*3/uL (ref 4.0–10.5)
nRBC: 0 % (ref 0.0–0.2)
nRBC: 0 % (ref 0.0–0.2)

## 2019-09-06 LAB — BASIC METABOLIC PANEL
Anion gap: 12 (ref 5–15)
BUN: 23 mg/dL (ref 8–23)
CO2: 22 mmol/L (ref 22–32)
Calcium: 8.7 mg/dL — ABNORMAL LOW (ref 8.9–10.3)
Chloride: 104 mmol/L (ref 98–111)
Creatinine, Ser: 0.56 mg/dL (ref 0.44–1.00)
GFR calc Af Amer: >60 mL/min (ref >60)
GFR calc non Af Amer: >60 mL/min (ref >60)
Glucose, Bld: 98 mg/dL (ref 70–99)
Potassium: 4.2 mmol/L (ref 3.5–5.1)
Sodium: 138 mmol/L (ref 135–145)

## 2019-09-06 LAB — HEPATIC FUNCTION PANEL
ALT: 21 U/L (ref 0–44)
AST: 24 U/L (ref 15–41)
Albumin: 2.6 g/dL — ABNORMAL LOW (ref 3.5–5.0)
Alkaline Phosphatase: 128 U/L — ABNORMAL HIGH (ref 38–126)
Bilirubin, Direct: 0.1 mg/dL (ref 0.0–0.2)
Indirect Bilirubin: 0.6 mg/dL (ref 0.3–0.9)
Total Bilirubin: 0.7 mg/dL (ref 0.3–1.2)
Total Protein: 6.2 g/dL — ABNORMAL LOW (ref 6.5–8.1)

## 2019-09-06 LAB — TSH: TSH: 0.925 u[IU]/mL (ref 0.350–4.500)

## 2019-09-06 LAB — POCT I-STAT 7, (LYTES, BLD GAS, ICA,H+H)
Acid-base deficit: 3 mmol/L — ABNORMAL HIGH (ref 0.0–2.0)
Bicarbonate: 22.1 mmol/L (ref 20.0–28.0)
Calcium, Ion: 1.23 mmol/L (ref 1.15–1.40)
HCT: 37 % (ref 36.0–46.0)
Hemoglobin: 12.6 g/dL (ref 12.0–15.0)
O2 Saturation: 90 %
Patient temperature: 98.7
Potassium: 3.8 mmol/L (ref 3.5–5.1)
Sodium: 138 mmol/L (ref 135–145)
TCO2: 23 mmol/L (ref 22–32)
pCO2 arterial: 39.6 mmHg (ref 32.0–48.0)
pH, Arterial: 7.354 (ref 7.350–7.450)
pO2, Arterial: 62 mmHg — ABNORMAL LOW (ref 83.0–108.0)

## 2019-09-06 LAB — TYPE AND SCREEN
ABO/RH(D): O POS
Antibody Screen: NEGATIVE

## 2019-09-06 LAB — POC OCCULT BLOOD, ED: Fecal Occult Bld: POSITIVE — AB

## 2019-09-06 LAB — TROPONIN I (HIGH SENSITIVITY)
Troponin I (High Sensitivity): 5 ng/L (ref <18)
Troponin I (High Sensitivity): 7 ng/L (ref <18)

## 2019-09-06 LAB — ABO/RH: ABO/RH(D): O POS

## 2019-09-06 LAB — COMPREHENSIVE METABOLIC PANEL
ALT: 18 U/L (ref 0–44)
AST: 18 U/L (ref 15–41)
Albumin: 2.3 g/dL — ABNORMAL LOW (ref 3.5–5.0)
Alkaline Phosphatase: 115 U/L (ref 38–126)
Anion gap: 10 (ref 5–15)
BUN: 23 mg/dL (ref 8–23)
CO2: 23 mmol/L (ref 22–32)
Calcium: 8.1 mg/dL — ABNORMAL LOW (ref 8.9–10.3)
Chloride: 106 mmol/L (ref 98–111)
Creatinine, Ser: 0.6 mg/dL (ref 0.44–1.00)
GFR calc Af Amer: >60 mL/min (ref >60)
GFR calc non Af Amer: >60 mL/min (ref >60)
Glucose, Bld: 70 mg/dL (ref 70–99)
Potassium: 3.9 mmol/L (ref 3.5–5.1)
Sodium: 139 mmol/L (ref 135–145)
Total Bilirubin: 0.8 mg/dL (ref 0.3–1.2)
Total Protein: 5.6 g/dL — ABNORMAL LOW (ref 6.5–8.1)

## 2019-09-06 LAB — CBG MONITORING, ED: Glucose-Capillary: 93 mg/dL (ref 70–99)

## 2019-09-06 LAB — PROTIME-INR
INR: 1.3 — ABNORMAL HIGH (ref 0.8–1.2)
Prothrombin Time: 15.9 seconds — ABNORMAL HIGH (ref 11.4–15.2)

## 2019-09-06 LAB — LIPASE, BLOOD: Lipase: 26 U/L (ref 11–51)

## 2019-09-06 LAB — MAGNESIUM: Magnesium: 1.9 mg/dL (ref 1.7–2.4)

## 2019-09-06 LAB — LACTIC ACID, PLASMA
Lactic Acid, Venous: 1.2 mmol/L (ref 0.5–1.9)
Lactic Acid, Venous: 0.8 mmol/L (ref 0.5–1.9)

## 2019-09-06 LAB — VITAMIN B12: Vitamin B-12: 1162 pg/mL — ABNORMAL HIGH (ref 180–914)

## 2019-09-06 MED ORDER — SODIUM CHLORIDE 0.9 % IV BOLUS (SEPSIS)
500.0000 mL | Freq: Once | INTRAVENOUS | Status: AC
  Administered 2019-09-06: 500 mL via INTRAVENOUS

## 2019-09-06 MED ORDER — VITAMIN B-1 100 MG PO TABS
100.0000 mg | ORAL_TABLET | Freq: Every day | ORAL | Status: DC
  Administered 2019-09-07 – 2019-09-10 (×4): 100 mg via ORAL
  Filled 2019-09-06 (×4): qty 1

## 2019-09-06 MED ORDER — THIAMINE HCL 100 MG/ML IJ SOLN
500.0000 mg | Freq: Once | INTRAVENOUS | Status: AC
  Administered 2019-09-07: 500 mg via INTRAVENOUS
  Filled 2019-09-06: qty 5

## 2019-09-06 MED ORDER — SODIUM CHLORIDE 0.9 % IV BOLUS: 1000.0000 mL | Freq: Once | INTRAVENOUS | Status: DC

## 2019-09-06 MED ORDER — SODIUM CHLORIDE 0.9 % IV SOLN
1000.0000 mL | INTRAVENOUS | Status: DC
  Administered 2019-09-06 – 2019-09-07 (×2): 1000 mL via INTRAVENOUS

## 2019-09-06 NOTE — ED Notes (Signed)
ED TO INPATIENT HANDOFF REPORT  ED Nurse Name and Phone #: [phone] Wendie Simmer., RN  S Name/Age/Gender Madison Odom 85 y.o. female Room/Bed: 017C/017C  Code Status   Code Status: DNR  Home/SNF/Other Skilled nursing facility Patient oriented to: self, place, time and situation Is this baseline? Yes   Triage Complete: Triage complete  Chief Complaint weakness  Triage Note Pt from [facility] assisted living via ems; called out for quarter side blood clots coming from rectum; black tarry stool x 2 weeks; increased weakness, increase in falls; pt on Eliquis; decreased appetite; denies cp, denies sob; pt has loop recorder; pt has bandages on R arm and R leg from fall last week; normally ambulatory with walker, but has not gotten out of bed for past 2 weeks; staff also reports hallucinations  120/72 P 74 94% RA CBG 123 T 99   Allergies Allergies  Allergen Reactions  . Phenothiazines Anaphylaxis  . Prochlorperazine Anaphylaxis  . Benadryl [Diphenhydramine] Other (See Comments)    Agitation and confusion   . Ferrous Sulfate Diarrhea and Other (See Comments)    Abdominal pain, also  . Penicillins Other (See Comments)    Did it involve swelling of the face/tongue/throat, SOB, or low BP? No- yeast infections Did it involve sudden or severe rash/hives, skin peeling, or any reaction on the inside of your mouth or nose? No Did you need to seek medical attention at a hospital or doctor's office? No When did it last happen? "30 years ago" If all above answers are "NO", may proceed with cephalosporin use.   . Tape Other (See Comments)    PATIENT'S SKIN IS THIN AND TEARS VERY EASILY- PLEASE USE A TAPE ALTERNATIVE!! PATIENT ALSO TAKES ELIQUIS.  Marland Kitchen Tetracycline Other (See Comments)    Yeast infections    Level of Care/Admitting Diagnosis ED Disposition    ED Disposition Condition Comment   Admit  Hospital Area: [facility] [[id number]]  Level of Care: Progressive  [102]  Covid Evaluation: Asymptomatic Screening Protocol (No Symptoms)  Diagnosis: Altered mental status [780.97.ICD-9-CM]  Admitting Physician: Gust Rung [[id number]]  Attending Physician: Gust Rung [[id number]]  Estimated length of stay: past midnight tomorrow  Certification:: I certify this patient will need inpatient services for at least 2 midnights  PT Class (Do Not Modify): Inpatient [101]  PT Acc Code (Do Not Modify): Private [1]       B Medical/Surgery History Past Medical History:  Diagnosis Date  . Anemia   . GERD (gastroesophageal reflux disease)   . Hyperlipidemia   . Hypertension   . Hypertensive retinopathy   . MVP (mitral valve prolapse)   . OP (osteoporosis)   . Pelvis fracture (HCC) 09/2015   CLOSED   . Scoliosis   . Spastic colon   . Stroke [facility])    left side neglect   Past Surgical History:  Procedure Laterality Date  . ABDOMINAL HYSTERECTOMY  1965  . APPENDECTOMY    . BILATERAL OOPHORECTOMY  1981  . EYE SURGERY    . LOOP RECORDER INSERTION Left 01/13/2019   Procedure: LOOP RECORDER INSERTION;  Surgeon: Marinus Maw, MD;  Location: [facility] INVASIVE CV LAB;  Service: Cardiovascular;  Laterality: Left;  . ROTATOR CUFF REPAIR Right   . TEE WITHOUT CARDIOVERSION N/A 01/13/2019   Procedure: TRANSESOPHAGEAL ECHOCARDIOGRAM (TEE);  Surgeon: Thurmon Fair, MD;  Location: [facility] ENDOSCOPY;  Service: Cardiovascular;  Laterality: N/A;  loop     A IV Location/Drains/Wounds Patient Lines/Drains/Airways Status  Active Line/Drains/Airways    Name:   Placement date:   Placement time:   Site:   Days:   Peripheral IV 09/06/19 Anterior;Left Forearm   09/06/19    -    Forearm   less than 1   External Urinary Catheter   09/06/19    1347    -   less than 1          Intake/Output Last 24 hours  Intake/Output Summary (Last 24 hours) at 09/06/2019 2303 Last data filed at 09/06/2019 1720 Gross per 24 hour  Intake 500 ml  Output -  Net 500 ml     Labs/Imaging Results for orders placed or performed during the hospital encounter of 09/06/19 (from the past 48 hour(s))  Basic metabolic panel     Status: Abnormal   Collection Time: 09/06/19 11:51 AM  Result Value Ref Range   Sodium 138 135 - 145 mmol/L   Potassium 4.2 3.5 - 5.1 mmol/L   Chloride 104 98 - 111 mmol/L   CO2 22 22 - 32 mmol/L   Glucose, Bld 98 70 - 99 mg/dL   BUN 23 8 - 23 mg/dL   Creatinine, Ser 0.56 0.44 - 1.00 mg/dL   Calcium 8.7 (L) 8.9 - 10.3 mg/dL   GFR calc non Af Amer >60 >60 mL/min   GFR calc Af Amer >60 >60 mL/min   Anion gap 12 5 - 15    Comment: Performed at [facility], [place] [address], [place] [address]  CBC     Status: None   Collection Time: 09/06/19 11:51 AM  Result Value Ref Range   WBC 9.2 4.0 - 10.5 K/uL   RBC 4.58 3.87 - 5.11 MIL/uL   Hemoglobin 14.0 12.0 - 15.0 g/dL   HCT 43.2 36.0 - 46.0 %   MCV 94.3 80.0 - 100.0 fL   MCH 30.6 26.0 - 34.0 pg   MCHC 32.4 30.0 - 36.0 g/dL   RDW 14.2 11.5 - 15.5 %   Platelets 274 150 - 400 K/uL   nRBC 0.0 0.0 - 0.2 %    Comment: Performed at [facility], [place] [address], [place], [place] [address]  Hepatic function panel     Status: Abnormal   Collection Time: 09/06/19 11:51 AM  Result Value Ref Range   Total Protein 6.2 (L) 6.5 - 8.1 g/dL   Albumin 2.6 (L) 3.5 - 5.0 g/dL   AST 24 15 - 41 U/L   ALT 21 0 - 44 U/L   Alkaline Phosphatase 128 (H) 38 - 126 U/L   Total Bilirubin 0.7 0.3 - 1.2 mg/dL   Bilirubin, Direct 0.1 0.0 - 0.2 mg/dL   Indirect Bilirubin 0.6 0.3 - 0.9 mg/dL    Comment: Performed at [facility] [address], [place], [place] [address]  Lipase, blood     Status: None   Collection Time: 09/06/19 11:51 AM  Result Value Ref Range   Lipase 26 11 - 51 U/L    Comment: Performed at [facility] [address] [place], [place] [address]  Troponin I (High Sensitivity)     Status: None   Collection Time: 09/06/19 11:51 AM  Result Value Ref Range    Troponin I (High Sensitivity) 5 <18 ng/L    Comment: (NOTE) Elevated high sensitivity troponin I (hsTnI) values and significant  changes across serial measurements may suggest ACS but many other  chronic and acute conditions are  known to elevate hsTnI results.  Refer to the "Links" section for chest pain algorithms and additional  guidance. Performed at [facility], [address], [place], [place] [address]   Protime-INR     Status: Abnormal   Collection Time: 09/06/19 11:51 AM  Result Value Ref Range   Prothrombin Time 15.9 (H) 11.4 - 15.2 seconds   INR 1.3 (H) 0.8 - 1.2    Comment: (NOTE) INR goal varies based on device and disease states. Performed at [facility], [address] [place], [place] [address]   Type and screen [facility]     Status: None   Collection Time: 09/06/19 11:51 AM  Result Value Ref Range   ABO/RH(D) O POS    Antibody Screen NEG    Sample Expiration      09/09/2019,2359 Performed at [facility], [address], [place], [place] [address]   ABO/Rh     Status: None   Collection Time: 09/06/19 11:51 AM  Result Value Ref Range   ABO/RH(D)      O POS Performed at [facility], [address], [place], [place] [address]   CBG monitoring, ED     Status: None   Collection Time: 09/06/19 11:59 AM  Result Value Ref Range   Glucose-Capillary 93 70 - 99 mg/dL  Lactic acid, plasma     Status: None   Collection Time: 09/06/19 12:15 PM  Result Value Ref Range   Lactic Acid, Venous 1.2 0.5 - 1.9 mmol/L    Comment: Performed at [facility], [address], [place], [place] [address]  POC occult blood, ED Provider will collect     Status: Abnormal   Collection Time: 09/06/19 12:56 PM  Result Value Ref Range   Fecal Occult Bld POSITIVE (A) NEGATIVE  Troponin I (High Sensitivity)     Status: None   Collection Time: 09/06/19  2:10 PM  Result Value Ref Range   Troponin I (High Sensitivity) 7 <18 ng/L     Comment: (NOTE) Elevated high sensitivity troponin I (hsTnI) values and significant  changes across serial measurements may suggest ACS but many other  chronic and acute conditions are known to elevate hsTnI results.  Refer to the "Links" section for chest pain algorithms and additional  guidance. Performed at [facility], [address] [place], [place] [address]   Urinalysis, Routine w reflex microscopic     Status: Abnormal   Collection Time: 09/06/19  7:20 PM  Result Value Ref Range   Color, Urine AMBER (A) YELLOW    Comment: BIOCHEMICALS MAY BE AFFECTED BY COLOR   APPearance CLEAR CLEAR   Specific Gravity, Urine 1.025 1.005 - 1.030   pH 5.0 5.0 - 8.0   Glucose, UA NEGATIVE NEGATIVE mg/dL   Hgb urine dipstick NEGATIVE NEGATIVE   Bilirubin Urine NEGATIVE NEGATIVE   Ketones, ur 20 (A) NEGATIVE mg/dL   Protein, ur 30 (A) NEGATIVE mg/dL   Nitrite NEGATIVE NEGATIVE   Leukocytes,Ua NEGATIVE NEGATIVE   RBC / HPF 0-5 0 - 5 RBC/hpf   WBC, UA 0-5 0 - 5 WBC/hpf   Bacteria, UA NONE SEEN NONE SEEN    Comment: Performed at [facility], [address], [place], [place] [address]  I-STAT 7, (LYTES, BLD GAS, ICA, H+H)     Status: Abnormal   Collection Time: 09/06/19  8:52 PM  Result Value Ref Range   pH, Arterial  7.354 7.350 - 7.450   pCO2 arterial 39.6 32.0 - 48.0 mmHg   pO2, Arterial 62.0 (L) 83.0 - 108.0 mmHg   Bicarbonate 22.1 20.0 - 28.0 mmol/L   TCO2 23 22 - 32 mmol/L   O2 Saturation 90.0 %   Acid-base deficit 3.0 (H) 0.0 - 2.0 mmol/L   Sodium 138 135 - 145 mmol/L   Potassium 3.8 3.5 - 5.1 mmol/L   Calcium, Ion 1.23 1.15 - 1.40 mmol/L   HCT 37.0 36.0 - 46.0 %   Hemoglobin 12.6 12.0 - 15.0 g/dL   Patient temperature 16.1 F    Collection site RADIAL, ALLEN'S TEST ACCEPTABLE    Drawn by RT    Sample type ARTERIAL   CBC     Status: None   Collection Time: 09/06/19  9:28 PM  Result Value Ref Range   WBC 8.3 4.0 - 10.5 K/uL   RBC 4.13 3.87 - 5.11 MIL/uL    Hemoglobin 13.0 12.0 - 15.0 g/dL   HCT 09.6 04.5 - 40.9 %   MCV 96.1 80.0 - 100.0 fL   MCH 31.5 26.0 - 34.0 pg   MCHC 32.7 30.0 - 36.0 g/dL   RDW 81.1 91.4 - 78.2 %   Platelets 250 150 - 400 K/uL   nRBC 0.0 0.0 - 0.2 %    Comment: Performed at [facility], [address], [place], [place] [address]  TSH     Status: None   Collection Time: 09/06/19  9:28 PM  Result Value Ref Range   TSH 0.925 0.350 - 4.500 uIU/mL    Comment: Performed by a 3rd Generation assay with a functional sensitivity of <=0.01 uIU/mL. Performed at [facility], [address] [place], [place] [address]   Vitamin B12     Status: Abnormal   Collection Time: 09/06/19  9:28 PM  Result Value Ref Range   Vitamin B-12 1,162 (H) 180 - 914 pg/mL    Comment: (NOTE) This assay is not validated for testing neonatal or myeloproliferative syndrome specimens for Vitamin B12 levels. Performed at [facility], [address], [place], [place] [address]   Lactic acid, plasma     Status: None   Collection Time: 09/06/19  9:29 PM  Result Value Ref Range   Lactic Acid, Venous 0.8 0.5 - 1.9 mmol/L    Comment: Performed at [facility], [address], [place], [place] [address]  Comprehensive metabolic panel     Status: Abnormal   Collection Time: 09/06/19  9:29 PM  Result Value Ref Range   Sodium 139 135 - 145 mmol/L   Potassium 3.9 3.5 - 5.1 mmol/L   Chloride 106 98 - 111 mmol/L   CO2 23 22 - 32 mmol/L   Glucose, Bld 70 70 - 99 mg/dL   BUN 23 8 - 23 mg/dL   Creatinine, Ser 8.41 0.44 - 1.00 mg/dL   Calcium 8.1 (L) 8.9 - 10.3 mg/dL   Total Protein 5.6 (L) 6.5 - 8.1 g/dL   Albumin 2.3 (L) 3.5 - 5.0 g/dL   AST 18 15 - 41 U/L   ALT 18 0 - 44 U/L   Alkaline Phosphatase 115 38 - 126 U/L   Total Bilirubin 0.8 0.3 - 1.2 mg/dL   GFR calc non Af Amer >60 >60 mL/min   GFR calc Af Amer >60 >60 mL/min   Anion gap 10 5 - 15    Comment: Performed at [facility],  [address] [place],  [place] [id number]  Magnesium     Status: None   Collection Time: 09/06/19  9:29 PM  Result Value Ref Range   Magnesium 1.9 1.7 - 2.4 mg/dL    Comment: Performed at [facility], [address] [place], [place] [address]   Dg Abd 1 View  Result Date: 09/06/2019 CLINICAL DATA:  85 year old female with bleeding. EXAM: ABDOMEN - 1 VIEW COMPARISON:  Pelvic radiograph dated 07/21/2019 FINDINGS: There is a large amount of stool throughout the colon. No bowel dilatation or evidence of obstruction. No free air. There is osteopenia with scoliosis and degenerative changes of the spine. No acute osseous pathology. IMPRESSION: Constipation. No bowel obstruction. Electronically Signed   By: Elgie CollardArash  Radparvar M.D.   On: 09/06/2019 20:54   Ct Head Wo Contrast  Result Date: 09/06/2019 CLINICAL DATA:  Head pain, recent falls EXAM: CT HEAD WITHOUT CONTRAST CT CERVICAL SPINE WITHOUT CONTRAST TECHNIQUE: Multidetector CT imaging of the head and cervical spine was performed following the standard protocol without intravenous contrast. Multiplanar CT image reconstructions of the cervical spine were also generated. COMPARISON:  None. FINDINGS: CT HEAD FINDINGS Brain: No evidence of acute infarction, hemorrhage, hydrocephalus, extra-axial collection or mass lesion/mass effect. Encephalomalacia involving the middle cerebral artery distribution reflects prior infarct. There is mild cerebral volume loss with associated ex vacuo dilatation. Periventricular white matter hypoattenuation likely represents chronic small vessel ischemic disease. Vascular: There are vascular calcifications in the carotid siphons. Skull: Normal. Negative for fracture or focal lesion. Sinuses/Orbits: Left sphenoid and right maxillary sinus disease is noted. Other: None. CT CERVICAL SPINE FINDINGS Alignment: There is 3 mm anterolisthesis of C4 on C5, unchanged. The patient's head is turned to the side. Skull base and vertebrae: No acute fracture. No primary  bone lesion or focal pathologic process. Soft tissues and spinal canal: No prevertebral fluid or swelling. No visible canal hematoma. Disc levels: Varying degrees of multilevel degenerative disc and joint disease are seen in the cervical spine. Upper chest: Pleuroparenchymal scarring is redemonstrated. Other: None IMPRESSION: 1. No acute intracranial process. 2. No acute osseous injury in the cervical spine. Electronically Signed   By: Romona Curlsyler  Litton M.D.   On: 09/06/2019 14:20   Ct Cervical Spine Wo Contrast  Result Date: 09/06/2019 CLINICAL DATA:  Head pain, recent falls EXAM: CT HEAD WITHOUT CONTRAST CT CERVICAL SPINE WITHOUT CONTRAST TECHNIQUE: Multidetector CT imaging of the head and cervical spine was performed following the standard protocol without intravenous contrast. Multiplanar CT image reconstructions of the cervical spine were also generated. COMPARISON:  None. FINDINGS: CT HEAD FINDINGS Brain: No evidence of acute infarction, hemorrhage, hydrocephalus, extra-axial collection or mass lesion/mass effect. Encephalomalacia involving the middle cerebral artery distribution reflects prior infarct. There is mild cerebral volume loss with associated ex vacuo dilatation. Periventricular white matter hypoattenuation likely represents chronic small vessel ischemic disease. Vascular: There are vascular calcifications in the carotid siphons. Skull: Normal. Negative for fracture or focal lesion. Sinuses/Orbits: Left sphenoid and right maxillary sinus disease is noted. Other: None. CT CERVICAL SPINE FINDINGS Alignment: There is 3 mm anterolisthesis of C4 on C5, unchanged. The patient's head is turned to the side. Skull base and vertebrae: No acute fracture. No primary bone lesion or focal pathologic process. Soft tissues and spinal canal: No prevertebral fluid or swelling. No visible canal hematoma. Disc levels: Varying degrees of multilevel degenerative disc and joint disease are seen in the cervical spine.  Upper chest: Pleuroparenchymal scarring is redemonstrated. Other:  None IMPRESSION: 1. No acute intracranial process. 2. No acute osseous injury in the cervical spine. Electronically Signed   By: Romona Curls M.D.   On: 09/06/2019 14:20   Dg Chest Port 1 View  Result Date: 09/06/2019 CLINICAL DATA:  Status post fall EXAM: PORTABLE CHEST 1 VIEW COMPARISON:  02/03/2019 FINDINGS: Patient is rotated towards the left. No definite focal consolidation, pleural effusion or pneumothorax. Stable cardiomediastinal silhouette. No acute osseous abnormality. 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 Generalized osteopenia. IMPRESSION: No acute cardiopulmonary disease. Electronically Signed   By: Elige Ko   On: 09/06/2019 12:54    Pending Labs Unresulted Labs (From admission, onward)    Start     Ordered   09/07/19 0500  CBC  Tomorrow morning,   R     09/06/19 2005   09/07/19 0500  Basic metabolic panel  Tomorrow morning,   R     09/06/19 2005   09/06/19 1822  Methylmalonic acid, serum  Once,   STAT     09/06/19 1825   09/06/19 1731  SARS CORONAVIRUS 2 (TAT 6-24 HRS) Nasopharyngeal Nasopharyngeal Swab  (Asymptomatic/Tier 2)  Once,   STAT    Question Answer Comment  Is this test for diagnosis or screening Screening   Symptomatic for COVID-19 as defined by CDC No   Hospitalized for COVID-19 No   Admitted to ICU for COVID-19 No   Previously tested for COVID-19 No   Resident in a congregate (group) care setting No   Employed in healthcare setting No   Pregnant No      09/06/19 1730   09/06/19 1208  Urine culture  ONCE - STAT,   STAT     09/06/19 1208   09/06/19 1208  Culture, blood (routine x 2)  BLOOD CULTURE X 2,   STAT     09/06/19 1208          Vitals/Pain Today's Vitals   09/06/19 1845 09/06/19 1920 09/06/19 2045 09/06/19 2105  BP: (!) 123/47  (!) 115/49 (!) 107/51  Pulse: 64  66 66  Resp: (!) 22  18 (!) 27  Temp:      TempSrc:      SpO2: 97%  94% 95%  Weight:       Height:      PainSc:  0-No pain      Isolation Precautions Droplet and Contact precautions  Medications Medications  sodium chloride 0.9 % bolus 500 mL (0 mLs Intravenous Stopped 09/06/19 1720)    Followed by  0.9 %  sodium chloride infusion (1,000 mLs Intravenous New Bag/Given 09/06/19 1720)  thiamine  in normal saline (50ml) IVPB (has no administration in time range)  thiamine (VITAMIN B-1) tablet 100 mg (has no administration in time range)    Mobility walks with device High fall risk   Focused Assessments Cardiac Assessment Handoff:  Cardiac Rhythm: Normal sinus rhythm Lab Results  Component Value Date   TROPONINI 0.07 (H) 09/22/2015   No results found for: DDIMER Does the Patient currently have chest pain? No  , Neuro Assessment Handoff:  Swallow screen pass? N/A Cardiac Rhythm: Normal sinus rhythm       Neuro Assessment: Within Defined Limits Neuro Checks:      Last Documented NIHSS Modified Score:   Has TPA been given? No If patient is a Neuro Trauma and patient is going to OR before floor call report to 4N Charge nurse: [phone] or [phone]     R Recommendations: See  Admitting Provider Note  Report given to:   Additional Notes:

## 2019-09-06 NOTE — ED Triage Notes (Signed)
Pt from [facility] assisted living via ems; called out for quarter side blood clots coming from rectum; black tarry stool x 2 weeks; increased weakness, increase in falls; pt on Eliquis; decreased appetite; denies cp, denies sob; pt has loop recorder; pt has bandages on R arm and R leg from fall last week; normally ambulatory with walker, but has not gotten out of bed for past 2 weeks; staff also reports hallucinations  120/72 P 74 94% RA CBG 123 T 99

## 2019-09-06 NOTE — ED Notes (Signed)
Daughter at bedside.

## 2019-09-06 NOTE — ED Provider Notes (Addendum)
[facility] EMERGENCY DEPARTMENT Provider Note   CSN: [id number] Arrival date & time: 09/06/19  1131     History   Chief Complaint Chief Complaint  Patient presents with   Weakness    HPI Madison Odom is a 85 y.o. female.     HPI Patient reports that she has been falling quite a bit more over the past several days.  She reports that she uses a rolling walker and is starting to lose her balance and fall even while using her walker.  She reports she is got a lot of skin tears on her arms which are getting dressed at her nursing home.  She denies she has any specific areas that are causing her pain.  She has not complaining of any headache, chest pain or focal extremity pain.  Patient is also being sent to the emergency department for report of quarter sized blood clots from the rectum.  Patient is on Eliquis.  The patient's daughter also reports that she has been more confused over the past couple of days and sometimes seems to be hallucinating.  The patient daughter reports that she has not seem to know how to use her phone.  She reports this kind of mental status change is very unusual for her mother who is typically quite sharp.  She is concerned about a urinary tract infection.  She reports that a specimen was taken on Wednesday but no results have been gotten from the nursing home.  Her daughter reports that she has been eating and drinking very little and is concerned she is dehydrated. Past Medical History:  Diagnosis Date   Anemia    GERD (gastroesophageal reflux disease)    Hyperlipidemia    Hypertension    Hypertensive retinopathy    MVP (mitral valve prolapse)    OP (osteoporosis)    Pelvis fracture (HCC) 09/2015   CLOSED    Scoliosis    Spastic colon    Stroke [facility])    left side neglect    Patient Active Problem List   Diagnosis Date Noted   Stroke (cerebrum) (HCC) 02/03/2019   Fall 02/03/2019   Acute metabolic encephalopathy  02/03/2019   Leukocytosis 02/03/2019   Hypotension 02/03/2019   Iron deficiency anemia 01/15/2019   Acute ischemic right MCA stroke (HCC) 01/14/2019   Acute CVA (cerebrovascular accident) (HCC) 01/09/2019   Epiretinal membrane, left eye 04/18/2017   Acute blood loss anemia 09/27/2015   Pelvic fracture (HCC) 09/21/2015   Closed fracture of pelvis (HCC)    Hypertensive retinopathy of both eyes 08/16/2015   Hypertension 01/28/2014   Kyphoscoliosis 09/16/2013   After cataract 02/19/2013   Pseudophakia of both eyes 01/22/2013   Posterior capsular opacification 10/29/2012   Cystoid macular edema 04/30/2012   Epiretinal membrane 04/30/2012   MVP (mitral valve prolapse) 05/24/2011   Hypercholesterolemia 05/24/2011    Past Surgical History:  Procedure Laterality Date   ABDOMINAL HYSTERECTOMY  1965   APPENDECTOMY     BILATERAL OOPHORECTOMY  1981   EYE SURGERY     LOOP RECORDER INSERTION Left 01/13/2019   Procedure: LOOP RECORDER INSERTION;  Surgeon: Marinus Maw, MD;  Location: [facility] INVASIVE CV LAB;  Service: Cardiovascular;  Laterality: Left;   ROTATOR CUFF REPAIR Right    TEE WITHOUT CARDIOVERSION N/A 01/13/2019   Procedure: TRANSESOPHAGEAL ECHOCARDIOGRAM (TEE);  Surgeon: Thurmon Fair, MD;  Location: [facility] ENDOSCOPY;  Service: Cardiovascular;  Laterality: N/A;  loop     OB History  No obstetric history on file.      Home Medications    Prior to Admission medications   Medication Sig Start Date End Date Taking? Authorizing Provider  acetaminophen (TYLENOL) 325 MG tablet Take 1-2 tablets (325-650 mg total) by mouth every 4 (four) hours as needed for mild pain. 01/17/19   Love, Evlyn Kanner, PA-C  apixaban (ELIQUIS) 2.5 MG TABS tablet Take 2.5 mg by mouth 2 (two) times daily.    [provider]  Calcium Carb-Cholecalciferol (CALCIUM 500 +D PO) Take 1 tablet by mouth daily.     [provider]  ferrous sulfate 325 (65 FE) MG tablet Take 1 tablet  (325 mg total) by mouth daily at 12 noon. 01/17/19   Love, Evlyn Kanner, PA-C  loperamide (IMODIUM) 2 MG capsule Take 1 capsule by mouth as needed. 03/21/19   [provider]  Multiple Vitamin (MULTIVITAMIN) tablet Take 1 tablet by mouth daily.    [provider]  nadolol (CORGARD) 20 MG tablet Take 1 tablet (20 mg total) by mouth daily. 02/04/19   Zannie Cove, MD  pantoprazole (PROTONIX) 40 MG tablet TAKE 1 TABLET BY MOUTH EVERY DAY 02/10/19   Kirsteins, Victorino Sparrow, MD  simvastatin (ZOCOR) 40 MG tablet TAKE 1 TABLET BY MOUTH EVERY DAY AT 6 PM. 02/10/19   Kirsteins, Victorino Sparrow, MD  traZODone (DESYREL) 50 MG tablet Take 50 mg by mouth at bedtime. 03/19/19   [provider]    Family History Family History  Problem Relation Age of Onset   Heart attack Mother    Hypertension Mother    Heart disease Father    Hypertension Father    Stroke Neg Hx     Social History Social History   Tobacco Use   Smoking status: Never Smoker   Smokeless tobacco: Never Used  Substance Use Topics   Alcohol use: No   Drug use: No     Allergies   Phenothiazines, Prochlorperazine, Benadryl [diphenhydramine], Ferrous sulfate, Penicillins, and Tetracycline   Review of Systems Review of Systems 10 Systems reviewed and are negative for acute change except as noted in the HPI.  Physical Exam Updated Vital Signs BP (!) 111/46    Pulse (!) 52    Temp 97.8 F (36.6 C) (Oral)    Resp 18    Ht  (1.473 m)    Wt 35.4 kg    SpO2 98%    BMI 16.30 kg/m   Physical Exam Constitutional:      Comments: Patient is alert and interactive.  She does appear very frail and thin.  No respiratory distress.  Speech is clear.  HENT:     Head: Normocephalic and atraumatic.     Mouth/Throat:     Mouth: Mucous membranes are moist.     Pharynx: Oropharynx is clear.  Eyes:     Extraocular Movements: Extraocular movements intact.     Pupils: Pupils are equal, round, and reactive to light.   Neck:     Musculoskeletal: Neck supple.     Comments: Patient denies any focal C-spine tenderness. Cardiovascular:     Rate and Rhythm: Normal rate and regular rhythm.  Pulmonary:     Effort: Pulmonary effort is normal.     Breath sounds: Normal breath sounds.  Abdominal:     General: There is no distension.     Palpations: Abdomen is soft.     Tenderness: There is no abdominal tenderness. There is no guarding.  Genitourinary:  Comments: Examination of the rectal area shows red blood, some semiclotted all around the gluteal cleft and rectum.  Digital exam is for a fecal impaction.  The stool is blood coated but the stool itself appears to be normal greenish-brown in color. Musculoskeletal:     Comments: Patient has severe kyphosis of the back.  He is not endorsing any point tenderness over the back.  Chest wall is commensurately irregular.  She is not endorsing pain to compression of the chest wall.  I can put both lower extremities to flexion extension motion and she denies pain.  Patient does have multiple senile ecchymosis and skin tears on the upper and lower extremities.  Skin:    General: Skin is warm and dry.  Neurological:     Comments: Patient is alert.  Her speech is clear and has normal content.  Reportedly, she has had hallucinations although at this time she seems to be well oriented situationally with fairly good recall.  She does have significant physical limitations due to body habitus but follows commands for doing grip strength and moving both extremities and lower extremity without evidence of focal significant deficit.  Psychiatric:        Mood and Affect: Mood normal.      ED Treatments / Results  Labs (all labs ordered are listed, but only abnormal results are displayed) Labs Reviewed  BASIC METABOLIC PANEL - Abnormal; Notable for the following components:      Result Value   Calcium 8.7 (*)    All other components within normal limits  HEPATIC FUNCTION  PANEL - Abnormal; Notable for the following components:   Total Protein 6.2 (*)    Albumin 2.6 (*)    Alkaline Phosphatase 128 (*)    All other components within normal limits  PROTIME-INR - Abnormal; Notable for the following components:   Prothrombin Time 15.9 (*)    INR 1.3 (*)    All other components within normal limits  POC OCCULT BLOOD, ED - Abnormal; Notable for the following components:   Fecal Occult Bld POSITIVE (*)    All other components within normal limits  URINE CULTURE  CULTURE, BLOOD (ROUTINE X 2)  CULTURE, BLOOD (ROUTINE X 2)  CBC  LIPASE, BLOOD  LACTIC ACID, PLASMA  LACTIC ACID, PLASMA  URINALYSIS, ROUTINE W REFLEX MICROSCOPIC  CBG MONITORING, ED  TYPE AND SCREEN  ABO/RH  TROPONIN I (HIGH SENSITIVITY)  TROPONIN I (HIGH SENSITIVITY)    EKG EKG Interpretation  Date/Time:  Saturday September 06 2019 11:52:38 EDT Ventricular Rate:  71 PR Interval:    QRS Duration: 82 QT Interval:  416 QTC Calculation: 453 R Axis:     Text Interpretation:  Sinus rhythm Prolonged PR interval normal, nosig change from previousexcept rate slower Confirmed by Arby BarrettePfeiffer, Cyan Moultrie [phone]([id number]) on 09/06/2019 1:00:34 PM   Radiology Ct Head Wo Contrast  Result Date: 09/06/2019 CLINICAL DATA:  Head pain, recent falls EXAM: CT HEAD WITHOUT CONTRAST CT CERVICAL SPINE WITHOUT CONTRAST TECHNIQUE: Multidetector CT imaging of the head and cervical spine was performed following the standard protocol without intravenous contrast. Multiplanar CT image reconstructions of the cervical spine were also generated. COMPARISON:  None. FINDINGS: CT HEAD FINDINGS Brain: No evidence of acute infarction, hemorrhage, hydrocephalus, extra-axial collection or mass lesion/mass effect. Encephalomalacia involving the middle cerebral artery distribution reflects prior infarct. There is mild cerebral volume loss with associated ex vacuo dilatation. Periventricular white matter hypoattenuation likely represents chronic  small vessel ischemic disease. Vascular: There are vascular  calcifications in the carotid siphons. Skull: Normal. Negative for fracture or focal lesion. Sinuses/Orbits: Left sphenoid and right maxillary sinus disease is noted. Other: None. CT CERVICAL SPINE FINDINGS Alignment: There is 3 mm anterolisthesis of C4 on C5, unchanged. The patient's head is turned to the side. Skull base and vertebrae: No acute fracture. No primary bone lesion or focal pathologic process. Soft tissues and spinal canal: No prevertebral fluid or swelling. No visible canal hematoma. Disc levels: Varying degrees of multilevel degenerative disc and joint disease are seen in the cervical spine. Upper chest: Pleuroparenchymal scarring is redemonstrated. Other: None IMPRESSION: 1. No acute intracranial process. 2. No acute osseous injury in the cervical spine. Electronically Signed   By: Romona Curls M.D.   On: 09/06/2019 14:20   Ct Cervical Spine Wo Contrast  Result Date: 09/06/2019 CLINICAL DATA:  Head pain, recent falls EXAM: CT HEAD WITHOUT CONTRAST CT CERVICAL SPINE WITHOUT CONTRAST TECHNIQUE: Multidetector CT imaging of the head and cervical spine was performed following the standard protocol without intravenous contrast. Multiplanar CT image reconstructions of the cervical spine were also generated. COMPARISON:  None. FINDINGS: CT HEAD FINDINGS Brain: No evidence of acute infarction, hemorrhage, hydrocephalus, extra-axial collection or mass lesion/mass effect. Encephalomalacia involving the middle cerebral artery distribution reflects prior infarct. There is mild cerebral volume loss with associated ex vacuo dilatation. Periventricular white matter hypoattenuation likely represents chronic small vessel ischemic disease. Vascular: There are vascular calcifications in the carotid siphons. Skull: Normal. Negative for fracture or focal lesion. Sinuses/Orbits: Left sphenoid and right maxillary sinus disease is noted. Other: None. CT  CERVICAL SPINE FINDINGS Alignment: There is 3 mm anterolisthesis of C4 on C5, unchanged. The patient's head is turned to the side. Skull base and vertebrae: No acute fracture. No primary bone lesion or focal pathologic process. Soft tissues and spinal canal: No prevertebral fluid or swelling. No visible canal hematoma. Disc levels: Varying degrees of multilevel degenerative disc and joint disease are seen in the cervical spine. Upper chest: Pleuroparenchymal scarring is redemonstrated. Other: None IMPRESSION: 1. No acute intracranial process. 2. No acute osseous injury in the cervical spine. Electronically Signed   By: Romona Curls M.D.   On: 09/06/2019 14:20   Dg Chest Port 1 View  Result Date: 09/06/2019 CLINICAL DATA:  Status post fall EXAM: PORTABLE CHEST 1 VIEW COMPARISON:  02/03/2019 FINDINGS: Patient is rotated towards the left. No definite focal consolidation, pleural effusion or pneumothorax. Stable cardiomediastinal silhouette. No acute osseous abnormality. 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 Generalized osteopenia. IMPRESSION: No acute cardiopulmonary disease. Electronically Signed   By: Elige Ko   On: 09/06/2019 12:54    Procedures Procedures (including critical care time) ns Ordered in ED Medications  sodium chloride 0.9 % bolus 500 mL (has no administration in time range)    Followed by  0.9 %  sodium chloride infusion (has no administration in time range)     Initial Impression / Assessment and Plan / ED Course  I have reviewed the triage vital signs and the nursing notes.  Pertinent labs & imaging results that were available during my care of the patient were reviewed by me and considered in my medical decision making (see chart for details).        Patient presents as outlined above.  She has several active problems.  Her daughter reports she has been coming increasingly confused over the past several days and has had multiple falls.  There is  concern for possible UTI.  Still awaiting urine specimen.  We will try rehydration and have the nurses get a catheter specimen.  Patient does have large amount of fresh rectal blood present.  There was stool impaction present which I did manually disimpact.  I suspect bleeding is local rectally due to either internal hemorrhoids or rectal tear from impaction but there is large amount of fresh bleed in rectal vault..  Patient's hemoglobin is stable and she does not require any blood transfusion at this time. She will need Hg trended. I am not opting to start Protonix because I feel this is very distal rectal bleeding. Final Clinical Impressions(s) / ED Diagnoses   Final diagnoses:  Generalized weakness  Frequent falls  Rectal bleed    ED Discharge Orders    None       Charlesetta Shanks, MD 09/06/19 1555    Charlesetta Shanks, MD 09/06/19 [id number]

## 2019-09-06 NOTE — H&P (Signed)
Date: 09/06/2019               Patient Name:  Madison Odom MRN: [id number]  DOB: 1934/07/11 Age / Sex: 85 y.o., female   PCP: Joycelyn Man, NP         Medical Service: Internal Medicine Teaching Service         Attending Physician: Dr. Gust Rung, DO    First Contact: Dr. Marchia Bond Pager: [phone]  Second Contact: Dr. Cleaster Corin Pager: [phone]       After Hours (After 5p/  First Contact Pager: [phone]  weekends / holidays): Second Contact Pager: [phone]   Chief Complaint: Altered mental status  History of Present Illness: Adelynn Ardolino is an 85 year old female with a pertinent past medical history of hypertension CVA (February 2020 -with left-sided weakness and hemineglect), and atrial fibrillation who presented to [facility] with a roughly 2-week history of altered mental status.  Jorde of the history was provided by the patient's daughter who is at bedside.  [name] states that the patient was in her normal state of health until February when she had her acute CVA.  Patient was noted to have increased number of falls since then and subsequently required assisted living placement at [facility]  Within the last 2 weeks the patient was noted to become increasingly clumsy having multiple falls.  During this time she also became more lethargic sleeping more, disoriented, with decreased appetite.  Patient was noted to have several episodes of visual hallucinations.  [name] states that this is very uncommon for her.  She is usually well oriented to person place time and events and can usually manage the majority of her ADLs without additional assistance.  Due to recent Covid restrictions at her assisted living facility the patient started was unable to comment on whether or not the patient has had acute changes to her health otherwise, but states that she is often incontinent of urine.  She is unaware if the patient has had increased shortness of breath or cough  lately.  Patient's recent admission was precipitated by recent bright red blood per rectum.  Patient has been on Eliquis for her previous CVA as well as atrial fibrillation.  Patient did have a urinalysis performed at her facility.  I called her facility but they have not received the results as of yet.   Social:  Lives at [facility] assisted [facility]   Family History:  Family History  Problem Relation Age of Onset   Heart attack Mother    Hypertension Mother    Heart disease Father    Hypertension Father    Stroke Neg Hx      Meds:  Current Meds  Medication Sig   acetaminophen (TYLENOL) 325 MG tablet Take 1-2 tablets (325-650 mg total) by mouth every 4 (four) hours as needed for mild pain. (Patient taking differently: Take 650 mg by mouth every 4 (four) hours as needed for mild pain. )   apixaban (ELIQUIS) 2.5 MG TABS tablet Take 2.5 mg by mouth 2 (two) times daily.   buPROPion (WELLBUTRIN) 75 MG tablet Take 37.5 mg by mouth every morning.   Calcium Carb-Cholecalciferol (OYSTER CALCIUM/D3) 500-200 MG-UNIT TABS Take 1 tablet by mouth daily.   calcium carbonate (TUMS EXTRA STRENGTH 750) 750 MG chewable tablet Chew 2 tablets by mouth as needed (for indigestion).   docusate sodium (COLACE) 100 MG capsule Take 100 mg by mouth 2 (two) times daily.   Ensure (ENSURE) Take  237 mLs by mouth daily.   ferrous sulfate (FERROUSUL) 325 (65 FE) MG tablet Take 325 mg by mouth daily with breakfast.   loperamide (IMODIUM A-D) 2 MG tablet Take 2 mg by mouth daily as needed (for diarrhea).   Melatonin 10 MG CAPS Take 10 mg by mouth at bedtime.   Menthol, Topical Analgesic, (BIOFREEZE) 4 % GEL Apply 1 application topically See admin instructions. Apply to knees and lower back topically every 8 hours as needed for pain   Multiple Vitamins-Minerals (CEROVITE SENIOR) TABS Take 1 tablet by mouth daily with breakfast.   nadolol (CORGARD) 20 MG tablet Take 1 tablet (20 mg total) by mouth daily.  (Patient taking differently: Take 20 mg by mouth at bedtime. )   pantoprazole (PROTONIX) 40 MG tablet TAKE 1 TABLET BY MOUTH EVERY DAY (Patient taking differently: Take 40 mg by mouth daily. )   simvastatin (ZOCOR) 40 MG tablet TAKE 1 TABLET BY MOUTH EVERY DAY AT 6 PM. (Patient taking differently: Take 40 mg by mouth at bedtime. )     Allergies: Allergies as of 09/06/2019 - Review Complete 09/06/2019  Allergen Reaction Noted   Phenothiazines Anaphylaxis 05/15/2011   Prochlorperazine Anaphylaxis 04/30/2012   Benadryl [diphenhydramine] Other (See Comments) 01/12/2019   Ferrous sulfate Diarrhea and Other (See Comments) 01/17/2019   Penicillins Other (See Comments) 05/24/2011   Tape Other (See Comments) 09/06/2019   Tetracycline Other (See Comments) 01/13/2014   Past Medical History:  Diagnosis Date   Anemia    GERD (gastroesophageal reflux disease)    Hyperlipidemia    Hypertension    Hypertensive retinopathy    MVP (mitral valve prolapse)    OP (osteoporosis)    Pelvis fracture (HCC) 09/2015   CLOSED    Scoliosis    Spastic colon    Stroke (HCC)    left side neglect     Review of Systems: A complete ROS was negative except as per HPI.   Physical Exam: Blood pressure (!) 112/51, pulse 66, temperature 97.8 F (36.6 C), temperature source Oral, resp. rate (!) 21, height 4\' 10"  (1.473 m), weight 35.4 kg, SpO2 98 %. Physical Exam  Constitutional: She is oriented to person, place, and time. She appears cachectic. She appears toxic. She has a sickly appearance. No distress.  Eyes: EOM are normal.  Neck: Normal range of motion. No JVD present. No tracheal deviation present. No thyromegaly present.  Cardiovascular: Normal rate, regular rhythm, normal heart sounds and intact distal pulses. Exam reveals no gallop and no friction rub.  No murmur heard. MAP in the room was roughly 66  Pulmonary/Chest: Effort normal and breath sounds normal. No respiratory  distress. She has no wheezes. She has no rales. She exhibits no tenderness.  Abdominal: Soft. She exhibits no distension. Bowel sounds are hyperactive. There is no abdominal tenderness.  Bright red blood per rectum  Musculoskeletal: Normal range of motion.        General: No tenderness or edema.  Lymphadenopathy:    She has no cervical adenopathy.  Neurological: She is alert and oriented to person, place, and time. She displays weakness.  Patient seemed very sleepy during examination.  She was alert and oriented to person and place but seemed to lack insight into her medical conditions and the reason that she presented to the hospital.  Skin: She is diaphoretic. There is pallor.     EKG: personally reviewed my interpretation is Sinus rhythm Prolonged PR interval normal, nosig change from previousexcept rate slower  CXR: personally reviewed my interpretation is   Assessment & Plan by Problem: Active Problems:   Altered mental status  Patient is a 85 year old female with a pertinent past medical history of atrial fibrillation, anemia, hypertension, history of CVA, who presented with 2 weeks of acute altered mental status changes, problems with balance and falls, increase sleeping, and hallucination.  #Acute Encephalopathy Related to history of acute altered mental status, increased falls, increased sleepiness, and visual hallucinations concerning for acute encephalopathy of unknown etiology. Patient does appear acutely ill. Patient does not have a leukocytosis or a elevation her lactic acid.  We ordered a urinalysis but but the result is still pending. Patient has not had any recent changes in her medications.  Patient does have a history of CVA, but the patient does not have any gross neurologic deficits on exam.  There is nothing history to suggest a recent seizure, and patient does not have any history of postictal state.  Patient's fecal occult blood test was positive but her CBC does not  show any signs or symptoms concerning for acute drop in hemoglobin.  Patient's blood pressures are mildly low with borderline hypotension during evaluation. - Repeat CBC and CMP (likely will need to repeat set of labs) - IVF - 500 bolus and start maintenance fluids.  - Consider abdominal imaging - KUB - Blood cultures pending - UA pending - ABG pending - TSH pending   Diet: NPO VTE: SCDs IVF: NS Code: Full code  Dispo: Admit patient to Inpatient with expected length of stay greater than 2 midnights.  Signed: Marianna Payment, MD 09/06/2019, 6:42 PM  Pager: [phone]

## 2019-09-06 NOTE — ED Notes (Signed)
Pt transferred to hospital bed. Resting comfortably 

## 2019-09-07 ENCOUNTER — Encounter (HOSPITAL_COMMUNITY): Payer: Self-pay

## 2019-09-07 ENCOUNTER — Inpatient Hospital Stay (HOSPITAL_COMMUNITY): Payer: Medicare Other

## 2019-09-07 DIAGNOSIS — Z8673 Personal history of transient ischemic attack (TIA), and cerebral infarction without residual deficits: Secondary | ICD-10-CM

## 2019-09-07 DIAGNOSIS — Z90710 Acquired absence of both cervix and uterus: Secondary | ICD-10-CM

## 2019-09-07 DIAGNOSIS — R4182 Altered mental status, unspecified: Secondary | ICD-10-CM

## 2019-09-07 DIAGNOSIS — E162 Hypoglycemia, unspecified: Secondary | ICD-10-CM | POA: Diagnosis not present

## 2019-09-07 DIAGNOSIS — Z90722 Acquired absence of ovaries, bilateral: Secondary | ICD-10-CM

## 2019-09-07 DIAGNOSIS — N939 Abnormal uterine and vaginal bleeding, unspecified: Secondary | ICD-10-CM

## 2019-09-07 LAB — BASIC METABOLIC PANEL
Anion gap: 12 (ref 5–15)
BUN: 24 mg/dL — ABNORMAL HIGH (ref 8–23)
CO2: 21 mmol/L — ABNORMAL LOW (ref 22–32)
Calcium: 8.3 mg/dL — ABNORMAL LOW (ref 8.9–10.3)
Chloride: 107 mmol/L (ref 98–111)
Creatinine, Ser: 0.58 mg/dL (ref 0.44–1.00)
GFR calc Af Amer: >60 mL/min (ref >60)
GFR calc non Af Amer: >60 mL/min (ref >60)
Glucose, Bld: 50 mg/dL — ABNORMAL LOW (ref 70–99)
Potassium: 4 mmol/L (ref 3.5–5.1)
Sodium: 140 mmol/L (ref 135–145)

## 2019-09-07 LAB — SARS CORONAVIRUS 2 (TAT 6-24 HRS): SARS Coronavirus 2: NEGATIVE

## 2019-09-07 LAB — CBC
HCT: 41.4 % (ref 36.0–46.0)
Hemoglobin: 12.9 g/dL (ref 12.0–15.0)
MCH: 30.5 pg (ref 26.0–34.0)
MCHC: 31.2 g/dL (ref 30.0–36.0)
MCV: 97.9 fL (ref 80.0–100.0)
Platelets: 236 10*3/uL (ref 150–400)
RBC: 4.23 MIL/uL (ref 3.87–5.11)
RDW: 14.3 % (ref 11.5–15.5)
WBC: 9.2 10*3/uL (ref 4.0–10.5)
nRBC: 0 % (ref 0.0–0.2)

## 2019-09-07 LAB — URINE CULTURE: Culture: NO GROWTH

## 2019-09-07 LAB — GLUCOSE, CAPILLARY
Glucose-Capillary: 45 mg/dL — ABNORMAL LOW (ref 70–99)
Glucose-Capillary: 57 mg/dL — ABNORMAL LOW (ref 70–99)
Glucose-Capillary: 57 mg/dL — ABNORMAL LOW (ref 70–99)
Glucose-Capillary: 61 mg/dL — ABNORMAL LOW (ref 70–99)
Glucose-Capillary: 92 mg/dL (ref 70–99)
Glucose-Capillary: 257 mg/dL — ABNORMAL HIGH (ref 70–99)
Glucose-Capillary: 91 mg/dL (ref 70–99)

## 2019-09-07 MED ORDER — SENNOSIDES-DOCUSATE SODIUM 8.6-50 MG PO TABS
1.0000 | ORAL_TABLET | Freq: Every day | ORAL | Status: DC
  Administered 2019-09-07 – 2019-09-09 (×3): 1 via ORAL
  Filled 2019-09-07 (×3): qty 1

## 2019-09-07 MED ORDER — DEXTROSE-NACL 5-0.45 % IV SOLN: INTRAVENOUS | Status: DC

## 2019-09-07 MED ORDER — SODIUM CHLORIDE 0.9 % IV SOLN: 1000.0000 mL | INTRAVENOUS | Status: DC

## 2019-09-07 MED ORDER — FLUTICASONE PROPIONATE 50 MCG/ACT NA SUSP
1.0000 | Freq: Every day | NASAL | Status: DC | PRN
  Filled 2019-09-07: qty 16

## 2019-09-07 MED ORDER — PANTOPRAZOLE SODIUM 40 MG PO TBEC
40.0000 mg | DELAYED_RELEASE_TABLET | Freq: Every day | ORAL | Status: DC
  Administered 2019-09-08 – 2019-09-10 (×4): 40 mg via ORAL
  Filled 2019-09-07 (×4): qty 1

## 2019-09-07 MED ORDER — ENSURE ENLIVE PO LIQD
237.0000 mL | Freq: Two times a day (BID) | ORAL | Status: DC
  Administered 2019-09-07 – 2019-09-08 (×2): 237 mL via ORAL

## 2019-09-07 MED ORDER — SIMVASTATIN 20 MG PO TABS
40.0000 mg | ORAL_TABLET | Freq: Every day | ORAL | Status: DC
  Administered 2019-09-07 – 2019-09-09 (×3): 40 mg via ORAL
  Filled 2019-09-07 (×4): qty 2

## 2019-09-07 MED ORDER — MELATONIN 3 MG PO TABS
6.0000 mg | ORAL_TABLET | Freq: Every evening | ORAL | Status: DC | PRN
  Administered 2019-09-08: 6 mg via ORAL
  Filled 2019-09-07 (×2): qty 2

## 2019-09-07 NOTE — Progress Notes (Signed)
Patient is still producing stool with blood/rectal bleeding. The stool is contaminating the purewick suction, so the urine collected appears to be bloody as well.

## 2019-09-07 NOTE — Progress Notes (Signed)
Pt with hypoglycemic labs at 0430, no notes of treating.  Pt is alert and carrying oriented conversation with RN at this time. She is now agreeable to MRI. Will notify MRI of pt availability once CBG is WNL.   Speech therapy arrives at this time to evaluate patient, informed of current situation. Will proceed to evaluate as pt is A/O.  Pt provided one cup of apple juice, to be followed by requested cranberry juice. CBG recheck planned for 10:05.  D5-0.45%NS requested per new infusion order.

## 2019-09-07 NOTE — Progress Notes (Signed)
Patient sleeping during shift report.      

## 2019-09-07 NOTE — Progress Notes (Signed)
PT Cancellation Note  Patient Details Name: Madison Odom MRN: [id number] DOB: [birth year]   Cancelled Treatment:    Reason Eval/Treat Not Completed: Medical issues which prohibited therapy - pt currently on strict bed rest.  PT will initiate eval once activity is upgraded.  Thank you   Herbie Drape 09/07/2019, 1:10 PM

## 2019-09-07 NOTE — Progress Notes (Signed)
Attempted to get pt for MRI, spoke to RN who states pt is refusing at this time.

## 2019-09-07 NOTE — Progress Notes (Addendum)
Subjective: Patient was seen this morning on rounds.  Patient is alert oriented to person place time and event.  Patient denies any new symptoms at this time.  She denies weakness, chest pain, shortness of breath, abdominal pain, problems with bowel or bladder.  Patient states that her appetite depends on the food.  She states that she is somewhat of a picky eater. Patient states that she has not had a bowel movement since she has been admitted.   Objective:  Vital signs in last 24 hours: Vitals:   09/07/19 0004 09/07/19 0637 09/07/19 0835 09/07/19 1055  BP: (!) 111/54 (!) 126/47 (!) 125/49 (!) 123/51  Pulse: 79 72 69 74  Resp: 19 16 (!) 22 (!) 24  Temp: (!) 97.5 F (36.4 C) 98.2 F (36.8 C) 98 F (36.7 C) 97.6 F (36.4 C)  TempSrc: Oral Oral Oral Oral  SpO2: 94% 93% 94% 95%  Weight:  35.4 kg    Height:        Physical Exam: Physical Exam  Constitutional: She is oriented to person, place, and time. No distress.  HENT:  Head: Atraumatic.  Eyes: EOM are normal.  Cardiovascular: Normal rate, regular rhythm, normal heart sounds and intact distal pulses. Exam reveals no gallop and no friction rub.  No murmur heard. Pulmonary/Chest: Effort normal and breath sounds normal. No respiratory distress. She exhibits no tenderness.  Abdominal: Soft. She exhibits no distension. There is no abdominal tenderness.  Genitourinary:    Genitourinary Comments: Blood noted in patients diaper. Difficult to determine if the patient is having rectal bleeding versus vaginal bleeding.    Musculoskeletal:        General: No tenderness or edema.  Neurological: She is alert and oriented to person, place, and time.  Skin: Skin is warm and dry. She is not diaphoretic.    Pertinent labs/Imaging: CBC Latest Ref Rng & Units 09/07/2019 09/06/2019 09/06/2019  WBC 4.0 - 10.5 K/uL 9.2 8.3 -  Hemoglobin 12.0 - 15.0 g/dL 12.9 13.0 12.6  Hematocrit 36.0 - 46.0 % 41.4 39.7 37.0  Platelets 150 - 400 K/uL 236 250  -   CMP Latest Ref Rng & Units 09/07/2019 09/06/2019 09/06/2019  Glucose 70 - 99 mg/dL 50(L) 70 -  BUN 8 - 23 mg/dL 24(H) 23 -  Creatinine 0.44 - 1.00 mg/dL 0.58 0.60 -  Sodium 135 - 145 mmol/L 140 139 138  Potassium 3.5 - 5.1 mmol/L 4.0 3.9 3.8  Chloride 98 - 111 mmol/L 107 106 -  CO2 22 - 32 mmol/L 21(L) 23 -  Calcium 8.9 - 10.3 mg/dL 8.3(L) 8.1(L) -  Total Protein 6.5 - 8.1 g/dL - 5.6(L) -  Total Bilirubin 0.3 - 1.2 mg/dL - 0.8 -  Alkaline Phos 38 - 126 U/L - 115 -  AST 15 - 41 U/L - 18 -  ALT 0 - 44 U/L - 18 -      Assessment/Plan:  Active Problems:   Altered mental status   Hypoglycemia  Patient Summary: 85 year old female with a pertinent past medical history hypertension CVA, atrial fibrillation, anemia, who presented to [facility] with 2 weeks history of acute altered mental status with associated worsening balance and falls, increased sleepiness, and visual hallucinations.  Upon arrival at the hospital the patient did appear ill but has since improved in the last 24 hours.  She no longer appears diaphoretic. she is alert and oriented and able to answer questions appropriately.  #Altered mental status Difficult to determine if the  patient's altered mental status was due to dehydration in the absence of significant lab and imaging results.  Over the patient did decline the MRI stating that it made her back hurt when she previously had one.  We will continue to monitor the patient closely at this time but otherwise she is clinically stable. - Decrease IVF to 100 cc per hour. - PT/OT evaluation and treat for deconditioning - Speech therapy consult   #Hypoglycemia Patient did have one episode of hypoglycemia during rounds this morning.  Patient's CBG was in the 50s repeat serum blood glucose was 45.  Patient was given juice with improvement to the 90s.  D50 was ordered but never given.  Patient was given a diet at this time. - Continue q6 hour glucose checks. - Ordered 7 AM  cortisol tomorrow. - Ensure feeding supplement and start regular diet - Consult dietitian   #Vaginal Bleeding #Rectal bleeding Patient was admitted with bright red blood per rectum.  At this time is difficult to determine if the blood is coming from her rectum or her vagina.  Patient does have a history of hysterectomy and bilateral oophorectomy.  The bleeding was originally attributed to internal hemorrhoids.  Patient has not had any recent imaging/colonoscopy to confirm.  Eliquis was held due to bleeding.   Diet: REgular IVF: NS 100 cc/h VTE: SCDs Code: Full code  Dispo: Anticipated discharge pending clinical improvement .   Dellia Cloud, MD 09/07/2019, 1:14 PM Pager: [phone]

## 2019-09-07 NOTE — Progress Notes (Signed)
PT had agreed to MRI. Once transport arrived, she refused exam. Nurse aware of situation. Suggested to try tomorrow.

## 2019-09-07 NOTE — Progress Notes (Addendum)
Daughter at bedside updated on patient condition/treatment plan/results.  Pt's daughter calls for update, requested visiting family member update him as needed.   Pt is a/o, denies complaints. Passed swallow eval with ST with flying colors.  Has regular diet, and appetite.   Currently has NS @125  ordered/infusing; paused. D5-0/45% has arrived, but requesting confirmation/order eval prior to initiating due to NPO status cancelled and adequate PO intake at this time.   Page/Call to IMTS to notify/request review.

## 2019-09-07 NOTE — Progress Notes (Signed)
OT Cancellation Note  Patient Details Name: TYLENE QUASHIE MRN: [id number] DOB: 1934/08/27   Cancelled Treatment:    Reason Eval/Treat Not Completed: Active bedrest order(OT to evaluate once medically stable and off of bedrest.)   Darryl Nestle) Marsa Aris OTR/L Acute Rehabilitation Services Pager: [phone] Office: [place] 09/07/2019, 12:34 PM

## 2019-09-07 NOTE — Progress Notes (Addendum)
Patient refused MRI. Patient states after last MRI her back hurt for 2 weeks.  MRI aware.  Paged provider.  Provider returned page. Aware of patient refusal. Made provider aware pt requests water. Patient may have a few sips of water as MRI will not be conducted tonight.

## 2019-09-07 NOTE — Evaluation (Signed)
Clinical/Bedside Swallow Evaluation Patient Details  Name: Madison Odom MRN: [id number] Date of Birth: Dec 22, 1933  Today's Date: 09/07/2019 Time: SLP Start Time (ACUTE ONLY): 0957 SLP Stop Time (ACUTE ONLY): 1007 SLP Time Calculation (min) (ACUTE ONLY): 10 min  Past Medical History:  Past Medical History:  Diagnosis Date  . Anemia   . GERD (gastroesophageal reflux disease)   . Hyperlipidemia   . Hypertension   . Hypertensive retinopathy   . MVP (mitral valve prolapse)   . OP (osteoporosis)   . Pelvis fracture ([place]) 09/2015   CLOSED   . Scoliosis   . Spastic colon   . Stroke [facility])    left side neglect   Past Surgical History:  Past Surgical History:  Procedure Laterality Date  . ABDOMINAL HYSTERECTOMY  1965  . APPENDECTOMY    . BILATERAL OOPHORECTOMY  1981  . EYE SURGERY    . LOOP RECORDER INSERTION Left 01/13/2019   Procedure: LOOP RECORDER INSERTION;  Surgeon: Evans Lance, MD;  Location: [place] [facility];  Service: Cardiovascular;  Laterality: Left;  . ROTATOR CUFF REPAIR Right   . TEE WITHOUT CARDIOVERSION N/A 01/13/2019   Procedure: TRANSESOPHAGEAL ECHOCARDIOGRAM (TEE);  Surgeon: Sanda Klein, MD;  Location: [facility] ENDOSCOPY;  Service: Cardiovascular;  Laterality: N/A;  loop   HPI:  Madison Odom is an 85 year old female with a pertinent past medical history of hypertension CVA (February 2020 -with left-sided weakness and hemineglect), and atrial fibrillation who presented to [facility] with a roughly 2-week history of altered mental status. No hx of dysphagia with prior CVA. CT and CXR 10/24 revealed no acute findings.   Assessment / Plan / Recommendation Clinical Impression  Pt presents with functional swallowing as assessed clinically.   Pt tolerated all consistencies trialed, including serial straw sips of thin liquid, with no clinical s/s of aspiration and exhibited good oral clearance of solids.  Recommend regular texture diet with thin liquid.  Pt has no further  ST needs at this time.  SLP will sign off.  Of note, pt stated that she would not be able to complete MRI today 2/2 back pain. SLP Visit Diagnosis: Feeding difficulties (R63.3)    Aspiration Risk  No limitations    Diet Recommendation Regular;Thin liquid   Liquid Administration via: Cup;Straw Medication Administration: Whole meds with liquid Supervision: Patient able to self feed Compensations: Slow rate;Small sips/bites Postural Changes: Seated upright at 90 degrees    Other  Recommendations Oral Care Recommendations: Oral care BID   Follow up Recommendations None      Frequency and Duration (N/A)          Prognosis Prognosis for Safe Diet Advancement: (N/A)      Swallow Study   General Date of Onset: 09/06/19 HPI: Madison Odom is an 85 year old female with a pertinent past medical history of hypertension CVA (February 2020 -with left-sided weakness and hemineglect), and atrial fibrillation who presented to [facility] with a roughly 2-week history of altered mental status. No hx of dysphagia with prior CVA. CT and CXR 10/24 revealed no acute findings. Type of Study: Bedside Swallow Evaluation Previous Swallow Assessment: None Diet Prior to this Study: Regular;Thin liquids Temperature Spikes Noted: No Respiratory Status: Room air History of Recent Intubation: No Behavior/Cognition: Alert;Cooperative;Pleasant mood Oral Cavity Assessment: Within Functional Limits Oral Care Completed by SLP: No Oral Cavity - Dentition: Adequate natural dentition Vision: Functional for self-feeding Self-Feeding Abilities: Able to feed self Patient Positioning: Upright in bed Baseline Vocal Quality: Normal Volitional  Cough: Strong Volitional Swallow: Able to elicit    Oral/Motor/Sensory Function Overall Oral Motor/Sensory Function: Mild impairment Facial ROM: Within Functional Limits Facial Symmetry: Within Functional Limits Lingual ROM: Within Functional Limits Lingual Symmetry: Within  Functional Limits Velum: Within Functional Limits Mandible: Within Functional Limits   Ice Chips Ice chips: Not tested   Thin Liquid Thin Liquid: Within functional limits Presentation: Straw    Nectar Thick Nectar Thick Liquid: Not tested   Honey Thick Honey Thick Liquid: Not tested   Puree Puree: Within functional limits Presentation: Spoon   Solid     Solid: Within functional limits Presentation: Self Fed      Kerrie Pleasure, MA, CCC-SLP Acute Rehabilitation Services Office: [phone]; Pager (10/25): [phone] 09/07/2019,11:15 AM

## 2019-09-08 DIAGNOSIS — Z7901 Long term (current) use of anticoagulants: Secondary | ICD-10-CM

## 2019-09-08 DIAGNOSIS — K625 Hemorrhage of anus and rectum: Secondary | ICD-10-CM

## 2019-09-08 DIAGNOSIS — Z8719 Personal history of other diseases of the digestive system: Secondary | ICD-10-CM

## 2019-09-08 DIAGNOSIS — K5909 Other constipation: Secondary | ICD-10-CM

## 2019-09-08 DIAGNOSIS — E46 Unspecified protein-calorie malnutrition: Secondary | ICD-10-CM

## 2019-09-08 LAB — BASIC METABOLIC PANEL
Anion gap: 7 (ref 5–15)
BUN: 11 mg/dL (ref 8–23)
CO2: 24 mmol/L (ref 22–32)
Calcium: 8.2 mg/dL — ABNORMAL LOW (ref 8.9–10.3)
Chloride: 106 mmol/L (ref 98–111)
Creatinine, Ser: 0.44 mg/dL (ref 0.44–1.00)
GFR calc Af Amer: >60 mL/min (ref >60)
GFR calc non Af Amer: >60 mL/min (ref >60)
Glucose, Bld: 97 mg/dL (ref 70–99)
Potassium: 3.9 mmol/L (ref 3.5–5.1)
Sodium: 137 mmol/L (ref 135–145)

## 2019-09-08 LAB — CBC
HCT: 41.2 % (ref 36.0–46.0)
Hemoglobin: 13.4 g/dL (ref 12.0–15.0)
MCH: 30.7 pg (ref 26.0–34.0)
MCHC: 32.5 g/dL (ref 30.0–36.0)
MCV: 94.3 fL (ref 80.0–100.0)
Platelets: 285 10*3/uL (ref 150–400)
RBC: 4.37 MIL/uL (ref 3.87–5.11)
RDW: 14 % (ref 11.5–15.5)
WBC: 10.3 10*3/uL (ref 4.0–10.5)
nRBC: 0 % (ref 0.0–0.2)

## 2019-09-08 LAB — CORTISOL-AM, BLOOD: Cortisol - AM: 20.7 ug/dL (ref 6.7–22.6)

## 2019-09-08 LAB — GLUCOSE, CAPILLARY
Glucose-Capillary: 90 mg/dL (ref 70–99)
Glucose-Capillary: 107 mg/dL — ABNORMAL HIGH (ref 70–99)

## 2019-09-08 MED ORDER — POLYETHYLENE GLYCOL 3350 17 G PO PACK
17.0000 g | PACK | Freq: Every day | ORAL | Status: DC
  Administered 2019-09-08 – 2019-09-09 (×2): 17 g via ORAL
  Filled 2019-09-08 (×3): qty 1

## 2019-09-08 MED ORDER — ADULT MULTIVITAMIN W/MINERALS CH
1.0000 | ORAL_TABLET | Freq: Every day | ORAL | Status: DC
  Administered 2019-09-08 – 2019-09-10 (×3): 1 via ORAL
  Filled 2019-09-08 (×3): qty 1

## 2019-09-08 MED ORDER — ENSURE ENLIVE PO LIQD
237.0000 mL | Freq: Three times a day (TID) | ORAL | Status: DC
  Administered 2019-09-08 – 2019-09-10 (×5): 237 mL via ORAL

## 2019-09-08 NOTE — Evaluation (Signed)
Physical Therapy Evaluation Patient Details Name: Madison Odom MRN: [id number] DOB: 09-30-1934 Today's Date: 09/08/2019   History of Present Illness  85 year old female with a pertinent past medical history hypertension CVA, atrial fibrillation, anemia, who presented to [facility] with 2 weeks history of acute altered mental status with associated worsening balance and falls, increased sleepiness, and visual hallucinations.  Clinical Impression  Pt demonstrates deficits in gait, functional mobility, strength, power, endurance, balance. It is unclear if these deficits are acute or chronic since CVA in February, may benefit from further family information 2/2 pt confusion. Pt requires significant assistance to perform bed mobility and transfers, and is too weak to safely progress to ambulation on this date. Pt will continued to benefit from PT POC to reduce falls risk and aide in a return to independent mobility.    Follow Up Recommendations SNF    Equipment Recommendations  (defer to post-acute setting)    Recommendations for Other Services       Precautions / Restrictions Precautions Precautions: Fall Restrictions Weight Bearing Restrictions: No      Mobility  Bed Mobility Overal bed mobility: Needs Assistance Bed Mobility: Supine to Sit     Supine to sit: Max assist;+2 for physical assistance;HOB elevated     General bed mobility comments: pt able to initiate LE movement but generally weak, requiring maxA  Transfers Overall transfer level: Needs assistance Equipment used: Rolling walker (2 wheeled) Transfers: Sit to/from [employer] Sit to Stand: Max assist;+2 physical assistance   Squat pivot transfers: Max assist     General transfer comment: squat pivot with L knee block for improved stability  Ambulation/Gait                Stairs            Wheelchair Mobility    Modified Rankin (Stroke Patients Only) Modified Rankin (Stroke  Patients Only) Pre-Morbid Rankin Score: Moderate disability Modified Rankin: Severe disability     Balance                                             Pertinent Vitals/Pain Pain Assessment: Faces Faces Pain Scale: Hurts little more Pain Location: under L arm during transfer Pain Descriptors / Indicators: Aching Pain Intervention(s): Limited activity within patient's tolerance    Home Living Family/patient expects to be discharged to:: Skilled nursing facility                      Prior Function Level of Independence: Needs assistance   Gait / Transfers Assistance Needed: Pt requiring assistance for all bed mobility, transfers, and ambulation recently. Per chart review pt baseline is ambulating with Rollator (modI or supervision)  ADL's / Homemaking Assistance Needed: Requires assistance for most ADLs        Hand Dominance   Dominant Hand: Right    Extremity/Trunk Assessment   Upper Extremity Assessment Upper Extremity Assessment: Defer to OT evaluation    Lower Extremity Assessment Lower Extremity Assessment: Generalized weakness    Cervical / Trunk Assessment Cervical / Trunk Assessment: Kyphotic  Communication   Communication: No difficulties  Cognition Arousal/Alertness: Awake/alert Behavior During Therapy: WFL for tasks assessed/performed Overall Cognitive Status: No family/caregiver present to determine baseline cognitive functioning  General Comments: pt reproting she still lives at home until PT asks if she lives at [place]. Alert and oriented x4      General Comments      Exercises     Assessment/Plan    PT Assessment Patient needs continued PT services  PT Problem List Decreased strength;Decreased range of motion;Decreased activity tolerance;Decreased balance;Decreased mobility;Decreased cognition;Decreased knowledge of use of DME;Decreased safety awareness;Decreased  knowledge of precautions       PT Treatment Interventions DME instruction;Gait training;Functional mobility training;Therapeutic activities;Therapeutic exercise;Balance training;Neuromuscular re-education;Patient/family education    PT Goals (Current goals can be found in the Care Plan section)  Acute Rehab PT Goals Patient Stated Goal: To improve mobility PT Goal Formulation: With patient Time For Goal Achievement: 09/22/19 Potential to Achieve Goals: Fair    Frequency Min 2X/week   Barriers to discharge        Co-evaluation               AM-PAC PT "6 Clicks" Mobility  Outcome Measure Help needed turning from your back to your side while in a flat bed without using bedrails?: A Lot Help needed moving from lying on your back to sitting on the side of a flat bed without using bedrails?: Total Help needed moving to and from a bed to a chair (including a wheelchair)?: Total Help needed standing up from a chair using your arms (e.g., wheelchair or bedside chair)?: Total Help needed to walk in hospital room?: Total Help needed climbing 3-5 steps with a railing? : Total 6 Click Score: 7    End of Session Equipment Utilized During Treatment: Gait belt Activity Tolerance: Patient tolerated treatment well Patient left: in chair;with call bell/phone within reach;with chair alarm set Nurse Communication: Mobility status PT Visit Diagnosis: Muscle weakness (generalized) (M62.81)    Time: 6222-9798 PT Time Calculation (min) (ACUTE ONLY): 23 min   Charges:   PT Evaluation $PT Eval Low Complexity: 1 Low          Arlyss Gandy, PT, DPT Acute Rehabilitation Pager: [phone]   Arlyss Gandy 09/08/2019, 10:30 AM

## 2019-09-08 NOTE — Consult Note (Addendum)
[facility] Consult: 10:36 AM 09/08/2019  LOS: 2 days    Referring Provider: Dr  Mikey Bussing  Primary Care Physician:  Joycelyn Man, NP Primary Gastroenterologist:  unassigned     Reason for Consultation: Painless hematochezia.   HPI: Madison Odom is a 85 y.o. female.  PMH A. fib.  Anemia.  CVA 12/2018.  Chronic Eliquis hypertension.  Anemia, takes oral iron.  Loop recorder in place.  S/p TAH.  S/p appendectomy.  Admitted from SNF 2 days ago with acute encephalopathy.  She has been having increased sleepiness, visual hallucinations, increased frequency of falls, acute altered mental status.  No source for the AMS discovered on labs or imaging.  She was dehydrated.  She has had some hypoglycemia.  Episode of minor to moderate to rectal bleeding once at her nursing home just prior to arrival.  This was in the setting of chronic constipation.  Intake records also says she had been having black and tarry stool for 2 weeks.  Patient is not aware of the black, tarry stools.  She did say that the rectal bleeding was of small amount.  Generally poor appetite with weight loss.  Has not followed through on PMD recommendations for colonoscopy in past years. Eliquis on hold, last dose 10/24.  At [facility] on admission she had fecal impaction, the stool itself was greenish-brown but coated with blood. She has no nausea, no abdominal or rectal pain.  She endorses chronic constipation, generally has a bowel movement every 3 days.  In addition to oral iron her meds include oral calcium, Colace twice daily, Protonix daily.    Yesterday she had hematuria, but this may have been rectal bleeding that had mixed in with the purewick collected urine.  UA from 2 days ago bland, only 0-5 RBCs on microscopic. She is not anemic.  KUB at  admission showed only constipation.  Family history negative for colon cancer, anemia, bleeding ulcers or peptic ulcer disease.    Past Medical History:  Diagnosis Date   Anemia    GERD (gastroesophageal reflux disease)    Hyperlipidemia    Hypertension    Hypertensive retinopathy    MVP (mitral valve prolapse)    OP (osteoporosis)    Pelvis fracture (HCC) 09/2015   CLOSED    Scoliosis    Spastic colon    Stroke (HCC)    left side neglect    Past Surgical History:  Procedure Laterality Date   ABDOMINAL HYSTERECTOMY  1965   APPENDECTOMY     BILATERAL OOPHORECTOMY  1981   EYE SURGERY     LOOP RECORDER INSERTION Left 01/13/2019   Procedure: LOOP RECORDER INSERTION;  Surgeon: Marinus Maw, MD;  Location: [facility]IVE [facility];  Service: Cardiovascular;  Laterality: Left;   ROTATOR CUFF REPAIR Right    TEE WITHOUT CARDIOVERSION N/A 01/13/2019   Procedure: TRANSESOPHAGEAL ECHOCARDIOGRAM (TEE);  Surgeon: Thurmon Fair, MD;  Location: [facility] ENDOSCOPY;  Service: Cardiovascular;  Laterality: N/A;  loop    Prior to Admission medications   Medication Sig Start Date End  Date Taking? Authorizing Provider  acetaminophen (TYLENOL) 325 MG tablet Take 1-2 tablets (325-650 mg total) by mouth every 4 (four) hours as needed for mild pain. Patient taking differently: Take 650 mg by mouth every 4 (four) hours as needed for mild pain.  01/17/19  Yes Love, Evlyn KannerPamela S, PA-C  apixaban (ELIQUIS) 2.5 MG TABS tablet Take 2.5 mg by mouth 2 (two) times daily.   Yes [provider]  buPROPion (WELLBUTRIN) 75 MG tablet Take 37.5 mg by mouth every morning.   Yes [provider]  Calcium Carb-Cholecalciferol (OYSTER CALCIUM/D3) 500-200 MG-UNIT TABS Take 1 tablet by mouth daily.   Yes [provider]  calcium carbonate (TUMS EXTRA STRENGTH 750) 750 MG chewable tablet Chew 2 tablets by mouth as needed (for indigestion).   Yes [provider]  docusate sodium (COLACE)  100 MG capsule Take 100 mg by mouth 2 (two) times daily.   Yes [provider]  Ensure (ENSURE) Take 237 mLs by mouth daily.   Yes [provider]  ferrous sulfate (FERROUSUL) 325 (65 FE) MG tablet Take 325 mg by mouth daily with breakfast.   Yes [provider]  loperamide (IMODIUM A-D) 2 MG tablet Take 2 mg by mouth daily as needed (for diarrhea).   Yes [provider]  Melatonin 10 MG CAPS Take 10 mg by mouth at bedtime.   Yes [provider]  Menthol, Topical Analgesic, (BIOFREEZE) 4 % GEL Apply 1 application topically See admin instructions. Apply to knees and lower back topically every 8 hours as needed for pain   Yes [provider]  Multiple Vitamins-Minerals (CEROVITE SENIOR) TABS Take 1 tablet by mouth daily with breakfast.   Yes [provider]  nadolol (CORGARD) 20 MG tablet Take 1 tablet (20 mg total) by mouth daily. Patient taking differently: Take 20 mg by mouth at bedtime.  02/04/19  Yes Zannie CoveJoseph, Preetha, MD  pantoprazole (PROTONIX) 40 MG tablet TAKE 1 TABLET BY MOUTH EVERY DAY Patient taking differently: Take 40 mg by mouth daily.  02/10/19  Yes Kirsteins, Victorino SparrowAndrew E, MD  simvastatin (ZOCOR) 40 MG tablet TAKE 1 TABLET BY MOUTH EVERY DAY AT 6 PM. Patient taking differently: Take 40 mg by mouth at bedtime.  02/10/19  Yes Kirsteins, Victorino SparrowAndrew E, MD  ferrous sulfate 325 (65 FE) MG tablet Take 1 tablet (325 mg total) by mouth daily at 12 noon. Patient not taking: Reported on 09/06/2019 01/17/19   Love, Evlyn KannerPamela S, PA-C  traZODone (DESYREL) 50 MG tablet Take 50 mg by mouth at bedtime. 03/19/19   [provider]    Scheduled Meds:  feeding supplement (ENSURE ENLIVE)  237 mL Oral BID BM   pantoprazole  40 mg Oral Daily   senna-docusate  1 tablet Oral QHS   simvastatin  40 mg Oral QHS   thiamine  100 mg Oral Daily   Infusions:  sodium chloride 100 mL/hr at 09/07/19 1434   PRN Meds: fluticasone,  Melatonin   Allergies as of 09/06/2019 - Review Complete 09/06/2019  Allergen Reaction Noted   Phenothiazines Anaphylaxis 05/15/2011   Prochlorperazine Anaphylaxis 04/30/2012   Benadryl [diphenhydramine] Other (See Comments) 01/12/2019   Ferrous sulfate Diarrhea and Other (See Comments) 01/17/2019   Penicillins Other (See Comments) 05/24/2011   Tape Other (See Comments) 09/06/2019   Tetracycline Other (See Comments) 01/13/2014    Family History  Problem Relation Age of Onset   Heart attack Mother    Hypertension Mother    Heart disease Father  Hypertension Father    Stroke Neg Hx     Social History   Socioeconomic History   Marital status: Widowed    Spouse name: Not on file   Number of children: Not on file   Years of education: Not on file   Highest education level: Not on file  Occupational History   Not on file  Social Needs   Financial resource strain: Not on file   Food insecurity    Worry: Not on file    Inability: Not on file   Transportation needs    Medical: Not on file    Non-medical: Not on file  Tobacco Use   Smoking status: Never Smoker   Smokeless tobacco: Never Used  Substance and Sexual Activity   Alcohol use: No   Drug use: No   Sexual activity: Not on file  Lifestyle   Physical activity    Days per week: Not on file    Minutes per session: Not on file   Stress: Not on file  Relationships   Social connections    Talks on phone: Not on file    Gets together: Not on file    Attends religious service: Not on file    Active member of club or organization: Not on file    Attends meetings of clubs or organizations: Not on file    Relationship status: Not on file   Intimate partner violence    Fear of current or ex partner: Not on file    Emotionally abused: Not on file    Physically abused: Not on file    Forced sexual activity: Not on file  Other Topics Concern   Not on file  Social History Narrative   1  year of college    Widow   Two daughters     REVIEW OF SYSTEMS: Constitutional: Weakness, recent falls. ENT:  No nose bleeds Pulm: Cough productive of clear sputum.  No dyspnea. CV:  No palpitations, no LE edema.  No chest pain. GU:  No hematuria, no frequency GI: See HPI.  Denies dysphagia, rare heartburn. Heme: Other than the recent rectal bleeding and hematuria, denies excessive bleeding.  Does bruise easily. Transfusions: Denies prior blood transfusions. Neuro:  No headaches, no peripheral tingling or numbness.  No syncope.  No seizures. Derm:  No itching, no rash or sores.  Endocrine:  No sweats or chills.  No polyuria or dysuria Immunization: Reviewed Travel:  None beyond local counties in last few months.    PHYSICAL EXAM: Vital signs in last 24 hours: Vitals:   09/08/19 0637 09/08/19 0800  BP: 133/66 (!) 143/67  Pulse: 73 72  Resp: 20   Temp: 98 F (36.7 C) 98.6 F (37 C)  SpO2: 93% 94%   Wt Readings from Last 3 Encounters:  09/08/19 45.4 kg  01/13/19 35.4 kg  01/31/18 36.2 kg    General: Frail, aged WF.  Leaning hard to the left in the bedside chair. Head: No facial asymmetry or swelling.  No signs of head trauma. Eyes: No scleral icterus.  No conjunctival pallor. Ears: Not hard of hearing Nose: No congestion, no discharge. Mouth: Oral mucosa moist, pink, clear. Neck: No JVD, no thyromegaly. Lungs: CTA bilaterally.  Reduced breath sounds on the right base. Heart: RRR.  No MRG.  S1, S2 present.  Heart sounds distant. Abdomen: Soft.  Active bowel sounds.  No organomegaly, masses, bruits, hernias.  Not tender or distended..   Rectal: Deferred.  Plan to  do this once patient is back in the bed. Musc/Skeltl: Osteoporotic appearing.  Kyphotic. Extremities: No CCE. Neurologic: Oriented x3.  Alert.  Appropriate. Skin: Purpura on the arms. Nodes: No cervical adenopathy Psych: Cooperative, pleasant, calm.  Intake/Output from previous day: 10/25 0701 - 10/26  0700 In: 2198 [P.O.:420; I.V.:1778] Out: 352 [Urine:351; Emesis/NG output:1] Intake/Output this shift: No intake/output data recorded.  LAB RESULTS: Recent Labs    09/06/19 2128 09/07/19 0423 09/08/19 0656  WBC 8.3 9.2 10.3  HGB 13.0 12.9 13.4  HCT 39.7 41.4 41.2  PLT 250 236 285   BMET Lab Results  Component Value Date   NA 137 09/08/2019   NA 140 09/07/2019   NA 139 09/06/2019   K 3.9 09/08/2019   K 4.0 09/07/2019   K 3.9 09/06/2019   CL 106 09/08/2019   CL 107 09/07/2019   CL 106 09/06/2019   CO2 24 09/08/2019   CO2 21 (L) 09/07/2019   CO2 23 09/06/2019   GLUCOSE 97 09/08/2019   GLUCOSE 50 (L) 09/07/2019   GLUCOSE 70 09/06/2019   BUN 11 09/08/2019   BUN 24 (H) 09/07/2019   BUN 23 09/06/2019   CREATININE 0.44 09/08/2019   CREATININE 0.58 09/07/2019   CREATININE 0.60 09/06/2019   CALCIUM 8.2 (L) 09/08/2019   CALCIUM 8.3 (L) 09/07/2019   CALCIUM 8.1 (L) 09/06/2019   LFT Recent Labs    09/06/19 1151 09/06/19 2129  PROT 6.2* 5.6*  ALBUMIN 2.6* 2.3*  AST 24 18  ALT 21 18  ALKPHOS 128* 115  BILITOT 0.7 0.8  BILIDIR 0.1  --   IBILI 0.6  --    PT/INR Lab Results  Component Value Date   INR 1.3 (H) 09/06/2019   Hepatitis Panel No results for input(s): HEPBSAG, HCVAB, HEPAIGM, HEPBIGM in the last 72 hours. C-Diff No components found for: CDIFF Lipase     Component Value Date/Time   LIPASE 26 09/06/2019 1151    Drugs of Abuse  No results found for: LABOPIA, COCAINSCRNUR, LABBENZ, AMPHETMU, THCU, LABBARB   RADIOLOGY STUDIES: Dg Abd 1 View  Result Date: 09/06/2019 CLINICAL DATA:  85 year old female with bleeding. EXAM: ABDOMEN - 1 VIEW COMPARISON:  Pelvic radiograph dated 07/21/2019 FINDINGS: There is a large amount of stool throughout the colon. No bowel dilatation or evidence of obstruction. No free air. There is osteopenia with scoliosis and degenerative changes of the spine. No acute osseous pathology. IMPRESSION: Constipation. No bowel  obstruction. Electronically Signed   By: Elgie Collard M.D.   On: 09/06/2019 20:54   Ct Head Wo Contrast  Result Date: 09/06/2019 CLINICAL DATA:  Head pain, recent falls EXAM: CT HEAD WITHOUT CONTRAST CT CERVICAL SPINE WITHOUT CONTRAST TECHNIQUE: Multidetector CT imaging of the head and cervical spine was performed following the standard protocol without intravenous contrast. Multiplanar CT image reconstructions of the cervical spine were also generated. COMPARISON:  None. FINDINGS: CT HEAD FINDINGS Brain: No evidence of acute infarction, hemorrhage, hydrocephalus, extra-axial collection or mass lesion/mass effect. Encephalomalacia involving the middle cerebral artery distribution reflects prior infarct. There is mild cerebral volume loss with associated ex vacuo dilatation. Periventricular white matter hypoattenuation likely represents chronic small vessel ischemic disease. Vascular: There are vascular calcifications in the carotid siphons. Skull: Normal. Negative for fracture or focal lesion. Sinuses/Orbits: Left sphenoid and right maxillary sinus disease is noted. Other: None. CT CERVICAL SPINE FINDINGS Alignment: There is 3 mm anterolisthesis of C4 on C5, unchanged. The patient's head is turned to the side. Skull base  and vertebrae: No acute fracture. No primary bone lesion or focal pathologic process. Soft tissues and spinal canal: No prevertebral fluid or swelling. No visible canal hematoma. Disc levels: Varying degrees of multilevel degenerative disc and joint disease are seen in the cervical spine. Upper chest: Pleuroparenchymal scarring is redemonstrated. Other: None IMPRESSION: 1. No acute intracranial process. 2. No acute osseous injury in the cervical spine. Electronically Signed   By: Romona Curls M.D.   On: 09/06/2019 14:20   Ct Cervical Spine Wo Contrast  Result Date: 09/06/2019 CLINICAL DATA:  Head pain, recent falls EXAM: CT HEAD WITHOUT CONTRAST CT CERVICAL SPINE WITHOUT CONTRAST  TECHNIQUE: Multidetector CT imaging of the head and cervical spine was performed following the standard protocol without intravenous contrast. Multiplanar CT image reconstructions of the cervical spine were also generated. COMPARISON:  None. FINDINGS: CT HEAD FINDINGS Brain: No evidence of acute infarction, hemorrhage, hydrocephalus, extra-axial collection or mass lesion/mass effect. Encephalomalacia involving the middle cerebral artery distribution reflects prior infarct. There is mild cerebral volume loss with associated ex vacuo dilatation. Periventricular white matter hypoattenuation likely represents chronic small vessel ischemic disease. Vascular: There are vascular calcifications in the carotid siphons. Skull: Normal. Negative for fracture or focal lesion. Sinuses/Orbits: Left sphenoid and right maxillary sinus disease is noted. Other: None. CT CERVICAL SPINE FINDINGS Alignment: There is 3 mm anterolisthesis of C4 on C5, unchanged. The patient's head is turned to the side. Skull base and vertebrae: No acute fracture. No primary bone lesion or focal pathologic process. Soft tissues and spinal canal: No prevertebral fluid or swelling. No visible canal hematoma. Disc levels: Varying degrees of multilevel degenerative disc and joint disease are seen in the cervical spine. Upper chest: Pleuroparenchymal scarring is redemonstrated. Other: None IMPRESSION: 1. No acute intracranial process. 2. No acute osseous injury in the cervical spine. Electronically Signed   By: Romona Curls M.D.   On: 09/06/2019 14:20   Dg Chest Port 1 View  Result Date: 09/06/2019 CLINICAL DATA:  Status post fall EXAM: PORTABLE CHEST 1 VIEW COMPARISON:  02/03/2019 FINDINGS: Patient is rotated towards the left. No definite focal consolidation, pleural effusion or pneumothorax. Stable cardiomediastinal silhouette. No acute osseous abnormality. 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 Generalized osteopenia. IMPRESSION: No  acute cardiopulmonary disease. Electronically Signed   By: Elige Ko   On: 09/06/2019 12:54     IMPRESSION:   *    Painless rectal bleeding a/w with fecal impaction.  Moderate constipation. Her calcium and iron are not helping the constipation problem.  Stool softeners not adequate to deal with the constipation. Not anemic, actually quite surprising given her age and comorbidities.  She does take iron every day, MCV normal. No previous colonoscopy per patient wishes.  Currently looks a bit frail to undergo colonoscopy.  *    AMS, resolved.  Etiology unclear.  No signs of active urinary or pulmonary infections, head CT negative  *   Chronic Eliquis, on hold.  Hx CVA and atrial fibrillation.  Currently in NSR   PLAN:     *    In addition to the Senokot she is getting at night, add MiraLAX once daily to start, titrate this with goal of bowel movement at least once a day.  *   ?  Pursue CT abdomen pelvis w contrast in order to rule out any major gastrointestinal issues.  Question of colonoscopy, though she looks frail enough that this may be best avoided. Dr. Meridee Score will be seeing the patient  and rounding make any formal decisions as to further diagnostic work-up  *   ? need for ongoing Eliquis  *    Advanced her from n.p.o. to regular diet  Azucena Freed  09/08/2019, 10:36 AM Phone [phone]

## 2019-09-08 NOTE — Evaluation (Signed)
Occupational Therapy Evaluation Patient Details Name: Madison Odom MRN: [id number] DOB: 1934-06-23 Today's Date: 09/08/2019    History of Present Illness 85 year old female with a pertinent past medical history hypertension CVA, atrial fibrillation, anemia, who presented to [facility] with 2 weeks history of acute altered mental status with associated worsening balance and falls, increased sleepiness, and visual hallucinations.   Clinical Impression   Patient admitted for above and limited by problem list below, including impaired balance, generalized weakness, decreased functional ROM of BUEs, and decreased activity tolerance.  Pt oriented and alert, initially reporting living at home alone with caregivers but then reports needing increased assist since CVA in February as has been at [facility].  She currently requires mod-max assist for UB ADLs, total assist for LB ADLs and max assist +2 for sit to stand/transfers.  She will benefit from continued OT services while admitted and after dc at SNF level in order to decrease burden of care with ADLs, mobility.     Follow Up Recommendations  SNF;Supervision/Assistance - 24 hour    Equipment Recommendations  3 in 1 bedside commode    Recommendations for Other Services       Precautions / Restrictions Precautions Precautions: Fall Restrictions Weight Bearing Restrictions: No      Mobility Bed Mobility Overal bed mobility: Needs Assistance Bed Mobility: Supine to Sit     Supine to sit: Max assist;+2 for physical assistance;HOB elevated     General bed mobility comments: pt able to initiate LE movement but generally weak, requiring maxA for trunk, LB movement and scooting forward  Transfers Overall transfer level: Needs assistance Equipment used: Rolling walker (2 wheeled) Transfers: Sit to/from [employer] Sit to Stand: Max assist;+2 physical assistance   Squat pivot transfers: Max assist      General transfer comment: +2 max asssit to ascend with posterior lean, squat pivot with max assist     Balance Overall balance assessment: Needs assistance Sitting-balance support: No upper extremity supported;Feet supported Sitting balance-Leahy Scale: Fair Sitting balance - Comments: min guard for safety   Standing balance support: Bilateral upper extremity supported;During functional activity Standing balance-Leahy Scale: Poor Standing balance comment: relaint on BUE and external support                           ADL either performed or assessed with clinical judgement   ADL Overall ADL's : Needs assistance/impaired     Grooming: Maximal assistance;Sitting Grooming Details (indicate cue type and reason): support to bring hand to face washing  Upper Body Bathing: Sitting;Maximal assistance   Lower Body Bathing: Total assistance;+2 for physical assistance;Sitting/lateral leans   Upper Body Dressing : Maximal assistance;Sitting   Lower Body Dressing: +2 for physical assistance;+2 for safety/equipment;Sit to/from stand;Total assistance   Toilet Transfer: Total assistance;Squat-pivot           Functional mobility during ADLs: Maximal assistance;+2 for physical assistance;+2 for safety/equipment General ADL Comments: pt limited by cognition, weakness, impaired balance      Vision         Perception     Praxis      Pertinent Vitals/Pain Pain Assessment: Faces Faces Pain Scale: Hurts little more Pain Location: under L arm during transfer Pain Descriptors / Indicators: Aching Pain Intervention(s): Limited activity within patient's tolerance;Monitored during session;Repositioned     Hand Dominance Right   Extremity/Trunk Assessment Upper Extremity Assessment Upper Extremity Assessment: RUE deficits/detail;LUE deficits/detail RUE Deficits / Details:  grossly 3-/5, limited shoulder AROM noted; decreased functional use to bring hand to mouth due to  shoulder weakness  LUE Deficits / Details: grossly 3-/5, limited shoulder AROM noted; decreased functional use to bring hand to mouth due to shoulder weakness    Lower Extremity Assessment Lower Extremity Assessment: Defer to PT evaluation   Cervical / Trunk Assessment Cervical / Trunk Assessment: Kyphotic   Communication Communication Communication: No difficulties   Cognition Arousal/Alertness: Awake/alert Behavior During Therapy: WFL for tasks assessed/performed Overall Cognitive Status: Impaired/Different from baseline Area of Impairment: Memory;Safety/judgement;Awareness;Problem solving;Following commands                     Memory: Decreased short-term memory Following Commands: Follows one step commands consistently;Follows one step commands with increased time;Follows multi-step commands inconsistently Safety/Judgement: Decreased awareness of safety;Decreased awareness of deficits Awareness: Emergent Problem Solving: Slow processing;Decreased initiation;Difficulty sequencing;Requires verbal cues;Requires tactile cues General Comments: pt with mild confusion, oriented x 4 but reports still living at home until questioned about recent stay at [facility]   General Comments       Exercises     Shoulder Instructions      Home Living Family/patient expects to be discharged to:: Skilled nursing facility                                        Prior Functioning/Environment Level of Independence: Needs assistance  Gait / Transfers Assistance Needed: Pt requiring assistance for all bed mobility, transfers, and ambulation recently. Per chart review pt baseline is ambulating with Rollator (modI or supervision) ADL's / Homemaking Assistance Needed: Requires assistance for most ADLs   Comments: pt poor historian, initally reports independent but able to report increased assist recently         OT Problem List: Decreased activity tolerance;Impaired  balance (sitting and/or standing);Decreased safety awareness;Decreased cognition;Decreased coordination;Decreased range of motion;Decreased strength;Decreased knowledge of use of DME or AE;Decreased knowledge of precautions;Impaired UE functional use      OT Treatment/Interventions: Self-care/ADL training;Therapeutic exercise;DME and/or AE instruction;Therapeutic activities;Cognitive remediation/compensation;Patient/family education;Balance training    OT Goals(Current goals can be found in the care plan section) Acute Rehab OT Goals Patient Stated Goal: To improve mobility OT Goal Formulation: With patient Time For Goal Achievement: 09/22/19 Potential to Achieve Goals: Good  OT Frequency: Min 2X/week   Barriers to D/C:            Co-evaluation PT/OT/SLP Co-Evaluation/Treatment: Yes Reason for Co-Treatment: To address functional/ADL transfers;For patient/therapist safety   OT goals addressed during session: ADL's and self-care      AM-PAC OT "6 Clicks" Daily Activity     Outcome Measure Help from another person eating meals?: A Lot Help from another person taking care of personal grooming?: A Lot Help from another person toileting, which includes using toliet, bedpan, or urinal?: Total Help from another person bathing (including washing, rinsing, drying)?: A Lot Help from another person to put on and taking off regular upper body clothing?: A Lot Help from another person to put on and taking off regular lower body clothing?: Total 6 Click Score: 10   End of Session Equipment Utilized During Treatment: Gait belt;Rolling walker Nurse Communication: Mobility status  Activity Tolerance: Patient tolerated treatment well Patient left: in chair;with call bell/phone within reach;with chair alarm set  OT Visit Diagnosis: Other abnormalities of gait and mobility (R26.89);Muscle weakness (generalized) (M62.81);Other symptoms and signs involving  cognitive function                Time:  0947-1010 OT Time Calculation (min): 23 min Charges:  OT General Charges $OT Visit: 1 Visit OT Evaluation $OT Eval Moderate Complexity: 1 Mod  Chancy Milroy, OT Acute Rehabilitation Services Pager [phone] Office [phone]    Chancy Milroy 09/08/2019, 1:23 PM

## 2019-09-08 NOTE — Progress Notes (Signed)
Initial Nutrition Assessment  DOCUMENTATION CODES:   Severe malnutrition in context of chronic illness  INTERVENTION:   -Continue Ensure Enlive po TID, each supplement provides 350 kcal and 20 grams of protein -MVI with minerals daily -Magic cup TID with meals, each supplement provides 290 kcal and 9 grams of protein -Feeding assistance with meals  NUTRITION DIAGNOSIS:   Severe Malnutrition related to chronic illness(CVA) as evidenced by energy intake < or equal to 75% for > or equal to 1 month, moderate fat depletion, severe fat depletion, moderate muscle depletion, severe muscle depletion.  GOAL:   Patient will meet greater than or equal to 90% of their needs  MONITOR:   PO intake, Supplement acceptance, Weight trends, Skin, Labs, I & O's  REASON FOR ASSESSMENT:   Consult Assessment of nutrition requirement/status  ASSESSMENT:   Madison Odom is an 85 year old female with a pertinent past medical history of hypertension CVA (February 2020 -with left-sided weakness and hemineglect), and atrial fibrillation who presented to [facility] with a roughly 2-week history of altered mental status.  Jorde of the history was provided by the patient's daughter who is at bedside.  [name] states that the patient was in her normal state of health until February when she had her acute CVA.  Patient was noted to have increased number of falls since then and subsequently required assisted living placement at [facility]  Within the last 2 weeks the patient was noted to become increasingly clumsy having multiple falls.  During this time she also became more lethargic sleeping more, disoriented, with decreased appetite.  Patient was noted to have several episodes of visual hallucinations.  [name] states that this is very uncommon for her.  She is usually well oriented to person place time and events and can usually manage the majority of her ADLs without additional assistance.  Due  to recent Covid restrictions at her assisted living facility the patient started was unable to comment on whether or not the patient has had acute changes to her health otherwise, but states that she is often incontinent of urine.  She is unaware if the patient has had increased shortness of breath or cough lately.  Patient's recent admission was precipitated by recent bright red blood per rectum.  Patient has been on Eliquis for her previous CVA as well as atrial fibrillation.  Patient did have a urinalysis performed at her facility.  I called her facility but they have not received the results as of yet.  Pt admitted with AMS.   Reviewed I/O's: +1.8 L x 24 hours and +3.5 L since admission  UOP: 351 ml x 24 hours  Spoke with pt and daughter Marisue Ivan) in room. Pt pleasant, sitting in recliner chair and sipping Ensure at time of visit; she deferred most of the conversation to [place]. Per pt daughter, she has had a poor appetite "for a long time" and has also experienced weight loss. They are unsure of further details. Pt and daughter explain that pt has always been small framed and petite; pt estimates her UBW is around 100#. She reports she gained weight in the hospital "because of all of those blankets they put on me".   Pt daughter reports that she has been assisting pt with her meals and requires feeding assistance. Observed breakfast and lunch tray- pt consumed less than 25% of both meals (noted meal completion 10-50%). Pt daughter shares that pt also drinks Ensure well with encouragement (pt had consumed about 755 of chocolate  Ensure prior to RD visit). Discussed importance of good meal and supplement intake to promote healing. Pt daughter also interested in [employer].   Labs reviewed: CBGS: [id number] (inpatient orders for glycemic control are none).   NUTRITION - FOCUSED PHYSICAL EXAM:    Most Recent Value  Orbital Region  Severe depletion  Upper Arm Region  Severe depletion  Thoracic and Lumbar  Region  Moderate depletion  Buccal Region  Moderate depletion  Temple Region  Severe depletion  Clavicle Bone Region  Severe depletion  Clavicle and Acromion Bone Region  Severe depletion  Scapular Bone Region  Severe depletion  Dorsal Hand  Severe depletion  Patellar Region  Moderate depletion  Anterior Thigh Region  Moderate depletion  Posterior Calf Region  Moderate depletion  Edema (RD Assessment)  Mild  Hair  Reviewed  Eyes  Reviewed  Mouth  Reviewed  Skin  Reviewed  Nails  Reviewed       Diet Order:   Diet Order            Diet regular Room service appropriate? Yes; Fluid consistency: Thin  Diet effective now              EDUCATION NEEDS:   Education needs have been addressed  Skin:  Skin Assessment: Reviewed RN Assessment  Last BM:  09/07/19  Height:   Ht Readings from Last 1 Encounters:  09/06/19 4\' 10"  (1.473 m)    Weight:   Wt Readings from Last 1 Encounters:  09/08/19 45.4 kg    Ideal Body Weight:  44.1 kg  BMI:  Body mass index is 20.9 kg/m.  Estimated Nutritional Needs:   Kcal:  1150-1350  Protein:  55-70 grams  Fluid:  > 1.1 L    Dillyn Joaquin A. Jimmye Norman, RD, LDN, [place] Registered Dietitian II Certified Diabetes Care and Education Specialist Pager: [phone] After hours Pager: [phone]

## 2019-09-08 NOTE — Progress Notes (Addendum)
  Date: 09/08/2019  Patient name: Madison Odom  Medical record number: [id number]  Date of birth: August 03, 1934        I have seen and evaluated this patient and I have discussed the plan of care with the house staff. Please see their note for complete details. I concur with their findings.  Patient here with AMS likely 2/2 to dehydration and hypoglycemia. Improved with IVFs. Adrenal insufficiency has been ruled out with AM cortisol of 20. She appears chronically malnourished but her weight has remained stable (80 lbs in 2012, 78 lbs this admission). She did have a right MCA territory infarct in February felt to be embolic from atrial fibrillation (later seen on implanted loop recorder) and has since been on Eliquis. Since her stroke she has had a progressive decline in her functional status and recurrent falls and has been residing at [facility] facility.  Over the past two weeks she as noted to have a decline in her mental status as well as visual hallucinations. At baseline she is normally oriented x 3 and can manage her ADLs without assistance. She was noted to have BRBPR and sent to the ED for evaluation.   On evaluation today, she is alert and oriented and appears back to her baseline mental status. She has had no further episodes of hypoglycemia. She continues to have a small / slow bleed from her rectum. Hemoglobin has remained stable. Her Eliquis is on hold. Unclear if she is a candidate for endoscopy but appreciate GI input. She reports a history of hemorrhoids but denies ever having a colonoscopy. She has chronic constipation requiring DRE and disimpaction on admission. GI has added Miralax to her qhs senokot. Since the bleed is mild I suspect this is due to her constipation and hemorrhoids.   She is high risk for recurrent stroke given her recent infarcts (< 1 year ago). If no plans for endoscopy, will resume Eliquis and monitor for worsening bleed.   Velna Ochs, MD 09/08/2019,  11:38 AM

## 2019-09-08 NOTE — Progress Notes (Signed)
Change patient still bleeding a lot,patient denies pain,shortness of breath, no dizziness. Will continue to monitor the patient.

## 2019-09-08 NOTE — Progress Notes (Signed)
Subjective: Patient was seen this morning on rounds.  She states that she is feeling well.  Denies any new symptoms at this time.  She is alert and oriented.  She did have a bowel movement this morning that had some blood in it.  Otherwise she denies abdominal pain, she denies problems urinating or problems having bowel movements. Overnight: patient tried to have a bowel movement and had roughly 15 cc of blood. She was seemingly anxious. Nurse reports that the patient has been in normal sinus since admission.  Objective:  Vital signs in last 24 hours: Vitals:   09/08/19 0120 09/08/19 0636 09/08/19 0637 09/08/19 0800  BP: (!) 143/74  133/66 (!) 143/67  Pulse: 80  73 72  Resp: 16  20   Temp: 98 F (36.7 C)  98 F (36.7 C) 98.6 F (37 C)  TempSrc: Oral  Oral Oral  SpO2: 92%  93% 94%  Weight:  45.4 kg    Height:         Physical Exam: Physical Exam  Constitutional: She is oriented to person, place, and time. No distress.  HENT:  Head: Atraumatic.  Eyes: EOM are normal.  Cardiovascular: Normal rate, regular rhythm, normal heart sounds and intact distal pulses. Exam reveals no gallop and no friction rub.  No murmur heard. She has been in sinus rhythm since being admitted   Pulmonary/Chest: Effort normal.  Abdominal: Soft. She exhibits no distension and no mass. Bowel sounds are hypoactive. There is no abdominal tenderness. There is no guarding.  Musculoskeletal:        General: No tenderness or edema.  Neurological: She is alert and oriented to person, place, and time.  Skin: Skin is warm and dry. She is not diaphoretic.     Pertinent labs/Imaging: CBC Latest Ref Rng & Units 09/08/2019 09/07/2019 09/06/2019  WBC 4.0 - 10.5 K/uL 10.3 9.2 8.3  Hemoglobin 12.0 - 15.0 g/dL 30.8 65.7 84.6  Hematocrit 36.0 - 46.0 % 41.2 41.4 39.7  Platelets 150 - 400 K/uL 285 236 250   CMP Latest Ref Rng & Units 09/08/2019 09/07/2019 09/06/2019  Glucose 70 - 99 mg/dL 97 96(E) 70  BUN 8 - 23  mg/dL 11 95(M) 23  Creatinine 0.44 - 1.00 mg/dL 8.41 3.24 4.01  Sodium 135 - 145 mmol/L 137 140 139  Potassium 3.5 - 5.1 mmol/L 3.9 4.0 3.9  Chloride 98 - 111 mmol/L 106 107 106  CO2 22 - 32 mmol/L 24 21(L) 23  Calcium 8.9 - 10.3 mg/dL 8.2(L) 8.3(L) 8.1(L)  Total Protein 6.5 - 8.1 g/dL - - 5.6(L)  Total Bilirubin 0.3 - 1.2 mg/dL - - 0.8  Alkaline Phos 38 - 126 U/L - - 115  AST 15 - 41 U/L - - 18  ALT 0 - 44 U/L - - 18   AM Cortisol: 20.7 CXR: Constipation. No bowel obstruction.  Assessment/Plan:  Active Problems:   Altered mental status   Hypoglycemia   Rectal bleeding    Patient Summary: 85 year old female with a pertinent past medical history hypertension CVA, atrial fibrillation, anemia, who presented to [facility] with 2 weeks history of acute altered mental status with associated worsening balance and falls, increased sleepiness, and visual hallucinations.  Upon arrival at the hospital the patient did appear ill but has since improved in the last 24 hours with IVF.  She continues to have blood in her diaper concerning for lower GI bleed possibly due to internal hemorrhoids in the setting of constipation.  Patient is off of anticoagulation due to GI bleed.   #Altered mental status Patient is more alert and oriented today.  Patient's change in mental status seemed to resolve after 24 hours of IV fluids. - AM cortisol was negative for adrenal insufficiency.  #Hypoglycemia  - Am cortisol negative - Ensure nutrition supplementation. - Dietitian consulted yesterday.  #Rectal bleeding - Started on bowel regimen yesterday - had BM minimal blood - GI consult for lower GI bleed. -If patient does not want a colonoscopy at this time then we will need to restart her anticoagulation and monitor her GI bleed.  #Atrial Fibrillation - Patient has been in sinus rhythm since admission - Continue to hold Eliquis until GI consult   Diet: Regular IVF: NS 100 cc/hr VTE: SCDs Code:  Full code  Dispo: Anticipated discharge pending clinical improvement.  Marianna Payment, MD 09/08/2019, 11:09 AM Pager: [phone]

## 2019-09-09 ENCOUNTER — Inpatient Hospital Stay (HOSPITAL_COMMUNITY): Payer: Medicare Other

## 2019-09-09 DIAGNOSIS — K649 Unspecified hemorrhoids: Secondary | ICD-10-CM

## 2019-09-09 DIAGNOSIS — Z66 Do not resuscitate: Secondary | ICD-10-CM

## 2019-09-09 DIAGNOSIS — R58 Hemorrhage, not elsewhere classified: Secondary | ICD-10-CM

## 2019-09-09 DIAGNOSIS — E43 Unspecified severe protein-calorie malnutrition: Secondary | ICD-10-CM | POA: Insufficient documentation

## 2019-09-09 LAB — CBC
HCT: 37 % (ref 36.0–46.0)
HCT: 43.7 % (ref 36.0–46.0)
Hemoglobin: 11.7 g/dL — ABNORMAL LOW (ref 12.0–15.0)
Hemoglobin: 13.8 g/dL (ref 12.0–15.0)
MCH: 30.2 pg (ref 26.0–34.0)
MCH: 30.3 pg (ref 26.0–34.0)
MCHC: 31.6 g/dL (ref 30.0–36.0)
MCHC: 31.6 g/dL (ref 30.0–36.0)
MCV: 95.6 fL (ref 80.0–100.0)
MCV: 96 fL (ref 80.0–100.0)
Platelets: 288 10*3/uL (ref 150–400)
Platelets: 390 10*3/uL (ref 150–400)
RBC: 3.87 MIL/uL (ref 3.87–5.11)
RBC: 4.55 MIL/uL (ref 3.87–5.11)
RDW: 14.1 % (ref 11.5–15.5)
RDW: 14.1 % (ref 11.5–15.5)
WBC: 7.9 10*3/uL (ref 4.0–10.5)
WBC: 9.9 10*3/uL (ref 4.0–10.5)
nRBC: 0 % (ref 0.0–0.2)
nRBC: 0 % (ref 0.0–0.2)

## 2019-09-09 LAB — GLUCOSE, CAPILLARY
Glucose-Capillary: 133 mg/dL — ABNORMAL HIGH (ref 70–99)
Glucose-Capillary: 86 mg/dL (ref 70–99)
Glucose-Capillary: 110 mg/dL — ABNORMAL HIGH (ref 70–99)
Glucose-Capillary: 136 mg/dL — ABNORMAL HIGH (ref 70–99)

## 2019-09-09 LAB — BASIC METABOLIC PANEL
Anion gap: 9 (ref 5–15)
BUN: 7 mg/dL — ABNORMAL LOW (ref 8–23)
CO2: 23 mmol/L (ref 22–32)
Calcium: 8 mg/dL — ABNORMAL LOW (ref 8.9–10.3)
Chloride: 106 mmol/L (ref 98–111)
Creatinine, Ser: 0.45 mg/dL (ref 0.44–1.00)
GFR calc Af Amer: >60 mL/min (ref >60)
GFR calc non Af Amer: >60 mL/min (ref >60)
Glucose, Bld: 86 mg/dL (ref 70–99)
Potassium: 3.3 mmol/L — ABNORMAL LOW (ref 3.5–5.1)
Sodium: 138 mmol/L (ref 135–145)

## 2019-09-09 LAB — CULTURE, BLOOD (ROUTINE X 2)

## 2019-09-09 MED ORDER — POTASSIUM CHLORIDE CRYS ER 20 MEQ PO TBCR
20.0000 meq | EXTENDED_RELEASE_TABLET | Freq: Two times a day (BID) | ORAL | Status: DC
  Administered 2019-09-09 – 2019-09-10 (×3): 20 meq via ORAL
  Filled 2019-09-09 (×3): qty 1

## 2019-09-09 MED ORDER — BISACODYL 10 MG RE SUPP
10.0000 mg | RECTAL | Status: DC
  Administered 2019-09-09: 10 mg via RECTAL
  Filled 2019-09-09: qty 1

## 2019-09-09 MED ORDER — DOCUSATE SODIUM 100 MG PO CAPS
100.0000 mg | ORAL_CAPSULE | Freq: Every day | ORAL | Status: DC
  Administered 2019-09-09: 100 mg via ORAL
  Filled 2019-09-09 (×2): qty 1

## 2019-09-09 MED ORDER — WITCH HAZEL-GLYCERIN EX PADS
1.0000 "application " | MEDICATED_PAD | Freq: Two times a day (BID) | CUTANEOUS | Status: DC | PRN
  Filled 2019-09-09: qty 100

## 2019-09-09 MED ORDER — APIXABAN 2.5 MG PO TABS
2.5000 mg | ORAL_TABLET | Freq: Two times a day (BID) | ORAL | Status: DC
  Administered 2019-09-09 – 2019-09-10 (×3): 2.5 mg via ORAL
  Filled 2019-09-09 (×3): qty 1

## 2019-09-09 MED ORDER — IOHEXOL 350 MG/ML SOLN
100.0000 mL | Freq: Once | INTRAVENOUS | Status: AC | PRN
  Administered 2019-09-09: 100 mL via INTRAVENOUS

## 2019-09-09 NOTE — TOC Initial Note (Signed)
Transition of Care [facility]) - Initial/Assessment Note    Patient Details  Name: Madison Odom MRN: [id number] Date of Birth: 1934/07/02  Transition of Care [facility]) CM/SW Contact:    Leone Haven, RN Phone Number: 09/09/2019, 2:30 PM  Clinical Narrative:                 [facility] [address] , per pt eval rec SNF, NCM spoke with patient, she agrees to SNF and states she would like to go to [facility], she  Has been there before.  NCM asked if could fax her information out she states yes.  She states her daughter is Donalynn Furlong and NCM can contact her also.  NCM contacted [facility] and they state they can take patient when authorization is ready.  NCM started auth with [facility] , awaiting auth and covid test.   Expected Discharge Plan: Skilled Nursing Facility Barriers to Discharge: No Barriers Identified   Patient Goals and CMS Choice Patient states their goals for this hospitalization and ongoing recovery are:: SNF for rehab CMS Medicare.gov Compare Post Acute Care list provided to:: Patient Choice offered to / list presented to : Patient  Expected Discharge Plan and Services Expected Discharge Plan: Skilled Nursing Facility In-house Referral: NA Discharge Planning Services: CM Consult Post Acute Care Choice: Skilled Nursing Facility Living arrangements for the past 2 months: Assisted Living Facility                 DME Arranged: (NA)         HH Arranged: NA          Prior Living Arrangements/Services Living arrangements for the past 2 months: Assisted Living Facility Lives with:: Facility Resident Patient language and need for interpreter reviewed:: Yes Do you feel safe going back to the place where you live?: Yes      Need for Family Participation in Patient Care: Yes (Comment) Care giver support system in place?: Yes (comment)   Criminal Activity/Legal Involvement Pertinent to Current Situation/Hospitalization: No - Comment as  needed  Activities of Daily Living Home Assistive Devices/Equipment: Environmental consultant (specify type), Eyeglasses, Other (Comment), Blood pressure cuff ADL Screening (condition at time of admission) Patient's cognitive ability adequate to safely complete daily activities?: Yes Is the patient deaf or have difficulty hearing?: No Does the patient have difficulty seeing, even when wearing glasses/contacts?: No Does the patient have difficulty concentrating, remembering, or making decisions?: Yes Patient able to express need for assistance with ADLs?: No Does the patient have difficulty dressing or bathing?: Yes Independently performs ADLs?: Yes (appropriate for developmental age) Does the patient have difficulty walking or climbing stairs?: Yes Weakness of Legs: None Weakness of Arms/Hands: None  Permission Sought/Granted                  Emotional Assessment Appearance:: Appears stated age Attitude/Demeanor/Rapport: Engaged Affect (typically observed): Appropriate Orientation: : Oriented to Self, Oriented to Place, Oriented to  Time, Oriented to Situation Alcohol / Substance Use: Not Applicable Psych Involvement: No (comment)  Admission diagnosis:  Bleeding [R58] Rectal bleed [K62.5] Generalized weakness [R53.1] Frequent falls [R29.6] Patient Active Problem List   Diagnosis Date Noted  . Protein-calorie malnutrition, severe 09/09/2019  . Rectal bleed 09/08/2019  . Hypoglycemia 09/07/2019  . Altered mental status 09/06/2019  . Stroke (cerebrum) (HCC) 02/03/2019  . Fall 02/03/2019  . Acute metabolic encephalopathy 02/03/2019  . Leukocytosis 02/03/2019  . Hypotension 02/03/2019  . Iron deficiency anemia 01/15/2019  .  Acute ischemic right MCA stroke ([place]) 01/14/2019  . Acute CVA (cerebrovascular accident) ([place]) 01/09/2019  . Epiretinal membrane, left eye 04/18/2017  . Acute blood loss anemia 09/27/2015  . Pelvic fracture ([place]) 09/21/2015  . Closed fracture of pelvis ([place])   .  Hypertensive retinopathy of both eyes 08/16/2015  . Hypertension 01/28/2014  . Kyphoscoliosis 09/16/2013  . After cataract 02/19/2013  . Pseudophakia of both eyes 01/22/2013  . Posterior capsular opacification 10/29/2012  . Cystoid macular edema 04/30/2012  . Epiretinal membrane 04/30/2012  . MVP (mitral valve prolapse) 05/24/2011  . Hypercholesterolemia 05/24/2011   PCP:  Roetta Sessions, NP Pharmacy:   [facility], [place] [address] [place] [address] [place] [address] Phone: [phone] Fax: [place], [place] [address] [place] [address] [place] [address] Phone: [phone] Fax: [phone]     Social Determinants of Health (SDOH) Interventions    Readmission Risk Interventions Readmission Risk Prevention Plan 09/09/2019  PCP or Specialist Appt within 5-7 Days Complete  Home Care Screening Complete  Medication Review (RN CM) Complete  Some encounter information is confidential and restricted. Go to Review Flowsheets activity to see all data.  Some recent data might be hidden

## 2019-09-09 NOTE — Progress Notes (Addendum)
Subjective: Patient was seen this morning on rounds.  She states that she is doing well at this time but denies any new symptoms overnight.  She states that she had a conversation with GI yesterday stating that she does not want to have a colonoscopy at this time.  She denies chest pain, shortness of breath, abdominal pain, problems urinating. States that she has not had a BM in 24 hours.   Objective:  Vital signs in last 24 hours: Vitals:   09/08/19 0800 09/08/19 2118 09/09/19 0659 09/09/19 0700  BP: (!) 143/67 (!) 112/54 (!) 129/47   Pulse: 72 75 90   Resp:  14 16   Temp: 98.6 F (37 C) 98.4 F (36.9 C) 98.3 F (36.8 C)   TempSrc: Oral Oral Oral   SpO2: 94% 94% 93%   Weight:    45.4 kg  Height:        Physical Exam: Physical Exam  Constitutional: No distress.  HENT:  Head: Atraumatic.  Eyes: EOM are normal.  Neck: Normal range of motion.  Cardiovascular: Normal rate, regular rhythm, normal heart sounds and intact distal pulses. Exam reveals no gallop and no friction rub.  No murmur heard. Pulmonary/Chest: Breath sounds normal. No respiratory distress. She exhibits no tenderness.  Abdominal: Soft. Bowel sounds are normal. She exhibits no distension. There is no abdominal tenderness.  Musculoskeletal:        General: No tenderness or edema.  Neurological: She is alert.  Skin: Skin is warm and dry. She is not diaphoretic.       Pertinent labs/Imaging: CBC Latest Ref Rng & Units 09/09/2019 09/08/2019 09/07/2019  WBC 4.0 - 10.5 K/uL 7.9 10.3 9.2  Hemoglobin 12.0 - 15.0 g/dL 11.7(L) 13.4 12.9  Hematocrit 36.0 - 46.0 % 37.0 41.2 41.4  Platelets 150 - 400 K/uL 288 285 236   CMP Latest Ref Rng & Units 09/09/2019 09/08/2019 09/07/2019  Glucose 70 - 99 mg/dL 86 97 50(L)  BUN 8 - 23 mg/dL 7(L) 11 24(H)  Creatinine 0.44 - 1.00 mg/dL 0.45 0.44 0.58  Sodium 135 - 145 mmol/L 138 137 140  Potassium 3.5 - 5.1 mmol/L 3.3(L) 3.9 4.0  Chloride 98 - 111 mmol/L 106 106 107  CO2  22 - 32 mmol/L 23 24 21(L)  Calcium 8.9 - 10.3 mg/dL 8.0(L) 8.2(L) 8.3(L)  Total Protein 6.5 - 8.1 g/dL - - -  Total Bilirubin 0.3 - 1.2 mg/dL - - -  Alkaline Phos 38 - 126 U/L - - -  AST 15 - 41 U/L - - -  ALT 0 - 44 U/L - - -    Assessment/Plan:  Active Problems:   Altered mental status   Hypoglycemia   Rectal bleed   Protein-calorie malnutrition, severe    Patient Summary: 85 year old female with a pertinent past medical history hypertension CVA, atrial fibrillation, anemia, who presented to [facility] with 2 weeks history of acute altered mental status with associated worsening balance and falls, increased sleepiness, and visual hallucinations. Upon arrival at the hospital the patient did appear ill but has since improved in the last 24 hours with IVF.   Patient's GI bleed is likely due to hemorrhoids in the setting of constipation.   She does not does not appear to have any more blood in her diaper at this time. Patient was seen by GI yesterday and declined colonoscopy at that time but would be agreeable to a colonoscopy if the bleeding recurs or is life-threatening.  She was  agreeable to CT scan to rule out any masses or other etiology for her GI bleed.  We will restart her Eliquis today due to risk of stroke and repeat CBC this afternoon to monitor carefully for recurrence of GI bleed.  #Altered mental status - Resolved  #Constipation: #Hemrrhoids: - Start bowel regimen with daily colace 100 mg and bisacodyl 10 mg suppository every other day as needed. - Sitz's bat and Preparation H cream  #Hypoglycemia  - Resolved - Continue Ensure supplement  #Rectal bleeding - No blood this morning on evaluation and note blood on nurses note.  - We will repeat CBC this afternoon and follow closely since we restarted her Eliquis today.  - CT abdomen pending   #Atrial Fibrillation -We will restart her Eliquis today and carefully monitor her for recurrence of her GI bleed.   #Anemia: - apparently has a past history of Iron deficiency anemia  - Anemia panel  Diet: Regular IVF: none VTE: Restart Eliquis today Code: DNR  Dispo: Anticipated discharge pending clinical improvement.   Dellia Cloud, MD 09/09/2019, 11:26 AM Pager: [phone]

## 2019-09-09 NOTE — Progress Notes (Signed)
Left floor for CT at approx 1036 with transport. Patent stated daughter Tenny Craw to arrive as visitor and may arrive while she is in CT. Patient arrived back from CT at approx 1130. [name] Maryan Rued at bedside. After patient care completed by NT and RN, [name] assisted patient with eating lunch.  Called tele at to confirm pt HR ST in 110's and 120's; confirmed in ST. RN to bedside, patient in chair, awake, no distress and no discomfort. Paged IM 1522 informed patient HR in high 110's and low 120's sustained for several minutes. no distress, OOB in chair alert. Spoke with MD Marianna Payment will come to bedside to assess.  Paged IM [phone].  Notification: Pt yellow MEWS.BP 126/74 & HR in 110's as previously informed. Patient no distress. Spoke with MD, informed to notify MD if HR goes above 120's. Informed tele to notify RN if HR reaches 130 even if not sustained. Informed night RN will be notified if HR 130 or above.

## 2019-09-09 NOTE — Progress Notes (Signed)
  Date: 09/09/2019  Patient name: Madison Odom  Medical record number: [id number]  Date of birth: 1934/08/22        I have seen and evaluated this patient and I have discussed the plan of care with the house staff. Please see their note for complete details. I concur with their findings with the following additions/corrections:   Patient here with GI bleed while on Eliquis. Initially held but resumed today after patient declined colonoscopy. She had a recent stroke in February 2/2 a-fib. CT abdomen today showed findings of possible colitis and diverticulosis of the left colon, but no large mass or occult malignancy. Given that she is afebrile with normal white count, will continue to monitor off antibiotics for now. Continues treatment of constipation and hemorrhoids per GI recs. Monitor hemoglobin, if stable tomorrow and no further bleeding, possible discharge back to facility.   Velna Ochs, MD 09/09/2019, 3:17 PM

## 2019-09-09 NOTE — NC FL2 (Signed)
[place] MEDICAID FL2 LEVEL OF CARE SCREENING TOOL     IDENTIFICATION  Patient Name: Madison Odom Birthdate: 1934-11-01 Sex: female Admission Date (Current Location): 09/06/2019  [facility] [place] Number:  Herbalist and Address:  [facility] [place], [place] [address] [place], [place] [address]      Provider Number: [id number]  Attending Physician Name and Address:  Velna Ochs, MD  Relative Name and Phone Number:  Eleonore Chiquito [phone]    Current Level of Care: Hospital Recommended Level of Care: [place] Prior Approval Number:    Date Approved/Denied:   PASRR Number: [id number]  Discharge Plan: SNF    Current Diagnoses: Patient Active Problem List   Diagnosis Date Noted  . Protein-calorie malnutrition, severe 09/09/2019  . Rectal bleed 09/08/2019  . Hypoglycemia 09/07/2019  . Altered mental status 09/06/2019  . Stroke (cerebrum) ([place]) 02/03/2019  . Fall 02/03/2019  . Acute metabolic encephalopathy 17/51/0258  . Leukocytosis 02/03/2019  . Hypotension 02/03/2019  . Iron deficiency anemia 01/15/2019  . Acute ischemic right MCA stroke ([place]) 01/14/2019  . Acute CVA (cerebrovascular accident) ([place]) 01/09/2019  . Epiretinal membrane, left eye 04/18/2017  . Acute blood loss anemia 09/27/2015  . Pelvic fracture ([place]) 09/21/2015  . Closed fracture of pelvis ([place])   . Hypertensive retinopathy of both eyes 08/16/2015  . Hypertension 01/28/2014  . Kyphoscoliosis 09/16/2013  . After cataract 02/19/2013  . Pseudophakia of both eyes 01/22/2013  . Posterior capsular opacification 10/29/2012  . Cystoid macular edema 04/30/2012  . Epiretinal membrane 04/30/2012  . MVP (mitral valve prolapse) 05/24/2011  . Hypercholesterolemia 05/24/2011    Orientation RESPIRATION BLADDER Height & Weight     Self, Time, Situation, Place  Normal Incontinent, External catheter Weight: 45.4 kg Height:  4\' 10"  (147.3 cm)   BEHAVIORAL SYMPTOMS/MOOD NEUROLOGICAL BOWEL NUTRITION STATUS      Incontinent Diet(see dc summary)  AMBULATORY STATUS COMMUNICATION OF NEEDS Skin   Extensive Assist Verbally Normal                       Personal Care Assistance Level of Assistance  Bathing, Feeding, Dressing Bathing Assistance: Maximum assistance Feeding assistance: Maximum assistance Dressing Assistance: Maximum assistance     Functional Limitations Info  Sight, Hearing, Speech Sight Info: Adequate Hearing Info: Adequate Speech Info: Adequate    SPECIAL CARE FACTORS FREQUENCY  PT (By licensed PT), OT (By licensed OT)     PT Frequency: 5x/week OT Frequency: 5x/week            Contractures      Additional Factors Info  Code Status, Allergies Code Status Info: DNR Allergies Info: Phenothiazines, Prochlorperazine, Benadryl ,Diphenhydramine, Ferrous Sulfate, Penicillins, Tape, Tetracycline           Current Medications (09/09/2019):  This is the current hospital active medication list Current Facility-Administered Medications  Medication Dose Route Frequency Provider Last Rate Last Dose  . apixaban (ELIQUIS) tablet 2.5 mg  2.5 mg Oral BID Erenest Blank, RPH   2.5 mg at 09/09/19 1024  . bisacodyl (DULCOLAX) suppository 10 mg  10 mg Rectal Wanita Chamberlain, MD   10 mg at 09/09/19 1025  . docusate sodium (COLACE) capsule 100 mg  100 mg Oral Daily Marianna Payment, MD   100 mg at 09/09/19 1024  . feeding supplement (ENSURE ENLIVE) (ENSURE ENLIVE) liquid 237 mL  237 mL Oral TID BM Seawell, Jaimie A, DO   237  mL at 09/09/19 1211  . fluticasone (FLONASE) 50 MCG/ACT nasal spray 1 spray  1 spray Each Nare Daily PRN Seawell, Jaimie A, DO      . Melatonin TABS 6 mg  6 mg Oral QHS PRN Seawell, Jaimie A, DO   6 mg at 09/08/19 0216  . multivitamin with minerals tablet 1 tablet  1 tablet Oral Daily Gust Rung, DO   1 tablet at 09/09/19 1024  . pantoprazole (PROTONIX) EC tablet 40 mg  40 mg Oral Daily  Seawell, Jaimie A, DO   40 mg at 09/09/19 1024  . polyethylene glycol (MIRALAX / GLYCOLAX) packet 17 g  17 g Oral Daily Dianah Field, PA-C   17 g at 09/09/19 1211  . potassium chloride SA (KLOR-CON) CR tablet 20 mEq  20 mEq Oral BID Dellia Cloud, MD   20 mEq at 09/09/19 1024  . senna-docusate (Senokot-S) tablet 1 tablet  1 tablet Oral QHS Seawell, Jaimie A, DO   1 tablet at 09/08/19 2232  . simvastatin (ZOCOR) tablet 40 mg  40 mg Oral QHS Seawell, Jaimie A, DO   40 mg at 09/08/19 2232  . thiamine (VITAMIN B-1) tablet 100 mg  100 mg Oral Daily Seawell, Jaimie A, DO   100 mg at 09/09/19 1025  . witch hazel-glycerin (TUCKS) pad 1 application  1 application Topical BID PRN Dellia Cloud, MD         Discharge Medications: Please see discharge summary for a list of discharge medications.  Relevant Imaging Results:  Relevant Lab Results:   Additional Information [phone]  Leone Haven, RN

## 2019-09-10 DIAGNOSIS — K5900 Constipation, unspecified: Secondary | ICD-10-CM

## 2019-09-10 DIAGNOSIS — R918 Other nonspecific abnormal finding of lung field: Secondary | ICD-10-CM

## 2019-09-10 LAB — BASIC METABOLIC PANEL
Anion gap: 8 (ref 5–15)
BUN: 9 mg/dL (ref 8–23)
CO2: 29 mmol/L (ref 22–32)
Calcium: 8.3 mg/dL — ABNORMAL LOW (ref 8.9–10.3)
Chloride: 101 mmol/L (ref 98–111)
Creatinine, Ser: 0.5 mg/dL (ref 0.44–1.00)
GFR calc Af Amer: >60 mL/min (ref >60)
GFR calc non Af Amer: >60 mL/min (ref >60)
Glucose, Bld: 95 mg/dL (ref 70–99)
Potassium: 4.3 mmol/L (ref 3.5–5.1)
Sodium: 138 mmol/L (ref 135–145)

## 2019-09-10 LAB — CBC
HCT: 35.3 % — ABNORMAL LOW (ref 36.0–46.0)
Hemoglobin: 11.5 g/dL — ABNORMAL LOW (ref 12.0–15.0)
MCH: 30.7 pg (ref 26.0–34.0)
MCHC: 32.6 g/dL (ref 30.0–36.0)
MCV: 94.1 fL (ref 80.0–100.0)
Platelets: 327 10*3/uL (ref 150–400)
RBC: 3.75 MIL/uL — ABNORMAL LOW (ref 3.87–5.11)
RDW: 14.3 % (ref 11.5–15.5)
WBC: 7.3 10*3/uL (ref 4.0–10.5)
nRBC: 0 % (ref 0.0–0.2)

## 2019-09-10 LAB — GLUCOSE, CAPILLARY
Glucose-Capillary: 86 mg/dL (ref 70–99)
Glucose-Capillary: 103 mg/dL — ABNORMAL HIGH (ref 70–99)

## 2019-09-10 LAB — SARS CORONAVIRUS 2 (TAT 6-24 HRS): SARS Coronavirus 2: NEGATIVE

## 2019-09-10 MED ORDER — BISACODYL 10 MG RE SUPP: 10.0000 mg | RECTAL | 0 refills | Status: AC

## 2019-09-10 MED ORDER — WITCH HAZEL-GLYCERIN EX PADS: 1.0000 "application " | MEDICATED_PAD | Freq: Two times a day (BID) | CUTANEOUS | 12 refills | Status: AC | PRN

## 2019-09-10 NOTE — Care Management Important Message (Signed)
Important Message  Patient Details  Name: Madison Odom MRN: [id number] Date of Birth: 1934-09-11   Medicare Important Message Given:  Yes     Shelda Altes 09/10/2019, 11:31 AM

## 2019-09-10 NOTE — Discharge Summary (Signed)
Name: Madison Odom MRN: [id number] DOB: 1934-02-13 85 y.o. PCP: Madison Sessions, NP  Date of Admission: 09/06/2019 11:31 AM Date of Discharge:  Attending Physician: Madison Ochs, MD  Discharge Diagnosis: 1. Lower GI Bleed  Discharge Medications: Allergies as of 09/10/2019      Reactions   Phenothiazines Anaphylaxis   Prochlorperazine Anaphylaxis   Benadryl [diphenhydramine] Other (See Comments)   Agitation and confusion    Ferrous Sulfate Diarrhea, Other (See Comments)   Abdominal pain, also   Penicillins Other (See Comments)   Did it involve swelling of the face/tongue/throat, SOB, or low BP? No- yeast infections Did it involve sudden or severe rash/hives, skin peeling, or any reaction on the inside of your mouth or nose? No Did you need to seek medical attention at a hospital or doctor's office? No When did it last happen? "30 years ago" If all above answers are "NO", may proceed with cephalosporin use.   Tape Other (See Comments)   PATIENT'S SKIN IS THIN AND TEARS VERY EASILY- PLEASE USE A TAPE ALTERNATIVE!! PATIENT ALSO TAKES ELIQUIS.   Tetracycline Other (See Comments)   Yeast infections      Medication List    STOP taking these medications   loperamide 2 MG tablet Commonly known as: IMODIUM A-D     TAKE these medications   acetaminophen 325 MG tablet Commonly known as: TYLENOL Take 1-2 tablets (325-650 mg total) by mouth every 4 (four) hours as needed for mild pain. What changed: how much to take   Biofreeze 4 % Gel Generic drug: Menthol (Topical Analgesic) Apply 1 application topically See admin instructions. Apply to knees and lower back topically every 8 hours as needed for pain   bisacodyl 10 MG suppository Commonly known as: DULCOLAX Place 1 suppository (10 mg total) rectally every other day. PRN constipation Start taking on: September 11, 2019   buPROPion 75 MG tablet Commonly known as: WELLBUTRIN Take 37.5 mg by mouth every morning.    Cerovite Senior Tabs Take 1 tablet by mouth daily with breakfast.   docusate sodium 100 MG capsule Commonly known as: COLACE Take 100 mg by mouth 2 (two) times daily.   Eliquis 2.5 MG Tabs tablet Generic drug: apixaban Take 2.5 mg by mouth 2 (two) times daily.   Ensure Take 237 mLs by mouth daily.   FerrouSul 325 (65 FE) MG tablet Generic drug: ferrous sulfate Take 325 mg by mouth daily with breakfast.   ferrous sulfate 325 (65 FE) MG tablet Take 1 tablet (325 mg total) by mouth daily at 12 noon.   Melatonin 10 MG Caps Take 10 mg by mouth at bedtime.   nadolol 20 MG tablet Commonly known as: CORGARD Take 1 tablet (20 mg total) by mouth daily. What changed: when to take this   Oyster Calcium/D3 500-200 MG-UNIT Tabs Generic drug: Calcium Carb-Cholecalciferol Take 1 tablet by mouth daily.   pantoprazole 40 MG tablet Commonly known as: PROTONIX TAKE 1 TABLET BY MOUTH EVERY DAY   simvastatin 40 MG tablet Commonly known as: ZOCOR TAKE 1 TABLET BY MOUTH EVERY DAY AT 6 PM. What changed: See the new instructions.   traZODone 50 MG tablet Commonly known as: DESYREL Take 50 mg by mouth at bedtime.   Tums Extra Strength 750 750 MG chewable tablet Generic drug: calcium carbonate Chew 2 tablets by mouth as needed (for indigestion).   witch hazel-glycerin pad Commonly known as: TUCKS Apply 1 application topically 2 (two) times daily as needed for  hemorrhoids.       Disposition and follow-up:   Ms.Madison Odom was discharged from [facility] in Good condition.  At the hospital follow up visit please address:  1.  Follow up:  (1) Lower GI bleed - please make sure Mrs. Madison Odom is not having any more bleeding, considering she is on Eliquis for A. Fib.  (2) Constipation - please make sure patient is on a good bowel regimen.  (3) Hemorrhoid - GI bleed likely 2/2 internal hemorrhoids. Please make sure patient can gets Sitz's bath and preparation H as  needed.    2.  Labs / imaging needed at time of follow-up: CBC to monitor Hgb.   3.  Pending labs/ test needing follow-up: none  Follow-up Appointments: Contact information for after-discharge care    Destination    [facility] .   Service: Skilled Nursing Contact information: [address] [place] [address] [phone]             You were hospitalized for GI bleed. Thank you for allowing [place] to be part of your care.   We arranged for you to follow up at: [facility] will Call you to make a follow up appointment.    Please note these changes made to your medications:   Please START taking:   - Please start home medication back at this time.  Please STOP taking:    Please make sure to follow up with your PCP  Please call our clinic if you have any questions or concerns, we may be able to help and keep you from a long and expensive emergency room wait. Our clinic and after hours phone number is [phone], the best time to call is Monday through Friday 9 am to 4 pm but there is always someone available 24/7 if you have an emergency. If you need medication refills please notify your pharmacy one week in advance and they will send [place] a request.    Hospital Course by problem list: 1. Altered Mental Status -patient presented to [facility] on 10/24 with altered mental status.  [name] states that she has had roughly a 2-week history of lethargy, worsening functional status, visual hallucinations, and multiple falls.  On evaluation in the ED she was sleepy but arousable and oriented to person and place but poor insight into why she was at the hospital or  her medical conditions.  Patient received IV fluids overnight with improved mentation and resolution of her symptoms.  Patient does have a history of poor p.o. intake.  Her daughter states that she is a picky eater but has had worse intake since her stroke in  February.  2.  Hypoglycemia -patient had a single episode of hypoglycemia likely due to decreased p.o. intake.  Resolved with juice.  3.  Lower GI bleed -patient presented with multiple episodes of bright red blood per rectum.  Patient's Eliquis was stopped at this time.  Abdominal x-ray showed signs concerning for constipation. GI was consulted and patient declined further work-up including colonoscopy at this time.  Patient was restarted on her Eliquis at that time.  During his hospital admission patient's hemoglobin has been stable at roughly 11.5 with resolution of her rectal bleed for greater than 24 hours.  During the patient declined colonoscopy at this time, CT of the abdomen was performed to rule out any underlying masses or cancers.  CT scan showed no masses in the colon with some  mild diverticula.     4. Pulmonary Nodules -CT scan abdomen showed 2 incidental pulmonary nodules. The patient was informed about the pulmonary nodules and counseled regarding her risk of malignancy.  We discussed the process for further work-up and evaluation of these nodules.  After fully discussing the risks versus the benefits of further work-up for these nodules the patient declined any additional work-up.   Discharge Vitals:   BP 129/65 (BP Location: Left Arm)   Pulse 74   Temp 97.6 F (36.4 C) (Oral)   Resp 18   Ht 4\' 10"  (1.473 m)   Wt 44.5 kg   SpO2 97%   BMI 20.48 kg/m   Pertinent Labs, Studies, and Procedures:  CBC Latest Ref Rng & Units 09/10/2019 09/09/2019 09/09/2019  WBC 4.0 - 10.5 K/uL 7.3 9.9 7.9  Hemoglobin 12.0 - 15.0 g/dL 11.5(L) 13.8 11.7(L)  Hematocrit 36.0 - 46.0 % 35.3(L) 43.7 37.0  Platelets 150 - 400 K/uL 327 390 288   CMP Latest Ref Rng & Units 09/10/2019 09/09/2019 09/08/2019  Glucose 70 - 99 mg/dL 95 86 97  BUN 8 - 23 mg/dL 9 7(L) 11  Creatinine 1.610.44 - 1.00 mg/dL 0.960.50 0.450.45 4.090.44  Sodium 135 - 145 mmol/L 138 138 137  Potassium 3.5 - 5.1 mmol/L 4.3 3.3(L) 3.9  Chloride  98 - 111 mmol/L 101 106 106  CO2 22 - 32 mmol/L 29 23 24   Calcium 8.9 - 10.3 mg/dL 8.3(L) 8.0(L) 8.2(L)  Total Protein 6.5 - 8.1 g/dL - - -  Total Bilirubin 0.3 - 1.2 mg/dL - - -  Alkaline Phos 38 - 126 U/L - - -  AST 15 - 41 U/L - - -  ALT 0 - 44 U/L - - -   CT abdomen: Limited CT with the absence of IV contrast and enteric contrast, however, there is no evidence of bowel obstruction, with no concerning fluid level of the small bowel or colon.  No peritoneal or retroperitoneal hematoma identified.  Questionable inflammatory changes of the rectosigmoid colon, potentially colitis. Diverticular change of the left colon.  Consolidation of the medial segment of the right middle lobe concerning for lobar pneumonia.  Several nodules at the lung base with the largest measuring 5 mm. No follow-up needed if patient is low-risk (and has no known or suspected primary neoplasm). Non-contrast chest CT can be considered in 12 months if patient is high-risk. This recommendation follows the consensus statement: Guidelines for Management of Incidental Pulmonary Nodules Detected on CT Images: From the Fleischner Society 2017; Radiology 2017; 284:228-243.  Aortic Atherosclerosis (ICD10-I70.0).   Discharge Instructions: Discharge Instructions    Diet - low sodium heart healthy   Complete by: As directed    Discharge instructions   Complete by: As directed    You were hospitalized for Rectal Bleed. Thank you for allowing us to be part of your care.   We arranged for you to follow up at:   - Please make a follow up appointment with your PCP in 1 to 2 weeks.   Please note these changes made to your medications:   Please START taking:    Please STOP taking:   - Continue home medications and follow up with your primary care physician for any necessary medication changes.  Please make sure to follow up with your PCP.  Please call our clinic if you have any questions or concerns,  we may be able to help and keep you from a long and expensive emergency room  wait. Our clinic and after hours phone number is [phone], the best time to call is Monday through Friday 9 am to 4 pm but there is always someone available 24/7 if you have an emergency. If you need medication refills please notify your pharmacy one week in advance and they will send [place] a request.   Increase activity slowly   Complete by: As directed       Signed: Dellia Cloud, MD 09/10/2019, 11:11 AM   Pager: [phone]

## 2019-09-10 NOTE — TOC Transition Note (Signed)
Transition of Care [facility]) - CM/SW Discharge Note   Patient Details  Name: Madison Odom MRN: [id number] Date of Birth: Feb 24, 1934  Transition of Care [facility]) CM/SW Contact:  Zenon Mayo, RN Phone Number: 09/10/2019, 12:48 PM   Clinical Narrative:    NCM has received authorization for SNF start date 10/28 , next review date is 10/30, auth number is [id number].  NCM has contacted [facility] to let them has received auth.  [facility] can take patient today.  NCM notified daughter Benjamine Mola who is in the room with patient.  Patient will be going to room 105 P, call  [phone] for report.    Final next level of care: Skilled Nursing Facility Barriers to Discharge: No Barriers Identified   Patient Goals and CMS Choice Patient states their goals for this hospitalization and ongoing recovery are:: SNF CMS Medicare.gov Compare Post Acute Care list provided to:: Patient Choice offered to / list presented to : Patient  Discharge Placement              Patient chooses bed at: [facility] Patient to be transferred to facility by: [facility] Name of family member notified: Benjamine Mola Patient and family notified of of transfer: 09/10/19  Discharge Plan and Services In-house Referral: NA Discharge Planning Services: CM Consult Post Acute Care Choice: [place]          DME Arranged: (NA)         HH Arranged: NA          Social Determinants of Health ([place]) Interventions     Readmission Risk Interventions Readmission Risk Prevention Plan 09/09/2019  PCP or Specialist Appt within 5-7 Days Complete  Home Care Screening Complete  Medication Review (RN CM) Complete  Some encounter information is confidential and restricted. Go to Review Flowsheets activity to see all data.  Some recent data might be hidden

## 2019-09-10 NOTE — Progress Notes (Signed)
Subjective: Patient was seen this morning on rounds. She states that she had no new complaints overnight. She denies chest pain, shortness of breath, abdominal pain, problems with bowel or bladder.   Objective:  Vital signs in last 24 hours: Vitals:   09/09/19 2300 09/10/19 0346 09/10/19 0352 09/10/19 1120  BP:   129/65 (!) 110/49  Pulse: 82  74 97  Resp:   18 18  Temp:   97.6 F (36.4 C) (!) 97.5 F (36.4 C)  TempSrc:   Oral Oral  SpO2:   97% 98%  Weight:  44.5 kg    Height:        Physical Exam: Physical Exam  Constitutional: She is oriented to person, place, and time. She appears cachectic. No distress.  HENT:  Head: Atraumatic.  Eyes: EOM are normal.  Neck: Normal range of motion.  Cardiovascular: Normal rate, regular rhythm, normal heart sounds and intact distal pulses. Exam reveals no gallop and no friction rub.  No murmur heard. Pulmonary/Chest: Effort normal and breath sounds normal. No respiratory distress. She exhibits no tenderness.  Abdominal: Soft. Bowel sounds are normal. She exhibits no distension and no mass.  Musculoskeletal: Normal range of motion.        General: No edema.  Neurological: She is alert and oriented to person, place, and time.  Skin: Skin is warm and dry. She is not diaphoretic.       Pertinent labs/Imaging: CBC Latest Ref Rng & Units 09/10/2019 09/09/2019 09/09/2019  WBC 4.0 - 10.5 K/uL 7.3 9.9 7.9  Hemoglobin 12.0 - 15.0 g/dL 11.5(L) 13.8 11.7(L)  Hematocrit 36.0 - 46.0 % 35.3(L) 43.7 37.0  Platelets 150 - 400 K/uL 327 390 288   CMP Latest Ref Rng & Units 09/10/2019 09/09/2019 09/08/2019  Glucose 70 - 99 mg/dL 95 86 97  BUN 8 - 23 mg/dL 9 7(L) 11  Creatinine 0.44 - 1.00 mg/dL 0.50 0.45 0.44  Sodium 135 - 145 mmol/L 138 138 137  Potassium 3.5 - 5.1 mmol/L 4.3 3.3(L) 3.9  Chloride 98 - 111 mmol/L 101 106 106  CO2 22 - 32 mmol/L 29 23 24   Calcium 8.9 - 10.3 mg/dL 8.3(L) 8.0(L) 8.2(L)  Total Protein 6.5 - 8.1 g/dL - - -  Total  Bilirubin 0.3 - 1.2 mg/dL - - -  Alkaline Phos 38 - 126 U/L - - -  AST 15 - 41 U/L - - -  ALT 0 - 44 U/L - - -    CT abdomen: Limited CT with the absence of IV contrast and enteric contrast, however, there is no evidence of bowel obstruction, with no concerning fluid level of the small bowel or colon.  No peritoneal or retroperitoneal hematoma identified.  Questionable inflammatory changes of the rectosigmoid colon, potentially colitis. Diverticular change of the left colon.  Consolidation of the medial segment of the right middle lobe concerning for lobar pneumonia.  Several nodules at the lung base with the largest measuring 5 mm. No follow-up needed if patient is low-risk (and has no known or suspected primary neoplasm). Non-contrast chest CT can be considered in 12 months if patient is high-risk. This recommendation follows the consensus statement: Guidelines for Management of Incidental Pulmonary Nodules Detected on CT Images: From the Fleischner Society 2017; Radiology 2017; 284:228-243.  Assessment/Plan:  Active Problems:   Altered mental status   Hypoglycemia   Rectal bleed   Protein-calorie malnutrition, severe   Bleeding    Patient Summary: 85 year old female with a pertinent past  medical history hypertension CVA, atrial fibrillation, anemia, who presented to [facility] with 2 weeks history of acute altered mental status with associated worsening balance and falls, increased sleepiness, and visual hallucinations. Upon arrival at the hospital the patient did appear ill but has since improved in the last 24 hourswith IVF. Patient's GI bleed is likely due to hemorrhoids in the setting of constipation.     Patient is alert and oriented to person place time and event.  She denies new symptoms overnight.  Patient does not show any signs of further GI bleed.  CT results showed incidental pulmonary nodules in her lungs.  Patient was counseled regarding further  evaluation and management of these nodules and her risk of possible malignancy.  The patient declined further follow-up/work-up for these nodules.  We will continue Eliquis to decrease her risk of further strokes.  Otherwise, the patient is stable for discharge to her facility.   #Constipation: #Hemrrhoids: - Continue bowel regimen with daily colace 100 mg and bisacodyl 10 mg suppository every other day as needed. - Continue Sitz's bat and Preparation H cream  #Rectal bleeding -Patient has not had rectal bleeding in over 24 hours.   -Hemoglobin remained stable.   #Atrial Fibrillation -Continue Eliquis  Diet: Regular IVF: none VTE: Eliquis Code: DNR  Dispo: Anticipated discharge today.   Dellia Cloud, MD 09/10/2019, 1:33 PM Pager: [phone]

## 2019-09-10 NOTE — TOC Progression Note (Addendum)
Transition of Care [facility]) - Progression Note    Patient Details  Name: Madison Odom MRN: [id number] Date of Birth: 03/31/1934  Transition of Care [facility]) CM/SW Contact  Zenon Mayo, RN Phone Number: 09/10/2019, 10:11 AM  Clinical Narrative:    NCM has received authorization for SNF start date 10/28 , next review date is 10/30, auth number is [id number].  NCM has contacted [facility] to let them has received auth.  [facility] can take patient today.  NCM notified daughter Benjamine Mola who is in the room with patient.  Patient will be going to room 105 P, call  [phone] for report.   Expected Discharge Plan: [place] Barriers to Discharge: No Barriers Identified  Expected Discharge Plan and Services Expected Discharge Plan: [place] In-house Referral: NA Discharge Planning Services: CM Consult Post Acute Care Choice: [place] Living arrangements for the past 2 months: [place]                 DME Arranged: (NA)         HH Arranged: NA           Social Determinants of Health ([place]) Interventions    Readmission Risk Interventions Readmission Risk Prevention Plan 09/09/2019  PCP or Specialist Appt within 5-7 Days Complete  Home Care Screening Complete  Medication Review (RN CM) Complete  Some encounter information is confidential and restricted. Go to Review Flowsheets activity to see all data.  Some recent data might be hidden

## 2019-09-10 NOTE — Progress Notes (Signed)
Gave report to Maria Antonia, a Marine scientist at [facility].

## 2019-09-10 NOTE — Plan of Care (Signed)
  Problem: Education: Goal: Knowledge of General Education information will improve Description: Including pain rating scale, medication(s)/side effects and non-pharmacologic comfort measures Outcome: Progressing   Problem: Clinical Measurements: Goal: Ability to maintain clinical measurements within normal limits will improve Outcome: Progressing Goal: Will remain free from infection Outcome: Progressing Goal: Diagnostic test results will improve Outcome: Progressing   Problem: Elimination: Goal: Will not experience complications related to bowel motility Outcome: Progressing Goal: Will not experience complications related to urinary retention Outcome: Progressing   Problem: Pain Managment: Goal: General experience of comfort will improve Outcome: Progressing   Problem: Safety: Goal: Ability to remain free from injury will improve Outcome: Progressing Note: Patient is on a low bed. She understands to call if she needs assistance.    Problem: Skin Integrity: Goal: Risk for impaired skin integrity will decrease Outcome: Progressing

## 2019-09-10 NOTE — Progress Notes (Signed)
  Date: 09/10/2019  Patient name: [place] record number: [id number]  Date of birth: Mar 25, 1934        I have seen and evaluated this patient and I have discussed the plan of care with the house staff. Please see their note for complete details. I concur with their findings.  Velna Ochs, MD 09/10/2019, 2:43 PM

## 2019-09-10 NOTE — Progress Notes (Signed)
PT Cancellation Note  Patient Details Name: Madison Odom MRN: [id number] DOB: 1934/10/20   Cancelled Treatment:    Reason Eval/Treat Not Completed: Other (comment). Noted patient d/c to SNF today; will defer treatment.  Mabeline Caras, PT, DPT Acute Rehabilitation Services  Pager [phone] Office [place] 09/10/2019, 12:19 PM

## 2019-09-11 LAB — CULTURE, BLOOD (ROUTINE X 2)
Culture: NO GROWTH
Special Requests: ADEQUATE

## 2019-09-14 LAB — METHYLMALONIC ACID, SERUM: Methylmalonic Acid, Quantitative: 68 nmol/L (ref 0–378)

## 2019-09-19 NOTE — Progress Notes (Signed)
Carelink Summary Report / Loop Recorder 

## 2019-10-05 LAB — CUP PACEART REMOTE DEVICE CHECK
Date Time Interrogation Session: 20201121173955
Implantable Pulse Generator Implant Date: 20200302

## 2019-10-06 ENCOUNTER — Ambulatory Visit (INDEPENDENT_AMBULATORY_CARE_PROVIDER_SITE_OTHER): Payer: Medicare Other | Admitting: *Deleted

## 2019-10-06 DIAGNOSIS — I639 Cerebral infarction, unspecified: Secondary | ICD-10-CM

## 2019-11-06 ENCOUNTER — Ambulatory Visit (INDEPENDENT_AMBULATORY_CARE_PROVIDER_SITE_OTHER): Payer: Medicare Other | Admitting: *Deleted

## 2019-11-06 DIAGNOSIS — I639 Cerebral infarction, unspecified: Secondary | ICD-10-CM | POA: Diagnosis not present

## 2019-11-08 LAB — CUP PACEART REMOTE DEVICE CHECK
Date Time Interrogation Session: 20201224174638
Implantable Pulse Generator Implant Date: 20200302

## 2019-12-08 ENCOUNTER — Ambulatory Visit (INDEPENDENT_AMBULATORY_CARE_PROVIDER_SITE_OTHER): Payer: Medicare Other | Admitting: *Deleted

## 2019-12-08 DIAGNOSIS — I639 Cerebral infarction, unspecified: Secondary | ICD-10-CM | POA: Diagnosis not present

## 2019-12-08 LAB — CUP PACEART REMOTE DEVICE CHECK
Date Time Interrogation Session: 20210124233921
Implantable Pulse Generator Implant Date: 20200302

## 2020-01-08 ENCOUNTER — Ambulatory Visit (INDEPENDENT_AMBULATORY_CARE_PROVIDER_SITE_OTHER): Payer: Medicare Other | Admitting: *Deleted

## 2020-01-08 DIAGNOSIS — I639 Cerebral infarction, unspecified: Secondary | ICD-10-CM | POA: Diagnosis not present

## 2020-01-08 LAB — CUP PACEART REMOTE DEVICE CHECK
Date Time Interrogation Session: 20210225000822
Implantable Pulse Generator Implant Date: 20200302

## 2020-01-08 NOTE — Progress Notes (Signed)
ILR Remote 

## 2020-01-09 ENCOUNTER — Telehealth: Payer: Self-pay

## 2020-01-09 NOTE — Telephone Encounter (Signed)
Pt daughter returned call, she spoke with pt.  Pt does not recall any symptoms.   Advise dwill continue to monitor for now.

## 2020-01-09 NOTE — Telephone Encounter (Signed)
LINQ alert received- AF episode 01/09/20 1922 lasting 22 minutes VR 100-120 bpm.  Pt has known PAF current meds include Eliquis and Nadolol.    Spoke with pt daughter, Lanora Manis, [place] on record.  She states pt is in ALF but she will reach out to determine if symptomatic.  Recommended if symptomatic f/u should be doen with AF clinic as pt AF ahs been managed there.

## 2020-01-09 NOTE — Telephone Encounter (Signed)
Follow up     Pts daughter is returning call    Please call

## 2020-01-21 ENCOUNTER — Encounter: Payer: Medicare Other | Admitting: Adult Health

## 2020-02-08 LAB — CUP PACEART REMOTE DEVICE CHECK
Date Time Interrogation Session: 20210328023754
Implantable Pulse Generator Implant Date: 20200302

## 2020-02-09 ENCOUNTER — Ambulatory Visit (INDEPENDENT_AMBULATORY_CARE_PROVIDER_SITE_OTHER): Payer: Medicare Other | Admitting: *Deleted

## 2020-02-09 DIAGNOSIS — I639 Cerebral infarction, unspecified: Secondary | ICD-10-CM | POA: Diagnosis not present

## 2020-02-09 NOTE — Progress Notes (Signed)
ILR Remote 

## 2020-03-11 ENCOUNTER — Ambulatory Visit (INDEPENDENT_AMBULATORY_CARE_PROVIDER_SITE_OTHER): Payer: Medicare Other | Admitting: *Deleted

## 2020-03-11 DIAGNOSIS — I639 Cerebral infarction, unspecified: Secondary | ICD-10-CM

## 2020-03-11 LAB — CUP PACEART REMOTE DEVICE CHECK
Date Time Interrogation Session: 20210429001117
Implantable Pulse Generator Implant Date: 20200302

## 2020-03-12 NOTE — Progress Notes (Signed)
ILR Remote 

## 2020-04-15 ENCOUNTER — Ambulatory Visit (INDEPENDENT_AMBULATORY_CARE_PROVIDER_SITE_OTHER): Payer: Medicare Other | Admitting: *Deleted

## 2020-04-15 DIAGNOSIS — I639 Cerebral infarction, unspecified: Secondary | ICD-10-CM | POA: Diagnosis not present

## 2020-04-15 LAB — CUP PACEART REMOTE DEVICE CHECK
Date Time Interrogation Session: 20210602232703
Implantable Pulse Generator Implant Date: 20200302

## 2020-04-20 NOTE — Progress Notes (Signed)
Carelink Summary Report / Loop Recorder 

## 2020-05-18 ENCOUNTER — Ambulatory Visit (INDEPENDENT_AMBULATORY_CARE_PROVIDER_SITE_OTHER): Payer: Medicare Other | Admitting: *Deleted

## 2020-05-18 DIAGNOSIS — I639 Cerebral infarction, unspecified: Secondary | ICD-10-CM

## 2020-05-18 LAB — CUP PACEART REMOTE DEVICE CHECK
Date Time Interrogation Session: 20210706024901
Implantable Pulse Generator Implant Date: 20200302

## 2020-05-19 NOTE — Progress Notes (Signed)
Carelink Summary Report / Loop Recorder 

## 2020-06-21 ENCOUNTER — Ambulatory Visit (INDEPENDENT_AMBULATORY_CARE_PROVIDER_SITE_OTHER): Payer: Medicare Other | Admitting: *Deleted

## 2020-06-21 DIAGNOSIS — I639 Cerebral infarction, unspecified: Secondary | ICD-10-CM | POA: Diagnosis not present

## 2020-06-21 LAB — CUP PACEART REMOTE DEVICE CHECK
Date Time Interrogation Session: 20210808024827
Implantable Pulse Generator Implant Date: 20200302

## 2020-06-22 NOTE — Progress Notes (Signed)
Carelink Summary Report / Loop Recorder 

## 2020-07-26 ENCOUNTER — Ambulatory Visit (INDEPENDENT_AMBULATORY_CARE_PROVIDER_SITE_OTHER): Payer: Medicare Other | Admitting: *Deleted

## 2020-07-26 DIAGNOSIS — I639 Cerebral infarction, unspecified: Secondary | ICD-10-CM

## 2020-07-26 LAB — CUP PACEART REMOTE DEVICE CHECK
Date Time Interrogation Session: 20210910025033
Implantable Pulse Generator Implant Date: 20200302

## 2020-07-28 NOTE — Progress Notes (Signed)
Carelink Summary Report / Loop Recorder 

## 2020-08-25 LAB — CUP PACEART REMOTE DEVICE CHECK
Date Time Interrogation Session: 20211013025250
Implantable Pulse Generator Implant Date: 20200302

## 2020-08-30 ENCOUNTER — Ambulatory Visit (INDEPENDENT_AMBULATORY_CARE_PROVIDER_SITE_OTHER): Payer: Medicare Other

## 2020-08-30 DIAGNOSIS — I639 Cerebral infarction, unspecified: Secondary | ICD-10-CM | POA: Diagnosis not present

## 2020-09-01 NOTE — Progress Notes (Signed)
Carelink Summary Report / Loop Recorder 

## 2020-09-27 ENCOUNTER — Ambulatory Visit (INDEPENDENT_AMBULATORY_CARE_PROVIDER_SITE_OTHER): Payer: Medicare Other

## 2020-09-27 DIAGNOSIS — I639 Cerebral infarction, unspecified: Secondary | ICD-10-CM | POA: Diagnosis not present

## 2020-09-27 LAB — CUP PACEART REMOTE DEVICE CHECK
Date Time Interrogation Session: 20211115015302
Implantable Pulse Generator Implant Date: 20200302

## 2020-09-28 NOTE — Progress Notes (Signed)
Carelink Summary Report / Loop Recorder 

## 2020-10-29 ENCOUNTER — Ambulatory Visit (INDEPENDENT_AMBULATORY_CARE_PROVIDER_SITE_OTHER): Payer: Medicare Other

## 2020-10-29 DIAGNOSIS — I639 Cerebral infarction, unspecified: Secondary | ICD-10-CM | POA: Diagnosis not present

## 2020-10-31 LAB — CUP PACEART REMOTE DEVICE CHECK
Date Time Interrogation Session: 20211218015331
Implantable Pulse Generator Implant Date: 20200302

## 2020-11-11 NOTE — Progress Notes (Signed)
Carelink Summary Report / Loop Recorder 

## 2020-11-30 ENCOUNTER — Ambulatory Visit (INDEPENDENT_AMBULATORY_CARE_PROVIDER_SITE_OTHER): Payer: Medicare Other

## 2020-11-30 DIAGNOSIS — I639 Cerebral infarction, unspecified: Secondary | ICD-10-CM

## 2020-12-02 LAB — CUP PACEART REMOTE DEVICE CHECK
Date Time Interrogation Session: 20220120015509
Implantable Pulse Generator Implant Date: 20200302

## 2020-12-14 NOTE — Progress Notes (Signed)
Carelink Summary Report / Loop Recorder 

## 2021-01-03 ENCOUNTER — Ambulatory Visit (INDEPENDENT_AMBULATORY_CARE_PROVIDER_SITE_OTHER): Payer: Medicare Other

## 2021-01-03 DIAGNOSIS — I639 Cerebral infarction, unspecified: Secondary | ICD-10-CM

## 2021-01-04 LAB — CUP PACEART REMOTE DEVICE CHECK
Date Time Interrogation Session: 20220222020429
Implantable Pulse Generator Implant Date: 20200302

## 2021-01-07 NOTE — Progress Notes (Signed)
Carelink Summary Report / Loop Recorder 

## 2021-02-04 ENCOUNTER — Ambulatory Visit (INDEPENDENT_AMBULATORY_CARE_PROVIDER_SITE_OTHER): Payer: Medicare Other

## 2021-02-04 DIAGNOSIS — I639 Cerebral infarction, unspecified: Secondary | ICD-10-CM | POA: Diagnosis not present

## 2021-02-06 LAB — CUP PACEART REMOTE DEVICE CHECK
Date Time Interrogation Session: 20220327031055
Implantable Pulse Generator Implant Date: 20200302

## 2021-02-15 NOTE — Progress Notes (Signed)
Carelink Summary Report / Loop Recorder 

## 2021-03-08 ENCOUNTER — Ambulatory Visit (INDEPENDENT_AMBULATORY_CARE_PROVIDER_SITE_OTHER): Payer: Medicare Other

## 2021-03-08 DIAGNOSIS — I639 Cerebral infarction, unspecified: Secondary | ICD-10-CM | POA: Diagnosis not present

## 2021-03-08 LAB — CUP PACEART REMOTE DEVICE CHECK
Date Time Interrogation Session: 20220425230550
Implantable Pulse Generator Implant Date: 20200302

## 2021-03-28 NOTE — Progress Notes (Signed)
Carelink Summary Report / Loop Recorder 

## 2021-04-11 LAB — CUP PACEART REMOTE DEVICE CHECK
Date Time Interrogation Session: 20220528230414
Implantable Pulse Generator Implant Date: 20200302

## 2021-04-12 ENCOUNTER — Ambulatory Visit (INDEPENDENT_AMBULATORY_CARE_PROVIDER_SITE_OTHER): Payer: Medicare Other

## 2021-04-12 DIAGNOSIS — I639 Cerebral infarction, unspecified: Secondary | ICD-10-CM | POA: Diagnosis not present

## 2021-05-04 NOTE — Progress Notes (Signed)
Carelink Summary Report / Loop Recorder 

## 2021-05-13 ENCOUNTER — Ambulatory Visit (INDEPENDENT_AMBULATORY_CARE_PROVIDER_SITE_OTHER): Payer: Medicare Other

## 2021-05-13 DIAGNOSIS — I639 Cerebral infarction, unspecified: Secondary | ICD-10-CM | POA: Diagnosis not present

## 2021-05-13 LAB — CUP PACEART REMOTE DEVICE CHECK
Date Time Interrogation Session: 20220630231930
Implantable Pulse Generator Implant Date: 20200302

## 2021-06-01 NOTE — Progress Notes (Signed)
Carelink Summary Report / Loop Recorder 

## 2021-06-14 ENCOUNTER — Ambulatory Visit (INDEPENDENT_AMBULATORY_CARE_PROVIDER_SITE_OTHER): Payer: Medicare Other

## 2021-06-14 DIAGNOSIS — I639 Cerebral infarction, unspecified: Secondary | ICD-10-CM | POA: Diagnosis not present

## 2021-06-15 LAB — CUP PACEART REMOTE DEVICE CHECK
Date Time Interrogation Session: 20220802232146
Implantable Pulse Generator Implant Date: 20200302

## 2021-07-08 NOTE — Progress Notes (Signed)
Carelink Summary Report / Loop Recorder 

## 2021-07-19 ENCOUNTER — Ambulatory Visit (INDEPENDENT_AMBULATORY_CARE_PROVIDER_SITE_OTHER): Payer: Medicare Other

## 2021-07-19 DIAGNOSIS — I639 Cerebral infarction, unspecified: Secondary | ICD-10-CM

## 2021-07-19 LAB — CUP PACEART REMOTE DEVICE CHECK
Date Time Interrogation Session: 20220904232849
Implantable Pulse Generator Implant Date: 20200302

## 2021-07-27 NOTE — Progress Notes (Signed)
Carelink Summary Report / Loop Recorder
